# Patient Record
Sex: Male | Born: 1985
Health system: Southern US, Community
[De-identification: ages and names within clinical notes are randomized; demographics above are authoritative.]

## PROBLEM LIST (undated history)

## (undated) DIAGNOSIS — F419 Anxiety disorder, unspecified: Secondary | ICD-10-CM

## (undated) DIAGNOSIS — K219 Gastro-esophageal reflux disease without esophagitis: Secondary | ICD-10-CM

## (undated) DIAGNOSIS — R011 Cardiac murmur, unspecified: Secondary | ICD-10-CM

## (undated) DIAGNOSIS — I639 Cerebral infarction, unspecified: Secondary | ICD-10-CM

## (undated) HISTORY — DX: Gastro-esophageal reflux disease without esophagitis: K21.9

---

## 2009-07-27 ENCOUNTER — Ambulatory Visit: Payer: Self-pay | Admitting: Internal Medicine

## 2019-12-07 DIAGNOSIS — F4320 Adjustment disorder, unspecified: Secondary | ICD-10-CM | POA: Diagnosis not present

## 2019-12-14 DIAGNOSIS — F4320 Adjustment disorder, unspecified: Secondary | ICD-10-CM | POA: Diagnosis not present

## 2020-02-01 DIAGNOSIS — F4320 Adjustment disorder, unspecified: Secondary | ICD-10-CM | POA: Diagnosis not present

## 2020-02-13 DIAGNOSIS — F411 Generalized anxiety disorder: Secondary | ICD-10-CM | POA: Diagnosis not present

## 2020-02-13 DIAGNOSIS — F4322 Adjustment disorder with anxiety: Secondary | ICD-10-CM | POA: Diagnosis not present

## 2020-02-15 DIAGNOSIS — F4320 Adjustment disorder, unspecified: Secondary | ICD-10-CM | POA: Diagnosis not present

## 2020-02-20 DIAGNOSIS — F411 Generalized anxiety disorder: Secondary | ICD-10-CM | POA: Diagnosis not present

## 2020-02-20 DIAGNOSIS — F4322 Adjustment disorder with anxiety: Secondary | ICD-10-CM | POA: Diagnosis not present

## 2020-02-27 DIAGNOSIS — F411 Generalized anxiety disorder: Secondary | ICD-10-CM | POA: Diagnosis not present

## 2020-02-27 DIAGNOSIS — F4322 Adjustment disorder with anxiety: Secondary | ICD-10-CM | POA: Diagnosis not present

## 2020-02-29 DIAGNOSIS — F4320 Adjustment disorder, unspecified: Secondary | ICD-10-CM | POA: Diagnosis not present

## 2020-03-07 DIAGNOSIS — F4320 Adjustment disorder, unspecified: Secondary | ICD-10-CM | POA: Diagnosis not present

## 2020-03-12 DIAGNOSIS — F4322 Adjustment disorder with anxiety: Secondary | ICD-10-CM | POA: Diagnosis not present

## 2020-03-12 DIAGNOSIS — F411 Generalized anxiety disorder: Secondary | ICD-10-CM | POA: Diagnosis not present

## 2020-03-19 DIAGNOSIS — F4322 Adjustment disorder with anxiety: Secondary | ICD-10-CM | POA: Diagnosis not present

## 2020-03-19 DIAGNOSIS — F411 Generalized anxiety disorder: Secondary | ICD-10-CM | POA: Diagnosis not present

## 2020-03-26 DIAGNOSIS — F411 Generalized anxiety disorder: Secondary | ICD-10-CM | POA: Diagnosis not present

## 2020-03-26 DIAGNOSIS — F4322 Adjustment disorder with anxiety: Secondary | ICD-10-CM | POA: Diagnosis not present

## 2020-03-28 DIAGNOSIS — F4320 Adjustment disorder, unspecified: Secondary | ICD-10-CM | POA: Diagnosis not present

## 2020-04-02 DIAGNOSIS — F4322 Adjustment disorder with anxiety: Secondary | ICD-10-CM | POA: Diagnosis not present

## 2020-04-02 DIAGNOSIS — F411 Generalized anxiety disorder: Secondary | ICD-10-CM | POA: Diagnosis not present

## 2020-04-04 DIAGNOSIS — F4320 Adjustment disorder, unspecified: Secondary | ICD-10-CM | POA: Diagnosis not present

## 2020-04-09 DIAGNOSIS — F4322 Adjustment disorder with anxiety: Secondary | ICD-10-CM | POA: Diagnosis not present

## 2020-04-09 DIAGNOSIS — F411 Generalized anxiety disorder: Secondary | ICD-10-CM | POA: Diagnosis not present

## 2020-04-16 DIAGNOSIS — F4322 Adjustment disorder with anxiety: Secondary | ICD-10-CM | POA: Diagnosis not present

## 2020-04-16 DIAGNOSIS — F411 Generalized anxiety disorder: Secondary | ICD-10-CM | POA: Diagnosis not present

## 2020-04-18 DIAGNOSIS — F4320 Adjustment disorder, unspecified: Secondary | ICD-10-CM | POA: Diagnosis not present

## 2020-04-23 DIAGNOSIS — F411 Generalized anxiety disorder: Secondary | ICD-10-CM | POA: Diagnosis not present

## 2020-04-23 DIAGNOSIS — F4322 Adjustment disorder with anxiety: Secondary | ICD-10-CM | POA: Diagnosis not present

## 2020-04-25 DIAGNOSIS — F4320 Adjustment disorder, unspecified: Secondary | ICD-10-CM | POA: Diagnosis not present

## 2020-04-30 DIAGNOSIS — F411 Generalized anxiety disorder: Secondary | ICD-10-CM | POA: Diagnosis not present

## 2020-04-30 DIAGNOSIS — F4322 Adjustment disorder with anxiety: Secondary | ICD-10-CM | POA: Diagnosis not present

## 2020-05-02 DIAGNOSIS — F4320 Adjustment disorder, unspecified: Secondary | ICD-10-CM | POA: Diagnosis not present

## 2020-05-07 DIAGNOSIS — F411 Generalized anxiety disorder: Secondary | ICD-10-CM | POA: Diagnosis not present

## 2020-05-07 DIAGNOSIS — F4322 Adjustment disorder with anxiety: Secondary | ICD-10-CM | POA: Diagnosis not present

## 2020-05-07 DIAGNOSIS — Z23 Encounter for immunization: Secondary | ICD-10-CM | POA: Diagnosis not present

## 2020-05-09 DIAGNOSIS — F4320 Adjustment disorder, unspecified: Secondary | ICD-10-CM | POA: Diagnosis not present

## 2020-05-21 DIAGNOSIS — F4322 Adjustment disorder with anxiety: Secondary | ICD-10-CM | POA: Diagnosis not present

## 2020-05-21 DIAGNOSIS — F411 Generalized anxiety disorder: Secondary | ICD-10-CM | POA: Diagnosis not present

## 2020-05-28 DIAGNOSIS — F411 Generalized anxiety disorder: Secondary | ICD-10-CM | POA: Diagnosis not present

## 2020-05-28 DIAGNOSIS — F4322 Adjustment disorder with anxiety: Secondary | ICD-10-CM | POA: Diagnosis not present

## 2020-05-30 DIAGNOSIS — F4320 Adjustment disorder, unspecified: Secondary | ICD-10-CM | POA: Diagnosis not present

## 2020-06-06 DIAGNOSIS — S62316A Displaced fracture of base of fifth metacarpal bone, right hand, initial encounter for closed fracture: Secondary | ICD-10-CM | POA: Diagnosis not present

## 2020-07-18 DIAGNOSIS — F4320 Adjustment disorder, unspecified: Secondary | ICD-10-CM | POA: Diagnosis not present

## 2020-07-23 DIAGNOSIS — F411 Generalized anxiety disorder: Secondary | ICD-10-CM | POA: Diagnosis not present

## 2020-07-23 DIAGNOSIS — F4322 Adjustment disorder with anxiety: Secondary | ICD-10-CM | POA: Diagnosis not present

## 2020-08-07 DIAGNOSIS — F411 Generalized anxiety disorder: Secondary | ICD-10-CM | POA: Diagnosis not present

## 2020-08-07 DIAGNOSIS — F4322 Adjustment disorder with anxiety: Secondary | ICD-10-CM | POA: Diagnosis not present

## 2020-08-19 DIAGNOSIS — M9903 Segmental and somatic dysfunction of lumbar region: Secondary | ICD-10-CM | POA: Diagnosis not present

## 2020-08-19 DIAGNOSIS — M542 Cervicalgia: Secondary | ICD-10-CM | POA: Diagnosis not present

## 2020-08-19 DIAGNOSIS — M9901 Segmental and somatic dysfunction of cervical region: Secondary | ICD-10-CM | POA: Diagnosis not present

## 2020-08-19 DIAGNOSIS — M9902 Segmental and somatic dysfunction of thoracic region: Secondary | ICD-10-CM | POA: Diagnosis not present

## 2020-08-21 ENCOUNTER — Ambulatory Visit
Admission: EM | Admit: 2020-08-21 | Discharge: 2020-08-21 | Disposition: A | Discharge status: Home / Self Care | Payer: BC Managed Care – PPO | Attending: Emergency Medicine | Admitting: Emergency Medicine

## 2020-08-21 ENCOUNTER — Encounter: Payer: Self-pay | Admitting: Emergency Medicine

## 2020-08-21 ENCOUNTER — Other Ambulatory Visit: Payer: Self-pay

## 2020-08-21 DIAGNOSIS — B349 Viral infection, unspecified: Secondary | ICD-10-CM | POA: Diagnosis not present

## 2020-08-21 DIAGNOSIS — F411 Generalized anxiety disorder: Secondary | ICD-10-CM | POA: Diagnosis not present

## 2020-08-21 DIAGNOSIS — F4322 Adjustment disorder with anxiety: Secondary | ICD-10-CM | POA: Diagnosis not present

## 2020-08-21 NOTE — ED Provider Notes (Signed)
CHIEF COMPLAINT:   Chief Complaint  Patient presents with   Fever   Cough     SUBJECTIVE/HPI:   Fever Associated symptoms: cough   Cough Associated symptoms: fever   A very pleasant 35 y.o.Male presents today with cough and fever for the last 3 to 4 days.  Patient reports a 101 F temperature at home.  Patient states that on Sunday he smoked a cigar and has felt some throat irritation since.  Patient states that on Monday he had a mild headache, but states that yesterday his symptoms were better.  Patient states that on Monday and Tuesday he took at home COVID test which were negative.  Patient has used Sudafed and Tylenol with some relief of symptoms.  Patient reports that he has a 62-week-old baby at home and he is concerned for his symptoms. Patient does not report any shortness of breath, chest pain, palpitations, visual changes, weakness, tingling, headache, nausea, vomiting, diarrhea.   has no past medical history on file.  ROS:  Review of Systems  Constitutional:  Positive for fever.  Respiratory:  Positive for cough.   See Subjective/HPI Medications, Allergies and Problem List personally reviewed in Epic today OBJECTIVE:   Vitals:   08/21/20 1550  BP: 124/90  Pulse: 82  Resp: (!) 21  Temp: 98.5 F (36.9 C)  SpO2: 96%    Physical Exam   General: Appears well-developed and well-nourished. No acute distress.  HEENT Head: Normocephalic and atraumatic.   Ears: Hearing grossly intact, no drainage or visible deformity.  Nose: No nasal deviation.   Mouth/Throat: No stridor or tracheal deviation.  Mildly erythematous posterior pharynx noted with clear drainage present.  No white patchy exudate noted. Eyes: Conjunctivae and EOM are normal. No eye drainage or scleral icterus bilaterally.  Neck: Normal range of motion, neck is supple. No cervical, tonsillar or submandibular lymph nodes palpated.   Cardiovascular: Normal rate. Regular rhythm; no murmurs, gallops, or rubs.   Pulm/Chest: No respiratory distress. Breath sounds normal bilaterally without wheezes, rhonchi, or rales.  Neurological: Alert and oriented to person, place, and time.  Skin: Skin is warm and dry.  No rashes, lesions, abrasions or bruising noted to skin.   Psychiatric: Normal mood, affect, behavior, and thought content.   Vital signs and nursing note reviewed.   Patient stable and cooperative with examination. PROCEDURES:    LABS/X-RAYS/EKG/MEDS:   No results found for any visits on 08/21/20.  MEDICAL DECISION MAKING:   Patient presents with cough and fever for the last 3 to 4 days.  Patient reports a 101 F temperature at home.  Patient states that on Sunday he smoked a cigar and has felt some throat irritation since.  Patient states that on Monday he had a mild headache, but states that yesterday his symptoms were better.  Patient states that on Monday and Tuesday he took at home COVID test which were negative.  Patient has used Sudafed and Tylenol with some relief of symptoms.  Patient reports that he has a 8-week-old baby at home and he is concerned for his symptoms. Patient does not report any shortness of breath, chest pain, palpitations, visual changes, weakness, tingling, headache, nausea, vomiting, diarrhea.  Chart review completed.  Given symptoms along with assessment findings, likely viral illness.  COVID-19 PCR obtained in clinic today.  Advised the patient that we will call with any positive results and negative results will go directly to his MyChart.  Advised about home treatment and care as outlined in  his AVS.  Return as needed.  Patient verbalized understanding and agreed with treatment plan.  Patient stable upon discharge. ASSESSMENT/PLAN:  1. Viral illness - Novel Coronavirus, NAA ([facility]); Standing - Novel Coronavirus, NAA ([facility])  No orders of the defined types were placed in this encounter.   Instructions about new medications and side effects  provided.  Plan:   Discharge Instructions      We will call you with any positive results from your COVID-19 testing completed in clinic today.  If you do not receive a phone call from [place] within the next 2-3 days, check your MyChart for up-to-date health information related to testing completed in clinic today.   For most people this is a self-limiting process and can take anywhere from 7 - 10 days to start feeling better. A cough can last up to 3 weeks. Pay special attention to handwashing as this can help prevent the spread of the virus.   Always read the labels of cough and cold medications as they may contain some of the ingredients below.  Rest, push lots of fluids (especially water), and utilize supportive care for symptoms. You may take acetaminophen (Tylenol) every 4-6 hours and ibuprofen every 6-8 hours for muscle pain, joint pain, headaches (you may also alternate these medications). Mucinex (guaifenesin) may be taken over the counter for cough as needed can loosen phlegm. Please read the instructions and take as directed.  Sudafed (pseudophedrine) is sold behind the counter and can help reduce nasal pressure; avoid taking this if you have high blood pressure or feel jittery. Sudafed PE (phenylephrine) can be a helpful, short-term, over-the-counter alternative to limit side effects or if you have high blood pressure.  Flonase nasal spray can help alleviate congestion and sinus pressure. Many patients choose Afrin as a nasal decongestant; do not use for more than 3 days for risk of rebound (increased symptoms after stopping medication).  Saline nasal sprays or rinses can also help nasal congestion (use bottled or sterile water). Warm tea with lemon and honey can sooth sore throat and cough, as can cough drops.   Return to clinic for high fever not improving with medications, chest pain, difficulty breathing, non-stop vomiting, or coughing blood. Follow-up with your primary care  provider if symptoms do not improve as expected in the next 5-7 days.        A copy of these instructions have been given to the patient or responsible adult who demonstrated the ability to learn, asked appropriate questions, and verbalized understanding of the plan of care.  There were no barriers to learning identified.    Amalia Greenhouse, FNP-C 08/21/20  This note was partially made with the aid of speech-to-text dictation; typographical errors are not intentional.    Amalia Greenhouse, FNP 08/21/20 1639

## 2020-08-21 NOTE — Discharge Instructions (Addendum)

## 2020-08-21 NOTE — ED Triage Notes (Signed)
Patient c/o fever and productive cough w/ " clear to green" sputum  x 3-4 days.   Patient endorses a temperature of 101 F at home at it's highest.   Patient endorses onset of symptoms began after "smoking a cigar on Sunday and I hadn't done that in a while, then my throat started bothering me".   Patient reports taking 2 at home COVID test with negative test results.   Patient has taken Sudafed and Tylenol with some relief of symptoms.   Patient denies SOB.

## 2020-08-22 LAB — NOVEL CORONAVIRUS, NAA: SARS-CoV-2, NAA: NOT DETECTED

## 2020-08-22 LAB — SARS-COV-2, NAA 2 DAY TAT

## 2020-08-28 DIAGNOSIS — M542 Cervicalgia: Secondary | ICD-10-CM | POA: Diagnosis not present

## 2020-08-28 DIAGNOSIS — M9902 Segmental and somatic dysfunction of thoracic region: Secondary | ICD-10-CM | POA: Diagnosis not present

## 2020-08-28 DIAGNOSIS — M9903 Segmental and somatic dysfunction of lumbar region: Secondary | ICD-10-CM | POA: Diagnosis not present

## 2020-08-28 DIAGNOSIS — M9901 Segmental and somatic dysfunction of cervical region: Secondary | ICD-10-CM | POA: Diagnosis not present

## 2020-08-29 DIAGNOSIS — M9901 Segmental and somatic dysfunction of cervical region: Secondary | ICD-10-CM | POA: Diagnosis not present

## 2020-08-29 DIAGNOSIS — M9902 Segmental and somatic dysfunction of thoracic region: Secondary | ICD-10-CM | POA: Diagnosis not present

## 2020-08-29 DIAGNOSIS — M9903 Segmental and somatic dysfunction of lumbar region: Secondary | ICD-10-CM | POA: Diagnosis not present

## 2020-08-29 DIAGNOSIS — M542 Cervicalgia: Secondary | ICD-10-CM | POA: Diagnosis not present

## 2020-09-02 DIAGNOSIS — M542 Cervicalgia: Secondary | ICD-10-CM | POA: Diagnosis not present

## 2020-09-02 DIAGNOSIS — M9902 Segmental and somatic dysfunction of thoracic region: Secondary | ICD-10-CM | POA: Diagnosis not present

## 2020-09-02 DIAGNOSIS — M9901 Segmental and somatic dysfunction of cervical region: Secondary | ICD-10-CM | POA: Diagnosis not present

## 2020-09-02 DIAGNOSIS — M9903 Segmental and somatic dysfunction of lumbar region: Secondary | ICD-10-CM | POA: Diagnosis not present

## 2020-09-03 DIAGNOSIS — M9901 Segmental and somatic dysfunction of cervical region: Secondary | ICD-10-CM | POA: Diagnosis not present

## 2020-09-03 DIAGNOSIS — M9903 Segmental and somatic dysfunction of lumbar region: Secondary | ICD-10-CM | POA: Diagnosis not present

## 2020-09-03 DIAGNOSIS — M542 Cervicalgia: Secondary | ICD-10-CM | POA: Diagnosis not present

## 2020-09-03 DIAGNOSIS — M9902 Segmental and somatic dysfunction of thoracic region: Secondary | ICD-10-CM | POA: Diagnosis not present

## 2020-09-04 DIAGNOSIS — F4322 Adjustment disorder with anxiety: Secondary | ICD-10-CM | POA: Diagnosis not present

## 2020-09-04 DIAGNOSIS — F411 Generalized anxiety disorder: Secondary | ICD-10-CM | POA: Diagnosis not present

## 2020-09-05 DIAGNOSIS — M9903 Segmental and somatic dysfunction of lumbar region: Secondary | ICD-10-CM | POA: Diagnosis not present

## 2020-09-05 DIAGNOSIS — M9902 Segmental and somatic dysfunction of thoracic region: Secondary | ICD-10-CM | POA: Diagnosis not present

## 2020-09-05 DIAGNOSIS — S161XXA Strain of muscle, fascia and tendon at neck level, initial encounter: Secondary | ICD-10-CM | POA: Diagnosis not present

## 2020-09-05 DIAGNOSIS — M542 Cervicalgia: Secondary | ICD-10-CM | POA: Diagnosis not present

## 2020-09-05 DIAGNOSIS — M9901 Segmental and somatic dysfunction of cervical region: Secondary | ICD-10-CM | POA: Diagnosis not present

## 2020-09-06 DIAGNOSIS — M542 Cervicalgia: Secondary | ICD-10-CM | POA: Diagnosis not present

## 2020-09-09 ENCOUNTER — Inpatient Hospital Stay
Admission: EM | Admit: 2020-09-09 | Discharge: 2020-09-13 | DRG: 064 | Disposition: A | Discharge status: Rehabilitation Facility | Payer: BC Managed Care – PPO | Attending: Internal Medicine | Admitting: Internal Medicine

## 2020-09-09 ENCOUNTER — Other Ambulatory Visit: Payer: Self-pay

## 2020-09-09 ENCOUNTER — Emergency Department: Payer: BC Managed Care – PPO

## 2020-09-09 ENCOUNTER — Encounter: Payer: Self-pay | Admitting: Emergency Medicine

## 2020-09-09 DIAGNOSIS — I10 Essential (primary) hypertension: Secondary | ICD-10-CM | POA: Diagnosis not present

## 2020-09-09 DIAGNOSIS — I6503 Occlusion and stenosis of bilateral vertebral arteries: Secondary | ICD-10-CM | POA: Diagnosis not present

## 2020-09-09 DIAGNOSIS — R0602 Shortness of breath: Secondary | ICD-10-CM

## 2020-09-09 DIAGNOSIS — R270 Ataxia, unspecified: Secondary | ICD-10-CM | POA: Diagnosis not present

## 2020-09-09 DIAGNOSIS — Q278 Other specified congenital malformations of peripheral vascular system: Secondary | ICD-10-CM | POA: Diagnosis not present

## 2020-09-09 DIAGNOSIS — Z8679 Personal history of other diseases of the circulatory system: Secondary | ICD-10-CM | POA: Diagnosis not present

## 2020-09-09 DIAGNOSIS — R29818 Other symptoms and signs involving the nervous system: Secondary | ICD-10-CM | POA: Diagnosis not present

## 2020-09-09 DIAGNOSIS — K219 Gastro-esophageal reflux disease without esophagitis: Secondary | ICD-10-CM | POA: Diagnosis not present

## 2020-09-09 DIAGNOSIS — R079 Chest pain, unspecified: Secondary | ICD-10-CM | POA: Diagnosis not present

## 2020-09-09 DIAGNOSIS — I7774 Dissection of vertebral artery: Secondary | ICD-10-CM

## 2020-09-09 DIAGNOSIS — H5500 Unspecified nystagmus: Secondary | ICD-10-CM | POA: Diagnosis present

## 2020-09-09 DIAGNOSIS — E669 Obesity, unspecified: Secondary | ICD-10-CM | POA: Diagnosis present

## 2020-09-09 DIAGNOSIS — I119 Hypertensive heart disease without heart failure: Secondary | ICD-10-CM | POA: Diagnosis not present

## 2020-09-09 DIAGNOSIS — Z6836 Body mass index (BMI) 36.0-36.9, adult: Secondary | ICD-10-CM | POA: Diagnosis not present

## 2020-09-09 DIAGNOSIS — I639 Cerebral infarction, unspecified: Secondary | ICD-10-CM | POA: Diagnosis not present

## 2020-09-09 DIAGNOSIS — M50123 Cervical disc disorder at C6-C7 level with radiculopathy: Secondary | ICD-10-CM | POA: Diagnosis not present

## 2020-09-09 DIAGNOSIS — R519 Headache, unspecified: Secondary | ICD-10-CM | POA: Diagnosis not present

## 2020-09-09 DIAGNOSIS — Z20822 Contact with and (suspected) exposure to covid-19: Secondary | ICD-10-CM | POA: Diagnosis present

## 2020-09-09 DIAGNOSIS — D72829 Elevated white blood cell count, unspecified: Secondary | ICD-10-CM | POA: Diagnosis not present

## 2020-09-09 DIAGNOSIS — R200 Anesthesia of skin: Secondary | ICD-10-CM | POA: Diagnosis not present

## 2020-09-09 DIAGNOSIS — R001 Bradycardia, unspecified: Secondary | ICD-10-CM | POA: Diagnosis not present

## 2020-09-09 DIAGNOSIS — I517 Cardiomegaly: Secondary | ICD-10-CM | POA: Diagnosis not present

## 2020-09-09 DIAGNOSIS — F17290 Nicotine dependence, other tobacco product, uncomplicated: Secondary | ICD-10-CM | POA: Diagnosis present

## 2020-09-09 DIAGNOSIS — I6389 Other cerebral infarction: Secondary | ICD-10-CM | POA: Diagnosis not present

## 2020-09-09 DIAGNOSIS — I726 Aneurysm of vertebral artery: Secondary | ICD-10-CM | POA: Diagnosis not present

## 2020-09-09 DIAGNOSIS — I6502 Occlusion and stenosis of left vertebral artery: Secondary | ICD-10-CM

## 2020-09-09 DIAGNOSIS — I63212 Cerebral infarction due to unspecified occlusion or stenosis of left vertebral arteries: Secondary | ICD-10-CM | POA: Diagnosis not present

## 2020-09-09 DIAGNOSIS — G8324 Monoplegia of upper limb affecting left nondominant side: Secondary | ICD-10-CM | POA: Diagnosis not present

## 2020-09-09 DIAGNOSIS — G464 Cerebellar stroke syndrome: Secondary | ICD-10-CM

## 2020-09-09 DIAGNOSIS — H532 Diplopia: Secondary | ICD-10-CM | POA: Diagnosis not present

## 2020-09-09 DIAGNOSIS — R29702 NIHSS score 2: Secondary | ICD-10-CM | POA: Diagnosis present

## 2020-09-09 DIAGNOSIS — R42 Dizziness and giddiness: Secondary | ICD-10-CM | POA: Diagnosis not present

## 2020-09-09 DIAGNOSIS — R531 Weakness: Secondary | ICD-10-CM | POA: Diagnosis not present

## 2020-09-09 DIAGNOSIS — J329 Chronic sinusitis, unspecified: Secondary | ICD-10-CM | POA: Diagnosis not present

## 2020-09-09 HISTORY — DX: Dissection of vertebral artery: I77.74

## 2020-09-09 HISTORY — DX: Cerebral infarction, unspecified: I63.9

## 2020-09-09 LAB — TROPONIN I (HIGH SENSITIVITY): Troponin I (High Sensitivity): 3 ng/L (ref <18)

## 2020-09-09 LAB — HIV ANTIBODY (ROUTINE TESTING W REFLEX): HIV Screen 4th Generation wRfx: NONREACTIVE

## 2020-09-09 LAB — COMPREHENSIVE METABOLIC PANEL
ALT: 15 U/L (ref 0–44)
AST: 15 U/L (ref 15–41)
Albumin: 4.1 g/dL (ref 3.5–5.0)
Alkaline Phosphatase: 72 U/L (ref 38–126)
Anion gap: 8 (ref 5–15)
BUN: 20 mg/dL (ref 6–20)
CO2: 26 mmol/L (ref 22–32)
Calcium: 9 mg/dL (ref 8.9–10.3)
Chloride: 106 mmol/L (ref 98–111)
Creatinine, Ser: 0.85 mg/dL (ref 0.61–1.24)
GFR, Estimated: >60 mL/min (ref >60)
Glucose, Bld: 144 mg/dL — ABNORMAL HIGH (ref 70–99)
Potassium: 3.8 mmol/L (ref 3.5–5.1)
Sodium: 140 mmol/L (ref 135–145)
Total Bilirubin: 0.9 mg/dL (ref 0.3–1.2)
Total Protein: 7.2 g/dL (ref 6.5–8.1)

## 2020-09-09 LAB — CBC
HCT: 49.1 % (ref 39.0–52.0)
Hemoglobin: 17 g/dL (ref 13.0–17.0)
MCH: 29.7 pg (ref 26.0–34.0)
MCHC: 34.6 g/dL (ref 30.0–36.0)
MCV: 85.8 fL (ref 80.0–100.0)
Platelets: 327 10*3/uL (ref 150–400)
RBC: 5.72 MIL/uL (ref 4.22–5.81)
RDW: 12.1 % (ref 11.5–15.5)
WBC: 18.4 10*3/uL — ABNORMAL HIGH (ref 4.0–10.5)
nRBC: 0 % (ref 0.0–0.2)

## 2020-09-09 LAB — URINALYSIS, COMPLETE (UACMP) WITH MICROSCOPIC
Bacteria, UA: NONE SEEN
Bilirubin Urine: NEGATIVE
Glucose, UA: NEGATIVE mg/dL
Hgb urine dipstick: NEGATIVE
Ketones, ur: NEGATIVE mg/dL
Leukocytes,Ua: NEGATIVE
Nitrite: NEGATIVE
Protein, ur: NEGATIVE mg/dL
Specific Gravity, Urine: 1.032 — ABNORMAL HIGH (ref 1.005–1.030)
pH: 7 (ref 5.0–8.0)

## 2020-09-09 LAB — CBG MONITORING, ED
Glucose-Capillary: 150 mg/dL — ABNORMAL HIGH (ref 70–99)
Glucose-Capillary: 129 mg/dL — ABNORMAL HIGH (ref 70–99)

## 2020-09-09 LAB — HEMOGLOBIN A1C
Hgb A1c MFr Bld: 5.5 % (ref 4.8–5.6)
Mean Plasma Glucose: 111.15 mg/dL

## 2020-09-09 IMAGING — MR MR HEAD W/O CM
13 series · 48 of 48 positions shown · non-contrast
Comparison: None.

CLINICAL DATA: Neuro deficit, acute, stroke suspected; dizziness
and weakness after chiropractor visit; abnormal CTA

EXAM:
MRI HEAD WITHOUT CONTRAST
TECHNIQUE: Multiplanar, multiecho pulse sequences of the brain and surrounding
structures were obtained without intravenous contrast.

[Series 5: ax dwi_tracew · axial · 3.0mm · 1.80mm/px · z∈[-75,+80]mm · 2 of 48 slices shown]
[im 1/48]
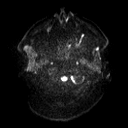
[im 48/48]
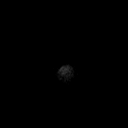

[Series 6: ax dwi_adc · axial · 3.0mm · 1.80mm/px · z∈[-75,+80]mm · 2 of 48 slices shown]
[im 1/48]
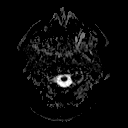
[im 48/48]
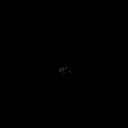

[Series 7: cor dwi_tracew · coronal · 5.0mm · 1.80mm/px · 3 of 38 slices shown]
[im 1/38]
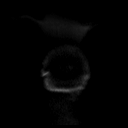
[im 19/38]
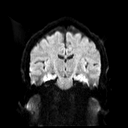
[im 38/38]
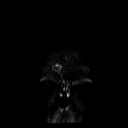

[Series 8: cor dwi_adc · coronal · 5.0mm · 1.80mm/px · 3 of 38 slices shown]
[im 1/38]
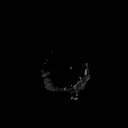
[im 19/38]
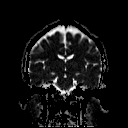
[im 38/38]
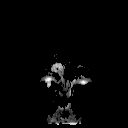

[Series 9: T1 · sagittal · 5.0mm · 0.62mm/px · 2 of 25 slices shown (1 of 2)]
[im 1/25]
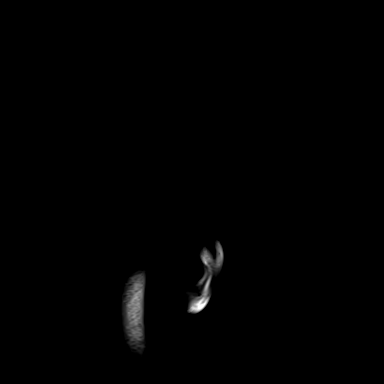
[im 25/25]
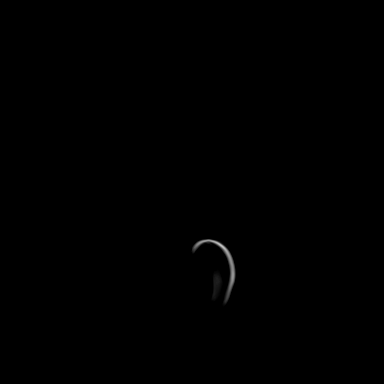

[Series 10: T2 · axial · 5.0mm · 0.53mm/px · z∈[-71,+72]mm · 2 of 25 slices shown (1 of 2)]
[im 1/25]
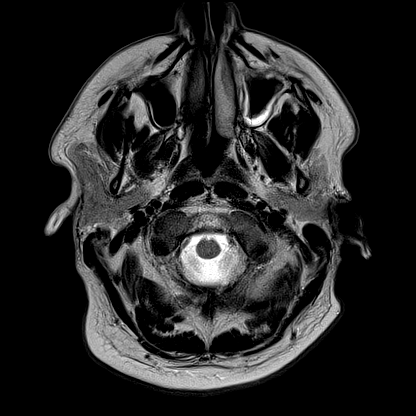
[im 25/25]
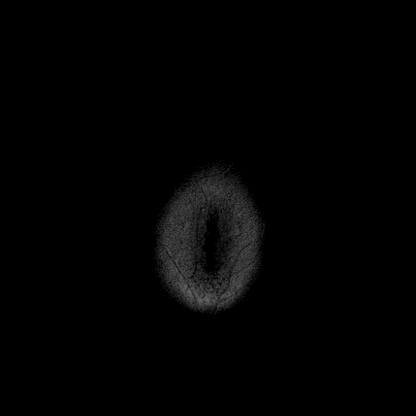

[Series 11: mag_images · axial · 3.0mm · 0.90mm/px · z∈[-86,+89]mm · 4 of 60 slices shown]
[im 1/60]
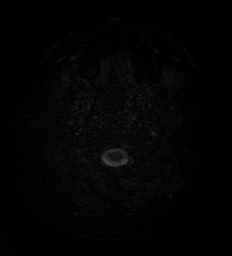
[im 20/60]
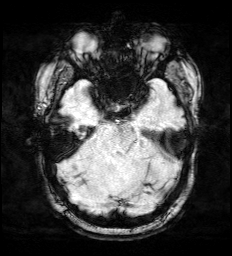
[im 40/60]
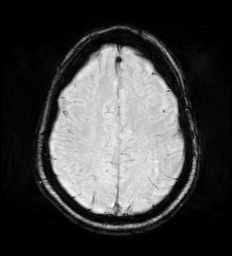
[im 60/60]
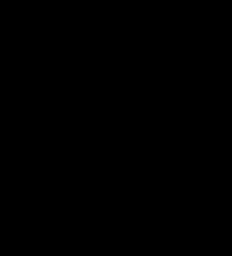

[Series 12: pha_images · axial · 3.0mm · 0.90mm/px · z∈[-86,+89]mm · 4 of 60 slices shown]
[im 1/60]
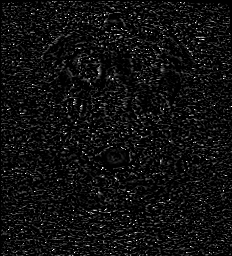
[im 20/60]
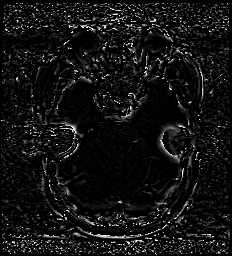
[im 40/60]
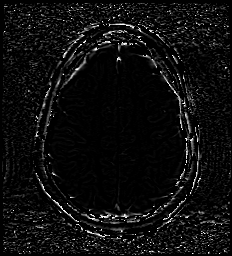
[im 60/60]
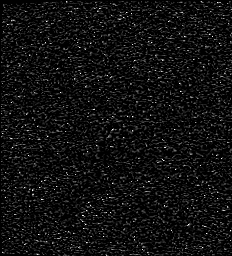

[Series 13: swi_images · axial · 3.0mm · 0.90mm/px · z∈[-86,+89]mm · 4 of 60 slices shown]
[im 1/60]
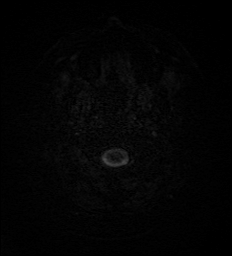
[im 20/60]
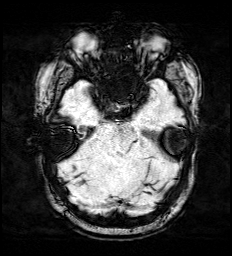
[im 40/60]
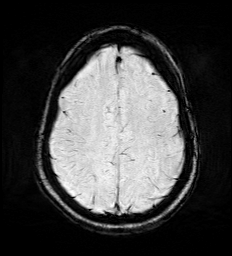
[im 60/60]
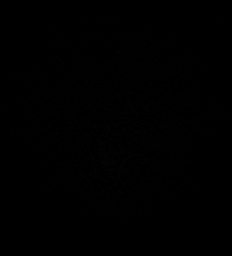

[Series 14: mip_images(sw) · axial · 24.0mm · 0.90mm/px · z∈[-76,+79]mm · 4 of 53 slices shown]
[im 1/53]
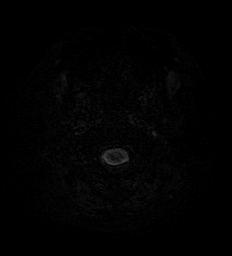
[im 18/53]
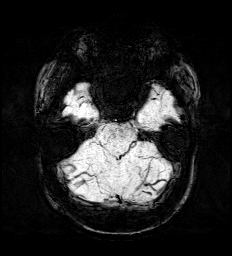
[im 35/53]
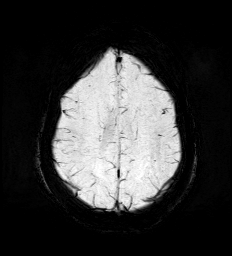
[im 53/53]
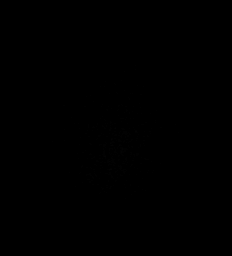

[Series 15: FLAIR · axial · 3.0mm · 0.53mm/px · z∈[-80,+81]mm · 4 of 55 slices shown]
[im 1/55]
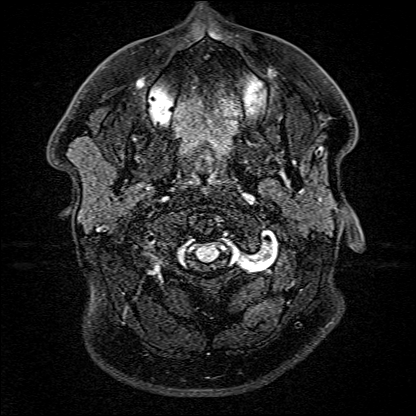
[im 19/55]
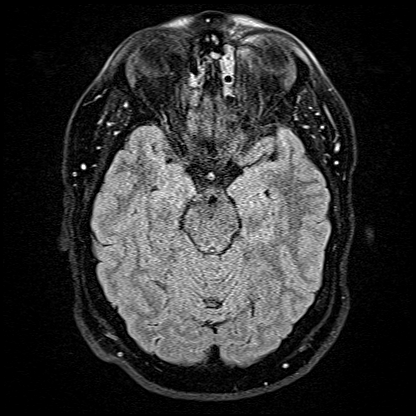
[im 37/55]
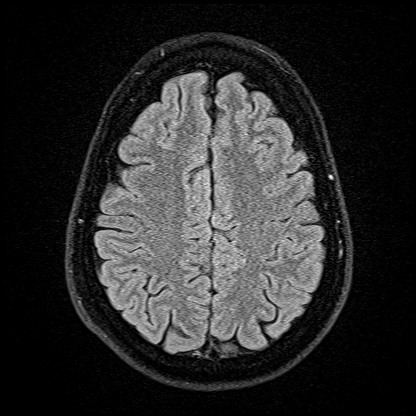
[im 55/55]
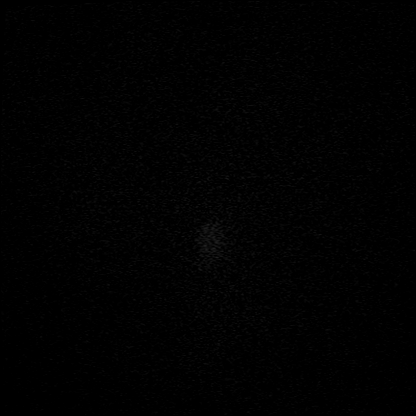

[Series 16: T1 · axial · 1.0mm · 0.98mm/px · z∈[-92,+81]mm · 12 of 172 slices shown (2 of 2)]
[im 1/172]
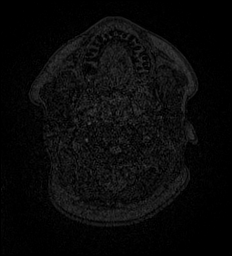
[im 16/172]
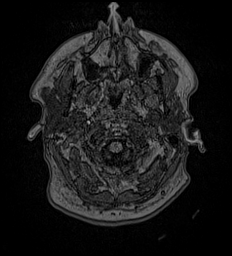
[im 32/172]
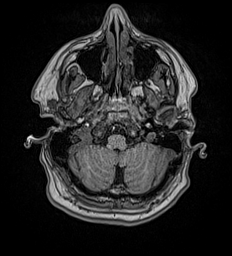
[im 47/172]
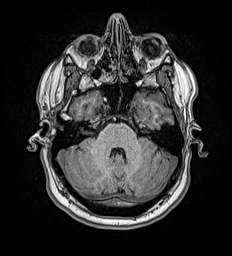
[im 63/172]
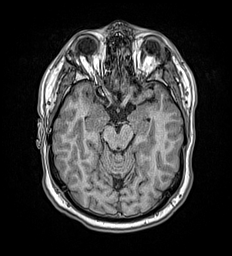
[im 78/172]
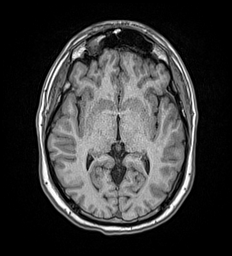
[im 94/172]
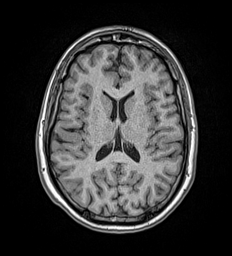
[im 109/172]
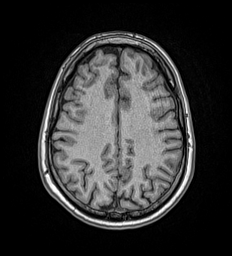
[im 125/172]
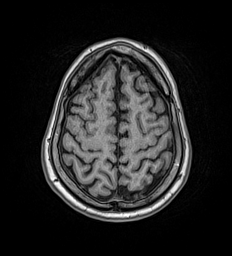
[im 140/172]
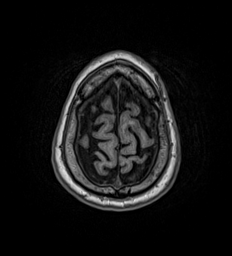
[im 156/172]
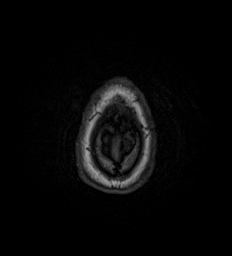
[im 172/172]
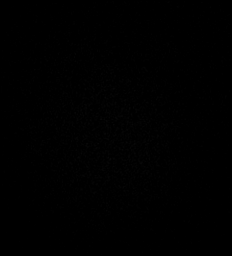

[Series 17: T2 · coronal · 5.0mm · 0.45mm/px · 2 of 31 slices shown (2 of 2)]
[im 1/31]
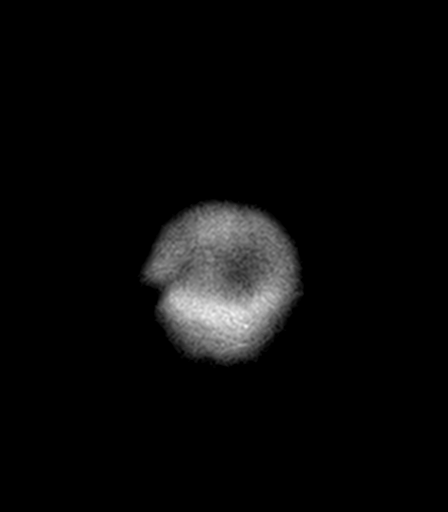
[im 31/31]
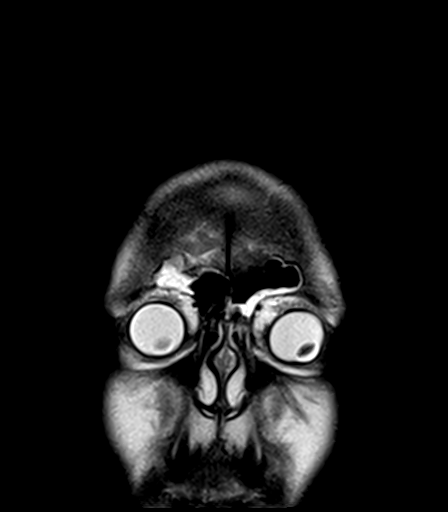

[48 of 48 positions shown; findings below may reference images not displayed]

FINDINGS: Brain: Focal reduced diffusion at the left lateral aspect of the
medulla. Ventricles and sulci are normal in size and configuration.
A few small foci of T2 hyperintensity in the cerebral white matter
likely reflect nonspecific gliosis/demyelination of doubtful
clinical significance. There is no evidence of intracranial
hemorrhage. There is no intracranial mass, mass effect, or edema.
There is no hydrocephalus or extra-axial fluid collection.

Vascular: Major vessel flow voids at the skull base are preserved.

Skull and upper cervical spine: Normal marrow signal is preserved.

Sinuses/Orbits: Paranasal sinuses are aerated. Orbits are
unremarkable.

Other: Sella is unremarkable.  Mastoid air cells are clear.
IMPRESSION: Small acute stroke of the lateral left medulla.

These results were called by telephone at the time of interpretation
on [DATE] at [DATE] to provider JINIA , who verbally
acknowledged these results.

## 2020-09-09 IMAGING — CT CT ANGIO HEAD-NECK (W OR W/O PERF)
2 of 10 series · 7 of 35 positions shown · IV contrast (omnipaque)
Comparison: None

CLINICAL DATA: Provided history: Dizziness, persistent/recurrent,
cardiac or vascular cause suspected; left sided neck pain s/p
chiropractic adjustment with HA.

EXAM:
CT ANGIOGRAPHY HEAD AND NECK
TECHNIQUE: Multidetector CT imaging of the head and neck was performed using
the standard protocol during bolus administration of intravenous
contrast. Multiplanar CT image reconstructions and MIPs were
obtained to evaluate the vascular anatomy. Carotid stenosis
measurements (when applicable) are obtained utilizing NASCET
criteria, using the distal internal carotid diameter as the
denominator.
CONTRAST:  75mL OMNIPAQUE IOHEXOL 350 MG/ML SOLN

[Series 7: ax thin · axial · 0.50mm/px · z∈[-58,+196]mm · 6 of 357 slices shown]
[im 51/357  soft-tissue]
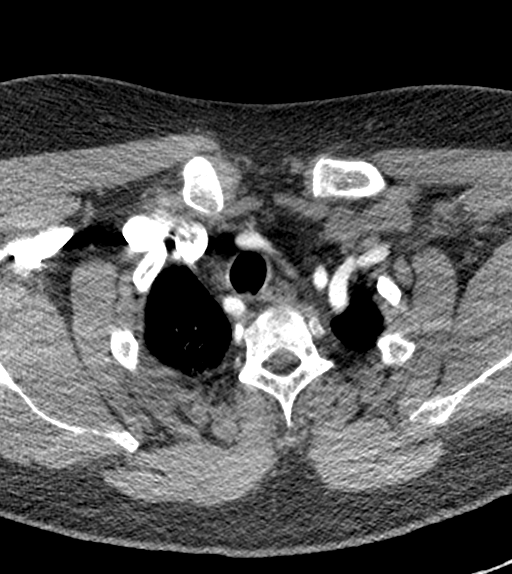
[im 102/357  bone]
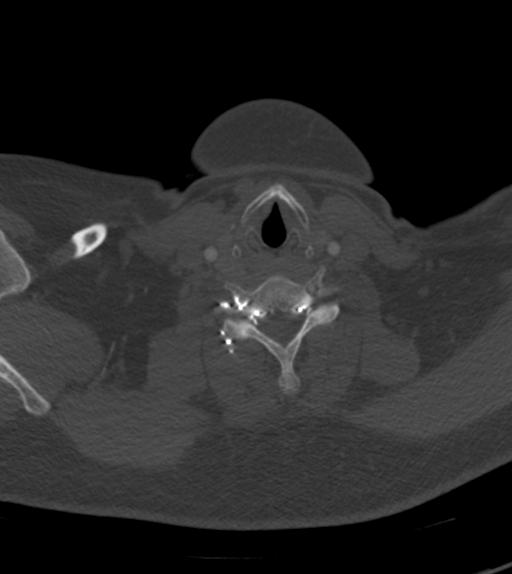
[im 153/357  soft-tissue]
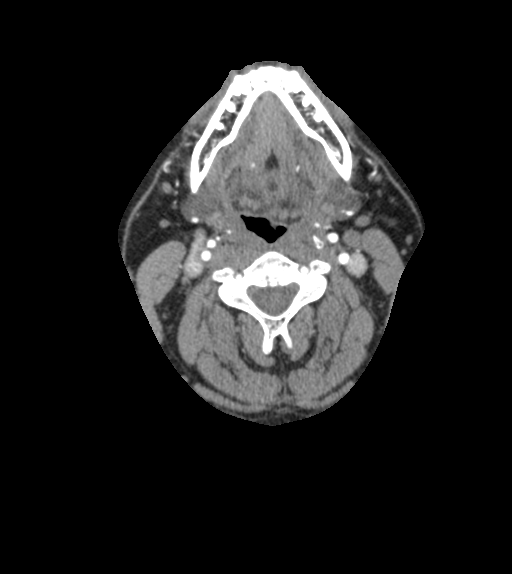
[im 204/357  bone]
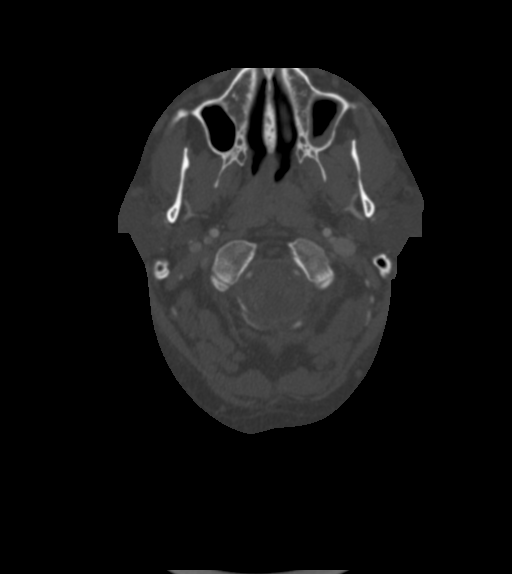
[im 255/357  soft-tissue]
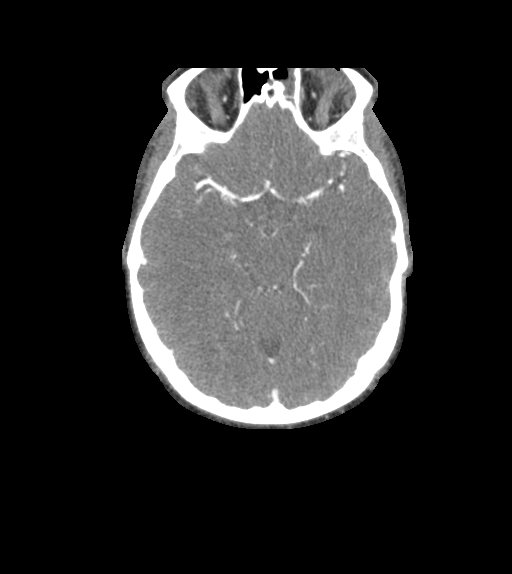
[im 306/357  bone]
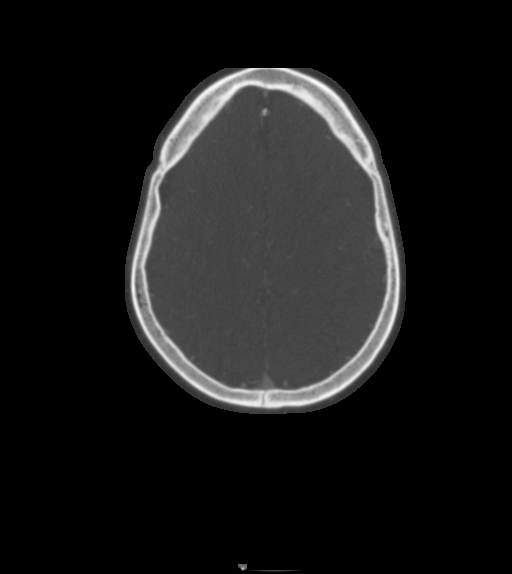

[Series 9: sagittal thin · sagittal · 0.56mm/px · 1 of 267 slices shown]
[im 127/267  soft-tissue]
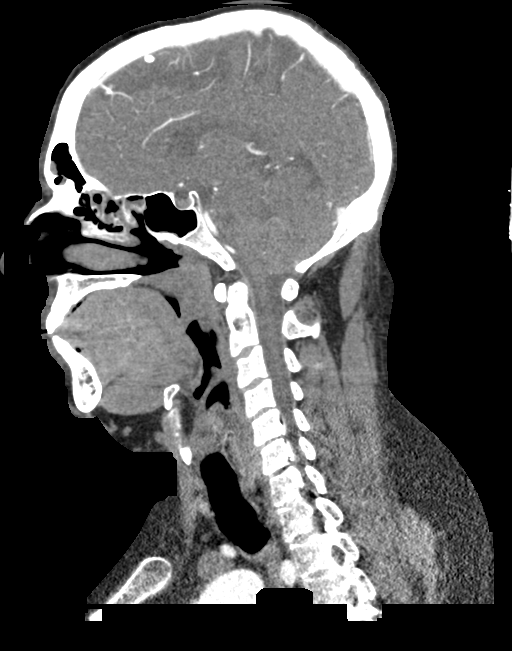

[7 of 35 positions shown; findings below may reference images not displayed]

FINDINGS: CT HEAD FINDINGS

Brain:

Cerebral volume is normal.

There is no acute intracranial hemorrhage.

No demarcated cortical infarct.

No extra-axial fluid collection.

No evidence of an intracranial mass.

No midline shift.

Vascular: No hyperdense vessel.

Skull: Normal. Negative for fracture or focal lesion.

Sinuses: Moderate partial opacification of the right frontal sinus.
Mild-to-moderate mucosal thickening within the left frontal sinus.
Moderate partial opacification of the bilateral ethmoid air cells,
likely due to the presence of mucosal thickening and fluid. Mild
left maxillary sinus mucosal thickening.

Orbits: No mass or acute finding.

Review of the MIP images confirms the above findings

CTA NECK FINDINGS

Aortic arch: Aberrant right subclavian artery. The origin of the
right subclavian artery is incompletely included in the field of
view. The visualized aortic arch is normal in caliber. No
hemodynamically significant stenosis within the proximal subclavian
arteries.

Right carotid system: CCA and ICA patent within the neck without
stenosis or significant atherosclerotic disease. No evidence of
dissection.

Left carotid system: CCA and ICA patent within the neck without
stenosis or significant atherosclerotic disease. No evidence of
dissection

Vertebral arteries: Venous contrast reflux limits evaluation of the
V1 and proximal V2 segments of the right vertebral artery. Within
this limitation, the right vertebral artery is patent within the
neck without stenosis or evidence of dissection. The left vertebral
artery is patent within the neck. However, there are sites of
irregularity and mild to moderate narrowing within the V2 left
vertebral artery highly suspicious for sequela of dissection given
the provided history (for instance as seen on series 7, image 137).

Skeleton: No acute bony abnormality or aggressive osseous lesion.

Other neck: No neck mass or cervical lymphadenopathy.

Upper chest: No consolidation within the imaged lung apices.

Review of the MIP images confirms the above findings

CTA HEAD FINDINGS

Anterior circulation:

The intracranial internal carotid arteries are patent. The M1 middle
cerebral arteries are patent. No M2 proximal branch occlusion or
high-grade proximal stenosis is identified. The anterior cerebral
arteries are patent. No intracranial aneurysm is identified.

Posterior circulation:

The intracranial right vertebral artery is patent without stenosis.
There is irregularity and sites of up to moderate/severe narrowing
within the V4 left vertebral artery, highly suspicious for
dissection given the provided history (for instance as seen on
series 7, images 211 and 212) (series 8, images 131 and 132). The
basilar artery is patent. The posterior cerebral arteries are
patent. The posterior communicating arteries are hypoplastic or
absent bilaterally.

Venous sinuses: Within the limitations of contrast timing, no
convincing thrombus.

Anatomic variants: As described.

Review of the MIP images confirms the above findings

These results were called by telephone at the time of interpretation
on [DATE] at [DATE] to provider EMIR , who verbally
acknowledged these results.
IMPRESSION: CT head:

1. No evidence of acute intracranial abnormality.
2. Paranasal sinus disease, as described. Correlate for acute
sinusitis.

CTA head/neck:

1. Irregularity of the V2 segment of the left vertebral artery with
sites of up to mild/moderate stenosis. Irregularity of the V4 left
vertebral artery with sites of up to moderate/severe stenosis. These
findings are highly suspicious for left vertebral artery dissection
given the provided history. A brain MRI is recommended to assess for
acute intracranial ischemia.
2. Elsewhere within the head and neck, there is no large vessel
occlusion or proximal high-grade arterial stenosis.
3. Aberrant right subclavian artery. The right subclavian artery
origin is incompletely included in the field of view.

## 2020-09-09 IMAGING — MR MR CERVICAL SPINE W/O CM
5 series · 41 of 48 positions shown · non-contrast
Comparison: None.

CLINICAL DATA: Cervical radiculopathy caudal right flecks; numbness
face and neck after chiropractor adjustment

EXAM:
MRI CERVICAL SPINE WITHOUT CONTRAST
TECHNIQUE: Multiplanar, multisequence MR imaging of the cervical spine was
performed. No intravenous contrast was administered.

[Series 5: T2 · sagittal · 3.0mm · 0.62mm/px · 6 of 15 slices shown (1 of 2)]
[im 1/15]
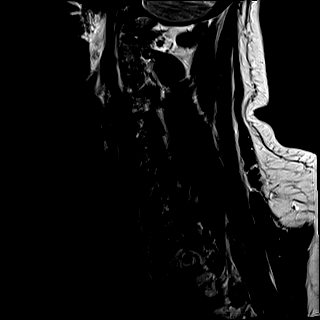
[im 3/15]
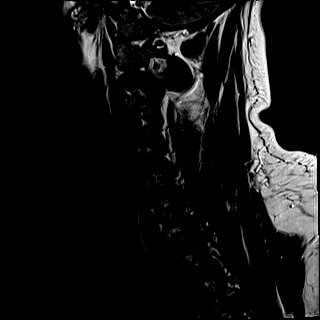
[im 6/15]
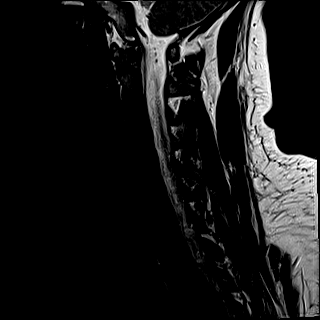
[im 9/15]
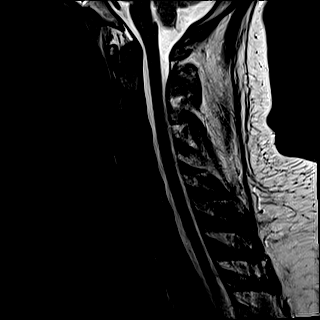
[im 12/15]
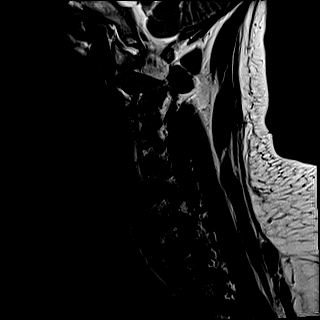
[im 15/15]
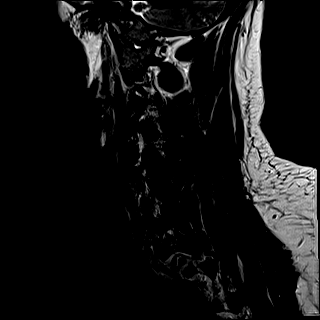

[Series 6: FLAIR · sagittal · 3.0mm · 0.78mm/px · 7 of 15 slices shown]
[im 1/15]
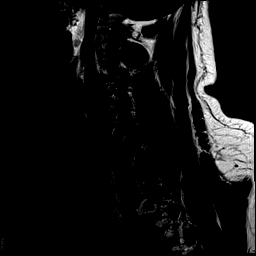
[im 3/15]
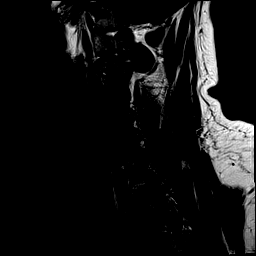
[im 5/15]
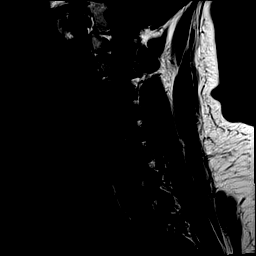
[im 8/15]
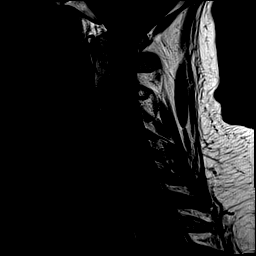
[im 10/15]
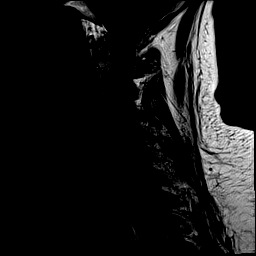
[im 12/15]
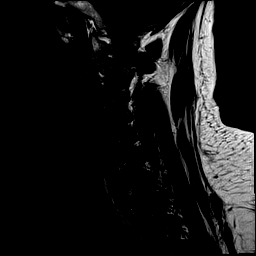
[im 15/15]
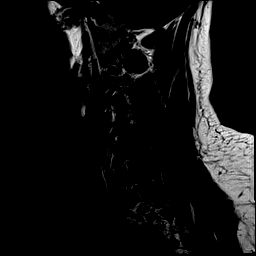

[Series 7: STIR · sagittal · 3.0mm · 0.62mm/px · 7 of 15 slices shown]
[im 1/15]
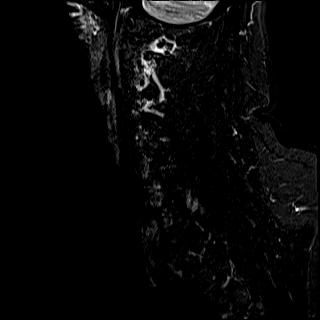
[im 3/15]
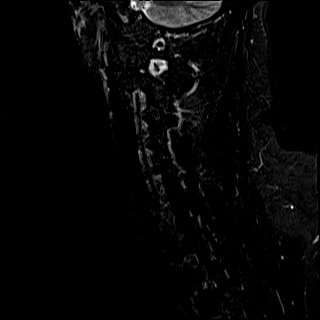
[im 5/15]
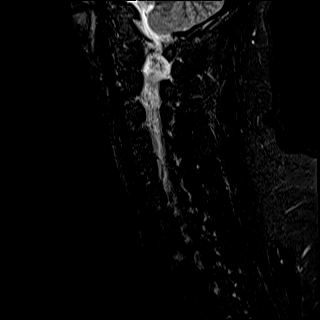
[im 8/15]
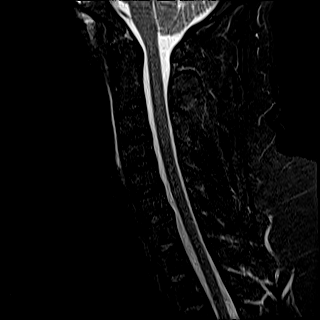
[im 10/15]
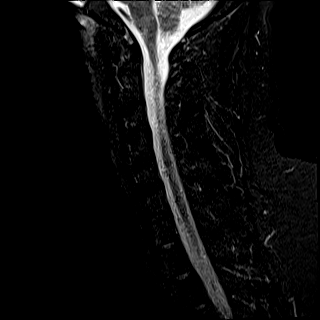
[im 12/15]
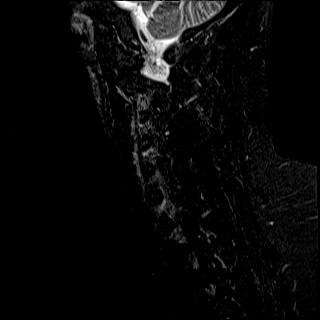
[im 15/15]
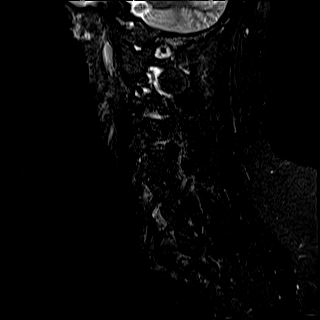

[Series 8: T2 · axial · 3.0mm · 0.70mm/px · z∈[-232,-128]mm · 13 of 32 slices shown (2 of 2)]
[im 1/32]
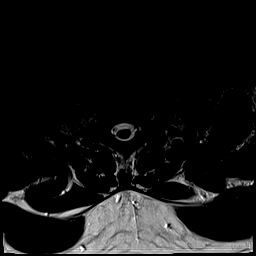
[im 3/32]
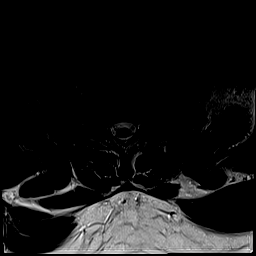
[im 5/32]
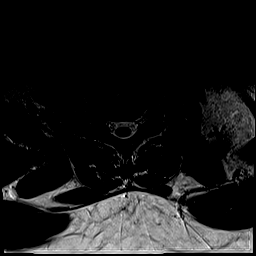
[im 8/32]
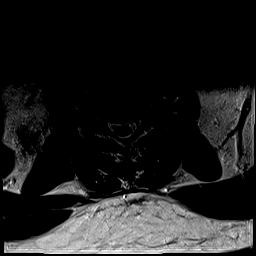
[im 10/32]
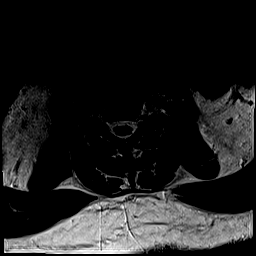
[im 12/32]
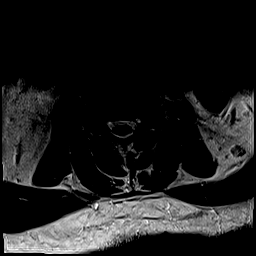
[im 15/32]
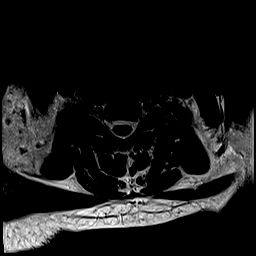
[im 17/32]
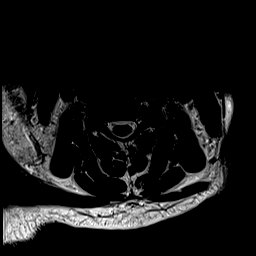
[im 20/32]
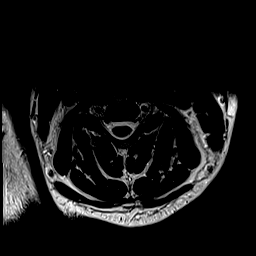
[im 22/32]
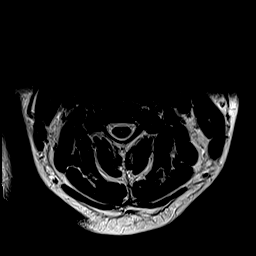
[im 24/32]
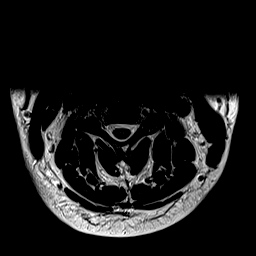
[im 27/32]
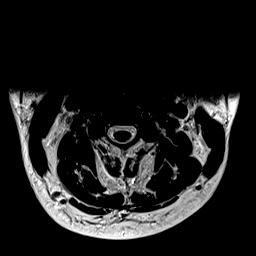
[im 32/32]
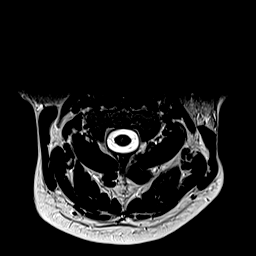

[Series 9: ax mpgr · axial · 3.0mm · 0.35mm/px · z∈[-232,-128]mm · 8 of 32 slices shown]
[im 1/32]
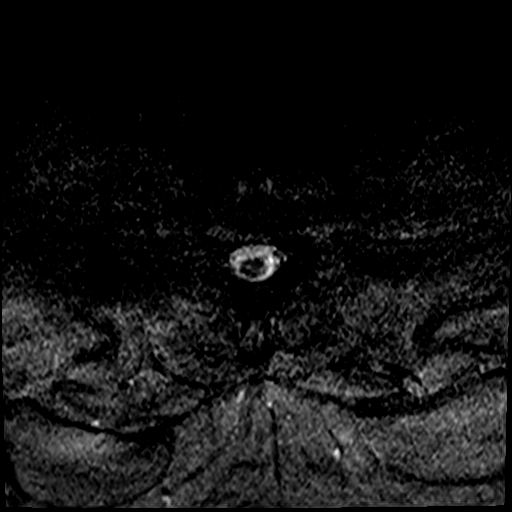
[im 5/32]
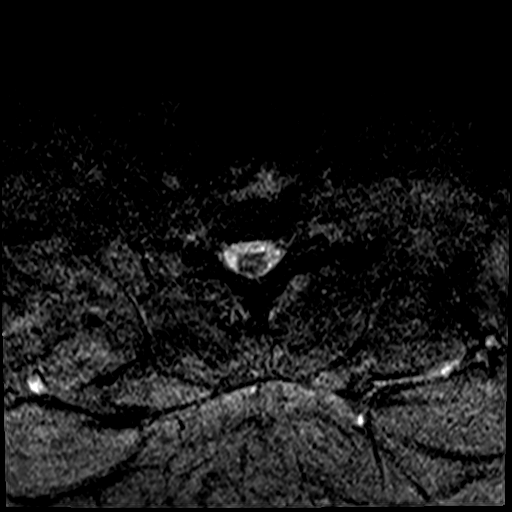
[im 10/32]
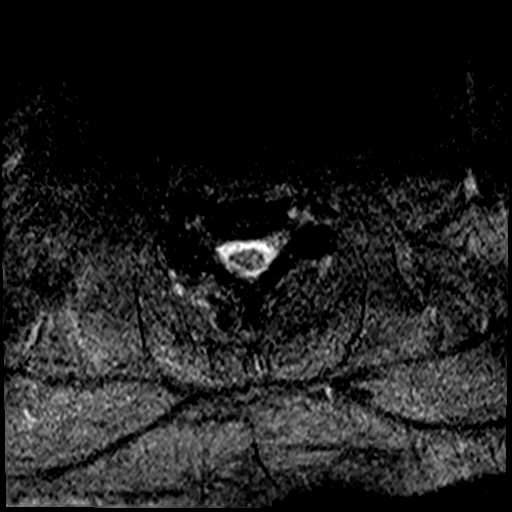
[im 15/32]
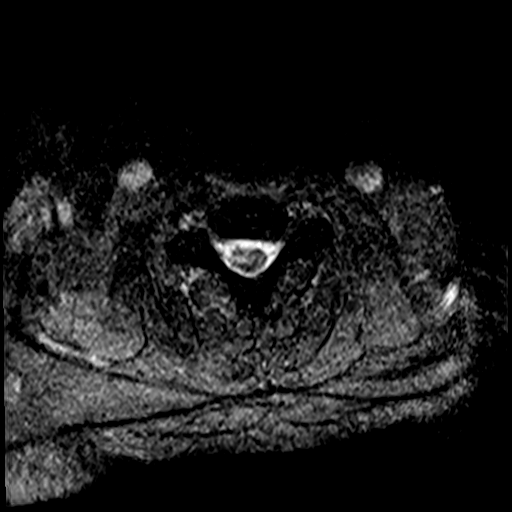
[im 17/32]
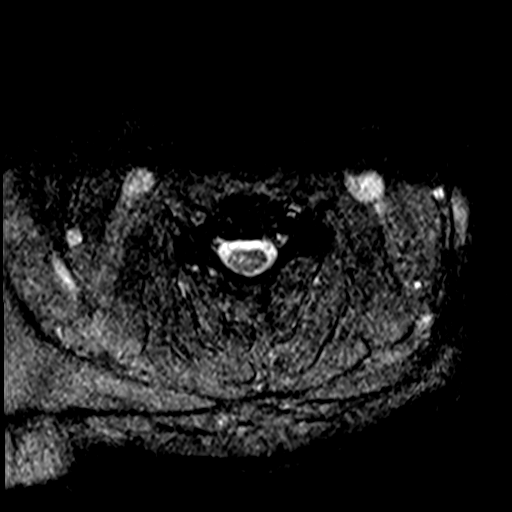
[im 22/32]
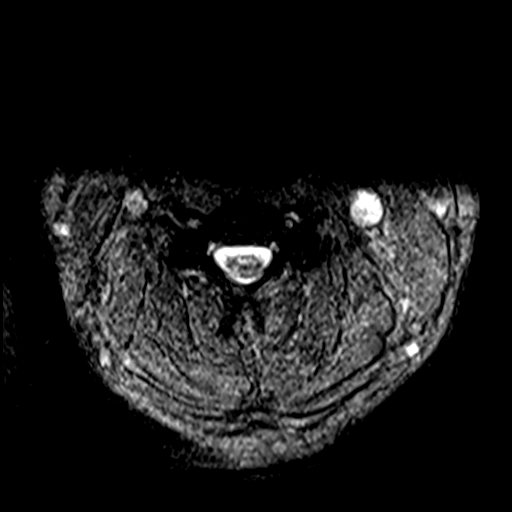
[im 27/32]
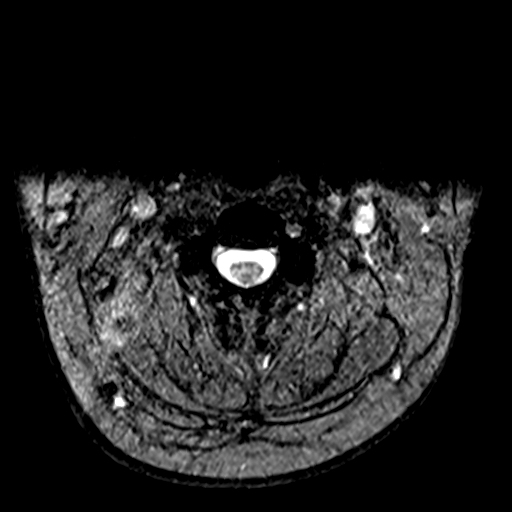
[im 32/32]
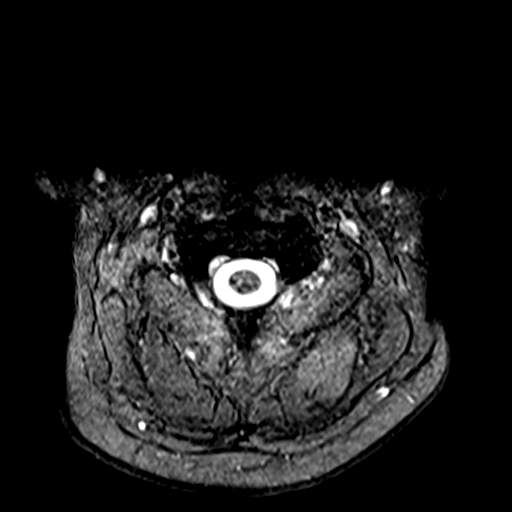

[41 of 48 positions shown; findings below may reference images not displayed]

FINDINGS: Alignment: No significant listhesis.

Vertebrae: Vertebral body heights are maintained. No marrow edema.
No suspicious osseous lesion

Cord: Normal caliber and signal.

Posterior Fossa, vertebral arteries, paraspinal tissues: Possible
crescentic abnormal signal is present about the left V2 vertebral
artery at the C3-C4 to C5 levels. May reflect subintimal hematoma of
dissection seen on CTA. Otherwise unremarkable.

Disc levels: Minimal central protrusion at C6-C7. There is no canal
or foraminal stenosis at any level.
IMPRESSION: No abnormal cord signal.  No canal or foraminal stenosis.

## 2020-09-09 MED ORDER — PANTOPRAZOLE SODIUM 40 MG PO TBEC
40.0000 mg | DELAYED_RELEASE_TABLET | Freq: Two times a day (BID) | ORAL | Status: DC
  Administered 2020-09-09 – 2020-09-13 (×8): 40 mg via ORAL
  Filled 2020-09-09 (×8): qty 1

## 2020-09-09 MED ORDER — MECLIZINE HCL 25 MG PO TABS: 25.0000 mg | ORAL_TABLET | Freq: Three times a day (TID) | ORAL | Status: DC | PRN

## 2020-09-09 MED ORDER — STROKE: EARLY STAGES OF RECOVERY BOOK: Freq: Once | Status: AC

## 2020-09-09 MED ORDER — MECLIZINE HCL 25 MG PO TABS
25.0000 mg | ORAL_TABLET | Freq: Once | ORAL | Status: AC
  Administered 2020-09-09: 25 mg via ORAL
  Filled 2020-09-09: qty 1

## 2020-09-09 MED ORDER — ONDANSETRON HCL 4 MG/2ML IJ SOLN
4.0000 mg | Freq: Four times a day (QID) | INTRAMUSCULAR | Status: AC | PRN
  Administered 2020-09-09 – 2020-09-10 (×2): 4 mg via INTRAVENOUS
  Filled 2020-09-09 (×2): qty 2

## 2020-09-09 MED ORDER — ALUM & MAG HYDROXIDE-SIMETH 200-200-20 MG/5ML PO SUSP
30.0000 mL | ORAL | Status: DC | PRN
  Administered 2020-09-10 – 2020-09-12 (×5): 30 mL via ORAL
  Filled 2020-09-09 (×4): qty 30

## 2020-09-09 MED ORDER — GABAPENTIN 300 MG PO CAPS
300.0000 mg | ORAL_CAPSULE | Freq: Three times a day (TID) | ORAL | Status: DC | PRN
  Administered 2020-09-11: 300 mg via ORAL
  Filled 2020-09-09: qty 1

## 2020-09-09 MED ORDER — CLOPIDOGREL BISULFATE 75 MG PO TABS
300.0000 mg | ORAL_TABLET | Freq: Once | ORAL | Status: AC
  Administered 2020-09-09: 300 mg via ORAL
  Filled 2020-09-09: qty 4

## 2020-09-09 MED ORDER — ASPIRIN EC 81 MG PO TBEC
81.0000 mg | DELAYED_RELEASE_TABLET | Freq: Every day | ORAL | Status: DC
  Administered 2020-09-10 – 2020-09-13 (×4): 81 mg via ORAL
  Filled 2020-09-09 (×4): qty 1

## 2020-09-09 MED ORDER — SODIUM CHLORIDE 0.9 % IV SOLN
12.5000 mg | Freq: Four times a day (QID) | INTRAVENOUS | Status: DC | PRN
  Administered 2020-09-09: 12.5 mg via INTRAVENOUS
  Filled 2020-09-09: qty 12.5
  Filled 2020-09-09: qty 0.5

## 2020-09-09 MED ORDER — DEXAMETHASONE SODIUM PHOSPHATE 10 MG/ML IJ SOLN
10.0000 mg | Freq: Once | INTRAMUSCULAR | Status: AC
  Administered 2020-09-09: 10 mg via INTRAVENOUS
  Filled 2020-09-09: qty 1

## 2020-09-09 MED ORDER — MECLIZINE HCL 25 MG PO TABS
50.0000 mg | ORAL_TABLET | Freq: Three times a day (TID) | ORAL | Status: AC | PRN
  Administered 2020-09-10 – 2020-09-11 (×4): 50 mg via ORAL
  Filled 2020-09-09 (×6): qty 2

## 2020-09-09 MED ORDER — INSULIN ASPART 100 UNIT/ML IJ SOLN
0.0000 [IU] | Freq: Three times a day (TID) | INTRAMUSCULAR | Status: DC
  Administered 2020-09-09: 2 [IU] via SUBCUTANEOUS
  Filled 2020-09-09: qty 1

## 2020-09-09 MED ORDER — CLOPIDOGREL BISULFATE 75 MG PO TABS
75.0000 mg | ORAL_TABLET | Freq: Every day | ORAL | Status: DC
  Administered 2020-09-10 – 2020-09-13 (×4): 75 mg via ORAL
  Filled 2020-09-09 (×4): qty 1

## 2020-09-09 MED ORDER — ACETAMINOPHEN 325 MG PO TABS
650.0000 mg | ORAL_TABLET | ORAL | Status: DC | PRN
  Administered 2020-09-10 – 2020-09-12 (×6): 650 mg via ORAL
  Filled 2020-09-09 (×6): qty 2

## 2020-09-09 MED ORDER — DIPHENHYDRAMINE HCL 50 MG/ML IJ SOLN
25.0000 mg | Freq: Once | INTRAMUSCULAR | Status: AC
  Administered 2020-09-09: 25 mg via INTRAVENOUS
  Filled 2020-09-09: qty 1

## 2020-09-09 MED ORDER — LORAZEPAM 2 MG/ML IJ SOLN
0.5000 mg | Freq: Once | INTRAMUSCULAR | Status: AC
  Administered 2020-09-09: 0.5 mg via INTRAVENOUS
  Filled 2020-09-09: qty 1

## 2020-09-09 MED ORDER — INSULIN ASPART 100 UNIT/ML IJ SOLN: 0.0000 [IU] | Freq: Every day | INTRAMUSCULAR | Status: DC

## 2020-09-09 MED ORDER — ACETAMINOPHEN 160 MG/5ML PO SOLN
650.0000 mg | ORAL | Status: DC | PRN
  Filled 2020-09-09: qty 20.3

## 2020-09-09 MED ORDER — ONDANSETRON HCL 4 MG/2ML IJ SOLN
4.0000 mg | Freq: Once | INTRAMUSCULAR | Status: AC
  Administered 2020-09-09: 4 mg via INTRAVENOUS
  Filled 2020-09-09: qty 2

## 2020-09-09 MED ORDER — SODIUM CHLORIDE 0.9 % IV SOLN
1000.0000 mL | Freq: Once | INTRAVENOUS | Status: AC
  Administered 2020-09-09: 1000 mL via INTRAVENOUS

## 2020-09-09 MED ORDER — ACETAMINOPHEN 650 MG RE SUPP: 650.0000 mg | RECTAL | Status: DC | PRN

## 2020-09-09 MED ORDER — PROMETHAZINE HCL 25 MG/ML IJ SOLN
12.5000 mg | Freq: Four times a day (QID) | INTRAMUSCULAR | Status: DC | PRN
Start: 2020-09-09 — End: 2020-09-09
  Administered 2020-09-09: 12.5 mg via INTRAVENOUS
  Filled 2020-09-09: qty 12.5
  Filled 2020-09-09: qty 0.5

## 2020-09-09 MED ORDER — LORAZEPAM 2 MG/ML IJ SOLN
0.5000 mg | Freq: Once | INTRAMUSCULAR | Status: AC | PRN
  Administered 2020-09-09: 0.5 mg via INTRAVENOUS
  Filled 2020-09-09: qty 1

## 2020-09-09 MED ORDER — HYDRALAZINE HCL 20 MG/ML IJ SOLN: 2.0000 mg | INTRAMUSCULAR | Status: DC | PRN

## 2020-09-09 MED ORDER — HYDRALAZINE HCL 20 MG/ML IJ SOLN: 2.0000 mg | INTRAMUSCULAR | Status: AC | PRN

## 2020-09-09 MED ORDER — ASPIRIN 81 MG PO CHEW
325.0000 mg | CHEWABLE_TABLET | Freq: Once | ORAL | Status: AC
  Administered 2020-09-09: 325 mg via ORAL
  Filled 2020-09-09: qty 5

## 2020-09-09 MED ORDER — IOHEXOL 350 MG/ML SOLN
75.0000 mL | Freq: Once | INTRAVENOUS | Status: AC | PRN
  Administered 2020-09-09: 75 mL via INTRAVENOUS

## 2020-09-09 MED ORDER — SENNOSIDES-DOCUSATE SODIUM 8.6-50 MG PO TABS: 1.0000 | ORAL_TABLET | Freq: Every evening | ORAL | Status: DC | PRN

## 2020-09-09 NOTE — ED Provider Notes (Signed)
Patient was instructed to provide urine sample in bed into urinal but opted to try to stand up, fell to the floor apparently slowly.  Denies injuries.  Reassuring exam, no indication for imaging at this time.   Jene Every, MD 09/09/20 1151

## 2020-09-09 NOTE — ED Notes (Signed)
Pt given urinal by this RN at report of needing to pee. Pt asked if they could stand to pee, pt still reporting "severe dizziness" and "room spinning". This RN instructed pt and wife for pt to stay in bed and use urinal and to not stand.

## 2020-09-09 NOTE — ED Notes (Signed)
This RN witnessed pt have an episode of emesis moderate amount.

## 2020-09-09 NOTE — Progress Notes (Signed)
PT Cancellation Note  Patient Details Name: Curtis [name] MRN: [id number] DOB: 1985-12-24   Cancelled Treatment:    Reason Eval/Treat Not Completed: Other (comment). Consult received,  chart reviewed. Pt pending further medical workup (MRI results pending). PT to re-attempt as able depending on medical appropriateness.  Olga Coaster PT, DPT 1:05 PM,09/09/20

## 2020-09-09 NOTE — ED Notes (Signed)
Wife at bedside, all pt's belongings within reach on side table that is directly beside pt. Call bell within reach. Both side rails up.

## 2020-09-09 NOTE — ED Notes (Signed)
Wt 270lbs Ht 6'

## 2020-09-09 NOTE — Consult Note (Signed)
NEUROLOGY CONSULTATION NOTE   Date of service: September 09, 2020 Patient Name: Curtis [name] MRN:  [id number] DOB:  1985/10/03 Reason for consult: vertebral dissection Requesting physician: Jene Every MD _ _ _   _ __   _ __ _ _  __ __   _ __   __ _  History of Present Illness   Curtis [name] is a 35 yo man with hx cervicalgia who presented with acute onset nausea and dizziness since 0500 today. He has been seeing a chiropractor for neck pain and being treated with cervical manipulation tiw x2 weeks. This morning he developed acute onset nausea, dizziness, severe headache at 0500 and wife brought him to ED. CTA shows irregularity L V2 with mild/mod stenosis and LV 4 with mod/severe stenosis c/f vertebral dissection. On exam he has ataxia on L FNF and difficulty walking. Also reports numbness of his R hand. He had a fall to ground without head trauma in ED today when he stood up to give urine sample. He has trouble focusing his eyes and it feels like his vision is "bouncing around."  MRI brain showed small ischemic infarct in the L lateral medulla.    ROS   Per HPI; all other systems reviewed and are negative.  Past History   History reviewed. No pertinent past medical history. History reviewed. No pertinent surgical history. History reviewed. No pertinent family history. Social History   Socioeconomic History  . Marital status: Married    Spouse name: Not on file  . Number of children: Not on file  . Years of education: Not on file  . Highest education level: Not on file  Occupational History  . Not on file  Tobacco Use  . Smoking status: Some Days    Types: Cigars  . Smokeless tobacco: Never  Substance and Sexual Activity  . Alcohol use: Yes    Alcohol/week: 2.0 standard drinks    Types: 2 Shots of liquor per week  . Drug use: Never  . Sexual activity: Not on file  Other Topics Concern  . Not on file  Social History Narrative  . Not on file   Social Determinants  of Health   Financial Resource Strain: Not on file  Food Insecurity: Not on file  Transportation Needs: Not on file  Physical Activity: Not on file  Stress: Not on file  Social Connections: Not on file   No Known Allergies  Medications   (Not in a hospital admission)    Vitals   Vitals:   09/09/20 0900 09/09/20 1015 09/09/20 1047 09/09/20 1136  BP: (!) 143/92 (!) 126/115 (!) 154/99 (!) 145/91  Pulse: (!) 50 (!) 51 61 (!) 59  Resp: 15 18 17    Temp:      TempSrc:      SpO2: 98% 99% 100% 94%  Weight:      Height:         Body mass index is 36.62 kg/m.  Physical Exam   Physical Exam Gen: A&O x4, NAD HEENT: Atraumatic, normocephalic;mucous membranes moist; oropharynx clear, tongue without atrophy or fasciculations. Neck: Supple, trachea midline. Resp: CTAB, no w/r/r CV: RRR, no m/g/r; nml S1 and S2. 2+ symmetric peripheral pulses. Abd: soft/NT/ND; nabs x 4 quad Extrem: Nml bulk; no cyanosis, clubbing, or edema.  Neuro: *MS: A&O x4. Follows multi-step commands.  *Speech: fluid, nondysarthric, able to name and repeat *CN:    I: Deferred   II,III: PERRLA, VFF by confrontation, optic discs not visualized 2/2  pupillary constriction   III,IV,VI: EOMI w/ nystagmus on L lateral gaze, no ptosis   V: Sensation intact from V1 to V3 to LT   VII: Eyelid closure was full.  Smile symmetric.   VIII: Hearing intact to voice   IX,X: Voice normal, palate elevates symmetrically    XI: SCM/trap 5/5 bilat   XII: Tongue protrudes midline, no atrophy or fasciculations   *Motor:   Normal bulk.  No tremor, rigidity or bradykinesia. No pronator drift.    Strength: Dlt Bic Tri WrE WrF FgS Gr HF KnF KnE PlF DoF    Left 5 5 5 5 5 5 5 5 5 5 5 5     Right 5 5 5 5 5 5 5 5 5 5 5 5     *Sensory: Intact to light touch, pinprick, temperature vibration throughout. Symmetric. Propioception intact bilat.  No double-simultaneous extinction.  *Coordination:  ataxia on FNF and HTS on L side  only *Reflexes:  2+ and symmetric throughout without clonus; toes down-going bilat *Gait: deferred. Did have fall wo head trauma earlier when he stood up to give a urine sample  NIHSS = 2 for ataxia at 1130 on 09/09/20   Labs   CBC:  Recent Labs  Lab 09/09/20 0758  WBC 18.4*  HGB 17.0  HCT 49.1  MCV 85.8  PLT 327    Basic Metabolic Panel:  Lab Results  Component Value Date   NA 140 09/09/2020   K 3.8 09/09/2020   CO2 26 09/09/2020   GLUCOSE 144 (H) 09/09/2020   BUN 20 09/09/2020   CREATININE 0.85 09/09/2020   CALCIUM 9.0 09/09/2020   GFRNONAA >60 09/09/2020   Lipid Panel: No results found for: LDLCALC HgbA1c: No results found for: HGBA1C Urine Drug Screen: No results found for: LABOPIA, COCAINSCRNUR, LABBENZ, AMPHETMU, THCU, LABBARB  Alcohol Level No results found for: [facility]   Impression   35 yo man with 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 His vertigo, nystagmus (causing "bouncing vision" sensation and difficulty focusing), and nausea are secondary to the stroke in this location.   Recommendations   - Patient to be admitted to hospitalist service - DAPT x3 mos. ASA 325mg  now f/v 81mg  daily + plavix 300mg  now f/b 75mg  daily after that. - At 3 mos patient should have repeat vascular imaging to determine need to continue DAPT. - Avoid chiropractic manipulation, contact sports, any any activity that involves abrupt rotation and flexion-extension of neck - Permissive HTN up to SBP 180 x48 hrs; prn hydralazine or labetalol for BP outside of parameters. Avoid hypotension.  - Check LDL and A1c for overall stroke risk factor modification - Gapapentin 300mg  tid for headache - Prn zofran and meclizine for nausea and vertigo, respectively - Will continue to follow  [facility], MD [facility] [phone]  If 7pm- 7am, please page neurology on call as listed in  [facility].   ______________________________________________________________________   Thank you for the opportunity to take part in the care of this patient. If you have any further questions, please contact the neurology consultation attending.  Signed,  , MD [facility] [phone]  If 7pm- 7am, please page neurology on call as listed in [facility].

## 2020-09-09 NOTE — ED Notes (Signed)
This RN and RN Heather sitting at desk across from this pt's room. This pt's wife came out of room and said they needed help because this pt fell. When this RN entered room pt was awake and alert laying on the floor at the edge of the bed. This RN asked what happened since pt and wife were instructed that pt needed to stay in bed, both side rails were still up at this time. Wife stated she assisted pt out of bed because he didn't want to pee sitting up and then pt became dizzy and fell. MD at bedside at this time evaluating pt post fall.

## 2020-09-09 NOTE — ED Notes (Signed)
Pt reported an episode of emesis.

## 2020-09-09 NOTE — ED Triage Notes (Signed)
First RN Note: Pt to ED via [facility] from home, per EMS pt took a dose of Flexeril at 2230pm and again at 0500, per EMS pt began having L sided numbness and nausea, per EMS pt dry heaving en route. Per EMS pt ambulatory on scene.    156/98 54HR 98% RA

## 2020-09-09 NOTE — ED Notes (Signed)
Pt taken to CT.

## 2020-09-09 NOTE — ED Notes (Signed)
Pt reports they became dizzy and "slowly slid down to floor". Pt shows no signs of serious injury, no noticeable abrasions on extremities, head, or back. VS stable.

## 2020-09-09 NOTE — ED Notes (Signed)
Pt reports lips numb, throat feels like its swelling, rt arm numbness > left arm numbess both both are numb. Pt is restless and fidgety. Nausea and dizziness increase when turning to left.  MD made aware.

## 2020-09-09 NOTE — ED Notes (Signed)
Pt back from MRI at this time. Reconnected to monitoring equipment, call bell within reach, pt's shoes still on, all belongings on side table within reach. Yellow armband applied to pt at this time. Urinal within reach.

## 2020-09-09 NOTE — ED Triage Notes (Addendum)
Pt arrived via [facility] from home with reports of taking dose of flexeril around 930pm. Pt states he took another dose at 0500 this morning and began having L side numbness along with nausea. PT states the numbness was immediately after taking the medication.   Pt dry heaving in triage room. Pt is diaphoretic and clammy in triage.  Pt also pale, c/o dizziness.  Denies any PMH. Pt taking flexeril for neck pain.

## 2020-09-09 NOTE — ED Notes (Signed)
Pt taken to MRI  

## 2020-09-09 NOTE — ED Notes (Signed)
Pt is vomiting after transport to room. RN made aware.

## 2020-09-09 NOTE — ED Notes (Signed)
MD Cox, MD [facility] messaged for patient's request for medication for "reflux"

## 2020-09-09 NOTE — ED Provider Notes (Signed)
[facility] Emergency Department Provider Note   ____________________________________________    I have reviewed the triage vital signs and the nursing notes.   HISTORY  Chief Complaint Weakness     HPI Curtis [name] is a 35 y.o. male who presents with complaints of dizziness, diffuse weakness, nausea and vomiting.  Patient reports approximately 2 weeks ago he went to the chiropractor for "adjustment ".  Prior to the visit he was not having any neck pain.  Afterwards he had significant left-sided neck pain with left-sided headache.  He went to the orthopedist 3 days ago and was given steroid pack and muscle relaxers, he reports this is helped somewhat.  He did take a muscle relaxer last night and again in the middle of the night and shortly thereafter developed diffuse weakness lightheadedness nausea and vomiting.  He denies neurodeficits but does complain of dizziness currently which is severe  History reviewed. No pertinent past medical history.  There are no problems to display for this patient.   History reviewed. No pertinent surgical history.  Prior to Admission medications   Medication Sig Start Date End Date Taking? Authorizing Provider  cyclobenzaprine (FLEXERIL) 5 MG tablet Take 5 mg by mouth 3 (three) times daily as needed. 09/05/20  Yes [provider]  methylPREDNISolone (MEDROL DOSEPAK) 4 MG TBPK tablet Take 1-6 tablets by mouth daily. Take 6 tablets on day 1 as directed on package and decrease by 1 tab each day for a total of 6 days 09/05/20 09/10/20 Yes [provider]     Allergies Patient has no known allergies.  History reviewed. No pertinent family history.  Social History Social History   Tobacco Use   Smoking status: Some Days    Types: Cigars   Smokeless tobacco: Never  Substance Use Topics   Alcohol use: Yes    Alcohol/week: 2.0 standard drinks    Types: 2 Shots of liquor per week   Drug use: Never     Review of Systems  Constitutional: No fever/chills Eyes: No visual changes.  ENT: No sore throat. Cardiovascular: Denies chest pain. Respiratory: Denies shortness of breath. Gastrointestinal: No abdominal pain.  No nausea, no vomiting.   Genitourinary: Negative for dysuria. Musculoskeletal: As above Skin: Negative for rash. Neurological: As above   ____________________________________________   PHYSICAL EXAM:  VITAL SIGNS: ED Triage Vitals  Enc Vitals Group     BP 09/09/20 0631 (!) 147/85     Pulse Rate 09/09/20 0631 (!) 50     Resp 09/09/20 0631 18     Temp 09/09/20 0631 97.8 F (36.6 C)     Temp Source 09/09/20 0631 Oral     SpO2 09/09/20 0631 96 %     Weight 09/09/20 0631 122.5 kg (270 lb)     Height 09/09/20 0631 1.829 m (6')     Head Circumference --      Peak Flow --      Pain Score 09/09/20 0645 0     Pain Loc --      Pain Edu? --      Excl. in GC? --     Constitutional: Alert and oriented. Eyes: Conjunctivae are normal.  Head: Atraumatic. Nose: No congestion/rhinnorhea. Mouth/Throat: Mucous membranes are moist.   Neck: No vertebral tenderness to palpation, no bruit Cardiovascular: Normal rate, regular rhythm.  Good peripheral circulation. Respiratory: Normal respiratory effort.  No retractions.  Gastrointestinal: Soft and nontender. No distention.    Musculoskeletal: No lower extremity  tenderness nor edema.  Warm and well perfused, normal strength in all extremities Neurologic:  Normal speech and language. No gross focal neurologic deficits are appreciated.  Cranial nerves II through XII appear normal Skin:  Skin is warm, dry and intact. No rash noted. Psychiatric: Mood and affect are normal. Speech and behavior are normal.  ____________________________________________   LABS (all labs ordered are listed, but only abnormal results are displayed)  Labs Reviewed  CBC - Abnormal; Notable for the following components:      Result Value   WBC  18.4 (*)    All other components within normal limits  COMPREHENSIVE METABOLIC PANEL - Abnormal; Notable for the following components:   Glucose, Bld 144 (*)    All other components within normal limits  CBG MONITORING, ED - Abnormal; Notable for the following components:   Glucose-Capillary 150 (*)    All other components within normal limits  URINALYSIS, COMPLETE (UACMP) WITH MICROSCOPIC  TROPONIN I (HIGH SENSITIVITY)   ____________________________________________  EKG  ED ECG REPORT I, Jene Every, the attending physician, personally viewed and interpreted this ECG.  Date: 09/09/2020  Rhythm: Sinus bradycardia QRS Axis: normal Intervals: normal ST/T Wave abnormalities: normal Narrative Interpretation: no evidence of acute ischemia  ____________________________________________  RADIOLOGY  CT angiography head and neck reviewed by me ____________________________________________   PROCEDURES  Procedure(s) performed: No  Procedures   Critical Care performed: yes  CRITICAL CARE Performed by: Jene Every   Total critical care time: 30 minutes  Critical care time was exclusive of separately billable procedures and treating other patients.  Critical care was necessary to treat or prevent imminent or life-threatening deterioration.  Critical care was time spent personally by me on the following activities: development of treatment plan with patient and/or surrogate as well as nursing, discussions with consultants, evaluation of patient's response to treatment, examination of patient, obtaining history from patient or surrogate, ordering and performing treatments and interventions, ordering and review of laboratory studies, ordering and review of radiographic studies, pulse oximetry and re-evaluation of patient's condition.  ____________________________________________   INITIAL IMPRESSION / ASSESSMENT AND PLAN / ED COURSE  Pertinent labs & imaging results that  were available during my care of the patient were reviewed by me and considered in my medical decision making (see chart for details).   Patient presents with dizziness, nausea and vomiting as detailed above.  Could be medication reaction/side effect related to muscle relaxer that he took prior to this developing.  However neck pain status post chiropractic adjustment concerning for possible carotid dissection especially given headache that he had.  We will treat with IV Zofran, IV fluids, check labs, obtain CT angiography of the head and neck  Contacted by radiology and notified of stenosis of the vertebral artery on the left at multiple locations consistent with likely prior dissection  After CT patient complained of numbness of his lips top and bottom.  Given Decadron and Benadryl although no erythema hives or itching to suggest allergic reaction  Consulted neurology, who saw the patient in the ED, recommends MRI brain and hospital admission.  Consulted hospitalist for admission    ____________________________________________   FINAL CLINICAL IMPRESSION(S) / ED DIAGNOSES  Final diagnoses:  Vertebral artery dissection (HCC)        Note:  This document was prepared using Dragon voice recognition software and may include unintentional dictation errors.    Jene Every, MD 09/09/20 1153

## 2020-09-09 NOTE — H&P (Signed)
History and Physical   AHMAAD [name] NTI:[id number] DOB: 01-02-1986 DOA: 09/09/2020  PCP: Pcp, No  Patient coming from: Home via EMS  I have personally briefly reviewed patient's old medical records in [place].  Chief Concern: Left-sided headache, numbness, nausea  HPI: Curtis [name] is a 35 y.o. male with medical history significant for obesity, presents emergency department for chief concerns of left-sided numbness and headache on the left side.  He reports that the left-sided numbness, left-sided headache, dizziness, and nausea started after taking 1 dose of muscle relaxer at approximately 5 AM on day of presentation.  He reports that approximately 2 weeks ago he presented to the chiropractor for regular adjustments.  He denies having any symptoms at that time.  However, he reports that he did have neck pain about 3 weeks ago.  He reports that approximately, 1 week ago he developed left-sided neck pain and headache.    He presented to [facility] and he was prescribed muscle relaxer.  His symptoms minimally improved with the muscle relaxer prescribed.  He further endorses occasional double vision, blurry vision that is beyond his baseline vision changes that requires corrective glasses.  At bedside he states that his symptoms are minimally improved since presentation to the emergency department.  He reports that he has never felt the symptoms before.  He endorses imbalance and dizziness.  Social history: He lives at home with his wife and 32-monthold child.  He denies tobacco use.  In he infrequently drinks EtOH.  He denies recreational drug use.  She currently works as a cCamera operator  Vaccination history: Patient is vaccinated for COVID-19  ROS: Constitutional: no weight change, no fever ENT/Mouth: no sore throat, no rhinorrhea Eyes: no eye pain, + vision changes Cardiovascular: no chest pain, no dyspnea,  no edema, no palpitations Respiratory: no cough,  no sputum, no wheezing Gastrointestinal: no nausea, no vomiting, no diarrhea, no constipation Genitourinary: no urinary incontinence, no dysuria, no hematuria Musculoskeletal: no arthralgias, no myalgias Skin: no skin lesions, no pruritus, Neuro: + weakness, no loss of consciousness, no syncope Psych: no anxiety, no depression, + decrease appetite Heme/Lymph: no bruising, no bleeding  ED Course: Discussed with emergency medicine provider, patient requiring hospitalization for chief concerns of left vertebral artery dissection.  Vitals in the emergency department was remarkable for temperature of 97.8, respiration rate of 18, heart rate of 50, blood pressure 147/85, SPO2 of 96% on room air and appeared  Labs in the emergency department was remarkable for sodium 140, potassium 3.8, chloride 106, bicarb 26, BUN 20, serum creatinine of 0.85, EGFR greater than 60, nonfasting blood glucose 144, WBC 18.4, hemoglobin 17, platelets 227, troponin was 3.  UA is negative for leukocytes and nitrates.  CTA of the head and neck with and without contrast material was ordered and was read as irregularity of the V2 segment of the left vertebral artery with spikes up to mild to moderate stenosis.  Irregularity of the V4 left vertebral artery with sites of up to moderate and severe stenosis.  These findings are highly suspicious for left vertebral artery dissection given the provided history.    EDP consulted neurology who recommends aspirin at this time an MRI of the brain and cervical spine.  In the emergency department patient was given Benadryl 25 mg IV, Phenergan 12.5 mg IV, Zofran 4 mg IV, Decadron 10 mg, Ativan 0.5 mg IV.  Neurologist ordered aspirin 325 mg.  In the emergency department patient and family was  advised to not get out of bed.  However when nursing staff stepped out of the room, patient asked his wife to attempt to help him stand up in order to urinate and he sustained a ground-level fall.  ED  provider assessed the patient and determined that no further imaging is indicated at that time.  Assessment/Plan  Active Problems:   Vertebral artery dissection (HCC)   Obesity (BMI 35.0-39.9 without comorbidity)   Essential hypertension   # Stroke like symptoms presumed secondary to left-sided vertebral dissection and now stenosis - CT a of the head and neck with and without contrast material read as above - Neurology has been consulted and recommends MRI of the brain and MRI of the cervical spine without contrast - Neurology, Dr. Quinn Axe recommends aspirin at this time and will add Plavix if there is a stroke on MRI - No further interventions recommended at this time per neurology - Symptomatic management: Zofran for nausea and vomiting, meclizine for dizziness, and gabapentin for neuropathy and numbness and pain - A.m. team to consult PT and OT - Fall precautions - Goal MAP is 85  # Hypertension-given the setting of vertebral artery stenosis, we will maintain for goal MAP of greater than 85 at this time - Hydralazine 2 mg IV every 4 hours as needed for SBP greater than 170, 24 hours ordered  # Blurry vision with nystagmus - Per neurology suspect secondary to the involvement of the brain in the medulla  # Obesity-outpatient follow-up  Chart reviewed.   DVT prophylaxis: SCDs Code Status: Full code Diet: Heart healthy Family Communication: Updated father, Kelsie Kramp, Sr. at bedside with patient's permission Disposition Plan: Pending clinical course Consults called: Neurology Admission status: MedSurg, observation, telemetry ordered for 24 hours  History reviewed. No pertinent past medical history.  History reviewed. No pertinent surgical history.  Social History:  reports that he has been smoking cigars. He has never used smokeless tobacco. He reports current alcohol use of about 2.0 standard drinks per week. He reports that he does not use drugs.  No Known  Allergies History reviewed. No pertinent family history. Family history: Family history reviewed and not pertinent  Prior to Admission medications   Medication Sig Start Date End Date Taking? Authorizing Provider  cyclobenzaprine (FLEXERIL) 5 MG tablet Take 5 mg by mouth 3 (three) times daily as needed. 09/05/20  Yes [provider]  methylPREDNISolone (MEDROL DOSEPAK) 4 MG TBPK tablet Take 1-6 tablets by mouth daily. Take 6 tablets on day 1 as directed on package and decrease by 1 tab each day for a total of 6 days 09/05/20 09/10/20 Yes [provider]   Physical Exam: Vitals:   09/09/20 0900 09/09/20 1015 09/09/20 1047 09/09/20 1136  BP: (!) 143/92 (!) 126/115 (!) 154/99 (!) 145/91  Pulse: (!) 50 (!) 51 61 (!) 59  Resp: _0 Temp:      TempSrc:      SpO2: 98% 99% 100% 94%  Weight:      Height:       Constitutional: appears older than chronological age, NAD, calm, comfortable Eyes: PERRL, lids and conjunctivae normal, minimal left lateral nystagmus with lateral gaze. ENMT: Mucous membranes are moist. Posterior pharynx clear of any exudate or lesions. Age-appropriate dentition. Hearing appropriate Neck: normal, supple, no masses, no thyromegaly Respiratory: clear to auscultation bilaterally, no wheezing, no crackles. Normal respiratory effort. No accessory muscle use.  Cardiovascular: Regular rate and rhythm, no murmurs / rubs / gallops.  No extremity edema. 2+ pedal pulses. No carotid bruits.  Abdomen: Obese abdomen, no tenderness, no masses palpated, no hepatosplenomegaly. Bowel sounds positive.  Musculoskeletal: no clubbing / cyanosis. No joint deformity upper and lower extremities. Good ROM, no contractures, no atrophy. Normal muscle tone.  Skin: no rashes, lesions, ulcers. No induration Neurologic: Sensation intact. Strength 5/5 in all 4.  Psychiatric: Normal judgment and insight. Alert and oriented x 3. Normal mood.   EKG: independently reviewed, showing  sinus bradycardia with rate of 57, QTc 439  Imaging on Admission: I personally reviewed and I agree with radiologist reading as below.  CT ANGIO HEAD NECK W WO CM  Result Date: 09/09/2020 CLINICAL DATA:  Provided history: Dizziness, persistent/recurrent, cardiac or vascular cause suspected; left sided neck pain s/p chiropractic adjustment with HA. EXAM: CT ANGIOGRAPHY HEAD AND NECK TECHNIQUE: Multidetector CT imaging of the head and neck was performed using the standard protocol during bolus administration of intravenous contrast. Multiplanar CT image reconstructions and MIPs were obtained to evaluate the vascular anatomy. Carotid stenosis measurements (when applicable) are obtained utilizing NASCET criteria, using the distal internal carotid diameter as the denominator. CONTRAST:  87m OMNIPAQUE IOHEXOL 350 MG/ML SOLN COMPARISON:  None FINDINGS: CT HEAD FINDINGS Brain: Cerebral volume is normal. There is no acute intracranial hemorrhage. No demarcated cortical infarct. No extra-axial fluid collection. No evidence of an intracranial mass. No midline shift. Vascular: No hyperdense vessel. Skull: Normal. Negative for fracture or focal lesion. Sinuses: Moderate partial opacification of the right frontal sinus. Mild-to-moderate mucosal thickening within the left frontal sinus. Moderate partial opacification of the bilateral ethmoid air cells, likely due to the presence of mucosal thickening and fluid. Mild left maxillary sinus mucosal thickening. Orbits: No mass or acute finding. Review of the MIP images confirms the above findings CTA NECK FINDINGS Aortic arch: Aberrant right subclavian artery. The origin of the right subclavian artery is incompletely included in the field of view. The visualized aortic arch is normal in caliber. No hemodynamically significant stenosis within the proximal subclavian arteries. Right carotid system: CCA and ICA patent within the neck without stenosis or significant atherosclerotic  disease. No evidence of dissection. Left carotid system: CCA and ICA patent within the neck without stenosis or significant atherosclerotic disease. No evidence of dissection Vertebral arteries: Venous contrast reflux limits evaluation of the V1 and proximal V2 segments of the right vertebral artery. Within this limitation, the right vertebral artery is patent within the neck without stenosis or evidence of dissection. The left vertebral artery is patent within the neck. However, there are sites of irregularity and mild to moderate narrowing within the V2 left vertebral artery highly suspicious for sequela of dissection given the provided history (for instance as seen on series 7, image 137). Skeleton: No acute bony abnormality or aggressive osseous lesion. Other neck: No neck mass or cervical lymphadenopathy. Upper chest: No consolidation within the imaged lung apices. Review of the MIP images confirms the above findings CTA HEAD FINDINGS Anterior circulation: The intracranial internal carotid arteries are patent. The M1 middle cerebral arteries are patent. No M2 proximal branch occlusion or high-grade proximal stenosis is identified. The anterior cerebral arteries are patent. No intracranial aneurysm is identified. Posterior circulation: The intracranial right vertebral artery is patent without stenosis. There is irregularity and sites of up to moderate/severe narrowing within the V4 left vertebral artery, highly suspicious for dissection given the provided history (for instance as seen on series 7, images 211 and 212) (series 8, images 131 and 132).  The basilar artery is patent. The posterior cerebral arteries are patent. The posterior communicating arteries are hypoplastic or absent bilaterally. Venous sinuses: Within the limitations of contrast timing, no convincing thrombus. Anatomic variants: As described. Review of the MIP images confirms the above findings These results were called by telephone at the time  of interpretation on 09/09/2020 at 10:35 am to provider Lavonia Drafts , who verbally acknowledged these results. IMPRESSION: CT head: 1. No evidence of acute intracranial abnormality. 2. Paranasal sinus disease, as described. Correlate for acute sinusitis. CTA head/neck: 1. Irregularity of the V2 segment of the left vertebral artery with sites of up to mild/moderate stenosis. Irregularity of the V4 left vertebral artery with sites of up to moderate/severe stenosis. These findings are highly suspicious for left vertebral artery dissection given the provided history. A brain MRI is recommended to assess for acute intracranial ischemia. 2. Elsewhere within the head and neck, there is no large vessel occlusion or proximal high-grade arterial stenosis. 3. Aberrant right subclavian artery. The right subclavian artery origin is incompletely included in the field of view. Electronically Signed   By: Kellie Simmering D.O.   On: 09/09/2020 10:36    Labs on Admission: I have personally reviewed following labs  CBC: Recent Labs  Lab 09/09/20 0758  WBC 18.4*  HGB 17.0  HCT 49.1  MCV 85.8  PLT 188   Basic Metabolic Panel: Recent Labs  Lab 09/09/20 0758  NA 140  K 3.8  CL 106  CO2 26  GLUCOSE 144*  BUN 20  CREATININE 0.85  CALCIUM 9.0   GFR: Estimated Creatinine Clearance: 167.1 mL/min (by C-G formula based on SCr of 0.85 mg/dL).  Liver Function Tests: Recent Labs  Lab 09/09/20 0758  AST 15  ALT 15  ALKPHOS 72  BILITOT 0.9  PROT 7.2  ALBUMIN 4.1   CBG: Recent Labs  Lab 09/09/20 0656  GLUCAP 150*   Urine analysis:    Component Value Date/Time   COLORURINE STRAW (A) 09/09/2020 1141   APPEARANCEUR CLEAR (A) 09/09/2020 1141   LABSPEC 1.032 (H) 09/09/2020 1141   PHURINE 7.0 09/09/2020 1141   GLUCOSEU NEGATIVE 09/09/2020 1141   HGBUR NEGATIVE 09/09/2020 1141   [place] 09/09/2020 1141   [place] 09/09/2020 1141   PROTEINUR NEGATIVE 09/09/2020 1141   NITRITE  NEGATIVE 09/09/2020 [place] 09/09/2020 1141   Dr. Tobie Poet [facility]  If 7PM-7AM, please contact overnight-coverage provider If 7AM-7PM, please contact day coverage provider [email or web address]  09/09/2020, 12:17 PM

## 2020-09-09 NOTE — Evaluation (Signed)
Speech Language Pathology Evaluation Patient Details Name: Curtis [name] MRN: [id number] DOB: November 08, 1985 Today's Date: 09/09/2020 Time: 1751-0258 SLP Time Calculation (min) (ACUTE ONLY): 45 min  Problem List:  Patient Active Problem List   Diagnosis Date Noted   Vertebral artery dissection (HCC) 09/09/2020   Obesity (BMI 35.0-39.9 without comorbidity) 09/09/2020   Essential hypertension 09/09/2020   Past Medical History: History reviewed. No pertinent past medical history. Past Surgical History: History reviewed. No pertinent surgical history. HPI:  Pt is a 35 y.o. male with medical history significant for obesity, presents emergency department for chief concerns of left-sided numbness and headache on the left side.  He reports that the left-sided numbness, left-sided headache, dizziness, and nausea started after taking 1 dose of muscle relaxer at approximately 5 AM on day of presentation.  He reports that approximately 2 weeks ago he presented to the chiropractor for regular adjustments.  He denies having any symptoms at that time.  However, he reports that he did have neck pain about 3 weeks ago.  He reports that approximately, 1 week ago he developed left-sided neck pain and headache.       He presented to [facility] and he was prescribed muscle relaxer.  His symptoms minimally improved with the muscle relaxer prescribed.  He further endorses occasional double vision, blurry vision that is beyond his baseline vision changes that requires corrective glasses.     At bedside he states that his symptoms are minimally improved since presentation to the emergency department.  He reports that he has never felt the symptoms before.  He endorses imbalance and dizziness which is severe per pt report.   MRI: Small acute stroke of the lateral left medulla.  MR of Cervical Spine: Possible  crescentic abnormal signal is present about the left V2 vertebral  artery at the C3-C4 to C5 levels; No abnormal cord  signal.  No canal or foraminal stenosis.   Assessment / Plan / Recommendation Clinical Impression  Pt appears to present w/ mild Dysarthria w/ min impact on his verbal communication at the conversation level. Pt stated his speech doesn't sound as "clear" and "it takes some effort". When pt utilized strategies of slowing rate of speech, emphasizing speech sounds, and supporting words w/ full breath support (getting Louder), pt's Dysarthria lessened and articulation of speech improved significantly. Education and practice of articulation strategies was completed w/ both pt and Wife, Father of pt who were present -- focused on over-articulation w/ Big, Loud speech; and need to Slow Down in order to improve articulatory precision of his speech. Pt tended to speak quickly (both Father and Wife stated pt talked "fast" normally), but noted when he emphasized words w/ increased volume and effort, intelligibility greatly improved. Pt presented w/ mild+ oral motor weakness c/b Left lingual decreased ROM and strength during tasks of lingual protrusion and lateral reach/strength. With increased effort, ROM could be achieved, strength exhibited.   No expressive or receptive aphasia noted, no overt cognitive deficits noted.  Discussed strategies w/ pt/Wife; encouraged practicing Big, Loud speech min more Slowly to address the mild Dysarthria together and gave Handouts.   Recommend pt f/u w/ Outpatient skilled ST servcies, or potentially CIR, to continue to address any Dysarthria through speech and OM exercises and strategies to improve verbal communication in ADLs and at his employment. CM/NSG/MD updated. Pt and Wife agreed.  Also discussed general aspiration precautions w/ pt and Family encouraging following such(small, single sips/bites; sitting fully upright w/ all oral intake; etc) to  increase oral control of boluses and decrease risk for aspiration, especially during pill swallowing (educated on use of applesauce w/  pill swallowing if needed). Pt and Wife agreed. Will continue to monitor.    SLP Assessment  SLP Recommendation/Assessment: All further Speech Lanaguage Pathology  needs can be addressed in the next venue of care SLP Visit Diagnosis: Dysarthria and anarthria (R47.1)    Follow Up Recommendations  Inpatient Rehab (if meets criteria)    Frequency and Duration  (n/a)   (n/a)      SLP Evaluation Cognition  Overall Cognitive Status: Within Functional Limits for tasks assessed Arousal/Alertness: Awake/alert Orientation Level: Oriented X4 Attention: Focused;Sustained Focused Attention: Appears intact Sustained Attention: Appears intact Memory: Appears intact Awareness: Appears intact Behaviors:  (n/a) Safety/Judgment: Appears intact       Comprehension  Auditory Comprehension Overall Auditory Comprehension: Appears within functional limits for tasks assessed Yes/No Questions: Within Functional Limits Commands: Within Functional Limits Conversation: Complex Interfering Components:  (n/a) Visual Recognition/Discrimination Discrimination: Not tested Reading Comprehension Reading Status: Not tested    Expression Expression Primary Mode of Expression: Verbal Verbal Expression Overall Verbal Expression: Appears within functional limits for tasks assessed Initiation: No impairment Automatic Speech: Name;Social Response;Day of week Level of Generative/Spontaneous Verbalization: Conversation Repetition: No impairment Pragmatics: No impairment Interfering Components: Speech intelligibility (mild dysarthria) Effective Techniques: Articulatory cues Non-Verbal Means of Communication: Not applicable Written Expression Written Expression: Not tested   Oral / Motor  Oral Motor/Sensory Function Overall Oral Motor/Sensory Function: Mild impairment Facial ROM: Within Functional Limits Facial Symmetry: Within Functional Limits Lingual ROM: Reduced left;Suspected CN XII (hypoglossal)  dysfunction Lingual Symmetry: Within Functional Limits (grossly) Lingual Strength: Reduced;Suspected CN XII (hypoglossal) dysfunction (on Left side) Velum: Within Functional Limits Mandible: Within Functional Limits Motor Speech Overall Motor Speech: Appears within functional limits for tasks assessed (mild limb ataxia noted on left side initially per notes) Respiration: Within functional limits Phonation: Normal Resonance: Within functional limits Articulation: Impaired Level of Impairment: Conversation Intelligibility: Intelligible Motor Planning: Witnin functional limits Motor Speech Errors: Not applicable Interfering Components:  (n/a) Effective Techniques: Slow rate;Increased vocal intensity;Over-articulate   GO                      Jerilynn Som, MS, CCC-SLP Speech Language Pathologist Rehab Services [phone] [facility] 09/09/2020, 6:42 PM

## 2020-09-09 NOTE — ED Notes (Signed)
Lab called at this time to collect blood samples. They stated they would send someone shortly.

## 2020-09-09 NOTE — ED Notes (Signed)
Pt on phone with MRI

## 2020-09-09 NOTE — ED Notes (Signed)
Dr. Don Perking in triage to see pt, pt reports he has been seen by chiropractor and had neck pain after x 2 weeks.  Pt also vomiting, Zofran ordered

## 2020-09-09 NOTE — ED Notes (Signed)
Instructed pt not to get out of bed. Gave pt urinal and instructed to call if repositioning needed to urinate.

## 2020-09-09 NOTE — ED Notes (Signed)
Messaged attending with pt request for increases meclizine d/t continued dizziness.

## 2020-09-09 NOTE — ED Notes (Signed)
This pt reported their lips are now numb. This RN messaged MD Kinner at this time to alert him.

## 2020-09-10 ENCOUNTER — Encounter: Payer: Self-pay | Admitting: Internal Medicine

## 2020-09-10 DIAGNOSIS — M542 Cervicalgia: Secondary | ICD-10-CM | POA: Diagnosis not present

## 2020-09-10 DIAGNOSIS — I119 Hypertensive heart disease without heart failure: Secondary | ICD-10-CM | POA: Diagnosis present

## 2020-09-10 DIAGNOSIS — I639 Cerebral infarction, unspecified: Secondary | ICD-10-CM | POA: Diagnosis present

## 2020-09-10 DIAGNOSIS — F17290 Nicotine dependence, other tobacco product, uncomplicated: Secondary | ICD-10-CM | POA: Diagnosis present

## 2020-09-10 DIAGNOSIS — D72829 Elevated white blood cell count, unspecified: Secondary | ICD-10-CM | POA: Diagnosis present

## 2020-09-10 DIAGNOSIS — Z20822 Contact with and (suspected) exposure to covid-19: Secondary | ICD-10-CM | POA: Diagnosis present

## 2020-09-10 DIAGNOSIS — K219 Gastro-esophageal reflux disease without esophagitis: Secondary | ICD-10-CM | POA: Diagnosis present

## 2020-09-10 DIAGNOSIS — G464 Cerebellar stroke syndrome: Secondary | ICD-10-CM | POA: Diagnosis present

## 2020-09-10 DIAGNOSIS — E6609 Other obesity due to excess calories: Secondary | ICD-10-CM | POA: Diagnosis not present

## 2020-09-10 DIAGNOSIS — I726 Aneurysm of vertebral artery: Secondary | ICD-10-CM | POA: Diagnosis present

## 2020-09-10 DIAGNOSIS — R270 Ataxia, unspecified: Secondary | ICD-10-CM | POA: Diagnosis present

## 2020-09-10 DIAGNOSIS — K5901 Slow transit constipation: Secondary | ICD-10-CM | POA: Diagnosis not present

## 2020-09-10 DIAGNOSIS — I7774 Dissection of vertebral artery: Secondary | ICD-10-CM | POA: Diagnosis present

## 2020-09-10 DIAGNOSIS — H5500 Unspecified nystagmus: Secondary | ICD-10-CM | POA: Diagnosis present

## 2020-09-10 DIAGNOSIS — I10 Essential (primary) hypertension: Secondary | ICD-10-CM | POA: Diagnosis not present

## 2020-09-10 DIAGNOSIS — I6389 Other cerebral infarction: Secondary | ICD-10-CM | POA: Diagnosis present

## 2020-09-10 DIAGNOSIS — G8324 Monoplegia of upper limb affecting left nondominant side: Secondary | ICD-10-CM | POA: Diagnosis present

## 2020-09-10 DIAGNOSIS — H532 Diplopia: Secondary | ICD-10-CM | POA: Diagnosis present

## 2020-09-10 DIAGNOSIS — I6502 Occlusion and stenosis of left vertebral artery: Secondary | ICD-10-CM | POA: Diagnosis present

## 2020-09-10 DIAGNOSIS — E669 Obesity, unspecified: Secondary | ICD-10-CM | POA: Diagnosis present

## 2020-09-10 DIAGNOSIS — Z6836 Body mass index (BMI) 36.0-36.9, adult: Secondary | ICD-10-CM | POA: Diagnosis not present

## 2020-09-10 DIAGNOSIS — R29702 NIHSS score 2: Secondary | ICD-10-CM | POA: Diagnosis present

## 2020-09-10 DIAGNOSIS — M7918 Myalgia, other site: Secondary | ICD-10-CM | POA: Diagnosis not present

## 2020-09-10 LAB — GLUCOSE, CAPILLARY
Glucose-Capillary: 101 mg/dL — ABNORMAL HIGH (ref 70–99)
Glucose-Capillary: 106 mg/dL — ABNORMAL HIGH (ref 70–99)
Glucose-Capillary: 75 mg/dL (ref 70–99)

## 2020-09-10 LAB — CBC
HCT: 47.1 % (ref 39.0–52.0)
Hemoglobin: 16.9 g/dL (ref 13.0–17.0)
MCH: 30.9 pg (ref 26.0–34.0)
MCHC: 35.9 g/dL (ref 30.0–36.0)
MCV: 86.1 fL (ref 80.0–100.0)
Platelets: 292 10*3/uL (ref 150–400)
RBC: 5.47 MIL/uL (ref 4.22–5.81)
RDW: 12 % (ref 11.5–15.5)
WBC: 26.6 10*3/uL — ABNORMAL HIGH (ref 4.0–10.5)
nRBC: 0 % (ref 0.0–0.2)

## 2020-09-10 LAB — LIPID PANEL
Cholesterol: 148 mg/dL (ref 0–200)
HDL: 63 mg/dL (ref >40)
LDL Cholesterol: 67 mg/dL (ref 0–99)
Total CHOL/HDL Ratio: 2.3 RATIO
Triglycerides: 90 mg/dL (ref <150)
VLDL: 18 mg/dL (ref 0–40)

## 2020-09-10 LAB — COMPREHENSIVE METABOLIC PANEL
ALT: 13 U/L (ref 0–44)
AST: 14 U/L — ABNORMAL LOW (ref 15–41)
Albumin: 4 g/dL (ref 3.5–5.0)
Alkaline Phosphatase: 75 U/L (ref 38–126)
Anion gap: 8 (ref 5–15)
BUN: 17 mg/dL (ref 6–20)
CO2: 27 mmol/L (ref 22–32)
Calcium: 9.1 mg/dL (ref 8.9–10.3)
Chloride: 101 mmol/L (ref 98–111)
Creatinine, Ser: 0.58 mg/dL — ABNORMAL LOW (ref 0.61–1.24)
GFR, Estimated: >60 mL/min (ref >60)
Glucose, Bld: 109 mg/dL — ABNORMAL HIGH (ref 70–99)
Potassium: 3.9 mmol/L (ref 3.5–5.1)
Sodium: 136 mmol/L (ref 135–145)
Total Bilirubin: 1.1 mg/dL (ref 0.3–1.2)
Total Protein: 7.2 g/dL (ref 6.5–8.1)

## 2020-09-10 LAB — SARS CORONAVIRUS 2 (TAT 6-24 HRS): SARS Coronavirus 2: NEGATIVE

## 2020-09-10 LAB — HEMOGLOBIN A1C
Hgb A1c MFr Bld: 5.4 % (ref 4.8–5.6)
Mean Plasma Glucose: 108.28 mg/dL

## 2020-09-10 MED ORDER — CHLORPROMAZINE HCL 25 MG PO TABS
25.0000 mg | ORAL_TABLET | Freq: Four times a day (QID) | ORAL | Status: DC | PRN
  Administered 2020-09-10 – 2020-09-12 (×8): 25 mg via ORAL
  Filled 2020-09-10 (×13): qty 1

## 2020-09-10 MED ORDER — CALCIUM CARBONATE ANTACID 500 MG PO CHEW
1.0000 | CHEWABLE_TABLET | Freq: Three times a day (TID) | ORAL | Status: DC | PRN
  Administered 2020-09-10 – 2020-09-12 (×3): 200 mg via ORAL
  Filled 2020-09-10 (×3): qty 1

## 2020-09-10 MED ORDER — LACTATED RINGERS IV SOLN: INTRAVENOUS | Status: AC

## 2020-09-10 MED ORDER — SODIUM CHLORIDE 0.9 % IV SOLN
25.0000 mg | Freq: Three times a day (TID) | INTRAVENOUS | Status: DC | PRN
  Administered 2020-09-10 – 2020-09-12 (×3): 25 mg via INTRAVENOUS
  Filled 2020-09-10: qty 1
  Filled 2020-09-10 (×2): qty 25

## 2020-09-10 NOTE — Progress Notes (Signed)
[facility] PROGRESS NOTE    Curtis [name]  ZOX:[id number] DOB: 1985/09/28 DOA: 09/09/2020 PCP: Pcp, No      Brief Narrative:  Curtis [name] is a 35 y.o. M with obesity no other significant PMHx presented with acute onset nausea, dizziness.  Patient had recently seen a chiropractor who is being treated with cervical manipulation.  On the morning of admission he developed sudden dizziness, nausea, neck pain, and headache and so he came to the ER.  CT angiogram was obtained that showed findings consistent with dissection of the left vertebral artery.  MRI brain showed small ischemic infarct in the left lateral medulla.         Assessment & Plan:  Acute ischemic stroke due to vertebral artery dissection -Non-invasive angiography showed vertebral artery aneurysm -Echo, carotid imaging and tele not indicated given mechanism of stroke clear -Lipids normal - Continue aspirin plus Plavix for 3 months followed by repeat vascular imaging with Neurology to determine ongoing DAPT     Vertigo Diplopia These are severe and debilitating at present.  Ativan and meclizine have not been helpful.  We will trial Phenergan today.  We have discussed with neurology and PT, of most benefit would be vestibular exercises and compensatory training to begin as soon as possible.  Also time. -Ondansetron as needed -Phenergan as needed -Meclizine if helpful -Monitor oral intake, if antiemetics do not reduce his nausea to the point that he will feel better, he may need some additional IV fluids  - Will give some fluids overnight  Obesity BMI 36  GERD - Continue PPI   Leukocytosis Likely reactive, no suspected infection     Disposition: Status is: Inpatient  Remains inpatient appropriate because: he has severe and debilitating vertigo and diplopia, not clearly taking fluids  Dispo: The patient is from: Home              Anticipated d/c is to: CIR              Patient  currently is not medically stable to d/c.   Difficult to place patient No       Level of care: Med-Surg       MDM: The below labs and imaging reports were reviewed and summarized above.  Medication management as above.     DVT prophylaxis: SCD's Start: 09/09/20 1213  Code Status: FULL Family Communication: Wife, father    Consultants:  Neurology  Procedures:  8/15 CT angiogram of the head and neck --left vertebral artery dissection 8/15 MRI brain and cervical spine --left medullary stroke  Antimicrobials:     Culture data:             Subjective: Still feeling severe diplopia, double vision, nausea, vertigo, and generalized fatigue.  left arm weakness and ataxia.  No fever, confusion.  Objective: Vitals:   09/10/20 0434 09/10/20 0603 09/10/20 0740 09/10/20 1236  BP: (!) 140/92 134/90 120/84 130/84  Pulse: 64 77 74 (!) 54  Resp: (!) Temp:  98 F (36.7 C) 97.7 F (36.5 C) 97.8 F (36.6 C)  TempSrc:      SpO2: 96% 98% 99% 96%  Weight:      Height:        Intake/Output Summary (Last 24 hours) at 09/10/2020 1516 Last data filed at 09/10/2020 1404 Gross per 24 hour  Intake 50 ml  Output --  Net 50 ml   Filed Weights   09/09/20 0631 09/09/20 0800  Weight: 122.5 kg 122.5 kg    Examination: General appearance:  adult male, awake but subdued and in moderate distress due to pain and vertigo.   HEENT: Anicteric, conjunctiva pink, lids and lashes normal. No nasal deformity, discharge, epistaxis.  Lips moist, dentition in normal repair, oropharynx moist, no oral lesions.   Skin: Warm and dry.  no jaundice.  No suspicious rashes or lesions. Cardiac: RRR, nl S1-S2, no murmurs appreciated.  No LE edema.  Radial  pulses 2+ and symmetric. Respiratory: Normal respiratory rate and rhythm.  CTAB without rales or wheezes. Abdomen:   MSK: No deformities or effusions. Neuro: Awake but sleepy, subdued due to nausea/vertigo.  Rotatory nystagmus,  left arm weakness, pronator drift, left leg strength seems better, speech fluent, vision with diplopia   Psych: Sensorium intact and responding to questions, attention diminished, affect blunted, judgment appears normal      Data Reviewed: I have personally reviewed following labs and imaging studies:  CBC: Recent Labs  Lab 09/09/20 0758 09/10/20 0551  WBC 18.4* 26.6*  HGB 17.0 16.9  HCT 49.1 47.1  MCV 85.8 86.1  PLT 327 292   Basic Metabolic Panel: Recent Labs  Lab 09/09/20 0758 09/10/20 0551  NA 140 136  K 3.8 3.9  CL 106 101  CO2 26 27  GLUCOSE 144* 109*  BUN 20 17  CREATININE 0.85 0.58*  CALCIUM 9.0 9.1   GFR: Estimated Creatinine Clearance: 177.6 mL/min (A) (by C-G formula based on SCr of 0.58 mg/dL (L)). Liver Function Tests: Recent Labs  Lab 09/09/20 0758 09/10/20 0551  AST 15 14*  ALT 15 13  ALKPHOS 72 75  BILITOT 0.9 1.1  PROT 7.2 7.2  ALBUMIN 4.1 4.0   No results for input(s): LIPASE, AMYLASE in the last 168 hours. No results for input(s): AMMONIA in the last 168 hours. Coagulation Profile: No results for input(s): INR, PROTIME in the last 168 hours. Cardiac Enzymes: No results for input(s): CKTOTAL, CKMB, CKMBINDEX, TROPONINI in the last 168 hours. BNP (last 3 results) No results for input(s): PROBNP in the last 8760 hours. HbA1C: Recent Labs    09/09/20 1458 09/10/20 0551  HGBA1C 5.5 5.4   CBG: Recent Labs  Lab 09/09/20 0656 09/09/20 1745 09/10/20 0744 09/10/20 1235  GLUCAP 150* 129* 101* 106*   Lipid Profile: Recent Labs    09/10/20 0551  CHOL 148  HDL 63  LDLCALC 67  TRIG 90  CHOLHDL 2.3   Thyroid Function Tests: No results for input(s): TSH, T4TOTAL, FREET4, T3FREE, THYROIDAB in the last 72 hours. Anemia Panel: No results for input(s): VITAMINB12, FOLATE, FERRITIN, TIBC, IRON, RETICCTPCT in the last 72 hours. Urine analysis:    Component Value Date/Time   COLORURINE STRAW (A) 09/09/2020 1141   APPEARANCEUR CLEAR  (A) 09/09/2020 1141   LABSPEC 1.032 (H) 09/09/2020 1141   PHURINE 7.0 09/09/2020 1141   GLUCOSEU NEGATIVE 09/09/2020 1141   HGBUR NEGATIVE 09/09/2020 1141   BILIRUBINUR NEGATIVE 09/09/2020 1141   KETONESUR NEGATIVE 09/09/2020 1141   PROTEINUR NEGATIVE 09/09/2020 1141   NITRITE NEGATIVE 09/09/2020 1141   LEUKOCYTESUR NEGATIVE 09/09/2020 1141   Sepsis Labs: @LABRCNTIP (procalcitonin:4,lacticacidven:4)  ) Recent Results (from the past 240 hour(s))  SARS CORONAVIRUS 2 (TAT 6-24 HRS) Nasopharyngeal Nasopharyngeal Swab     Status: None   Collection Time: 09/09/20  2:33 PM   Specimen: Nasopharyngeal Swab  Result Value Ref Range Status   SARS Coronavirus 2 NEGATIVE NEGATIVE Final    Comment: (NOTE) SARS-CoV-2 target nucleic acids  are NOT DETECTED.  The SARS-CoV-2 RNA is generally detectable in upper and lower respiratory specimens during the acute phase of infection. Negative results do not preclude SARS-CoV-2 infection, do not rule out co-infections with other pathogens, and should not be used as the sole basis for treatment or other patient management decisions. Negative results must be combined with clinical observations, patient history, and epidemiological information. The expected result is Negative.  Fact Sheet for Patients: HairSlick.no  Fact Sheet for Healthcare Providers: quierodirigir.com  This test is not yet approved or cleared by the [place] FDA and  has been authorized for detection and/or diagnosis of SARS-CoV-2 by FDA under an Emergency Use Authorization (EUA). This EUA will remain  in effect (meaning this test can be used) for the duration of the COVID-19 declaration under Se ction 564(b)(1) of the Act, 21 U.S.C. section 360bbb-3(b)(1), unless the authorization is terminated or revoked sooner.  Performed at [facility], [address] [place], [place] [address]          Radiology  Studies: CT ANGIO HEAD NECK W WO CM  Result Date: 09/09/2020 CLINICAL DATA:  Provided history: Dizziness, persistent/recurrent, cardiac or vascular cause suspected; left sided neck pain s/p chiropractic adjustment with HA. EXAM: CT ANGIOGRAPHY HEAD AND NECK TECHNIQUE: Multidetector CT imaging of the head and neck was performed using the standard protocol during bolus administration of intravenous contrast. Multiplanar CT image reconstructions and MIPs were obtained to evaluate the vascular anatomy. Carotid stenosis measurements (when applicable) are obtained utilizing NASCET criteria, using the distal internal carotid diameter as the denominator. CONTRAST:  66mL OMNIPAQUE IOHEXOL 350 MG/ML SOLN COMPARISON:  None FINDINGS: CT HEAD FINDINGS Brain: Cerebral volume is normal. There is no acute intracranial hemorrhage. No demarcated cortical infarct. No extra-axial fluid collection. No evidence of an intracranial mass. No midline shift. Vascular: No hyperdense vessel. Skull: Normal. Negative for fracture or focal lesion. Sinuses: Moderate partial opacification of the right frontal sinus. Mild-to-moderate mucosal thickening within the left frontal sinus. Moderate partial opacification of the bilateral ethmoid air cells, likely due to the presence of mucosal thickening and fluid. Mild left maxillary sinus mucosal thickening. Orbits: No mass or acute finding. Review of the MIP images confirms the above findings CTA NECK FINDINGS Aortic arch: Aberrant right subclavian artery. The origin of the right subclavian artery is incompletely included in the field of view. The visualized aortic arch is normal in caliber. No hemodynamically significant stenosis within the proximal subclavian arteries. Right carotid system: CCA and ICA patent within the neck without stenosis or significant atherosclerotic disease. No evidence of dissection. Left carotid system: CCA and ICA patent within the neck without stenosis or significant  atherosclerotic disease. No evidence of dissection Vertebral arteries: Venous contrast reflux limits evaluation of the V1 and proximal V2 segments of the right vertebral artery. Within this limitation, the right vertebral artery is patent within the neck without stenosis or evidence of dissection. The left vertebral artery is patent within the neck. However, there are sites of irregularity and mild to moderate narrowing within the V2 left vertebral artery highly suspicious for sequela of dissection given the provided history (for instance as seen on series 7, image 137). Skeleton: No acute bony abnormality or aggressive osseous lesion. Other neck: No neck mass or cervical lymphadenopathy. Upper chest: No consolidation within the imaged lung apices. Review of the MIP images confirms the above findings CTA HEAD FINDINGS Anterior circulation: The intracranial internal carotid arteries are patent. The M1 middle cerebral  arteries are patent. No M2 proximal branch occlusion or high-grade proximal stenosis is identified. The anterior cerebral arteries are patent. No intracranial aneurysm is identified. Posterior circulation: The intracranial right vertebral artery is patent without stenosis. There is irregularity and sites of up to moderate/severe narrowing within the V4 left vertebral artery, highly suspicious for dissection given the provided history (for instance as seen on series 7, images 211 and 212) (series 8, images 131 and 132). The basilar artery is patent. The posterior cerebral arteries are patent. The posterior communicating arteries are hypoplastic or absent bilaterally. Venous sinuses: Within the limitations of contrast timing, no convincing thrombus. Anatomic variants: As described. Review of the MIP images confirms the above findings These results were called by telephone at the time of interpretation on 09/09/2020 at 10:35 am to provider Jene Every , who verbally acknowledged these results. IMPRESSION:  CT head: 1. No evidence of acute intracranial abnormality. 2. Paranasal sinus disease, as described. Correlate for acute sinusitis. CTA head/neck: 1. Irregularity of the V2 segment of the left vertebral artery with sites of up to mild/moderate stenosis. Irregularity of the V4 left vertebral artery with sites of up to moderate/severe stenosis. These findings are highly suspicious for left vertebral artery dissection given the provided history. A brain MRI is recommended to assess for acute intracranial ischemia. 2. Elsewhere within the head and neck, there is no large vessel occlusion or proximal high-grade arterial stenosis. 3. Aberrant right subclavian artery. The right subclavian artery origin is incompletely included in the field of view. Electronically Signed   By: Jackey Loge D.O.   On: 09/09/2020 10:36   MR BRAIN WO CONTRAST  Result Date: 09/09/2020 CLINICAL DATA:  Neuro deficit, acute, stroke suspected; dizziness and weakness after chiropractor visit; abnormal CTA EXAM: MRI HEAD WITHOUT CONTRAST TECHNIQUE: Multiplanar, multiecho pulse sequences of the brain and surrounding structures were obtained without intravenous contrast. COMPARISON:  None. FINDINGS: Brain: Focal reduced diffusion at the left lateral aspect of the medulla. Ventricles and sulci are normal in size and configuration. A few small foci of T2 hyperintensity in the cerebral white matter likely reflect nonspecific gliosis/demyelination of doubtful clinical significance. There is no evidence of intracranial hemorrhage. There is no intracranial mass, mass effect, or edema. There is no hydrocephalus or extra-axial fluid collection. Vascular: Major vessel flow voids at the skull base are preserved. Skull and upper cervical spine: Normal marrow signal is preserved. Sinuses/Orbits: Paranasal sinuses are aerated. Orbits are unremarkable. Other: Sella is unremarkable.  Mastoid air cells are clear. IMPRESSION: Small acute stroke of the lateral left  medulla. These results were called by telephone at the time of interpretation on 09/09/2020 at 1:11 pm to provider [facility] , who verbally acknowledged these results. Electronically Signed   By: Guadlupe Spanish M.D.   On: 09/09/2020 13:12   MR Cervical Spine Wo Contrast  Result Date: 09/09/2020 CLINICAL DATA:  Cervical radiculopathy caudal right flecks; numbness face and neck after chiropractor adjustment EXAM: MRI CERVICAL SPINE WITHOUT CONTRAST TECHNIQUE: Multiplanar, multisequence MR imaging of the cervical spine was performed. No intravenous contrast was administered. COMPARISON:  None. FINDINGS: Alignment: No significant listhesis. Vertebrae: Vertebral body heights are maintained. No marrow edema. No suspicious osseous lesion Cord: Normal caliber and signal. Posterior Fossa, vertebral arteries, paraspinal tissues: Possible crescentic abnormal signal is present about the left V2 vertebral artery at the C3-C4 to C5 levels. May reflect subintimal hematoma of dissection seen on CTA. Otherwise unremarkable. Disc levels: Minimal central protrusion at C6-C7. There is  no canal or foraminal stenosis at any level. IMPRESSION: No abnormal cord signal.  No canal or foraminal stenosis. Electronically Signed   By: Guadlupe Spanish M.D.   On: 09/09/2020 13:17        Scheduled Meds:  aspirin EC  81 mg Oral Daily   clopidogrel  75 mg Oral Daily   insulin aspart  0-15 Units Subcutaneous TID WC   insulin aspart  0-5 Units Subcutaneous QHS   pantoprazole  40 mg Oral BID   Continuous Infusions:  promethazine (PHENERGAN) injection (IM or IVPB)       LOS: 0 days    Time spent: 35 minutes    Alberteen Sam, MD [facility] 09/10/2020, 3:16 PM     Please page though AMION or Epic secure chat:  For [employer], Higher education careers adviser

## 2020-09-10 NOTE — ED Notes (Signed)
Report to RN Antonieta Iba

## 2020-09-10 NOTE — Progress Notes (Signed)
Rehab Admissions Coordinator Note:  Patient was screened by Clois Dupes for appropriateness for an Inpatient Acute Rehab Consult per therapy recommendations.   At this time, we are recommending Inpatient Rehab consult. I will place order per protocol and an Admissions coordinator will follow up for full rehab assessment.  Clois Dupes RN MSN 09/10/2020, 1:17 PM  I can be reached at [phone].

## 2020-09-10 NOTE — Progress Notes (Addendum)
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   Subjective: - Nystagmus and diplopia persist - Dizziness improved on meclizine but not resolved. - Headache improved after starting gabapentin 300mg  tid - Nausea persisted on zofran, phenergan to be increased   Exam: Vitals:   09/10/20 1236 09/10/20 1642  BP: 130/84 136/84  Pulse: (!) 54 62  Resp: 18 18  Temp: 97.8 F (36.6 C) 98.1 F (36.7 C)  SpO2: 96% 100%   Physical Exam Gen: A&O x4, NAD HEENT: Atraumatic, normocephalic;mucous membranes moist; oropharynx clear, tongue without atrophy or fasciculations. Neck: Supple, trachea midline. Resp: CTAB, no w/r/r CV: RRR, no m/g/r; nml S1 and S2. 2+ symmetric peripheral pulses. Abd: soft/NT/ND; nabs x 4 quad Extrem: Nml bulk; no cyanosis, clubbing, or edema.   Neuro: *MS: A&O x4. Follows multi-step commands.  *Speech: fluid, nondysarthric, able to name and repeat *CN:    I: Deferred   II,III: PERRLA, VFF by confrontation, optic discs not visualized 2/2 pupillary constriction   III,IV,VI: EOMI w/ nystagmus on L lateral gaze, no ptosis   V: Sensation intact from V1 to V3 to LT   VII: Eyelid closure was full.  Smile symmetric.   VIII: Hearing intact to voice   IX,X: Voice normal, palate elevates symmetrically    XI: SCM/trap 5/5 bilat   XII: Tongue protrudes midline, no atrophy or fasciculations    *Motor:   Normal bulk.  No tremor, rigidity or bradykinesia. No pronator drift.     Strength: Dlt Bic Tri WrE WrF FgS Gr HF KnF KnE PlF DoF    Left 5 5 5 5 5 5 5 5 5 5 5 5     Right 5 5 5 5 5 5 5 5 5 5 5 5       *Sensory: Intact to light touch, pinprick, temperature vibration throughout. Symmetric. Propioception intact bilat.  No double-simultaneous extinction.  *Coordination:  ataxia on FNF and HTS on L side only *Reflexes:  2+ and symmetric throughout without clonus;  toes down-going bilat *Gait: deferred.   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 His vertigo, nystagmus (causing "bouncing vision" sensation, diplopia, and difficulty focusing), nausea, and intermittent hiccups are secondary to the stroke in this location.   Recommendations: - DAPT x3 mos. ASA 81mg  daily + plavix 75mg  daily - At 3 mos patient should have repeat vascular imaging to determine need to continue DAPT. - Avoid chiropractic manipulation, contact sports, any any activity that involves abrupt rotation and flexion-extension of neck - Permissive HTN up to SBP 180 x48 hrs; prn hydralazine or labetalol for BP outside of parameters. After 48 hrs goal is normotension. Avoid hypotension.  - Check LDL and A1c for overall stroke risk factor modification - Gapapentin 300mg  tid for headache - Prn zofran +/- phenergan and meclizine for nausea and vertigo, respectively - Eye patch for patient to alternate eyes to improve diplopia and nausea in the acute setting - Will continue to follow  , MD [facility] [phone]  If 7pm- 7am, please page neurology on call as listed in AMION.

## 2020-09-10 NOTE — Evaluation (Signed)
Occupational Therapy Evaluation Patient Details Name: Curtis [name] MRN: [id number] DOB: 1985-03-17 Today's Date: 09/10/2020    History of Present Illness Pt is a 35 y.o. male with medical history significant for obesity, presents emergency department for chief concerns of left-sided numbness and headache on the left side.  He reports that the left-sided numbness, left-sided headache, dizziness, and nausea started after taking 1 dose of muscle relaxer at approximately 5 AM on day of presentation.   He further endorses occasional double vision, blurry vision that is beyond his baseline vision changes that requires corrective glasses. He endorses imbalance and dizziness which is severe per pt report. 09/10/20 MRI: Small acute stroke of the lateral left medulla.  Curtis of Cervical Spine: Possible crescentic abnormal signal is present about the left V2 vertebral  artery at the C3-C4 to C5 levels   Clinical Impression   Curtis [name] was seen for OT evaluation this date. Prior to hospital admission, pt was Independent for mobility and I/ADLs including working full time at remote job. Pt lives with wife and 2 mo daughter in home c 3 STE and 1 rail. Pt presents to acute OT demonstrating impaired ADL performance and functional mobility 2/2 decreased activity tolerance, functional balance/coordination deficits, and visual deficits. Upon arrival pt supine in bed, eyes closed t/o majority of session 2/2 double vision/blurriness/nausea with head movement. Pt abruptly cracks neck while seated during session - instructed on MD recommendation (per chart) to avoid abrupt rotation, flexion, and extension of neck.   Pt currently requires SBA sup>sit, L lateral lean noted in sitting requiring UE support to correct. MAX A for bed>chair - pt requires no physcial assist to achieve initial standing however unable to maintain upright posture 2/2 L lean and dizziness causing major forward LOB requiring MAX A to correct and safely  assist to chair. Second trial standing using RW from chair - improved static standing balance c BUE support and eyes closed requiring CGA for ~30 seconds. Attempted marching in place resulted in L/anterior LOB requiring MAX A to correct and assist to sitting.   SETUP don/doff socks seated EOB - increased time to complete 2/2 L FMC deficits, unable to attempt LBD in standing 2/2 poor standing balance/tolerance. SETUP toothbrushing seated EOC - L lateral lean noted, MIN A to correct. Pt would benefit from skilled OT to address noted impairments and functional limitations (see below for any additional details) in order to maximize safety and independence while minimizing falls risk and caregiver burden. Upon hospital discharge, recommend CIR to maximize pt safety and return to PLOF.     Follow Up Recommendations  CIR    Equipment Recommendations  Other (comment) (TBD at next venue of care)    Recommendations for Other Services Rehab consult     Precautions / Restrictions Precautions Precautions: Fall Restrictions Weight Bearing Restrictions: No      Mobility Bed Mobility Overal bed mobility: Modified Independent             General bed mobility comments: requires eyes closed 2/2 dizziness    Transfers Overall transfer level: Needs assistance Equipment used: Rolling walker (2 wheeled) Transfers: Sit to/from [employer] Sit to Stand: Min guard;Max assist Stand pivot transfers: Max assist       General transfer comment: CGA sit>stand, MAX A stand>sit    Balance Overall balance assessment: Needs assistance Sitting-balance support: Single extremity supported;Feet supported Sitting balance-Leahy Scale: Fair   Postural control: Left lateral lean Standing balance support: Bilateral upper extremity supported  Standing balance-Leahy Scale: Poor Standing balance comment: zero for dynamic standing                           ADL either performed or  assessed with clinical judgement   ADL Overall ADL's : Needs assistance/impaired                                       General ADL Comments: SETUP don/doff socks seated EOB - increased time to complete 2/2 L FMC deficits, unable to attempt LBD in standing 2/2 poor standing balance/tolerance. SETUP toothbrushign seated EOC - L lateral lean noted, MIN A to correct. MAX A + RW for ADL t/f - pt requires no physcial assist to achieve standing however unable to maintain upright posture 2/2 L lean and dizziness causing major forward LOB requiring MAX A to correct and safely assist to sitting.      Pertinent Vitals/Pain Pain Assessment: No/denies pain     Hand Dominance Right   Extremity/Trunk Assessment Upper Extremity Assessment Upper Extremity Assessment: RUE deficits/detail;LUE deficits/detail RUE Deficits / Details: Pt endorses tingling to hands/forearm. LUE Deficits / Details: Pt endorses tingling to hands/forearm. Decreased FMC, increased time for RAM and unable to achieve full supination. + pronator drift   Lower Extremity Assessment Lower Extremity Assessment: Overall WFL for tasks assessed       Communication Communication Communication: No difficulties   Cognition Arousal/Alertness: Awake/alert Behavior During Therapy: WFL for tasks assessed/performed Overall Cognitive Status: Within Functional Limits for tasks assessed                                 General Comments: eyes closed t/o majority of session 2/2 pt reporting blurriness/diplopia, dizziness, and nausea   General Comments       Exercises Exercises: Other exercises Other Exercises Other Exercises: Pt and family edcuated re: OT role, DME recs, d/c recs, stroke education, falls prevention, HEP Other Exercises: LBD, grooming, sup>sit, sit<>stand, SPT, sitting/standing ballance/tolerance   Shoulder Instructions      Home Living Family/patient expects to be discharged to:: Private  residence Living Arrangements: Spouse/significant other Available Help at Discharge: Family (wife cares for 56mo daughter, father nearby) Type of Home: House Home Access: Stairs to enter Secretary/administrator of Steps: 3 Entrance Stairs-Rails:  (1 rail) Home Layout: One level     Bathroom Shower/Tub: Runner, broadcasting/film/video: Information systems manager - built in          Prior Functioning/Environment Level of Independence: Independent        Comments: Pt works from home full time, has 69mo daughter. Drives, plays golf        OT Problem List: Decreased activity tolerance;Impaired balance (sitting and/or standing);Decreased coordination;Decreased knowledge of use of DME or AE;Impaired sensation      OT Treatment/Interventions: Self-care/ADL training;Therapeutic exercise;Energy conservation;DME and/or AE instruction;Therapeutic activities;Balance training;Patient/family education    OT Goals(Current goals can be found in the care plan section) Acute Rehab OT Goals Patient Stated Goal: to return to PLOF OT Goal Formulation: With patient/family Time For Goal Achievement: 09/24/20 Potential to Achieve Goals: Good ADL Goals Pt Will Perform Grooming: with modified independence;standing (c LRAD PRN) Pt Will Perform Lower Body Dressing: sit to/from stand;Independently Pt Will Transfer to Toilet: with modified  independence;ambulating;bedside commode (c LRAD PRN) Additional ADL Goal #1: Pt will Independently verbalize plan to implement x3 falls prevnetion strategies  OT Frequency: Min 3X/week   Barriers to D/C: Inaccessible home environment             AM-PAC OT "6 Clicks" Daily Activity     Outcome Measure Help from another person eating meals?: None Help from another person taking care of personal grooming?: A Little Help from another person toileting, which includes using toliet, bedpan, or urinal?: A Lot Help from another person bathing (including washing, rinsing,  drying)?: A Lot Help from another person to put on and taking off regular upper body clothing?: A Little Help from another person to put on and taking off regular lower body clothing?: A Lot 6 Click Score: 16   End of Session Equipment Utilized During Treatment: Rolling walker Nurse Communication: Mobility status  Activity Tolerance: Patient tolerated treatment well Patient left: in chair;with call bell/phone within reach;with chair alarm set;with nursing/sitter in room;with family/visitor present  OT Visit Diagnosis: Other abnormalities of gait and mobility (R26.89)                Time: 6073-7106 OT Time Calculation (min): 43 min Charges:  OT General Charges $OT Visit: 1 Visit OT Evaluation $OT Eval Moderate Complexity: 1 Mod OT Treatments $Self Care/Home Management : 23-37 mins  Kathie Dike, M.S. OTR/L  09/10/20, 10:55 AM  ascom [phone]

## 2020-09-10 NOTE — Evaluation (Signed)
Physical Therapy Evaluation Patient Details Name: Curtis [name] MRN: [id number] DOB: [birth year] Today's Date: 09/10/2020   History of Present Illness  Curtis [name] is a 35 y.o. male with medical history significant for obesity, presents emergency department for chief concerns of left-sided numbness and headache on the left side.  He reports that the left-sided numbness, left-sided headache, dizziness, and nausea started after taking 1 dose of muscle relaxer at approximately 5 AM on day of presentation.   He further endorses occasional double vision, blurry vision that is beyond his baseline vision changes that requires corrective glasses. He endorses imbalance and dizziness which is severe per Curtis [name] report. 09/10/20 MRI: Small acute stroke of the lateral left medulla.  MR of Cervical Spine: Possible crescentic abnormal signal is present about the left V2 vertebral  artery at the C3-C4 to C5 levels  Clinical Impression  Curtis [name] is a pleasant 35 year old male who was admitted for vertebral artery dissection along with acute CVA. Curtis [name] performs bed mobility with mod I and transfers varying from mod->max assist. Unable to safely ambulate at this time due to impairments. Of note, Curtis [name] with +fall this admission. Curtis [name] demonstrates deficits with strength (L hemibody), balance, mobility. Would benefit from trial with AD to assist with mobility efforts. Throughout evaluation, keeps eyes closed due to dizziness, however is motivated and able to interact with therapist. Has great family support. Would benefit from skilled Curtis [name] to address above deficits and promote optimal return to PLOF. Currently recommend CIR due to intensity of therapy required to return back to PLOF. Curtis [name] very motivated and hopeful of complete recovery.    Follow Up Recommendations CIR    Equipment Recommendations  Rolling walker with 5" wheels    Recommendations for Other Services Rehab consult     Precautions / Restrictions Precautions Precautions:  Fall Restrictions Weight Bearing Restrictions: No      Mobility  Bed Mobility Overal bed mobility: Modified Independent             General bed mobility comments: able to perform sit->supine with safe technique and quick movement. Advised to slow down movement to reduce dizziness    Transfers Overall transfer level: Needs assistance Equipment used: 1 person hand held assist Transfers: Sit to/from [employer] Sit to Stand: Mod assist Stand pivot transfers: Max assist       General transfer comment: Improved ability to stand from chair with firm armrest with mod assist. On 2nd attempt from bed, requires max assist and unable to fully achieve upright posture with quick desent back to bed. During transfer to bed, needs max assist for transfer and unable to take steps at this time.  Ambulation/Gait             General Gait Details: unable to ambulate due to dizziness  Stairs            Wheelchair Mobility    Modified Rankin (Stroke Patients Only)       Balance Overall balance assessment: Needs assistance Sitting-balance support: Bilateral upper extremity supported;Feet supported Sitting balance-Leahy Scale: Fair Sitting balance - Comments: able to sit in recliner with back unsupported, however needs B hand support Postural control: Left lateral lean Standing balance support: Bilateral upper extremity supported Standing balance-Leahy Scale: Poor Standing balance comment: zero for dynamic standing                             Pertinent Vitals/Pain Pain Assessment: No/denies pain  Home Living Family/patient expects to be discharged to:: Private residence Living Arrangements: Spouse/significant other;Children (2 mo old daughter) Available Help at Discharge: Family (mom/dad live locally) Type of Home: House Home Access: Stairs to enter Entrance Stairs-Rails: Left Entrance Stairs-Number of Steps: 3 Home Layout: One  level Home Equipment: Shower seat - built in      Prior Function Level of Independence: Independent         Comments: Curtis [name] works from home full time, has 63mo daughter. Drives, plays golf     Hand Dominance   Dominant Hand: Right    Extremity/Trunk Assessment   Upper Extremity Assessment Upper Extremity Assessment: LUE deficits/detail (R UE grossly 5/5) RUE Deficits / Details: Curtis [name] endorses tingling to hands/forearm. LUE Deficits / Details: +pronator drift. Good RAMP. Sensation intact, however endorses tingling    Lower Extremity Assessment Lower Extremity Assessment: Overall WFL for tasks assessed (endorse tingling in L LE and weakness, although apparantly feels weakness with functional mobility)       Communication   Communication: No difficulties  Cognition Arousal/Alertness: Awake/alert Behavior During Therapy: WFL for tasks assessed/performed Overall Cognitive Status: Within Functional Limits for tasks assessed                                 General Comments: eyes closed during session due to dizziness/nausea. Rates dizziness at 8/10 increasing to 10/10 with any head movement      General Comments      Exercises Other Exercises Other Exercises: Curtis [name] and family edcuated re: OT role, DME recs, d/c recs, stroke education, falls prevention, HEP Other Exercises: LBD, grooming, sup>sit, sit<>stand, SPT, sitting/standing ballance/tolerance Other Exercises: patient/family education regarding HEP, compensation techniques for dizziness. Also discussed nutrition status as he is limited due to nausea. Other Exercises: practiced sit<>Stand from low surface-unable without max assist   Assessment/Plan    Curtis [name] Assessment Patient needs continued Curtis [name] services  Curtis [name] Problem List Decreased strength;Decreased activity tolerance;Decreased balance;Decreased mobility;Decreased knowledge of use of DME;Impaired sensation;Obesity       Curtis [name] Treatment Interventions DME  instruction;Gait training;Stair training;Therapeutic activities;Therapeutic exercise;Balance training    Curtis [name] Goals (Current goals can be found in the Care Plan section)  Acute Rehab Curtis [name] Goals Patient Stated Goal: to return to PLOF Curtis [name] Goal Formulation: With patient Time For Goal Achievement: 09/24/20 Potential to Achieve Goals: Good    Frequency 7X/week   Barriers to discharge        Co-evaluation               AM-PAC Curtis [name] "6 Clicks" Mobility  Outcome Measure Help needed turning from your back to your side while in a flat bed without using bedrails?: A Little Help needed moving from lying on your back to sitting on the side of a flat bed without using bedrails?: A Little Help needed moving to and from a bed to a chair (including a wheelchair)?: A Lot Help needed standing up from a chair using your arms (e.g., wheelchair or bedside chair)?: A Lot Help needed to walk in hospital room?: Total Help needed climbing 3-5 steps with a railing? : Total 6 Click Score: 12    End of Session Equipment Utilized During Treatment: Gait belt Activity Tolerance: Patient tolerated treatment well Patient left: in bed;with bed alarm set;with family/visitor present Nurse Communication: Mobility status Curtis [name] Visit Diagnosis: Unsteadiness on feet (R26.81);Muscle weakness (generalized) (M62.81);History of falling (Z91.81);Difficulty in walking, not elsewhere classified (R26.2);Dizziness and giddiness (  R42)    Time: 5993-5701 Curtis [name] Time Calculation (min) (ACUTE ONLY): 19 min   Charges:   Curtis [name] Evaluation $Curtis [name] Eval Moderate Complexity: 1 Mod Curtis [name] Treatments $Therapeutic Activity: 8-22 mins        Elizabeth Palau, Curtis [name], DPT [phone]   Yichen Gilardi 09/10/2020, 1:11 PM

## 2020-09-11 ENCOUNTER — Inpatient Hospital Stay: Payer: BC Managed Care – PPO

## 2020-09-11 LAB — BASIC METABOLIC PANEL
Anion gap: 9 (ref 5–15)
BUN: 14 mg/dL (ref 6–20)
CO2: 25 mmol/L (ref 22–32)
Calcium: 9 mg/dL (ref 8.9–10.3)
Chloride: 105 mmol/L (ref 98–111)
Creatinine, Ser: 0.73 mg/dL (ref 0.61–1.24)
GFR, Estimated: >60 mL/min (ref >60)
Glucose, Bld: 98 mg/dL (ref 70–99)
Potassium: 4 mmol/L (ref 3.5–5.1)
Sodium: 139 mmol/L (ref 135–145)

## 2020-09-11 LAB — CBC
HCT: 48.3 % (ref 39.0–52.0)
Hemoglobin: 16.7 g/dL (ref 13.0–17.0)
MCH: 30.2 pg (ref 26.0–34.0)
MCHC: 34.6 g/dL (ref 30.0–36.0)
MCV: 87.3 fL (ref 80.0–100.0)
Platelets: 280 10*3/uL (ref 150–400)
RBC: 5.53 MIL/uL (ref 4.22–5.81)
RDW: 12.3 % (ref 11.5–15.5)
WBC: 15.1 10*3/uL — ABNORMAL HIGH (ref 4.0–10.5)
nRBC: 0 % (ref 0.0–0.2)

## 2020-09-11 LAB — MAGNESIUM: Magnesium: 2.3 mg/dL (ref 1.7–2.4)

## 2020-09-11 IMAGING — MR MR HEAD W/O CM
11 series · 43 of 48 positions shown · non-contrast
Comparison: CT angiography same day.  MRI [DATE].

CLINICAL DATA: Neuro deficit, acute, stroke suspected. Left
vertebral artery dissection with left lateral medullary stroke and
worsening headache.

EXAM:
MRI HEAD WITHOUT CONTRAST
TECHNIQUE: Multiplanar, multiecho pulse sequences of the brain and surrounding
structures were obtained without intravenous contrast.

[Series 5: ax dwi_tracew · axial · 3.0mm · 0.65mm/px · z∈[-120,+29]mm · 3 of 48 slices shown]
[im 1/48]
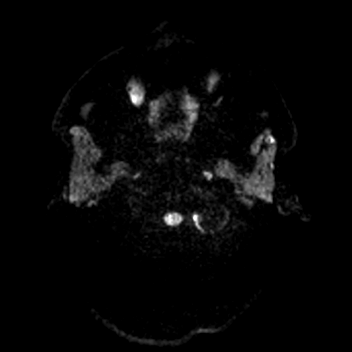
[im 24/48]
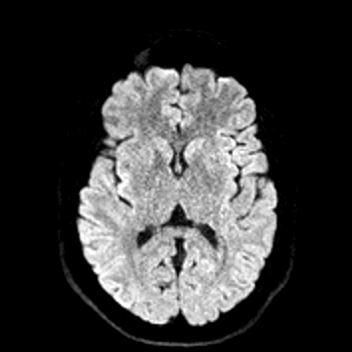
[im 48/48]
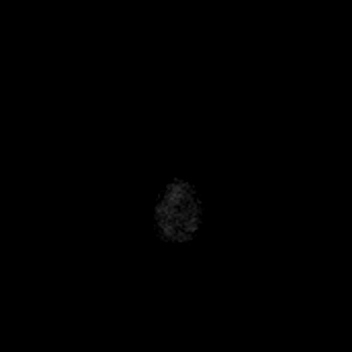

[Series 6: ax dwi_adc · axial · 3.0mm · 0.65mm/px · z∈[-120,+29]mm · 4 of 48 slices shown]
[im 1/48]
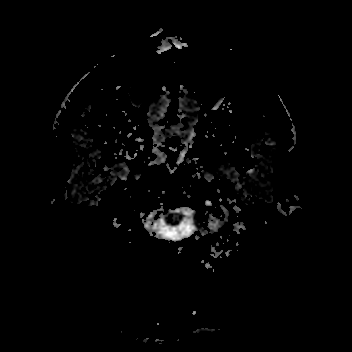
[im 16/48]
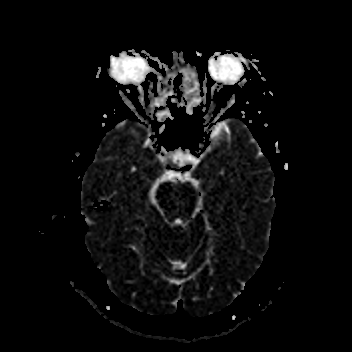
[im 32/48]
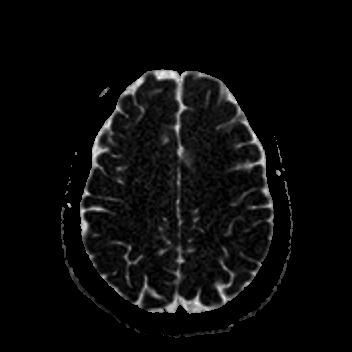
[im 48/48]
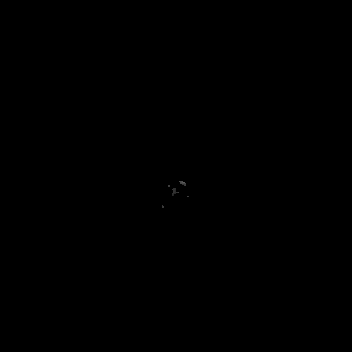

[Series 7: cor dwi_tracew · coronal · 5.0mm · 0.65mm/px · 3 of 40 slices shown]
[im 1/40]
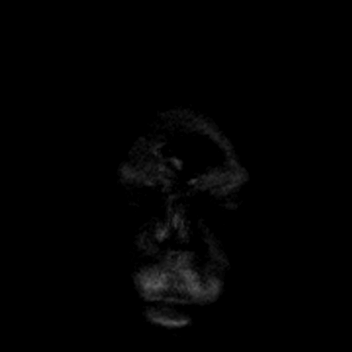
[im 20/40]
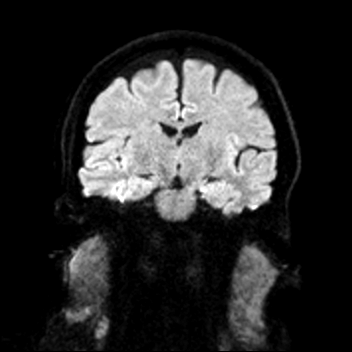
[im 40/40]
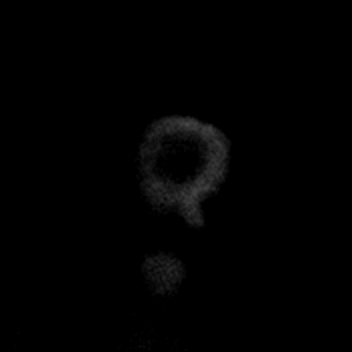

[Series 8: cor dwi_adc · coronal · 5.0mm · 0.65mm/px · 3 of 39 slices shown]
[im 1/39]
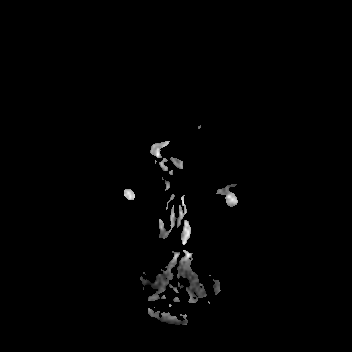
[im 20/39]
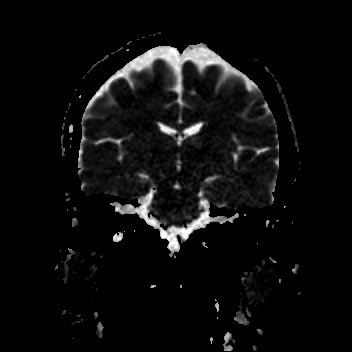
[im 39/39]
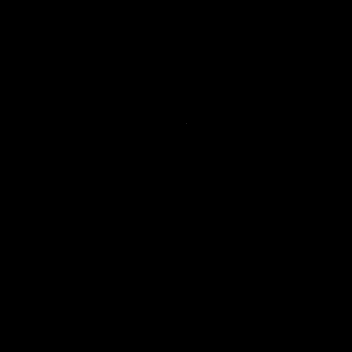

[Series 9: FLAIR · axial · 3.0mm · 0.53mm/px · z∈[-122,+34]mm · 4 of 55 slices shown]
[im 1/55]
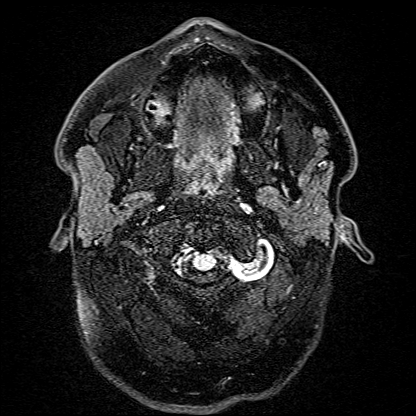
[im 19/55]
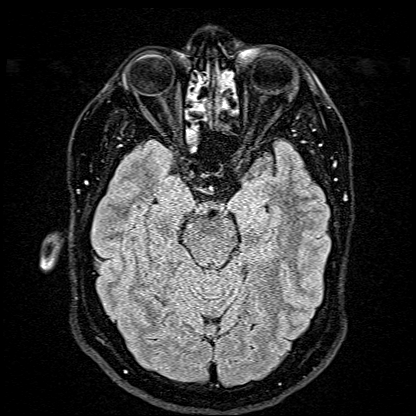
[im 37/55]
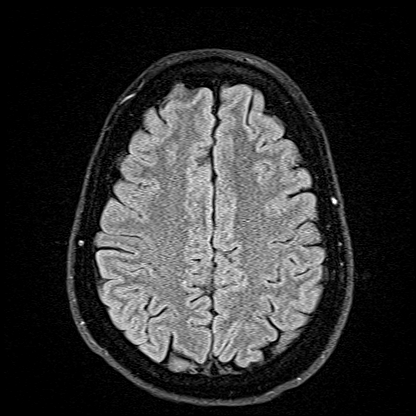
[im 55/55]
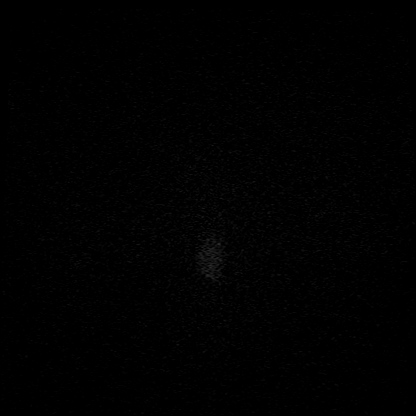

[Series 10: T1 · sagittal · 5.0mm · 0.62mm/px · 2 of 25 slices shown (1 of 2)]
[im 1/25]
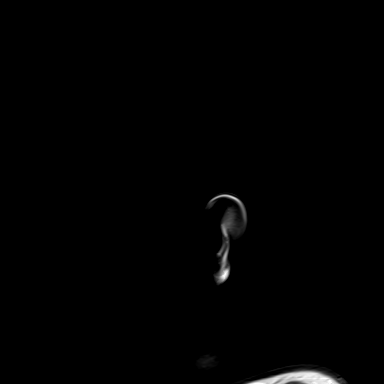
[im 25/25]
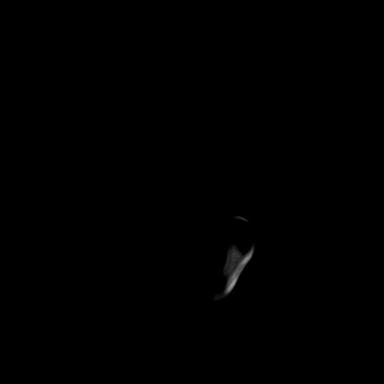

[Series 11: T2 · axial · 5.0mm · 0.53mm/px · z∈[-119,+31]mm · 2 of 27 slices shown (1 of 2)]
[im 1/27]
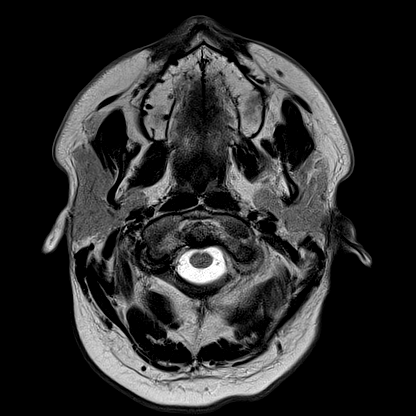
[im 27/27]
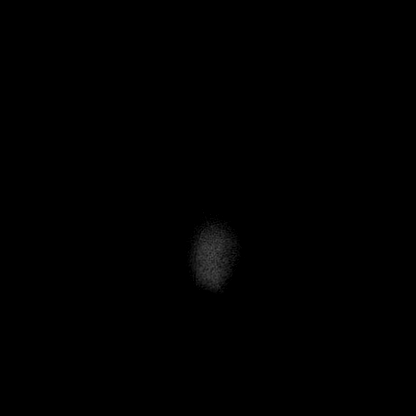

[Series 13: pha_images · axial · 3.0mm · 0.90mm/px · z∈[-129,+36]mm · 5 of 58 slices shown]
[im 1/58]
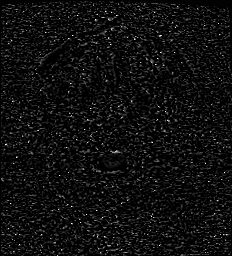
[im 15/58]
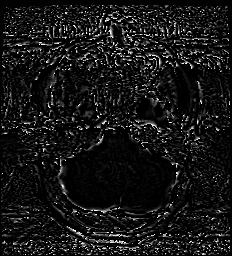
[im 29/58]
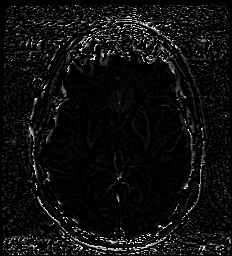
[im 43/58]
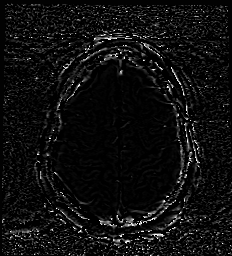
[im 58/58]
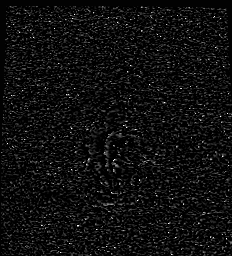

[Series 14: swi_images · axial · 3.0mm · 0.90mm/px · z∈[-129,+41]mm · 5 of 60 slices shown]
[im 1/60]
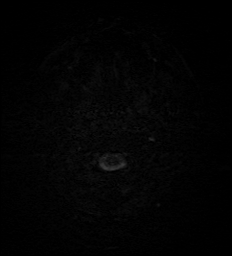
[im 15/60]
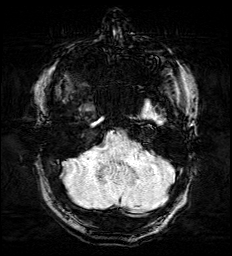
[im 30/60]
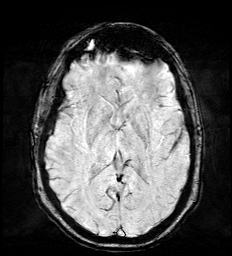
[im 45/60]
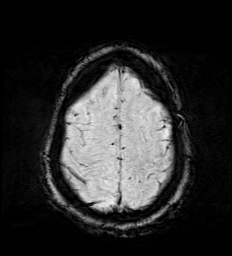
[im 60/60]
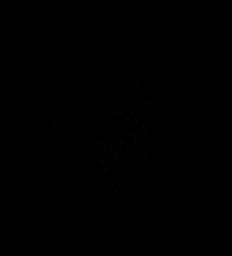

[Series 16: T1 · axial · 1.0mm · 0.98mm/px · z∈[-132,+36]mm · 9 of 174 slices shown (2 of 2)]
[im 1/174]
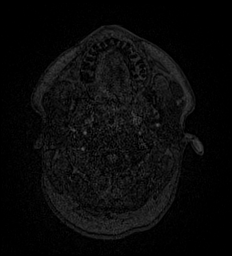
[im 14/174]
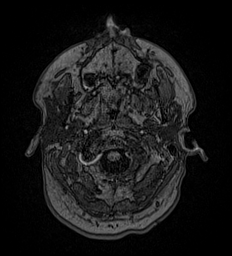
[im 27/174]
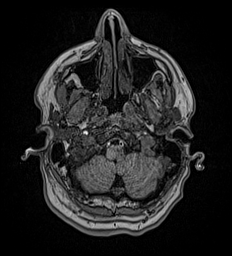
[im 54/174]
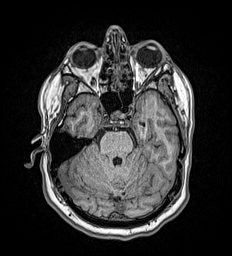
[im 80/174]
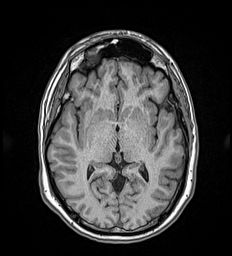
[im 94/174]
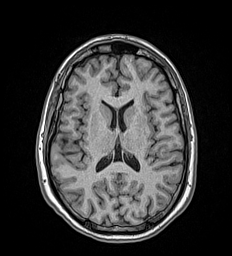
[im 120/174]
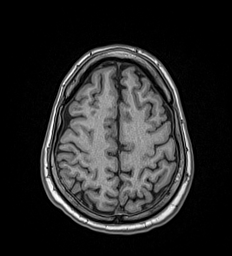
[im 147/174]
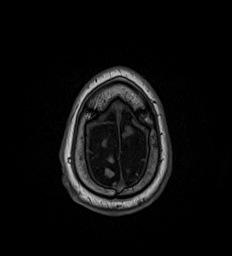
[im 174/174]
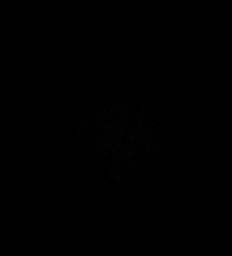

[Series 17: T2 · coronal · 5.0mm · 0.45mm/px · 3 of 34 slices shown (2 of 2)]
[im 1/34]
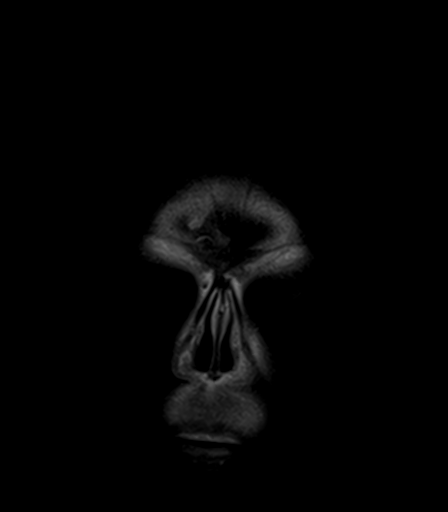
[im 17/34]
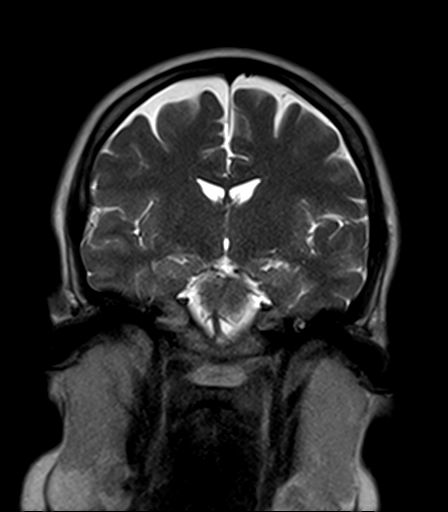
[im 34/34]
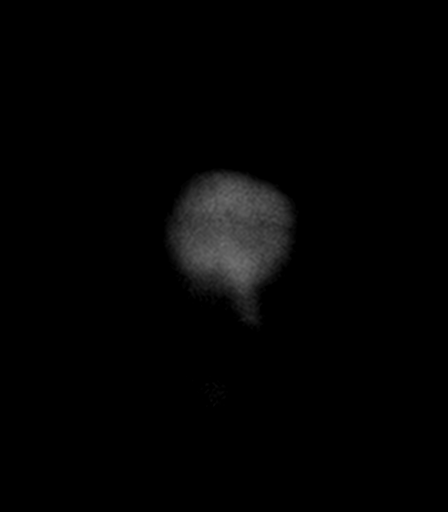

[43 of 48 positions shown; findings below may reference images not displayed]

FINDINGS: Brain: Acute infarction of the left medulla appears more extensive
compared to the study of 2 days ago. This may simply represent
evolutionary change of the infarction or could represent actual
extension. Patient now has clear T2 and FLAIR signal abnormality in
the region of the infarction. Mild petechial blood products are
present based on the [facility] imaging. No frank hematoma.

Otherwise, the brain appears normal. No cerebellar or occipital lobe
stroke. Cerebral hemispheres appear entirely normal. No
hydrocephalus or extra-axial collection.

Vascular: Major vessels at the base of the brain show flow.

Skull and upper cervical spine: Negative

Sinuses/Orbits: Continued sinus inflammatory changes, particularly
of the right frontal sinus.

Other: None
IMPRESSION: More conspicuous signal abnormality in the left medulla that could
possibly be evolutionary change of the previous stroke, though some
extension is not excluded. Petechial blood products in the region
without measurable hematoma.

## 2020-09-11 IMAGING — DX DG CHEST 1V PORT
1 series · 1 of 1 positions shown · non-contrast
Comparison: None.

CLINICAL DATA: Chest pain.

EXAM:
PORTABLE CHEST 1 VIEW

[chest ap]
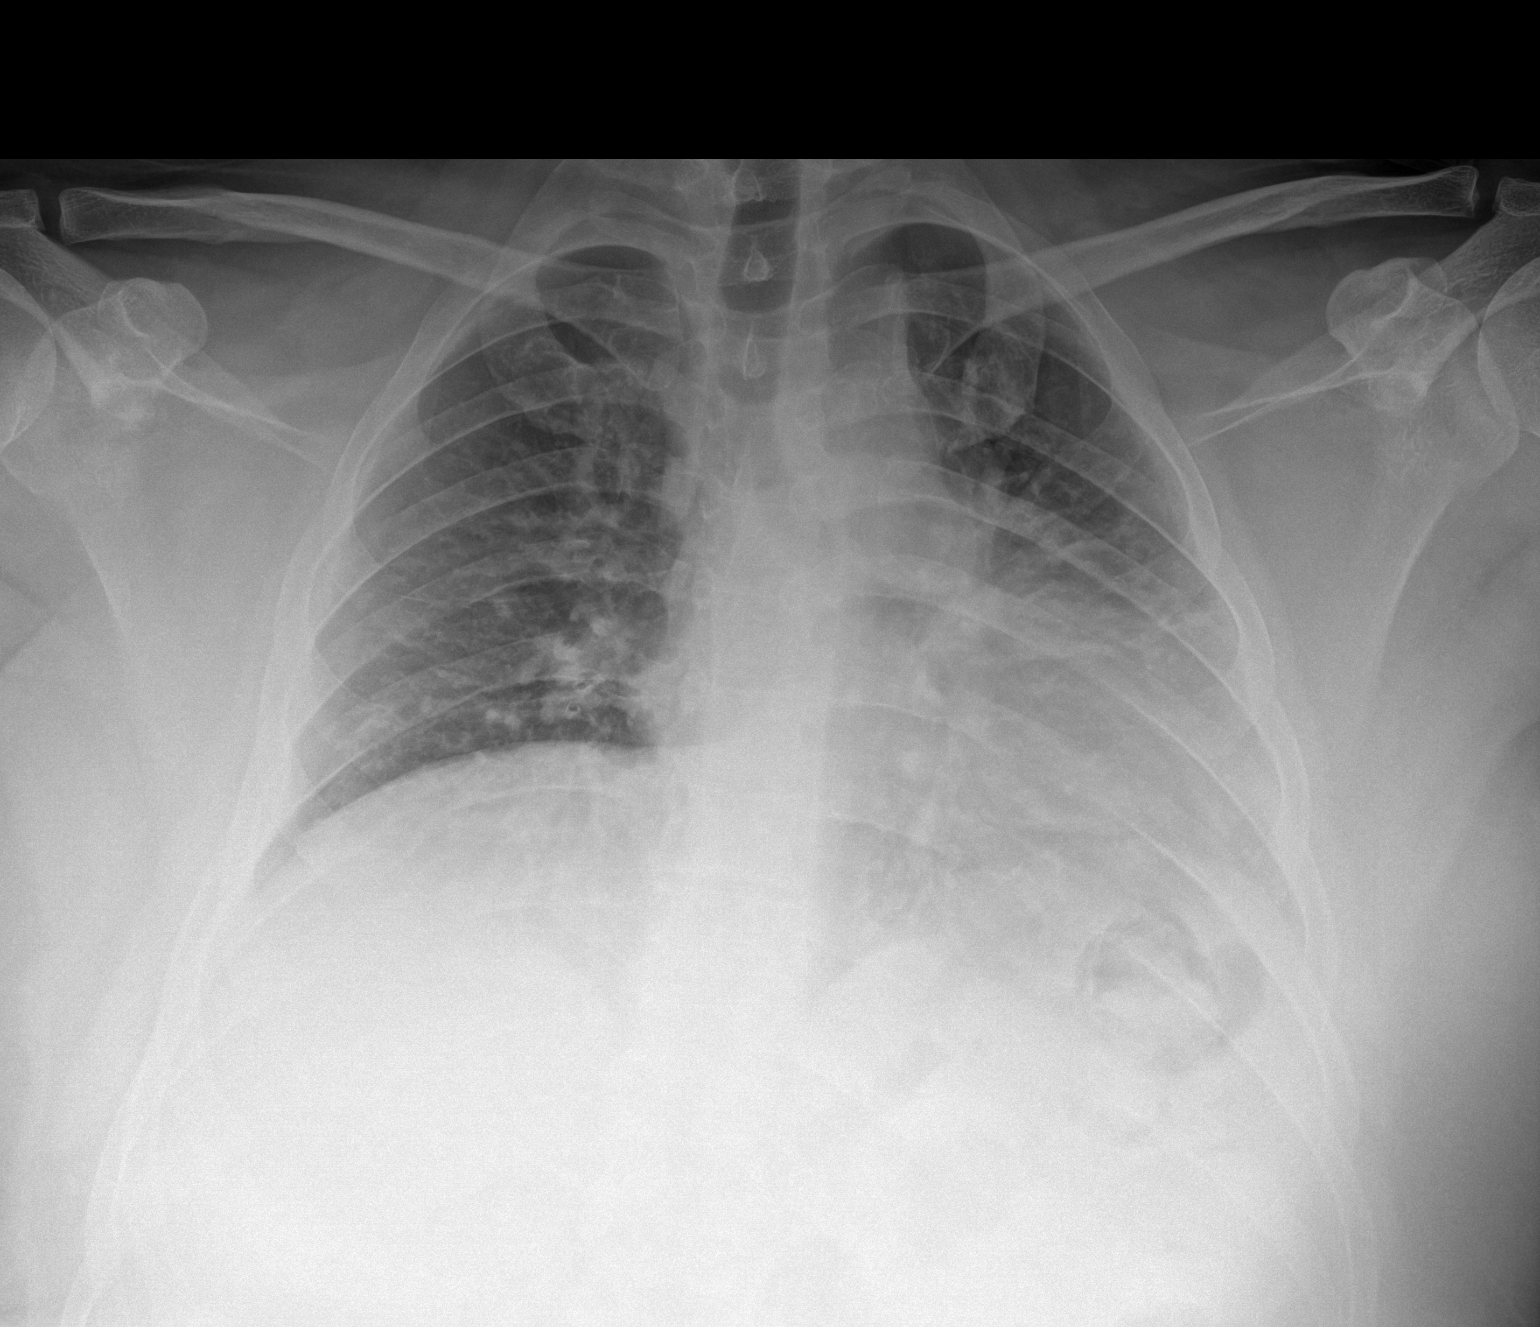

[1 of 1 positions shown; findings below may reference images not displayed]

FINDINGS: Cardiomegaly. No focal consolidation, pleural effusion, or
pneumothorax. No acute osseous abnormality.
IMPRESSION: 1. Cardiomegaly. No active disease.

## 2020-09-11 MED ORDER — GABAPENTIN 300 MG PO CAPS
300.0000 mg | ORAL_CAPSULE | Freq: Three times a day (TID) | ORAL | Status: DC
  Administered 2020-09-11 – 2020-09-13 (×6): 300 mg via ORAL
  Filled 2020-09-11 (×6): qty 1

## 2020-09-11 MED ORDER — ONDANSETRON HCL 4 MG/2ML IJ SOLN: 4.0000 mg | Freq: Four times a day (QID) | INTRAMUSCULAR | Status: DC | PRN

## 2020-09-11 MED ORDER — LACTATED RINGERS IV SOLN: INTRAVENOUS | Status: AC

## 2020-09-11 MED ORDER — SODIUM CHLORIDE 0.9 % IV SOLN
INTRAVENOUS | Status: DC | PRN
  Administered 2020-09-11 – 2020-09-12 (×2): 10 mL via INTRAVENOUS

## 2020-09-11 MED ORDER — KETOROLAC TROMETHAMINE 30 MG/ML IJ SOLN
30.0000 mg | Freq: Once | INTRAMUSCULAR | Status: AC
  Administered 2020-09-11: 30 mg via INTRAVENOUS
  Filled 2020-09-11: qty 1

## 2020-09-11 MED ORDER — HEPARIN SODIUM (PORCINE) 5000 UNIT/ML IJ SOLN
5000.0000 [IU] | Freq: Three times a day (TID) | INTRAMUSCULAR | Status: DC
  Administered 2020-09-11: 5000 [IU] via SUBCUTANEOUS
  Filled 2020-09-11: qty 1

## 2020-09-11 MED ORDER — CHLORPROMAZINE HCL 25 MG PO TABS
25.0000 mg | ORAL_TABLET | Freq: Once | ORAL | Status: AC
  Administered 2020-09-11: 04:00:00 25 mg via ORAL
  Filled 2020-09-11: qty 1

## 2020-09-11 MED ORDER — IOHEXOL 350 MG/ML SOLN
75.0000 mL | Freq: Once | INTRAVENOUS | Status: AC | PRN
  Administered 2020-09-11: 14:00:00 75 mL via INTRAVENOUS

## 2020-09-11 NOTE — Plan of Care (Signed)
  Problem: Education: Goal: Knowledge of disease or condition will improve Outcome: Progressing   Problem: Education: Goal: Knowledge of secondary prevention will improve Outcome: Progressing   Problem: Self-Care: Goal: Ability to participate in self-care as condition permits will improve Outcome: Progressing   Problem: Clinical Measurements: Goal: Respiratory complications will improve Outcome: Progressing   Problem: Nutrition: Goal: Adequate nutrition will be maintained Outcome: Progressing

## 2020-09-11 NOTE — PMR Pre-admission (Signed)
PMR Admission Coordinator Pre-Admission Assessment  Patient: Curtis [name] is an 35 y.o., male MRN: [id number] DOB: Nov 27, 1985 Height: 6' (182.9 cm) Weight: 122.5 kg Insurance Information HMO:     PPO: yes     PCP:      IPA:      80/20:      OTHER:  PRIMARY: [employer] [place]      Policy#: [id number]      Subscriber: pt CM Name: Morrie Sheldon      Phone#: [phone] option [phone] ext [phone]     Fax#: [phone] Pre-Cert#: [id number]  Employer: 8/17 Benefits:  Phone #: [phone]  Name: 8/17 Eff. Date: 01/27/2020     Deduct: $200      Out of Pocket Max: $2400      Life Max: none CIR: $500 co pay per admit      SNF: 100% 100 days Outpatient: $25 per visits     Co-Pay: per medical neccesity Home Health: 100%      Co-Pay: per medical neccesity DME: 100%     Co-Pay: none Providers: in network SECONDARY: none  Financial Counselor:       Phone#:   The Engineer, materials Information Summary" for patients in Inpatient Rehabilitation Facilities with attached "Privacy Act Statement-Health Care Records" was provided and verbally reviewed with: N/A  Emergency Contact Information Contact Information     Name Relation Home Work Mobile   Bole,[name] Spouse   [phone]       Current Medical History  Patient Admitting Diagnosis: CVA  History of Present Illness:  35 year old right-handed male with history of obesity with BMI 36.62.  Per chart review patient lives with spouse including 42-month-old daughter.  1 level home 3 steps to entry.  Independent prior to admission.  Patient works full-time from home.  Presented to [facility] 09/09/2020 with left-sided numbness, headache dizziness as well as nausea x1 week.  Per report recently presented to chiropractor 2 weeks ago for regular back adjustments.  He denied having any symptoms at that time however he has had some persistent on and off neck pain x3 weeks.  MRI of the brain showed small acute stroke of the lateral left medulla.  CTA head and neck irregularity of  the V2 segment of the left vertebral artery with sites of up to mild/moderate stenosis.  Irregularity of the V4 left vertebral artery with sites of up to moderate/severe stenosis.  MRI of the brain follow-up more conspicuous signal abnormality in the left medulla that could possibly be evolutionary change of previous stroke.  CT angiogram head and neck repeated 09/11/2020 showing persistent findings of left vertebral artery dissection extracranially and intracranially.  Slightly increased narrowing at the level of the C2 transverse foramen.  Neurology follow-up currently maintained on aspirin and Plavix for CVA prophylaxis x3 months.  Patient would require repeat vascular imaging in 3 months to determine need to continue DAPT.  Tolerating a regular consistency diet  Complete NIHSS TOTAL: 2  Patient's medical record from [facility] has been reviewed by the rehabilitation admission coordinator and physician.  Past Medical History  History reviewed. No pertinent past medical history.  Family History   family history is not on file.  Prior Rehab/Hospitalizations Has the patient had prior rehab or hospitalizations prior to admission? Yes  Has the patient had major surgery during 100 days prior to admission? No   Current Medications  Current Facility-Administered Medications:    0.9 %  sodium chloride infusion, , Intravenous, PRN, Leroy Sea, MD, Last  Rate: 10 mL/hr at 09/12/20 0640, 10 mL at 09/12/20 0640   acetaminophen-codeine (TYLENOL #3) 300-30 MG per tablet 1 tablet, 1 tablet, Oral, Q6H PRN, Leroy SeaSingh, Prashant K, MD, 1 tablet at 09/13/20 0806   alum & mag hydroxide-simeth (MAALOX/MYLANTA) 200-200-20 MG/5ML suspension 30 mL, 30 mL, Oral, Q4H PRN, Mansy, Jan A, MD, 30 mL at 09/12/20 0017   [COMPLETED] aspirin chewable tablet 325 mg, 325 mg, Oral, Once, 325 mg at 09/09/20 1127 **FOLLOWED BY** aspirin EC tablet 81 mg, 81 mg, Oral, Daily, Cox, Amy N, DO, 81 mg at 09/13/20 0806   calcium carbonate  (TUMS - dosed in mg elemental calcium) chewable tablet 200 mg of elemental calcium, 1 tablet, Oral, TID PRN, Alberteen Samanford, Christopher P, MD, 200 mg of elemental calcium at 09/12/20 0224   chlorproMAZINE (THORAZINE) tablet 25 mg, 25 mg, Oral, QID PRN, Mansy, Jan A, MD, 25 mg at 09/12/20 1638   [COMPLETED] clopidogrel (PLAVIX) tablet 300 mg, 300 mg, Oral, Once, 300 mg at 09/09/20 1609 **FOLLOWED BY** clopidogrel (PLAVIX) tablet 75 mg, 75 mg, Oral, Daily, Cox, Amy N, DO, 75 mg at 09/13/20 0806   gabapentin (NEURONTIN) capsule 300 mg, 300 mg, Oral, TID, Jefferson FuelStack, Colleen M, MD, 300 mg at 09/13/20 [id number]   meclizine (ANTIVERT) tablet 50 mg, 50 mg, Oral, TID, Leroy SeaSingh, Prashant K, MD, 50 mg at 09/13/20 0806   ondansetron (ZOFRAN) injection 4 mg, 4 mg, Intravenous, Q6H PRN, Leroy SeaSingh, Prashant K, MD   pantoprazole (PROTONIX) EC tablet 40 mg, 40 mg, Oral, BID, Cox, Amy N, DO, 40 mg at 09/13/20 0806   phenol (CHLORASEPTIC) mouth spray 1 spray, 1 spray, Mouth/Throat, PRN, Leroy SeaSingh, Prashant K, MD, 1 spray at 09/12/20 0931   promethazine (PHENERGAN) 25 mg in sodium chloride 0.9 % 50 mL IVPB, 25 mg, Intravenous, Q8H PRN, Danford, Earl Liteshristopher P, MD, Last Rate: 200 mL/hr at 09/12/20 0642, 25 mg at 09/12/20 [id number]   senna-docusate (Senokot-S) tablet 1 tablet, 1 tablet, Oral, QHS PRN, Cox, Amy N, DO  Patients Current Diet:  Diet Order             Diet - low sodium heart healthy           Diet Heart Room service appropriate? Yes; Fluid consistency: Thin  Diet effective now                  Precautions / Restrictions Precautions Precautions: Fall Restrictions Weight Bearing Restrictions: No   Has the patient had 2 or more falls or a fall with injury in the past year? No  Prior Activity Level Community (5-7x/wk): Independent  Prior Functional Level Self Care: Did the patient need help bathing, dressing, using the toilet or eating? Independent  Indoor Mobility: Did the patient need assistance with walking from room to  room (with or without device)? Independent  Stairs: Did the patient need assistance with internal or external stairs (with or without device)? Independent  Functional Cognition: Did the patient need help planning regular tasks such as shopping or remembering to take medications? Independent  Home Assistive Devices / Equipment Home Assistive Devices/Equipment: None Home Equipment: Shower seat - built in  Prior Device Use: Indicate devices/aids used by the patient prior to current illness, exacerbation or injury? None of the above  Current Functional Level Cognition  Arousal/Alertness: Awake/alert Overall Cognitive Status: Within Functional Limits for tasks assessed Orientation Level: Oriented X4 General Comments: eyes closed during session due to dizziness/nausea. Rates dizziness at 8/10 increasing to 10/10 with any head movement  Attention: Focused, Sustained Focused Attention: Appears intact Sustained Attention: Appears intact Memory: Appears intact Awareness: Appears intact Behaviors:  (n/a) Safety/Judgment: Appears intact    Extremity Assessment (includes Sensation/Coordination)  Upper Extremity Assessment: LUE deficits/detail (R UE grossly 5/5) RUE Deficits / Details: Pt endorses tingling to hands/forearm. LUE Deficits / Details: +pronator drift. Good RAMP. Sensation intact, however endorses tingling  Lower Extremity Assessment: Overall WFL for tasks assessed (endorse tingling in L LE and weakness, although apparantly feels weakness with functional mobility)    ADLs  Overall ADL's : Needs assistance/impaired General ADL Comments: Pt completed ADL session wearing eye patch on R eye. MIN A + RW hand washing standing sinkside - pt tolerates ~1 min standing grooming prior to requesting to sit r/t dizziness. Pt toelrated x3 standing trials c MIN A + RW - assist for L lateral lean in standing. Initial MIN A + RW for side steps at EOB decreasing to MOD A + RW for forward/backward steps  at EOB.    Mobility  Overal bed mobility: Modified Independent General bed mobility comments: pt able to perform several log rolls prior to mobility transition. Approx 3/10 dizziness noted with transition to EOB. Once seated assisted in adjusting to upright midline posture as he tends to still present with L lateral lean.    Transfers  Overall transfer level: Needs assistance Equipment used: Rolling walker (2 wheeled) Transfers: Sit to/from Stand Sit to Stand: Min assist Stand pivot transfers: Min assist General transfer comment: able to stand with cues for hand placement. Once standing assisted with midline posture and safe BOS. Static standing balance improved    Ambulation / Gait / Stairs / Wheelchair Mobility  Ambulation/Gait Ambulation/Gait assistance: Min assist, Mod assist, +2 safety/equipment Gait Distance (Feet): 75 Feet Assistive device: Rolling walker (2 wheeled) Gait Pattern/deviations: Step-to pattern General Gait Details: Effortful gait requiring RW and L lateral lean and several LOB requiring occasional mod assist for correction. With slow gait, balance improves, however with any challenge in speed, LOB noted with decreased ability to correct. Fatigues with increased distance with 1 standing rest break required    Posture / Balance Dynamic Sitting Balance Sitting balance - Comments: cues for upright midline posture Balance Overall balance assessment: Needs assistance Sitting-balance support: No upper extremity supported, Feet supported Sitting balance-Leahy Scale: Fair Sitting balance - Comments: cues for upright midline posture Postural control: Left lateral lean Standing balance support: Bilateral upper extremity supported Standing balance-Leahy Scale: Fair Standing balance comment: zero for dynamic standing    Special needs/care consideration    Previous Home Environment  Living Arrangements: Spouse/significant other, Children  Lives With: Spouse,  Daughter Available Help at Discharge: Family, Available 24 hours/day Type of Home: House Home Layout: One level Home Access: Stairs to enter Entrance Stairs-Rails: Left Entrance Stairs-Number of Steps: 3 Bathroom Shower/Tub: Health visitor: Standard Bathroom Accessibility: Yes How Accessible: Accessible via walker Home Care Services: No  Discharge Living Setting Plans for Discharge Living Setting: Patient's home, Lives with (comment) (wife and 11 month old daughter) Type of Home at Discharge: House Discharge Home Layout: One level Discharge Home Access: Stairs to enter Entrance Stairs-Rails: Left Entrance Stairs-Number of Steps: 3 Discharge Bathroom Shower/Tub: Walk-in shower Discharge Bathroom Toilet: Standard Discharge Bathroom Accessibility: Yes How Accessible: Accessible via walker Does the patient have any problems obtaining your medications?: No  Social/Family/Support Systems Patient Roles: Spouse, Parent (employee) Contact Information: wife, [name] Anticipated Caregiver: wife Anticipated Caregiver's Contact Information: see above Ability/Limitations of Caregiver: wife with 2  month old child Caregiver Availability: 24/7 Discharge Plan Discussed with Primary Caregiver: Yes Is Caregiver In Agreement with Plan?: Yes Does Caregiver/Family have Issues with Lodging/Transportation while Pt is in Rehab?: No  Goals Patient/Family Goal for Rehab: supervision with PT and OT Expected length of stay: ELOS 2 to 3 weeks Pt/Family Agrees to Admission and willing to participate: Yes Program Orientation Provided & Reviewed with Pt/Caregiver Including Roles  & Responsibilities: Yes  Decrease burden of Care through IP rehab admission: n/a  Possible need for SNF placement upon discharge: not anticipated  Patient Condition: I have reviewed medical records from [facility], spoken with CM, and patient and spouse. I discussed via phone for inpatient rehabilitation assessment.   Patient will benefit from ongoing PT and OT, can actively participate in 3 hours of therapy a day 5 days of the week, and can make measurable gains during the admission.  Patient will also benefit from the coordinated team approach during an Inpatient Acute Rehabilitation admission.  The patient will receive intensive therapy as well as Rehabilitation physician, nursing, social worker, and care management interventions.  Due to bladder management, bowel management, safety, skin/wound care, disease management, medication administration, pain management, and patient education the patient requires 24 hour a day rehabilitation nursing.  The patient is currently min to mod asisst with mobility and basic ADLs.  Discharge setting and therapy post discharge at home with outpatient is anticipated.  Patient has agreed to participate in the Acute Inpatient Rehabilitation Program and will admit today.  Preadmission Screen Completed By:  Clois Dupes, 09/13/2020 10:53 AM ______________________________________________________________________   Discussed status with Dr. Riley Kill on  09/13/2020 at 1053 and received approval for admission today.  Admission Coordinator:  Clois Dupes, RN, time 7014 Date  09/13/2020   Assessment/Plan: Diagnosis: left lateral medullar infarct Does the need for close, 24 hr/day Medical supervision in concert with the patient's rehab needs make it unreasonable for this patient to be served in a less intensive setting? Yes Co-Morbidities requiring supervision/potential complications: obesity, post-stroke sequelae Due to bladder management, bowel management, safety, skin/wound care, disease management, medication administration, pain management, and patient education, does the patient require 24 hr/day rehab nursing? Yes Does the patient require coordinated care of a physician, rehab nurse, PT, OT to address physical and functional deficits in the context of the above medical  diagnosis(es)? Yes Addressing deficits in the following areas: balance, endurance, locomotion, strength, transferring, bowel/bladder control, bathing, dressing, feeding, grooming, toileting, and psychosocial support Can the patient actively participate in an intensive therapy program of at least 3 hrs of therapy 5 days a week? Yes The potential for patient to make measurable gains while on inpatient rehab is excellent Anticipated functional outcomes upon discharge from inpatient rehab: supervision PT, supervision OT, n/a SLP Estimated rehab length of stay to reach the above functional goals is: 2-3 weeks Anticipated discharge destination: Home 10. Overall Rehab/Functional Prognosis: excellent   MD Signature: [name], MD, [facility] Physical Medicine & Rehabilitation 09/13/2020

## 2020-09-11 NOTE — TOC Initial Note (Signed)
Transition of Care [facility]) - Initial/Assessment Note    Patient Details  Name: Curtis [name] MRN: [id number] Date of Birth: 1986-01-04  Transition of Care [facility]) CM/SW Contact:    Shelbie Hutching, RN Phone Number: 09/11/2020, 1:08 PM  Clinical Narrative:                 Patient admitted to the hospital with vertebral artery dissection and stroke.  RNCM met with patient and patient's parents at the bedside.  Patient reports that he lives with his wife at home in [place].  Therapy recommends CIR and patient would like to do this.  CIR is following patient for admission.  Patient will be getting repeat CT and MRI today.    Expected Discharge Plan: IP Rehab Facility Barriers to Discharge: Continued Medical Work up   Patient Goals and CMS Choice Patient states their goals for this hospitalization and ongoing recovery are:: Patient wants to go to CIR when medically ready CMS Medicare.gov Compare Post Acute Care list provided to:: Patient Choice offered to / list presented to : Patient  Expected Discharge Plan and Services Expected Discharge Plan: [place]   Discharge Planning Services: CM Consult Post Acute Care Choice: IP Rehab Living arrangements for the past 2 months: Single Family Home                 DME Arranged: N/A DME Agency: NA       HH Arranged: NA HH Agency: NA        Prior Living Arrangements/Services Living arrangements for the past 2 months: Single Family Home Lives with:: Spouse Patient language and need for interpreter reviewed:: Yes Do you feel safe going back to the place where you live?: Yes      Need for Family Participation in Patient Care: Yes (Comment) (stroke) Care giver support system in place?: Yes (comment) (wife, parents)   Criminal Activity/Legal Involvement Pertinent to Current Situation/Hospitalization: No - Comment as needed  Activities of Daily Living Home Assistive Devices/Equipment: None ADL Screening (condition at time of  admission) Patient's cognitive ability adequate to safely complete daily activities?: Yes Is the patient deaf or have difficulty hearing?: No Does the patient have difficulty seeing, even when wearing glasses/contacts?: No Does the patient have difficulty concentrating, remembering, or making decisions?: No Patient able to express need for assistance with ADLs?: Yes Does the patient have difficulty dressing or bathing?: No Independently performs ADLs?: No Communication: Independent Dressing (OT): Needs assistance Is this a change from baseline?: Change from baseline, expected to last >3 days Grooming: Needs assistance Is this a change from baseline?: Change from baseline, expected to last >3 days Feeding: Independent Bathing: Needs assistance Is this a change from baseline?: Change from baseline, expected to last >3 days Toileting: Needs assistance In/Out Bed: Needs assistance Is this a change from baseline?: Change from baseline, expected to last >3 days Walks in Home: Needs assistance Is this a change from baseline?: Change from baseline, expected to last >3 days Does the patient have difficulty walking or climbing stairs?: Yes Weakness of Legs: Both Weakness of Arms/Hands: Both  Permission Sought/Granted Permission sought to share information with : Case Manager, Family Supports, Chartered certified accountant granted to share information with : Yes, Verbal Permission Granted  Share Information with NAME: [name]  Permission granted to share info w AGENCY: CIR  Permission granted to share info w Relationship: wife     Emotional Assessment Appearance:: Appears stated age Attitude/Demeanor/Rapport: Engaged Affect (typically observed):  Accepting Orientation: : Oriented to Self, Oriented to Place, Oriented to  Time, Oriented to Situation Alcohol / Substance Use: Not Applicable Psych Involvement: No (comment)  Admission diagnosis:  Vertebral artery dissection (HCC)  [I77.74] Stroke [facility]) [I63.9] Patient Active Problem List   Diagnosis Date Noted   Stroke ([place]) 09/10/2020   Lateral medullary syndrome    Vertebral artery dissection ([place]) 09/09/2020   Obesity (BMI 35.0-39.9 without comorbidity) 09/09/2020   Essential hypertension 09/09/2020   Acute stroke of medulla oblongata ([place])    PCP:  Pcp, No Pharmacy:   [id number] [place] [place]Phone: [phone]: [phone]    Social Determinants of Health (SDOH) Interventions    Readmission Risk Interventions No flowsheet data found.

## 2020-09-11 NOTE — Progress Notes (Addendum)
Neurology Progress Note  Patient ID: 35 yo man with L vertebral dissection (intracranial and extracranial) after cervical chiropractic manipulation resulting in a L lateral medullary stroke.   Subjective: - Nystagmus and diplopia persist - Dizziness improved on meclizine but not resolved. - Headache returned. MAR reviewed and gabapentin listed as 300mg  tid prn. Changed order to scheduled. - Nausea persisted on zofran, phenergan to be increased - Increased BUE numbness, new subtle L delt weakness on exam. STAT CT/CTA and routine MRI brain ordered.   Exam: Vitals:   09/11/20 0503 09/11/20 0927  BP: 105/74 114/73  Pulse: 72 73  Resp: 16 16  Temp: 97.6 F (36.4 C) (!) 97.4 F (36.3 C)  SpO2: 100% 98%   Physical Exam Gen: A&O x4, NAD HEENT: Atraumatic, normocephalic;mucous membranes moist; oropharynx clear, tongue without atrophy or fasciculations. Neck: Supple, trachea midline. Resp: CTAB, no w/r/r CV: RRR, no m/g/r; nml S1 and S2. 2+ symmetric peripheral pulses. Abd: soft/NT/ND; nabs x 4 quad Extrem: Nml bulk; no cyanosis, clubbing, or edema.   Neuro: *MS: A&O x4. Follows multi-step commands.  *Speech: fluid, nondysarthric, able to name and repeat *CN:    I: Deferred   II,III: PERRLA, VFF by confrontation, optic discs not visualized 2/2 pupillary constriction   III,IV,VI: EOMI w/ nystagmus on L lateral gaze, no ptosis   V: Sensation intact from V1 to V3 to LT   VII: Eyelid closure was full.  Smile symmetric.   VIII: Hearing intact to voice   IX,X: Voice normal, palate elevates symmetrically    XI: SCM/trap 5/5 bilat   XII: Tongue protrudes midline, no atrophy or fasciculations    *Motor:   Normal bulk.  No tremor, rigidity or bradykinesia. No pronator drift.     Strength: Dlt Bic Tri WrE WrF FgS Gr HF KnF KnE PlF DoF    Left 4+ 5 5 5 5 5 5 5 5 5 5 5     Right 5 5 5 5 5 5 5 5 5 5 5 5       *Sensory: Intact to light touch, pinprick, temperature vibration throughout.  Symmetric. Propioception intact bilat.  No double-simultaneous extinction. Subjective paresthesias BUE without objective sensory deficit *Coordination:  ataxia on FNF and HTS on L side only *Reflexes:  2+ and symmetric throughout without clonus; toes down-going bilat *Gait: deferred.   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 His vertigo, nystagmus (causing "bouncing vision" sensation, diplopia, and difficulty focusing), nausea, and intermittent hiccups are secondary to the stroke in this location. His headache has returned today which is likely a result of his gabapentin being prn instead of scheduled tid. I fixed the order to scheduled. Given the return of his headache and new L delt weakness I ordered STAT CT head and CTA H&N to be f/b routine MRI brain to rule out further dissection or extension of infarct.  Recommendations: - STAT CT head and CTA H&N to be f/b routine MRI (ordered) - DAPT x3 mos. ASA 81mg  daily + plavix 75mg  daily. Anticoagulation inappropriate for this patient 2/2 SAH risk with his intracranial and extracranial vert dissection - At 35 mos patient should have repeat vascular imaging to determine need to continue DAPT. - Avoid chiropractic manipulation, contact sports, any any activity that involves abrupt rotation and flexion-extension of neck - Goal normotension unless new stroke identified on MRI brain. - Check LDL and A1c for  overall stroke risk factor modification - Gapapentin 300mg  tid for headache (scheduled not prn) - Prn zofran +/- phenergan and meclizine for nausea and vertigo, respectively - Eye patch for patient to alternate eyes to improve diplopia and nausea in the acute setting - Will continue to follow  , MD [facility] [phone]  If 7pm- 7am, please page neurology on call as listed in AMION.  Addendum  8/17 at 1512: CTA redemonstrates intracranial and extracranial vert dissection, unchanged

## 2020-09-11 NOTE — Progress Notes (Addendum)
Occupational Therapy Treatment Patient Details Name: Curtis [name] MRN: [id number] DOB: [birth year] Today's Date: 09/11/2020    History of present illness Pt is a 35 y.o. male with medical history significant for obesity, presents emergency department for chief concerns of left-sided numbness and headache on the left side.  He reports that the left-sided numbness, left-sided headache, dizziness, and nausea started after taking 1 dose of muscle relaxer at approximately 5 AM on day of presentation.   He further endorses occasional double vision, blurry vision that is beyond his baseline vision changes that requires corrective glasses. He endorses imbalance and dizziness which is severe per pt report. 09/10/20 MRI: Small acute stroke of the lateral left medulla.  Curtis of Cervical Spine: Possible crescentic abnormal signal is present about the left V2 vertebral  artery at the C3-C4 to C5 levels   OT comments  Curtis [name] was seen for OT treatment on this date. Upon arrival to room pt reclined in bed with father at bedside, agreeable to session. Pt reports improved nausea this date, continues to report dizziness. RN reported eye patch placed on pt per orders however eye patch in room does not fully occlude vision - PT notified to retrieve eye patch for next session.   MIN A for ADL t/f - assist for postural sway and moderate LOBs - unable to progress to steps - attempted marching in place and pt requires MAX A to correct LOB. Pt tolerated x3 standing trials with MIN A to achieve midline in standing, reports increased dizziness on 3rd attempt only. Single UE support + CGA pulling up shorts in standing and functional reach outside BOS. Pt and family instructed on seated HEP for safe functional strengthening, theraband provided.   Pt making good progress toward goals. Pt continues to benefit from skilled OT services to maximize return to PLOF and minimize risk of future falls, injury, caregiver burden, and  readmission. Will continue to follow POC. Discharge recommendation remains appropriate.    Follow Up Recommendations  CIR    Equipment Recommendations  Other (comment) (TBD next venue of care)    Recommendations for Other Services Rehab consult    Precautions / Restrictions Precautions Precautions: Fall Restrictions Weight Bearing Restrictions: No       Mobility Bed Mobility Overal bed mobility: Modified Independent             General bed mobility comments: performs eyes closed    Transfers Overall transfer level: Needs assistance Equipment used: Rolling walker (2 wheeled) Transfers: Sit to/from [employer] Sit to Stand: Min guard Stand pivot transfers: Min assist       General transfer comment: unable to progress to steps - attempted marching in place and pt requires MAX A to correct LOB.    Balance Overall balance assessment: Needs assistance Sitting-balance support: No upper extremity supported;Feet supported Sitting balance-Leahy Scale: Fair     Standing balance support: Single extremity supported;During functional activity Standing balance-Leahy Scale: Fair                             ADL either performed or assessed with clinical judgement   ADL Overall ADL's : Needs assistance/impaired                                       General ADL Comments: Single UE support + CGA pulling up  shorts in standing and functional reach outside BOS. MIN A for ADL t/f - assist for postural sway and moderate LOBs.      Cognition Arousal/Alertness: Awake/alert Behavior During Therapy: WFL for tasks assessed/performed Overall Cognitive Status: Within Functional Limits for tasks assessed                                          Exercises Exercises: Other exercises Other Exercises Other Exercises: Pt and family edcuated re: DME recs, d/c recs, stroke education, falls prevention, HEP Other Exercises:  LBD, grooming, sup>sit, sit<>stand, SPT, sitting/standing balance/tolerance, functinoal reach           Pertinent Vitals/ Pain       Pain Assessment: No/denies pain   Frequency  Min 3X/week        Progress Toward Goals  OT Goals(current goals can now be found in the care plan section)  Progress towards OT goals: Progressing toward goals  Acute Rehab OT Goals Patient Stated Goal: to return to PLOF OT Goal Formulation: With patient/family Time For Goal Achievement: 09/24/20 Potential to Achieve Goals: Good ADL Goals Pt Will Perform Grooming: with modified independence;standing Pt Will Perform Lower Body Dressing: sit to/from stand;Independently Pt Will Transfer to Toilet: with modified independence;ambulating;bedside commode Additional ADL Goal #1: Pt will Independently verbalize plan to implement x3 falls prevnetion strategies  Plan Discharge plan remains appropriate;Frequency remains appropriate       AM-PAC OT "6 Clicks" Daily Activity     Outcome Measure   Help from another person eating meals?: None Help from another person taking care of personal grooming?: A Little Help from another person toileting, which includes using toliet, bedpan, or urinal?: A Lot Help from another person bathing (including washing, rinsing, drying)?: A Lot Help from another person to put on and taking off regular upper body clothing?: A Little Help from another person to put on and taking off regular lower body clothing?: A Little 6 Click Score: 17    End of Session Equipment Utilized During Treatment: Rolling walker  OT Visit Diagnosis: Other abnormalities of gait and mobility (R26.89)   Activity Tolerance Patient tolerated treatment well   Patient Left in chair;with call bell/phone within reach;with family/visitor present (pt refused chair alarm)   Nurse Communication Mobility status        Time: 5732-2025 OT Time Calculation (min): 24 min  Charges: OT General Charges $OT  Visit: 1 Visit OT Treatments $Self Care/Home Management : 8-22 mins $Therapeutic Exercise: 8-22 mins  Kathie Dike, M.S. OTR/L  09/11/20, 10:54 AM  ascom [phone]'

## 2020-09-11 NOTE — Progress Notes (Addendum)
Neurology Update  Please see progress note by me from today for patient summary. This is a 35 yo gentleman with acute L lateral medullary ischemic infarct 2/2 L vert dissection.  MRI brain reviewed. Demonstrates evolution of known L lateral medullary ischemic infarct with some petechial blood products. Extension of ischemic infarct cannot be ruled out. Given the petechial hemorrhage I d/c'd his DVT prophylaxis. He should continue on dual antiplatelet for now. Avoid further NSAIDs. Will increase neurochecks to q 4 hrs and repeat head CT in 6 hrs to be followed up on by overnight covering hospitalist. If there is significant blood on CT or expansion of blood products seen on MRI brain patient should be transferred to ICU and STAT consult to teleneurology placed. If there is any acute change in neurologic status overnight inpatient stroke code with CT head and CTA H&N should be activated (also teleneurology).  D/w wife Curtis [name] by phone and Dr. Thedore Mins by phone.    Bing Neighbors, MD [facility] [phone]  If 7pm- 7am, please page neurology on call as listed in AMION.

## 2020-09-11 NOTE — Progress Notes (Signed)
Physical Therapy Treatment Patient Details Name: Curtis [name] MRN: [id number] DOB: 1985/05/17 Today's Date: 09/11/2020    History of Present Illness Pt is a 35 y.o. male with medical history significant for obesity, presents emergency department for chief concerns of left-sided numbness and headache on the left side.  He reports that the left-sided numbness, left-sided headache, dizziness, and nausea started after taking 1 dose of muscle relaxer at approximately 5 AM on day of presentation.   He further endorses occasional double vision, blurry vision that is beyond his baseline vision changes that requires corrective glasses. He endorses imbalance and dizziness which is severe per pt report. 09/10/20 MRI: Small acute stroke of the lateral left medulla.  MR of Cervical Spine: Possible crescentic abnormal signal is present about the left V2 vertebral  artery at the C3-C4 to C5 levels    PT Comments    Pt is making good progress towards goals with eye patch donned this date to assist in symptoms. Still presents with L UE weakness compared to R side. Able to perform vestibular accomodation strategies in seated position with eye patch over R eye. Session cut short as pt needing to go for repeat stat imaging. Reports he was able to keep food down this date. Remains very motivated to participate and is excited about potential CIR admission. Will continue to progress as able.    Follow Up Recommendations  CIR     Equipment Recommendations  Rolling walker with 5" wheels    Recommendations for Other Services Rehab consult     Precautions / Restrictions Precautions Precautions: Fall Restrictions Weight Bearing Restrictions: No    Mobility  Bed Mobility Overal bed mobility: Modified Independent             General bed mobility comments: able to keep eyes open this date, performs with cues for log roll and to keep head/neck straight    Transfers                 General  transfer comment: unable to perform as RN entered for stat IV placement and repeat imaging. deferred to next session  Ambulation/Gait             General Gait Details: not safe to perform today   Stairs             Wheelchair Mobility    Modified Rankin (Stroke Patients Only)       Balance Overall balance assessment: Needs assistance Sitting-balance support: No upper extremity supported;Feet supported Sitting balance-Leahy Scale: Fair                                      Cognition Arousal/Alertness: Awake/alert Behavior During Therapy: WFL for tasks assessed/performed Overall Cognitive Status: Within Functional Limits for tasks assessed                                        Exercises Other Exercises Other Exercises: vestibular compensation strategies performed in supine and seated position. HEP given and reviewed. Onset of dizziness with change of position, however improves with reps. 10 reps performed and educated on frequency and duration.    General Comments        Pertinent Vitals/Pain Pain Assessment: No/denies pain    Home Living  Prior Function            PT Goals (current goals can now be found in the care plan section) Acute Rehab PT Goals Patient Stated Goal: to return to PLOF PT Goal Formulation: With patient Time For Goal Achievement: 09/24/20 Potential to Achieve Goals: Good Progress towards PT goals: Progressing toward goals    Frequency    7X/week      PT Plan Current plan remains appropriate    Co-evaluation              AM-PAC PT "6 Clicks" Mobility   Outcome Measure  Help needed turning from your back to your side while in a flat bed without using bedrails?: A Little Help needed moving from lying on your back to sitting on the side of a flat bed without using bedrails?: A Little Help needed moving to and from a bed to a chair (including a  wheelchair)?: A Lot Help needed standing up from a chair using your arms (e.g., wheelchair or bedside chair)?: A Lot Help needed to walk in hospital room?: Total Help needed climbing 3-5 steps with a railing? : Total 6 Click Score: 12    End of Session Equipment Utilized During Treatment: Gait belt Activity Tolerance: Patient tolerated treatment well Patient left: in bed;with family/visitor present Nurse Communication: Mobility status PT Visit Diagnosis: Unsteadiness on feet (R26.81);Muscle weakness (generalized) (M62.81);History of falling (Z91.81);Difficulty in walking, not elsewhere classified (R26.2);Dizziness and giddiness (R42)     Time: 9629-5284 PT Time Calculation (min) (ACUTE ONLY): 27 min  Charges:  $Therapeutic Exercise: 23-37 mins                     Elizabeth Palau, [place], DPT [phone]    Curtis [name] 09/11/2020, 3:01 PM

## 2020-09-11 NOTE — Progress Notes (Signed)
Inpatient Rehabilitation Admissions Coordinator   Inpatient rehab consult received. I spoke with patient and his wife by phone for rehab assessment. We discussed goals and expectations of a possible CIR admit. They both prefer CIR prior to return home. Once I have updated PT treatment from today, I will begin Auth with [employer] for a possible admit pending approval and bed availability when he is ready for d/c. Acute tem and TOC made aware.  Ottie Glazier, RN, MSN Rehab Admissions Coordinator [phone] 09/11/2020 2:12 PM

## 2020-09-11 NOTE — Progress Notes (Signed)
[facility] PROGRESS NOTE    Curtis [name]  ZWC:[id number] DOB: [birth year] DOA: 09/09/2020 PCP: Pcp, No      Brief Narrative:  Curtis [name] is a 35 y.o. M with obesity no other significant PMHx presented with acute onset nausea, dizziness.  Patient had recently seen a chiropractor who is being treated with cervical manipulation.  On the morning of admission he developed sudden dizziness, nausea, neck pain, and headache and so he came to the ER.  CT angiogram was obtained that showed findings consistent with dissection of the left vertebral artery.  MRI brain showed small ischemic infarct in the left lateral medulla.  Subjective:  Patient in bed having some left-sided occipital and temporal headache, still has mild diplopia, denies any new weakness.  Assessment & Plan:  Acute left lateral Medulla ischemic stroke due to L. vertebral artery dissection -Non-invasive angiography showed vertebral artery aneurysm - Stroke work-up being done by neurology team, repeat MRI and CTA on 09/11/2020.  Currently on dual antiplatelet therapy for 3 months, after 3 months Plavix only, LDL and A1c at goal.  Continue PT OT May require CIR.  For now continue permissive hypertension.    Will require repeat vascular imaging in 3 months.   Vertigo & Diplopia  due to above  - These are severe and debilitating at present.  Supportive care with Ativan, meclizine and Phenergan.  Continue PT OT.  May require CIR.  Neurontin for headaches.  Case discussed with neurologist Dr. Selina [name] on 09/11/2020.    Obesity - BMI 36 with PCP for weight loss.  GERD - Continue PPI   Leukocytosis - Likely reactive, no suspected infection, trend improving.     Disposition: Status is: Inpatient  Remains inpatient appropriate because: he has severe and debilitating vertigo and diplopia, not clearly taking fluids  Dispo: The patient is from: Home              Anticipated d/c is to: CIR              Patient  currently is not medically stable to d/c.   Difficult to place patient No  MDM: The below labs and imaging reports were reviewed and summarized above.  Medication management as above.     DVT prophylaxis: SCD's Start: 09/09/20 1213  Code Status: FULL Family Communication: Wife, father    Consultants:  Neurology  Procedures:  8/15 CT angiogram of the head and neck --left vertebral artery dissection 8/15 MRI brain and cervical spine --left medullary stroke  Antimicrobials:     Culture data:      Objective: Vitals:   09/10/20 2005 09/11/20 0048 09/11/20 0503 09/11/20 0927  BP: (!) 144/88 122/72 105/74 114/73  Pulse: 80 87 72 73  Resp: 16 16 16 16   Temp: 98.1 F (36.7 C) 98 F (36.7 C) 97.6 F (36.4 C) (!) 97.4 F (36.3 C)  TempSrc:    Oral  SpO2: 100% 98% 100% 98%  Weight:      Height:        Intake/Output Summary (Last 24 hours) at 09/11/2020 1047 Last data filed at 09/11/2020 1015 Gross per 24 hour  Intake 240 ml  Output --  Net 240 ml   Filed Weights   09/09/20 0631 09/09/20 0800  Weight: 122.5 kg 122.5 kg    Examination:  Awake Alert, No new F.N deficits, possible minimal left-sided weakness, still has mild diplopia, nystagmus improved, .AT,PERRAL Supple Neck,No JVD, No cervical lymphadenopathy appriciated.  Symmetrical Chest  wall movement, Good air movement bilaterally, CTAB RRR,No Gallops, Rubs or new Murmurs, No Parasternal Heave +ve B.Sounds, Abd Soft, No tenderness, No organomegaly appriciated, No rebound - guarding or rigidity. No Cyanosis, Clubbing or edema, No new Rash or bruise     Data Reviewed: I have personally reviewed following labs and imaging studies:  CBC: Recent Labs  Lab 09/09/20 0758 09/10/20 0551 09/11/20 0714  WBC 18.4* 26.6* 15.1*  HGB 17.0 16.9 16.7  HCT 49.1 47.1 48.3  MCV 85.8 86.1 87.3  PLT 327 292 280   Basic Metabolic Panel: Recent Labs  Lab 09/09/20 0758 09/10/20 0551 09/11/20 0714  NA 140 136  139  K 3.8 3.9 4.0  CL 106 101 105  CO2 26 27 25   GLUCOSE 144* 109* 98  BUN 20 17 14   CREATININE 0.85 0.58* 0.73  CALCIUM 9.0 9.1 9.0  MG  --   --  2.3   GFR: Estimated Creatinine Clearance: 177.6 mL/min (by C-G formula based on SCr of 0.73 mg/dL). Liver Function Tests: Recent Labs  Lab 09/09/20 0758 09/10/20 0551  AST 15 14*  ALT 15 13  ALKPHOS 72 75  BILITOT 0.9 1.1  PROT 7.2 7.2  ALBUMIN 4.1 4.0   No results for input(s): LIPASE, AMYLASE in the last 168 hours. No results for input(s): AMMONIA in the last 168 hours. Coagulation Profile: No results for input(s): INR, PROTIME in the last 168 hours. Cardiac Enzymes: No results for input(s): CKTOTAL, CKMB, CKMBINDEX, TROPONINI in the last 168 hours. BNP (last 3 results) No results for input(s): PROBNP in the last 8760 hours. HbA1C: Recent Labs    09/09/20 1458 09/10/20 0551  HGBA1C 5.5 5.4   CBG: Recent Labs  Lab 09/09/20 0656 09/09/20 1745 09/10/20 0744 09/10/20 1235 09/10/20 1627  GLUCAP 150* 129* 101* 106* 75   Lipid Profile: Recent Labs    09/10/20 0551  CHOL 148  HDL 63  LDLCALC 67  TRIG 90  CHOLHDL 2.3   Thyroid Function Tests: No results for input(s): TSH, T4TOTAL, FREET4, T3FREE, THYROIDAB in the last 72 hours. Anemia Panel: No results for input(s): VITAMINB12, FOLATE, FERRITIN, TIBC, IRON, RETICCTPCT in the last 72 hours. Urine analysis:    Component Value Date/Time   COLORURINE STRAW (A) 09/09/2020 1141   APPEARANCEUR CLEAR (A) 09/09/2020 1141   LABSPEC 1.032 (H) 09/09/2020 1141   PHURINE 7.0 09/09/2020 1141   GLUCOSEU NEGATIVE 09/09/2020 1141   HGBUR NEGATIVE 09/09/2020 1141   BILIRUBINUR NEGATIVE 09/09/2020 1141   KETONESUR NEGATIVE 09/09/2020 1141   PROTEINUR NEGATIVE 09/09/2020 1141   NITRITE NEGATIVE 09/09/2020 1141   LEUKOCYTESUR NEGATIVE 09/09/2020 1141   Sepsis Labs: @LABRCNTIP (procalcitonin:4,lacticacidven:4)  ) Recent Results (from the past 240 hour(s))  SARS  CORONAVIRUS 2 (TAT 6-24 HRS) Nasopharyngeal Nasopharyngeal Swab     Status: None   Collection Time: 09/09/20  2:33 PM   Specimen: Nasopharyngeal Swab  Result Value Ref Range Status   SARS Coronavirus 2 NEGATIVE NEGATIVE Final    Comment: (NOTE) SARS-CoV-2 target nucleic acids are NOT DETECTED.  The SARS-CoV-2 RNA is generally detectable in upper and lower respiratory specimens during the acute phase of infection. Negative results do not preclude SARS-CoV-2 infection, do not rule out co-infections with other pathogens, and should not be used as the sole basis for treatment or other patient management decisions. Negative results must be combined with clinical observations, patient history, and epidemiological information. The expected result is Negative.  Fact Sheet for Patients: HairSlick.nohttps://www.fda.gov/media/138098/download  Fact Sheet for  Healthcare Providers: quierodirigir.com  This test is not yet approved or cleared by the [place] and  has been authorized for detection and/or diagnosis of SARS-CoV-2 by FDA under an Emergency Use Authorization (EUA). This EUA will remain  in effect (meaning this test can be used) for the duration of the COVID-19 declaration under Se ction 564(b)(1) of the Act, 21 U.S.C. section 360bbb-3(b)(1), unless the authorization is terminated or revoked sooner.  Performed at [facility], [address] [place], [place] [address]          Radiology Studies: MR BRAIN WO CONTRAST  Result Date: 09/09/2020 CLINICAL DATA:  Neuro deficit, acute, stroke suspected; dizziness and weakness after chiropractor visit; abnormal CTA EXAM: MRI HEAD WITHOUT CONTRAST TECHNIQUE: Multiplanar, multiecho pulse sequences of the brain and surrounding structures were obtained without intravenous contrast. COMPARISON:  None. FINDINGS: Brain: Focal reduced diffusion at the left lateral aspect of the medulla. Ventricles and sulci are normal  in size and configuration. A few small foci of T2 hyperintensity in the cerebral white matter likely reflect nonspecific gliosis/demyelination of doubtful clinical significance. There is no evidence of intracranial hemorrhage. There is no intracranial mass, mass effect, or edema. There is no hydrocephalus or extra-axial fluid collection. Vascular: Major vessel flow voids at the skull base are preserved. Skull and upper cervical spine: Normal marrow signal is preserved. Sinuses/Orbits: Paranasal sinuses are aerated. Orbits are unremarkable. Other: Sella is unremarkable.  Mastoid air cells are clear. IMPRESSION: Small acute stroke of the lateral left medulla. These results were called by telephone at the time of interpretation on 09/09/2020 at 1:11 pm to provider [facility] , who verbally acknowledged these results. Electronically Signed   By: Guadlupe Spanish M.D.   On: 09/09/2020 13:12   MR Cervical Spine Wo Contrast  Result Date: 09/09/2020 CLINICAL DATA:  Cervical radiculopathy caudal right flecks; numbness face and neck after chiropractor adjustment EXAM: MRI CERVICAL SPINE WITHOUT CONTRAST TECHNIQUE: Multiplanar, multisequence MR imaging of the cervical spine was performed. No intravenous contrast was administered. COMPARISON:  None. FINDINGS: Alignment: No significant listhesis. Vertebrae: Vertebral body heights are maintained. No marrow edema. No suspicious osseous lesion Cord: Normal caliber and signal. Posterior Fossa, vertebral arteries, paraspinal tissues: Possible crescentic abnormal signal is present about the left V2 vertebral artery at the C3-C4 to C5 levels. May reflect subintimal hematoma of dissection seen on CTA. Otherwise unremarkable. Disc levels: Minimal central protrusion at C6-C7. There is no canal or foraminal stenosis at any level. IMPRESSION: No abnormal cord signal.  No canal or foraminal stenosis. Electronically Signed   By: Guadlupe Spanish M.D.   On: 09/09/2020 13:17   DG Chest  Port 1 View  Result Date: 09/11/2020 CLINICAL DATA:  Chest pain. EXAM: PORTABLE CHEST 1 VIEW COMPARISON:  None. FINDINGS: Cardiomegaly. No focal consolidation, pleural effusion, or pneumothorax. No acute osseous abnormality. IMPRESSION: 1. Cardiomegaly. No active disease. Electronically Signed   By: Obie Dredge M.D.   On: 09/11/2020 07:22        Scheduled Meds:  aspirin EC  81 mg Oral Daily   clopidogrel  75 mg Oral Daily   gabapentin  300 mg Oral TID   pantoprazole  40 mg Oral BID   Continuous Infusions:  lactated ringers 50 mL/hr at 09/11/20 0903   promethazine (PHENERGAN) injection (IM or IVPB) 25 mg (09/10/20 2009)     LOS: 1 day    Time spent: 35 minutes  Signature  Susa Raring M.D on 09/11/2020 at  10:47 AM   -  To page go to [email or web address]

## 2020-09-11 NOTE — Progress Notes (Signed)
`  Patient alert and oriented, refused bed alarm, father at bedside, patient and father educated about safety, both verbalized understanding, will continue to monitor.

## 2020-09-11 NOTE — Progress Notes (Signed)
Inpatient Rehabilitation Admissions Coordinator   Noted patient not yet at a level to pursue intensive therapies at Cir . I await further progress out of bed with transfers before beginning Auth for CIR. I will follow up in am.  Ottie Glazier, RN, MSN Rehab Admissions Coordinator [phone] 09/11/2020 3:14 PM

## 2020-09-12 ENCOUNTER — Inpatient Hospital Stay: Payer: BC Managed Care – PPO

## 2020-09-12 IMAGING — CT CT HEAD W/O CM
3 series · 15 of 47 positions shown, 18 images · non-contrast
Comparison: Head CT earlier today.  Brain MRI yesterday.

CLINICAL DATA: Neuro deficit, acute, stroke suspected

Recent left medulla infarct due to left vertebral artery dissection.
EXAM:
CT HEAD WITHOUT CONTRAST
TECHNIQUE: Contiguous axial images were obtained from the base of the skull
through the vertex without intravenous contrast.

[Series 2: head wo · axial · 0.43mm/px · z∈[+131,+261]mm · 9 of 32 slices shown, 12 images]
[im 3/32  brain]
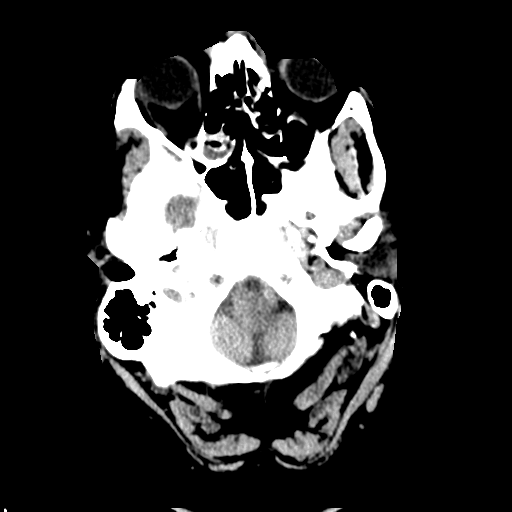
[im 3/32  bone]
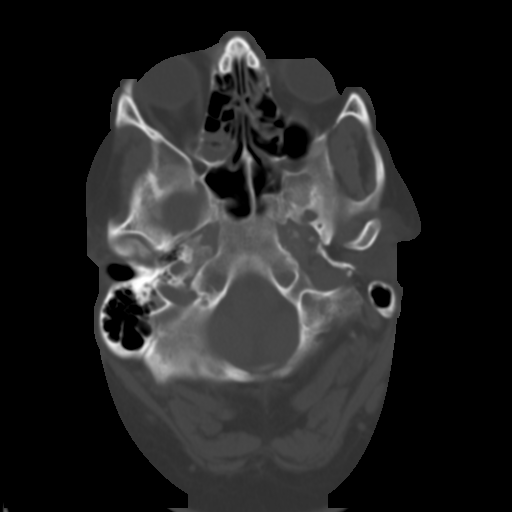
[im 6/32  brain]
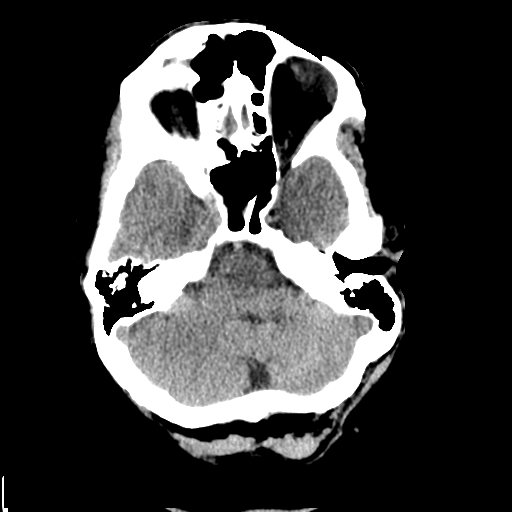
[im 9/32  brain]
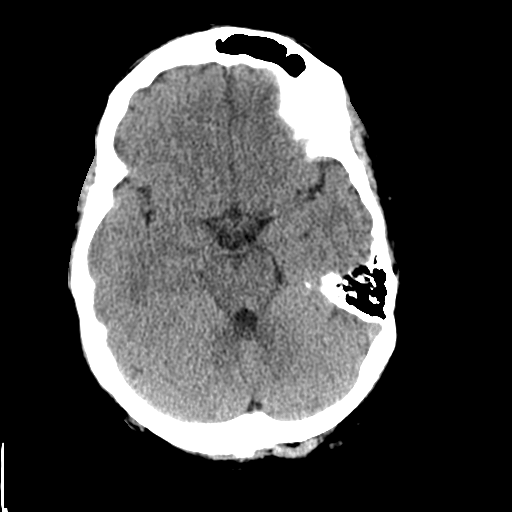
[im 12/32  brain]
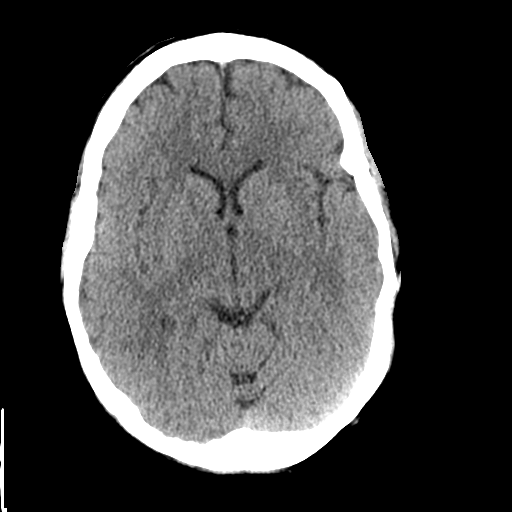
[im 17/32  brain]
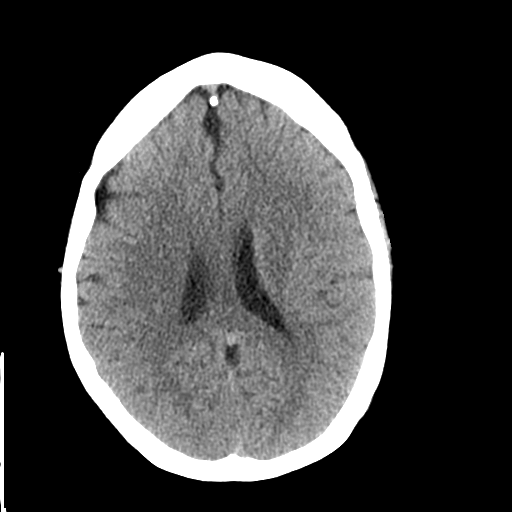
[im 17/32  bone]
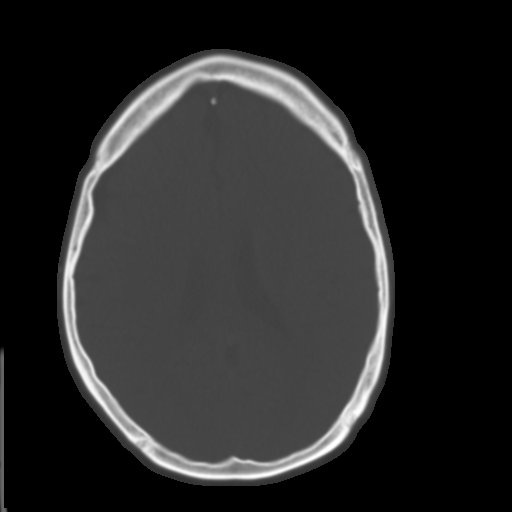
[im 20/32  brain]
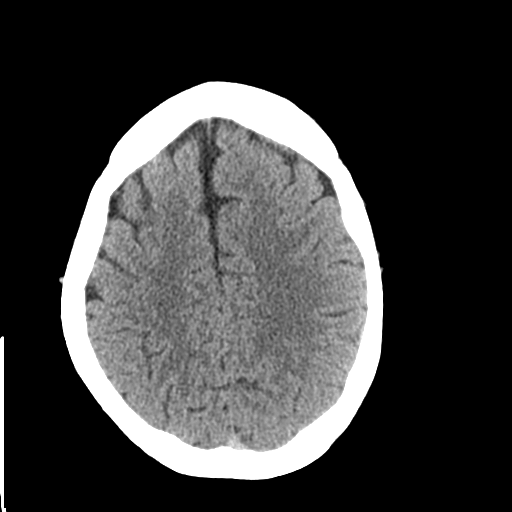
[im 23/32  brain]
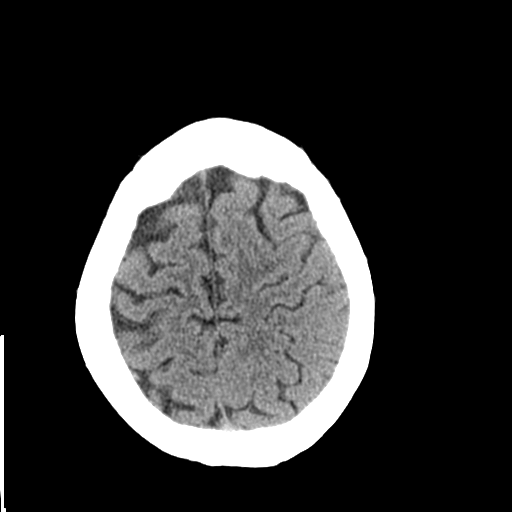
[im 26/32  brain]
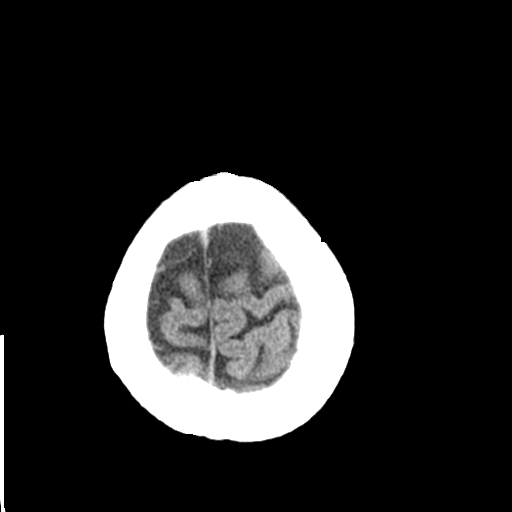
[im 29/32  brain]
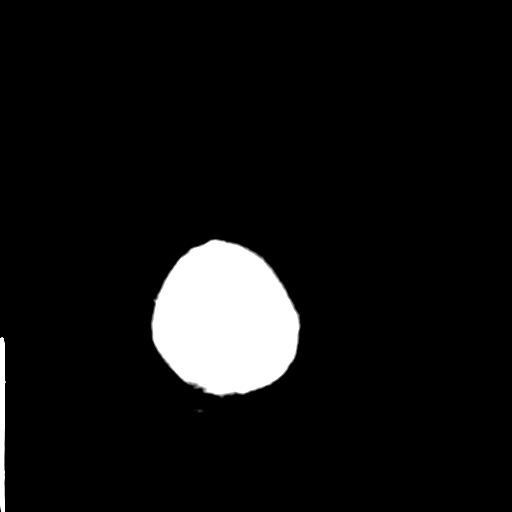
[im 29/32  bone]
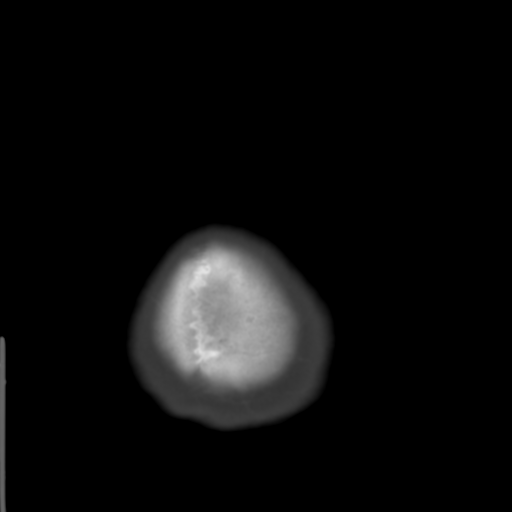

[Series 4: coronal soft tissue · coronal · 0.33mm/px · 3 of 67 slices shown]
[im 23/67  brain]
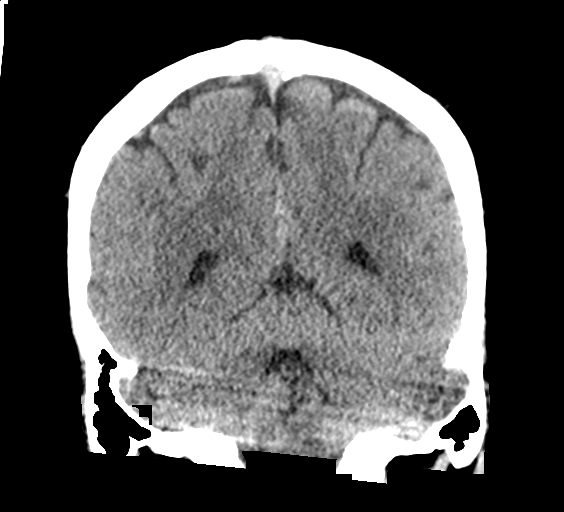
[im 30/67  brain]
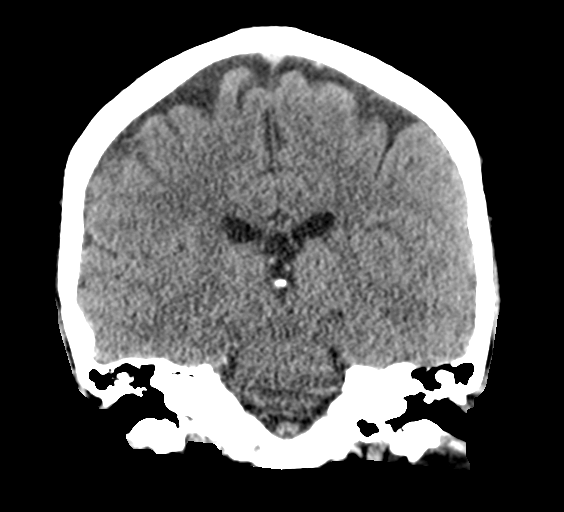
[im 37/67  brain]
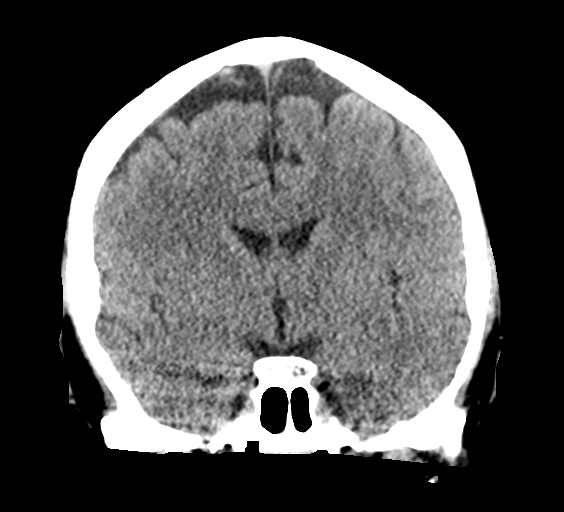

[Series 5: sagittal soft tissue · sagittal · 0.33mm/px · 3 of 52 slices shown]
[im 18/52  brain]
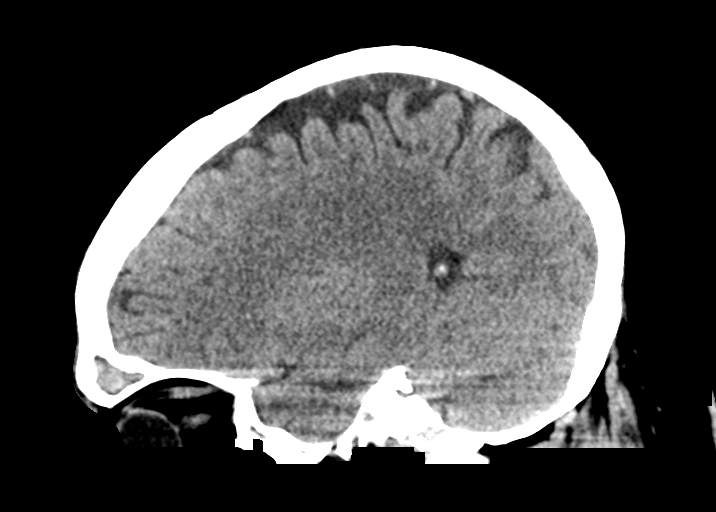
[im 26/52  brain]
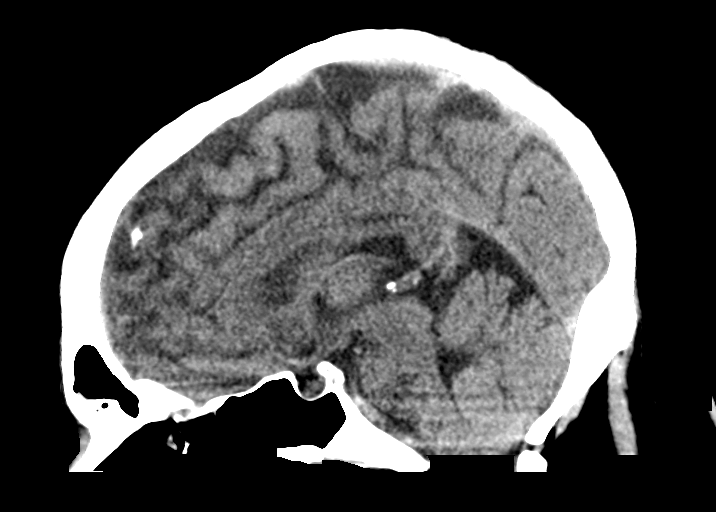
[im 35/52  brain]
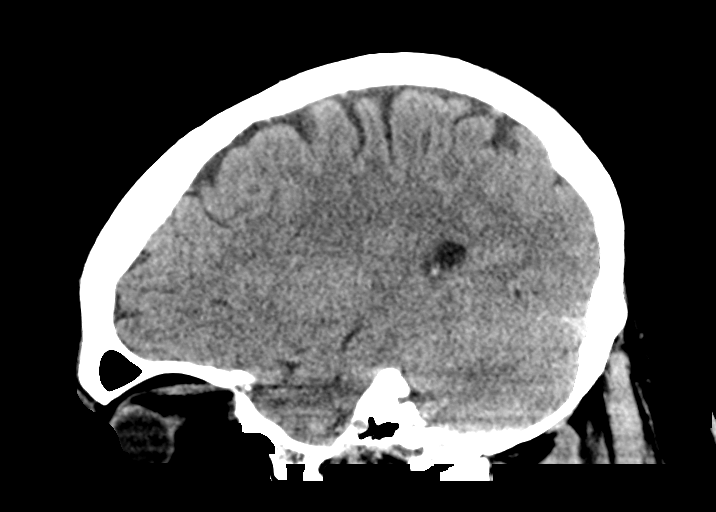

[15 of 47 positions shown; findings below may reference images not displayed]

FINDINGS: Brain: The known left medullary infarct is only faintly visualized
by CT. The petechial hemorrhage on MRI is not seen. No additional
area of acute ischemia. No hemorrhage or hydrocephalus. No midline
shift or mass effect. No extra-axial or subdural collection.

Vascular: No intracranial hyperdense vessel.

Skull: No fracture or focal lesion.

Sinuses/Orbits: Mucosal thickening throughout the ethmoid air cells
and right frontal sinus, stable from prior. No mastoid effusion.

Other: None.
IMPRESSION: 1. The known left medullary infarct is only faintly visualized. The
petechial hemorrhage on MRI is not seen by CT.
2. No new abnormality.

## 2020-09-12 IMAGING — CT CT HEAD W/O CM
3 series · 15 of 47 positions shown, 18 images · non-contrast
Comparison: Brain MRI [DATE]

CLINICAL DATA: Stroke follow-up

EXAM:
CT HEAD WITHOUT CONTRAST
TECHNIQUE: Contiguous axial images were obtained from the base of the skull
through the vertex without intravenous contrast.

[Series 3: head wo · axial · 0.46mm/px · z∈[-136,-6]mm · 9 of 32 slices shown, 12 images]
[im 3/32  brain]
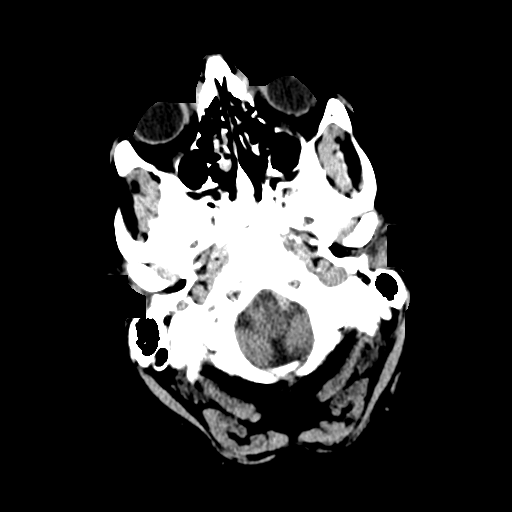
[im 3/32  bone]
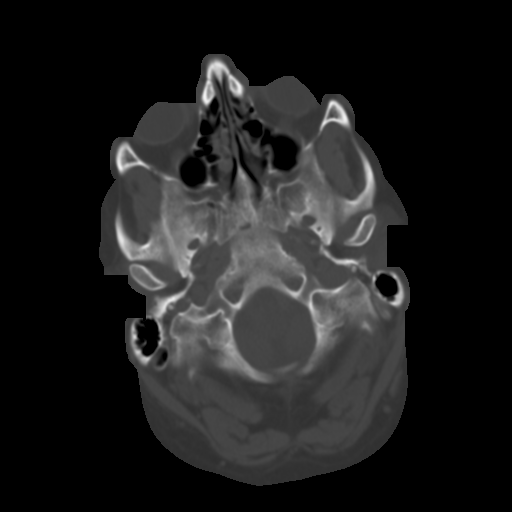
[im 6/32  brain]
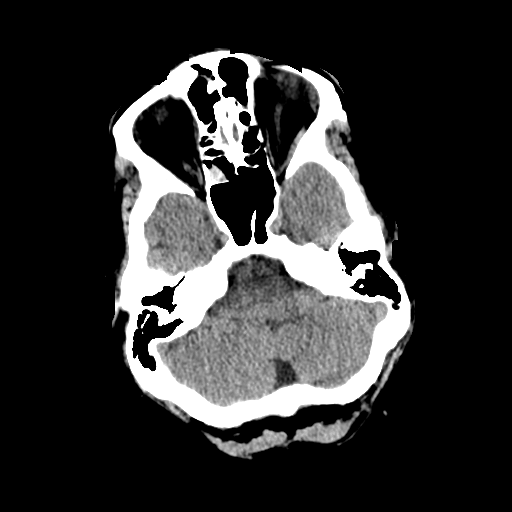
[im 9/32  brain]
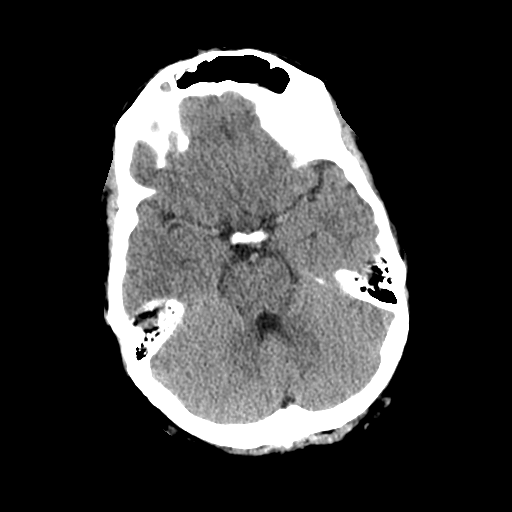
[im 12/32  brain]
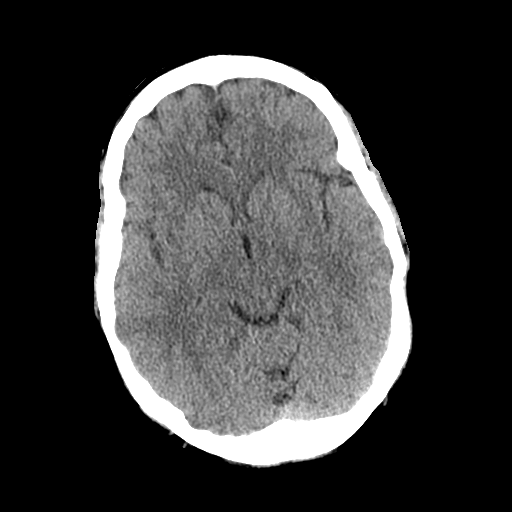
[im 17/32  brain]
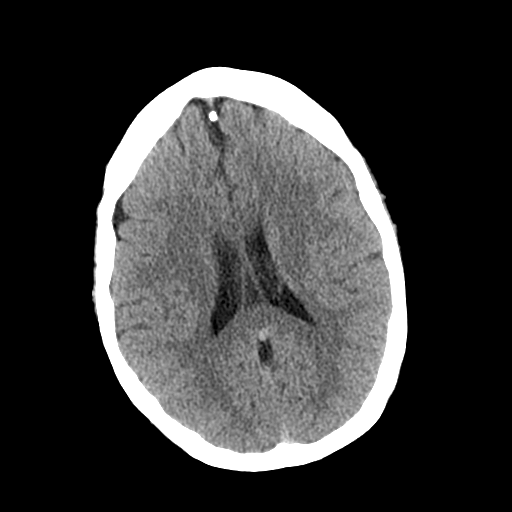
[im 17/32  bone]
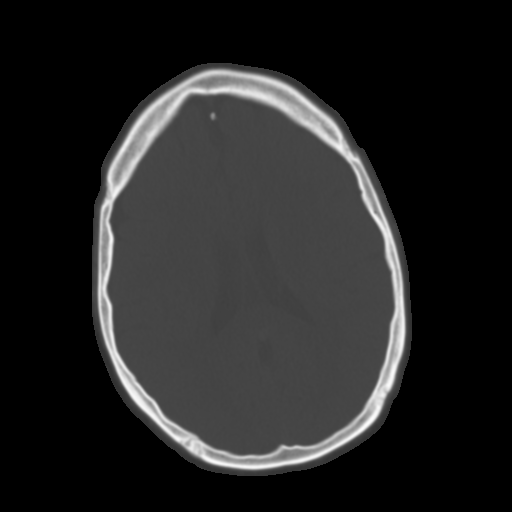
[im 20/32  brain]
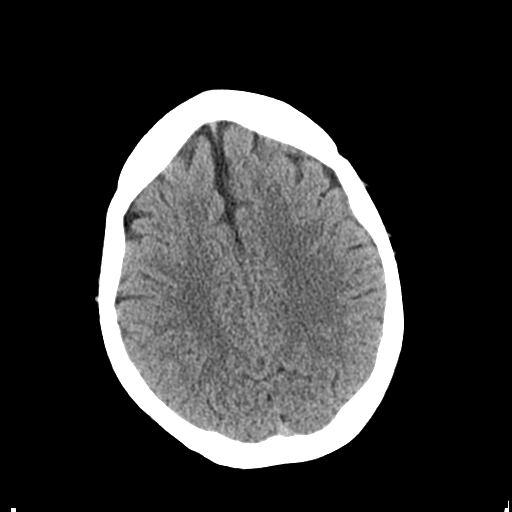
[im 23/32  brain]
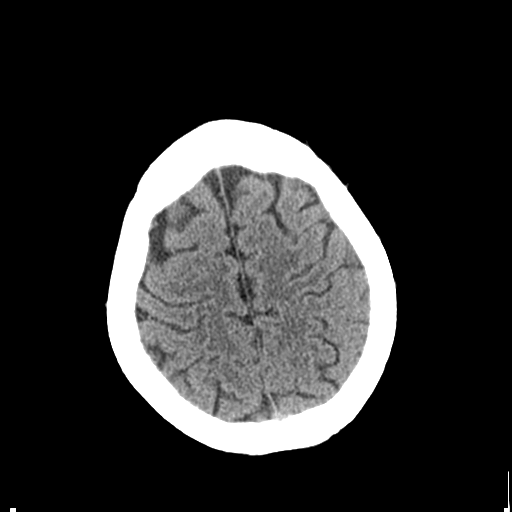
[im 26/32  brain]
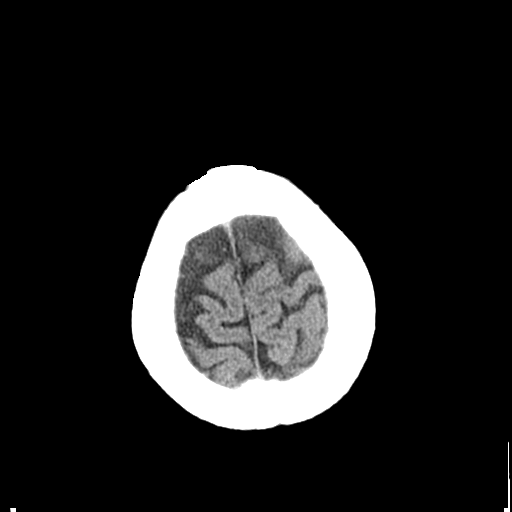
[im 29/32  brain]
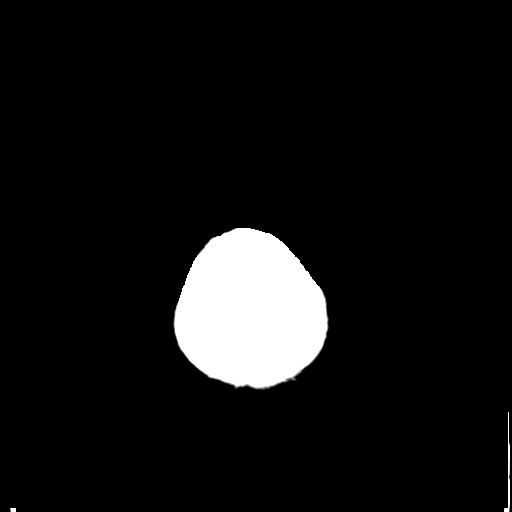
[im 29/32  bone]
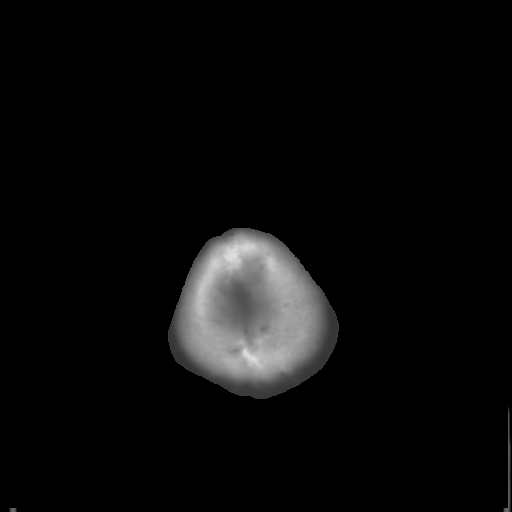

[Series 4: coronal soft tissue · coronal · 0.31mm/px · 3 of 70 slices shown]
[im 24/70  brain]
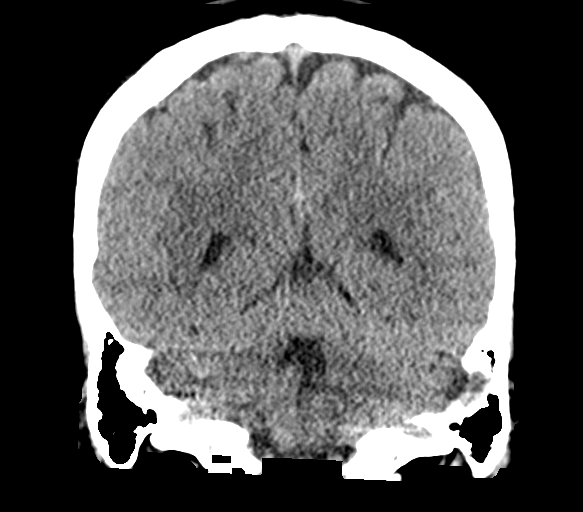
[im 31/70  brain]
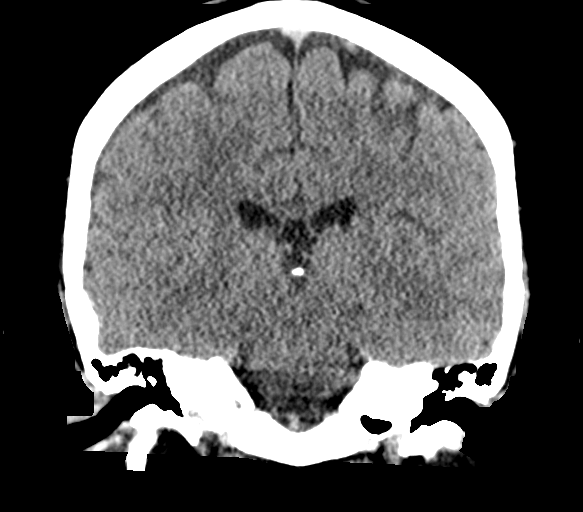
[im 39/70  brain]
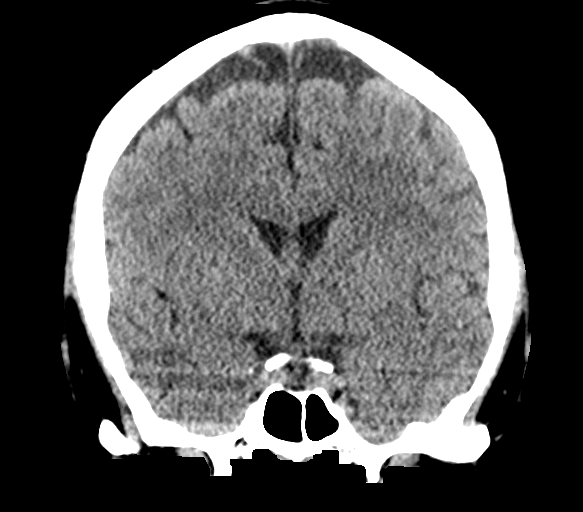

[Series 5: sagittal soft tissue · sagittal · 0.32mm/px · 3 of 61 slices shown]
[im 21/61  brain]
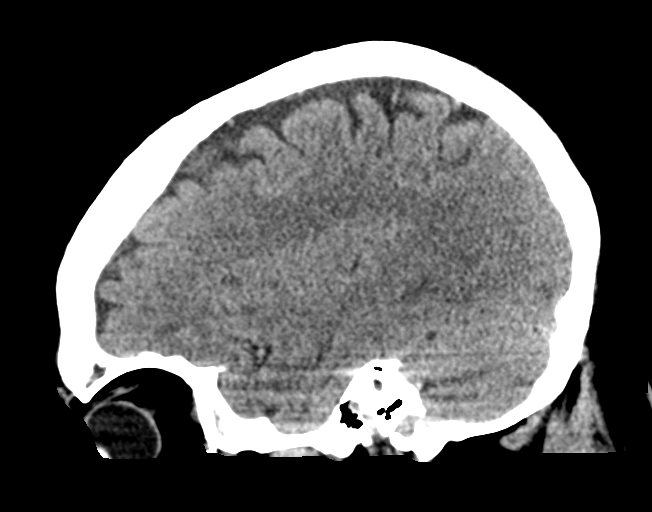
[im 31/61  brain]
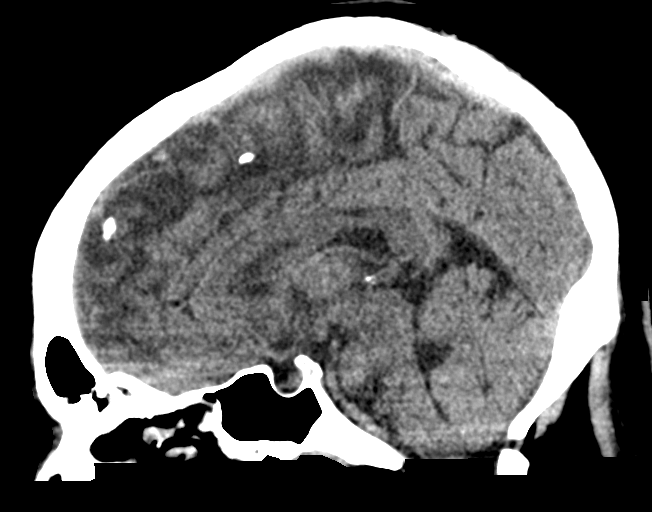
[im 41/61  brain]
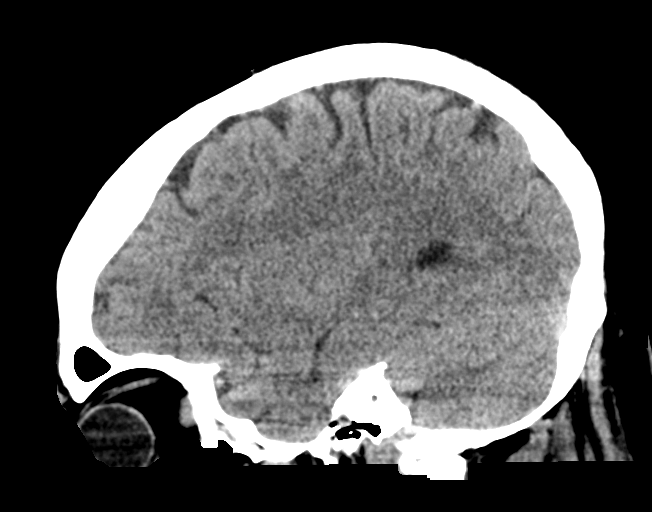

[15 of 47 positions shown; findings below may reference images not displayed]

FINDINGS: Brain: There is no mass, hemorrhage or extra-axial collection. The
size and configuration of the ventricles and extra-axial CSF spaces
are normal. Faint hypoattenuation at the left medulla oblongata.

Vascular: No abnormal hyperdensity of the major intracranial
arteries or dural venous sinuses. No intracranial atherosclerosis.

Skull: The visualized skull base, calvarium and extracranial soft
tissues are normal.

Sinuses/Orbits: No fluid levels or advanced mucosal thickening of
the visualized paranasal sinuses. No mastoid or middle ear effusion.
The orbits are normal.
IMPRESSION: 1. No acute intracranial hemorrhage.
2. Faint hypoattenuation at the left medulla oblongata, compatible
with recent infarct.

## 2020-09-12 MED ORDER — ACETAMINOPHEN-CODEINE #3 300-30 MG PO TABS
1.0000 | ORAL_TABLET | Freq: Four times a day (QID) | ORAL | Status: DC | PRN
Start: 2020-09-12 — End: 2020-09-13
  Administered 2020-09-12 – 2020-09-13 (×4): 1 via ORAL
  Filled 2020-09-12 (×7): qty 1

## 2020-09-12 MED ORDER — MECLIZINE HCL 25 MG PO TABS
50.0000 mg | ORAL_TABLET | Freq: Three times a day (TID) | ORAL | Status: DC
  Administered 2020-09-12 – 2020-09-13 (×4): 50 mg via ORAL
  Filled 2020-09-12 (×6): qty 2

## 2020-09-12 MED ORDER — PHENOL 1.4 % MT LIQD
1.0000 | OROMUCOSAL | Status: DC | PRN
  Administered 2020-09-12: 10:00:00 1 via OROMUCOSAL
  Filled 2020-09-12: qty 177

## 2020-09-12 NOTE — H&P (Signed)
Physical Medicine and Rehabilitation Admission H&P    Chief Complaint  Patient presents with   Weakness  : HPI: Curtis [name] is a 35 year old right-handed male with history of obesity with BMI 36.62.  Per chart review patient lives with spouse including 85-month-old daughter.  1 level home 3 steps to entry.  Independent prior to admission.  Patient works full-time from home.  Presented to [facility] 09/09/2020 with left-sided numbness, headache dizziness as well as nausea x1 week.  Per report recently presented to chiropractor 2 weeks ago for regular back adjustments.  He denied having any symptoms at that time however he has had some persistent on and off neck pain x3 weeks.  MRI of the brain showed small acute stroke of the lateral left medulla.  CTA head and neck irregularity of the V2 segment of the left vertebral artery with sites of up to mild/moderate stenosis.  Irregularity of the V4 left vertebral artery with sites of up to moderate/severe stenosis.  MRI of the brain follow-up more conspicuous signal abnormality in the left medulla that could possibly be evolutionary change of previous stroke.  CT angiogram head and neck repeated 09/11/2020 showing persistent findings of left vertebral artery dissection extracranially and intracranially.  Slightly increased narrowing at the level of the C2 transverse foramen.  Neurology follow-up currently maintained on aspirin and Plavix for CVA prophylaxis x3 months.  Patient would require repeat vascular imaging in 3 months to determine need to continue DAPT.  Tolerating a regular consistency diet.  Therapy evaluations completed due to patient decreased functional mobility was admitted for a comprehensive rehab program.  Review of Systems  Constitutional:  Negative for chills and fever.  HENT:  Negative for hearing loss.   Eyes:  Negative for blurred vision.  Respiratory:  Negative for cough and shortness of breath.   Cardiovascular:  Negative for chest  pain, palpitations and leg swelling.  Gastrointestinal:  Positive for constipation and nausea. Negative for heartburn.  Genitourinary:  Negative for dysuria, flank pain and hematuria.  Musculoskeletal:  Positive for back pain and neck pain.  Skin:  Negative for rash.  Neurological:  Positive for dizziness and headaches.  All other systems reviewed and are negative. History reviewed. No pertinent past medical history. History reviewed. No pertinent surgical history. History reviewed. No pertinent family history. Social History:  reports that he has been smoking cigars. He has never used smokeless tobacco. He reports current alcohol use of about 2.0 standard drinks per week. He reports that he does not use drugs. Allergies: No Known Allergies Medications Prior to Admission  Medication Sig Dispense Refill   cyclobenzaprine (FLEXERIL) 5 MG tablet Take 5 mg by mouth 3 (three) times daily as needed.     [EXPIRED] methylPREDNISolone (MEDROL DOSEPAK) 4 MG TBPK tablet Take 1-6 tablets by mouth daily. Take 6 tablets on day 1 as directed on package and decrease by 1 tab each day for a total of 6 days      Drug Regimen Review Drug regimen was reviewed and remains appropriate with no significant issues identified  Home: Home Living Family/patient expects to be discharged to:: Private residence Living Arrangements: Spouse/significant other, Children Available Help at Discharge: Family, Available 24 hours/day Type of Home: House Home Access: Stairs to enter [employer] of Steps: 3 Entrance Stairs-Rails: Left Home Layout: One level Bathroom Shower/Tub: Health visitor: Standard Bathroom Accessibility: Yes Home Equipment: Information systems manager - built in  Lives With: Spouse, Daughter   Functional History:  Prior Function Level of Independence: Independent Comments: Pt works from home full time, has 64mo daughter. Drives, plays golf  Functional Status:  Mobility: Bed  Mobility Overal bed mobility: Modified Independent General bed mobility comments: pt able to perform several log rolls prior to mobility transition. Approx 3/10 dizziness noted with transition to EOB. Once seated assisted in adjusting to upright midline posture as he tends to still present with L lateral lean. Transfers Overall transfer level: Needs assistance Equipment used: Rolling walker (2 wheeled) Transfers: Sit to/from Stand Sit to Stand: Min assist Stand pivot transfers: Min assist General transfer comment: able to stand with cues for hand placement. Once standing assisted with midline posture and safe BOS. Static standing balance improved Ambulation/Gait Ambulation/Gait assistance: Min assist, Mod assist, +2 safety/equipment Gait Distance (Feet): 75 Feet Assistive device: Rolling walker (2 wheeled) Gait Pattern/deviations: Step-to pattern General Gait Details: Effortful gait requiring RW and L lateral lean and several LOB requiring occasional mod assist for correction. With slow gait, balance improves, however with any challenge in speed, LOB noted with decreased ability to correct. Fatigues with increased distance with 1 standing rest break required    ADL: ADL Overall ADL's : Needs assistance/impaired General ADL Comments: Pt completed ADL session wearing eye patch on R eye. MIN A + RW hand washing standing sinkside - pt tolerates ~1 min standing grooming prior to requesting to sit r/t dizziness. Pt toelrated x3 standing trials c MIN A + RW - assist for L lateral lean in standing. Initial MIN A + RW for side steps at EOB decreasing to MOD A + RW for forward/backward steps at EOB.  Cognition: Cognition Overall Cognitive Status: Within Functional Limits for tasks assessed Arousal/Alertness: Awake/alert Orientation Level: Oriented X4 Attention: Focused, Sustained Focused Attention: Appears intact Sustained Attention: Appears intact Memory: Appears intact Awareness: Appears  intact Behaviors:  (n/a) Safety/Judgment: Appears intact Cognition Arousal/Alertness: Awake/alert Behavior During Therapy: WFL for tasks assessed/performed Overall Cognitive Status: Within Functional Limits for tasks assessed General Comments: eyes closed during session due to dizziness/nausea. Rates dizziness at 8/10 increasing to 10/10 with any head movement  Physical Exam: Blood pressure 140/79, pulse 72, temperature 98.3 F (36.8 C), temperature source Oral, resp. rate 18, height 6' (1.829 m), weight 122.5 kg, SpO2 100 %. Physical Exam Constitutional:      General: He is not in acute distress.    Appearance: He is obese. He is not ill-appearing.  HENT:     Right Ear: External ear normal.     Left Ear: External ear normal.     Nose: Nose normal.     Mouth/Throat:     Mouth: Mucous membranes are moist.     Pharynx: Oropharynx is clear.  Eyes:     Conjunctiva/sclera: Conjunctivae normal.     Pupils: Pupils are equal, round, and reactive to light.  Cardiovascular:     Rate and Rhythm: Normal rate and regular rhythm.     Heart sounds: No murmur heard.   No gallop.  Pulmonary:     Effort: Pulmonary effort is normal. No respiratory distress.     Breath sounds: No wheezing or rales.  Abdominal:     General: Bowel sounds are normal. There is no distension.     Tenderness: There is no abdominal tenderness.  Musculoskeletal:        General: No swelling or tenderness. Normal range of motion.     Cervical back: Normal range of motion.  Skin:    General: Skin is warm  and dry.  Neurological:     Comments: Patient is alert.  Wearing eye patch.  Oriented x3 and follows commands. Normal insight and awareness. Normal language and speech. Functional memory. CN notable for diplopia with tracking to all fields, 2-3 beats of nystagmus with left lateral gaze. Does past point L>R without eye patch. Eyes move easily in all directions however. Mild perioral sensory loss as well as decreased LT LUE  and decreased LT/PP RUE. No focal sensory deficits in legs. Motor essentially 5/5 in UE's and LE's. +PD on left, mild ataxia L>R. DTR's 1+  Psychiatric:        Mood and Affect: Mood normal.        Behavior: Behavior normal.        Thought Content: Thought content normal.        Judgment: Judgment normal.    No results found for this or any previous visit (from the past 48 hour(s)).  CT ANGIO HEAD NECK W WO CM  Result Date: 09/11/2020 CLINICAL DATA:  Left vertebral dissection and left brainstem stroke with worsening headache, left arm weakness EXAM: CT ANGIOGRAPHY HEAD AND NECK TECHNIQUE: Multidetector CT imaging of the head and neck was performed using the standard protocol during bolus administration of intravenous contrast. Multiplanar CT image reconstructions and MIPs were obtained to evaluate the vascular anatomy. Carotid stenosis measurements (when applicable) are obtained utilizing NASCET criteria, using the distal internal carotid diameter as the denominator. CONTRAST:  60mL OMNIPAQUE IOHEXOL 350 MG/ML SOLN COMPARISON:  CTA and MRI 09/09/2020 FINDINGS: CT HEAD Brain: There is no acute intracranial hemorrhage, mass effect, or edema. Gray-white differentiation is preserved. Small infarct of the lateral left medulla is better seen on the MRI. There is no extra-axial fluid collection. Ventricles and sulci are within normal limits in size and configuration. Vascular: No new findings. Skull: Calvarium is unremarkable. Sinuses/Orbits: No acute finding. Other: None. Review of the MIP images confirms the above findings CTA NECK Aortic arch: Patent great vessel origins. Aberrant origin of the right subclavian artery. Right carotid system: Patent. No stenosis or evidence of dissection. Left carotid system: Patent.  No stenosis or evidence of dissection. Vertebral arteries: Patent and codominant. As before, there is irregularity and narrowing of the left V2 vertebral artery, for example from C3 to C5. There  is an additional area narrowing within the left C2 transverse foramen (series 11, image 197) and this appears slightly increased. Stenosis is moderate. Skeleton: No new finding. Other neck: No new finding. Upper chest: No new finding. Review of the MIP images confirms the above findings CTA HEAD Anterior circulation: Intracranial internal carotid arteries are patent. Anterior and middle cerebral arteries are patent. Posterior circulation: Intracranial right vertebral artery is patent. Intracranial left vertebral artery is patent with persistent multifocal narrowing, including focal severe stenosis. Appearance is similar to the prior study. Basilar artery is patent. Bilateral AICA and SCA origins are patent. A PICA is not identified on either side. Posterior cerebral arteries are patent. Venous sinuses: Patent as allowed by contrast bolus timing. Review of the MIP images confirms the above findings IMPRESSION: No acute intracranial abnormality. Small infarct of medulla is better seen on prior MRI. Persistent findings of left vertebral artery dissection extracranially and intracranially. Slightly increased narrowing at the level of the C2 transverse foramen. Remains up to severe stenosis intracranially. Electronically Signed   By: Guadlupe Spanish M.D.   On: 09/11/2020 15:03   CT HEAD WO CONTRAST ( )  Result Date: 09/13/2020 CLINICAL DATA:  Headache and right upper extremity numbness. EXAM: CT HEAD WITHOUT CONTRAST TECHNIQUE: Contiguous axial images were obtained from the base of the skull through the vertex without intravenous contrast. COMPARISON:  CT head from yesterday. FINDINGS: Brain: Unchanged subtle hypodensity in the left medulla consistent with known infarct. No hemorrhage by CT. No hydrocephalus, extra-axial collection or mass lesion/mass effect. Vascular: No hyperdense vessel or unexpected calcification. Skull: Normal. Negative for fracture or focal lesion. Sinuses/Orbits: Unchanged mucosal thickening  of the ethmoid air cells and right frontal sinus. Other: None. IMPRESSION: 1. Unchanged subtle hypodensity in the left medulla consistent with known infarct. No hemorrhage by CT. Electronically Signed   By: Obie Dredge M.D.   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 EXAM: CT HEAD WITHOUT CONTRAST TECHNIQUE: Contiguous axial images were obtained from the base of the skull through the vertex without intravenous contrast. COMPARISON:  Head CT earlier today.  Brain MRI yesterday. FINDINGS: Brain: The known left medullary infarct is only faintly visualized by CT. The petechial hemorrhage on MRI is not seen. No additional area of acute ischemia. No hemorrhage or hydrocephalus. No midline shift or mass effect. No extra-axial or subdural collection. Vascular: No intracranial hyperdense vessel. Skull: No fracture or focal lesion. Sinuses/Orbits: Mucosal thickening throughout the ethmoid air cells and right frontal sinus, stable from prior. No mastoid effusion. Other: None. IMPRESSION: 1. The known left medullary infarct is only faintly visualized. The petechial hemorrhage on MRI is not seen by CT. 2. No new abnormality. Electronically Signed   By: Narda Rutherford M.D.   On: 09/12/2020 21:52   CT HEAD WO CONTRAST ( )  Result Date: 09/12/2020 CLINICAL DATA:  Stroke follow-up EXAM: CT HEAD WITHOUT CONTRAST TECHNIQUE: Contiguous axial images were obtained from the base of the skull through the vertex without intravenous contrast. COMPARISON:  Brain MRI 09/11/2020 FINDINGS: Brain: There is no mass, hemorrhage or extra-axial collection. The size and configuration of the ventricles and extra-axial CSF spaces are normal. Faint hypoattenuation at the left medulla oblongata. Vascular: No abnormal hyperdensity of the major intracranial arteries or dural venous  sinuses. No intracranial atherosclerosis. Skull: The visualized skull base, calvarium and extracranial soft tissues are normal. Sinuses/Orbits: No fluid levels or advanced mucosal thickening of the visualized paranasal sinuses. No mastoid or middle ear effusion. The orbits are normal. IMPRESSION: 1. No acute intracranial hemorrhage. 2. Faint hypoattenuation at the left medulla oblongata, compatible with recent infarct. Electronically Signed   By: Deatra Robinson M.D.   On: 09/12/2020 02:19   MR BRAIN WO CONTRAST  Result Date: 09/11/2020 CLINICAL DATA:  Neuro deficit, acute, stroke suspected. Left vertebral artery dissection with left lateral medullary stroke and worsening headache. EXAM: MRI HEAD WITHOUT CONTRAST TECHNIQUE: Multiplanar, multiecho pulse sequences of the brain and surrounding structures were obtained without intravenous contrast. COMPARISON:  CT angiography same day.  MRI 09/09/2020. FINDINGS: Brain: Acute infarction of the left medulla appears more extensive compared to the study of 2 days ago. This may simply represent evolutionary change of the infarction or could represent actual extension. Patient now has clear T2 and FLAIR signal abnormality in the region of the infarction. Mild petechial blood products are present based on the SW[facility] imaging. No frank hematoma. Otherwise, the brain appears normal. No cerebellar or occipital lobe stroke. Cerebral hemispheres appear entirely normal. No hydrocephalus or extra-axial collection. Vascular: Major vessels  at the base of the brain show flow. Skull and upper cervical spine: Negative Sinuses/Orbits: Continued sinus inflammatory changes, particularly of the right frontal sinus. Other: None IMPRESSION: More conspicuous signal abnormality in the left medulla that could possibly be evolutionary change of the previous stroke, though some extension is not excluded. Petechial blood products in the region without measurable hematoma. Electronically Signed   By:  Paulina FusiMark  Shogry M.D.   On: 09/11/2020 18:52       Medical Problem List and Plan: 1.  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  Antithrombotics: -DVT/anticoagulation: SCDs    -antiplatelet therapy: Aspirin 81 mg daily and Plavix 75 mg daily x3 months then repeat vascular imaging to determine need to continue DAPT 3. Pain Management: Neurontin 300 mg 3 times daily, Tylenol 3 as needed  -tylenol has generally been effective for intermittent headaches 4. Mood: Provide emotional support  -antipsychotic agents: N/A 5. Neuropsych: This patient is capable of making decisions on his own behalf. 6. Skin/Wound Care: Routine skin checks 7. Fluids/Electrolytes/Nutrition: Routine in and outs with follow-up chemistries on admit 8.  Obesity.  BMI 36.62.  Dietary changes shall be discussed      Charlton AmorDaniel J [name], PA-C 09/13/2020

## 2020-09-12 NOTE — Progress Notes (Signed)
Occupational Therapy Treatment Patient Details Name: Curtis [name] MRN: [id number] DOB: 1985/11/24 Today's Date: 09/12/2020    History of present illness Pt is a 35 y.o. male with medical history significant for obesity, presents emergency department for chief concerns of left-sided numbness and headache on the left side.  He reports that the left-sided numbness, left-sided headache, dizziness, and nausea started after taking 1 dose of muscle relaxer at approximately 5 AM on day of presentation.   He further endorses occasional double vision, blurry vision that is beyond his baseline vision changes that requires corrective glasses. He endorses imbalance and dizziness which is severe per pt report. 09/10/20 MRI: Small acute stroke of the lateral left medulla.  MR of Cervical Spine: Possible crescentic abnormal signal is present about the left V2 vertebral  artery at the C3-C4 to C5 levels   OT comments  Pt seen for OT treatment on this date. Upon arrival to room pt  Pt completed ADL session wearing eye patch on R eye (placed at start of session as pt reports approximately 30 min of improved dizziness upon switching). Pt tolerated x3 standing trials c MIN A + RW - pt does not require physical assist to achieve upright posture however MIN A to maintain midline in standing. MIN A + RW hand washing standing sinkside - pt tolerates ~1 min standing grooming prior to requesting to sit r/t dizziness.   Initial MIN A + RW for side steps at EOB (tolerating 5L + 5R side steps x2 trials)  decreasing to MOD A + RW for forward/backward steps at EOB (3 forward + 3 backward steps x2 trials). Pt instructed on sitting EOB in midline t/o day - wife at bedside supportive and agreeable to assist with feedback to endure pt seated in midline.   Pt and family educated on HEP - green theraputty provided and exercises reviewed with good return demonstration. Pt making good progress toward goals. Pt continues to benefit from  skilled OT services to maximize return to PLOF and minimize risk of future falls, injury, caregiver burden, and readmission. Will continue to follow POC. Discharge recommendation remains appropriate.    Follow Up Recommendations  CIR    Equipment Recommendations  Other (comment) (TBD at next venue of care)    Recommendations for Other Services Rehab consult    Precautions / Restrictions Precautions Precautions: Fall Restrictions Weight Bearing Restrictions: No       Mobility Bed Mobility Overal bed mobility: Modified Independent             General bed mobility comments: no cues for log roll    Transfers Overall transfer level: Needs assistance Equipment used: Rolling walker (2 wheeled) Transfers: Sit to/from Stand Sit to Stand: Min assist         General transfer comment: pt does not require physical assist to achieve upright posture however MIN A to maintain midline in standing    Balance Overall balance assessment: Needs assistance Sitting-balance support: No upper extremity supported;Feet supported Sitting balance-Leahy Scale: Fair Sitting balance - Comments: LUE support for static sitting to maintain midline Postural control: Left lateral lean Standing balance support: Bilateral upper extremity supported Standing balance-Leahy Scale: Fair                             ADL either performed or assessed with clinical judgement   ADL Overall ADL's : Needs assistance/impaired  General ADL Comments: Pt completed ADL session wearing eye patch on R eye. MIN A + RW hand washing standing sinkside - pt tolerates ~1 min standing grooming prior to requesting to sit r/t dizziness. Pt toelrated x3 standing trials c MIN A + RW - assist for L lateral lean in standing. Initial MIN A + RW for side steps at EOB decreasing to MOD A + RW for forward/backward steps at EOB.      Cognition Arousal/Alertness:  Awake/alert Behavior During Therapy: WFL for tasks assessed/performed Overall Cognitive Status: Within Functional Limits for tasks assessed                                          Exercises Exercises: Other exercises Other Exercises Other Exercises: Pt and family edcuated re: DME recs, d/c recs, stroke education, falls prevention, HEP- theraputty provided and excercises demonstrated, theraband exercises reviewed. Other Exercises: grooming, sup<>sit, sit<>stand x4, sitting/standing balance/tolerance, functinoal reach, 5L + 5R side steps x2 trials, 50forward/3backward steps x2 trials       Pertinent Vitals/ Pain       Pain Assessment: No/denies pain   Frequency  Min 3X/week        Progress Toward Goals  OT Goals(current goals can now be found in the care plan section)  Progress towards OT goals: Progressing toward goals  Acute Rehab OT Goals Patient Stated Goal: to return to PLOF OT Goal Formulation: With patient/family Time For Goal Achievement: 09/24/20 Potential to Achieve Goals: Good ADL Goals Pt Will Perform Grooming: with modified independence;standing Pt Will Perform Lower Body Dressing: sit to/from stand;Independently Pt Will Transfer to Toilet: with modified independence;ambulating;bedside commode Additional ADL Goal #1: Pt will Independently verbalize plan to implement x3 falls prevnetion strategies  Plan Discharge plan remains appropriate;Frequency remains appropriate       AM-PAC OT "6 Clicks" Daily Activity     Outcome Measure   Help from another person eating meals?: None Help from another person taking care of personal grooming?: A Little Help from another person toileting, which includes using toliet, bedpan, or urinal?: A Lot Help from another person bathing (including washing, rinsing, drying)?: A Lot Help from another person to put on and taking off regular upper body clothing?: A Little Help from another person to put on and taking  off regular lower body clothing?: A Little 6 Click Score: 17    End of Session Equipment Utilized During Treatment: Rolling walker  OT Visit Diagnosis: Other abnormalities of gait and mobility (R26.89)   Activity Tolerance Patient tolerated treatment well   Patient Left in bed;with call bell/phone within reach;with family/visitor present   Nurse Communication          Time: 2703-5009 OT Time Calculation (min): 23 min  Charges: OT General Charges $OT Visit: 1 Visit OT Treatments $Self Care/Home Management : 23-37 mins  Kathie Dike, M.S. OTR/L  09/12/20, 2:24 PM  ascom [phone]

## 2020-09-12 NOTE — Progress Notes (Addendum)
Pt got back from CT.  Will continue to monitor.  Update 0230: CT resulted MD Para March made aware no new order was place. Will continue to monitor.

## 2020-09-12 NOTE — Progress Notes (Signed)
[facility] PROGRESS NOTE    Curtis [name]  EHM:[id number] DOB: February 27, 1985 DOA: 09/09/2020 PCP: Pcp, No      Brief Narrative:  [name] is a 35 y.o. M with obesity no other significant PMHx presented with acute onset nausea, dizziness.  Patient had recently seen a chiropractor who is being treated with cervical manipulation.  On the morning of admission he developed sudden dizziness, nausea, neck pain, and headache and so he came to the ER.  CT angiogram was obtained that showed findings consistent with dissection of the left vertebral artery.  MRI brain showed small ischemic infarct in the left lateral medulla.  Subjective:  In bed still having left-sided occipital and temporal headache, minimal left arm weakness and discoordination and double vision when he opens both eyes.  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  Continue supportive care with dual antiplatelet therapy for 3 months thereafter Plavix, continue statin indefinitely, echo not needed per neurology.  Full stroke pathway followed.  Continue supportive care for headache, may require CIR.    Headache, Vertigo & Diplopia  due to above  - These are severe and debilitating at present.  Supportive care with Ativan, meclizine and Phenergan.  Continue PT OT.  May require CIR.  Neurontin for headaches.  Case discussed with neurologist Dr. Selina Cooley on 09/11/2020.  Headache medications adjusted further.    Obesity - BMI 36 with PCP for weight loss.  GERD - Continue PPI   Leukocytosis - Likely reactive, no suspected infection, trend improving.     Disposition: Status is: Inpatient  Remains inpatient appropriate because: he has severe and debilitating vertigo and  diplopia, not clearly taking fluids  Dispo: The patient is from: Home              Anticipated d/c is to: CIR              Patient currently is not medically stable to d/c.   Difficult to place patient No  MDM: The below labs and imaging reports were reviewed and summarized above.  Medication management as above.     DVT prophylaxis: SCD's Start: 09/09/20 1213  Code Status: FULL Family Communication: Wife, father    Consultants:  Neurology  Procedures:  8/15 CT angiogram of the head and neck --left vertebral artery dissection 8/15 MRI brain and cervical spine --left medullary stroke  Antimicrobials:     Culture data:      Objective: Vitals:   09/12/20 0000 09/12/20 0210 09/12/20 0336 09/12/20 0819  BP: 115/75 110/72 110/65 111/69  Pulse: 95 (!) 104 90 71  Resp: 20 20 18 18   Temp: 97.7 F (36.5 C) 97.6 F (36.4 C) 98 F (36.7 C) 97.6 F (36.4 C)  TempSrc: Oral Oral    SpO2: 96% 98% 95% 96%  Weight:      Height:        Intake/Output Summary (Last 24 hours) at 09/12/2020 1029 Last data filed at 09/11/2020 2206 Gross per 24 hour  Intake 60 ml  Output --  Net 60 ml   Filed Weights   09/09/20 0631 09/09/20 0800  Weight: 122.5 kg 122.5 kg    Examination:  Awake Alert, No new F.N deficits, possible minimal left-sided weakness, still has mild diplopia, nystagmus  improved, [place].AT,PERRAL Supple Neck,No JVD, No cervical lymphadenopathy appriciated.  Symmetrical Chest wall movement, Good air movement bilaterally, CTAB RRR,No Gallops, Rubs or new Murmurs, No Parasternal Heave +ve B.Sounds, Abd Soft, No tenderness, No organomegaly appriciated, No rebound - guarding or rigidity. No Cyanosis, Clubbing or edema, No new Rash or bruise      Data Reviewed: I have personally reviewed following labs and imaging studies:  CBC: Recent Labs  Lab 09/09/20 0758 09/10/20 0551 09/11/20 0714  WBC 18.4* 26.6* 15.1*  HGB 17.0 16.9 16.7  HCT 49.1 47.1 48.3  MCV 85.8  86.1 87.3  PLT 327 292 280   Basic Metabolic Panel: Recent Labs  Lab 09/09/20 0758 09/10/20 0551 09/11/20 0714  NA 140 136 139  K 3.8 3.9 4.0  CL 106 101 105  CO2 26 27 25   GLUCOSE 144* 109* 98  BUN 20 17 14   CREATININE 0.85 0.58* 0.73  CALCIUM 9.0 9.1 9.0  MG  --   --  2.3   GFR: Estimated Creatinine Clearance: 177.6 mL/min (by C-G formula based on SCr of 0.73 mg/dL). Liver Function Tests: Recent Labs  Lab 09/09/20 0758 09/10/20 0551  AST 15 14*  ALT 15 13  ALKPHOS 72 75  BILITOT 0.9 1.1  PROT 7.2 7.2  ALBUMIN 4.1 4.0   No results for input(s): LIPASE, AMYLASE in the last 168 hours. No results for input(s): AMMONIA in the last 168 hours. Coagulation Profile: No results for input(s): INR, PROTIME in the last 168 hours. Cardiac Enzymes: No results for input(s): CKTOTAL, CKMB, CKMBINDEX, TROPONINI in the last 168 hours. BNP (last 3 results) No results for input(s): PROBNP in the last 8760 hours. HbA1C: Recent Labs    09/09/20 1458 09/10/20 0551  HGBA1C 5.5 5.4   CBG: Recent Labs  Lab 09/09/20 0656 09/09/20 1745 09/10/20 0744 09/10/20 1235 09/10/20 1627  GLUCAP 150* 129* 101* 106* 75   Lipid Profile: Recent Labs    09/10/20 0551  CHOL 148  HDL 63  LDLCALC 67  TRIG 90  CHOLHDL 2.3   Thyroid Function Tests: No results for input(s): TSH, T4TOTAL, FREET4, T3FREE, THYROIDAB in the last 72 hours. Anemia Panel: No results for input(s): VITAMINB12, FOLATE, FERRITIN, TIBC, IRON, RETICCTPCT in the last 72 hours. Urine analysis:    Component Value Date/Time   COLORURINE STRAW (A) 09/09/2020 1141   APPEARANCEUR CLEAR (A) 09/09/2020 1141   LABSPEC 1.032 (H) 09/09/2020 1141   PHURINE 7.0 09/09/2020 1141   GLUCOSEU NEGATIVE 09/09/2020 1141   HGBUR NEGATIVE 09/09/2020 1141   BILIRUBINUR NEGATIVE 09/09/2020 1141   KETONESUR NEGATIVE 09/09/2020 1141   PROTEINUR NEGATIVE 09/09/2020 1141   NITRITE NEGATIVE 09/09/2020 1141   LEUKOCYTESUR NEGATIVE  09/09/2020 1141   Sepsis Labs: @LABRCNTIP (procalcitonin:4,lacticacidven:4)  ) Recent Results (from the past 240 hour(s))  SARS CORONAVIRUS 2 (TAT 6-24 HRS) Nasopharyngeal Nasopharyngeal Swab     Status: None   Collection Time: 09/09/20  2:33 PM   Specimen: Nasopharyngeal Swab  Result Value Ref Range Status   SARS Coronavirus 2 NEGATIVE NEGATIVE Final    Comment: (NOTE) SARS-CoV-2 target nucleic acids are NOT DETECTED.  The SARS-CoV-2 RNA is generally detectable in upper and lower respiratory specimens during the acute phase of infection. Negative results do not preclude SARS-CoV-2 infection, do not rule out co-infections with other pathogens, and should not be used as the sole basis for treatment or other patient management decisions. Negative results must be combined with clinical observations, patient history, and epidemiological information. The expected  result is Negative.  Fact Sheet for Patients: HairSlick.nohttps://www.fda.gov/media/138098/download  Fact Sheet for Healthcare Providers: quierodirigir.comhttps://www.fda.gov/media/138095/download  This test is not yet approved or cleared by the [place] FDA and  has been authorized for detection and/or diagnosis of SARS-CoV-2 by FDA under an Emergency Use Authorization (EUA). This EUA will remain  in effect (meaning this test can be used) for the duration of the COVID-19 declaration under Se ction 564(b)(1) of the Act, 21 U.S.C. section 360bbb-3(b)(1), unless the authorization is terminated or revoked sooner.  Performed at [facility], [address] [place], [place] [address]          Radiology Studies: CT ANGIO HEAD NECK W WO CM  Result Date: 09/11/2020 CLINICAL DATA:  Left vertebral dissection and left brainstem stroke with worsening headache, left arm weakness EXAM: CT ANGIOGRAPHY HEAD AND NECK TECHNIQUE: Multidetector CT imaging of the head and neck was performed using the standard protocol during bolus administration of  intravenous contrast. Multiplanar CT image reconstructions and MIPs were obtained to evaluate the vascular anatomy. Carotid stenosis measurements (when applicable) are obtained utilizing NASCET criteria, using the distal internal carotid diameter as the denominator. CONTRAST:  75mL OMNIPAQUE IOHEXOL 350 MG/ML SOLN COMPARISON:  CTA and MRI 09/09/2020 FINDINGS: CT HEAD Brain: There is no acute intracranial hemorrhage, mass effect, or edema. Gray-white differentiation is preserved. Small infarct of the lateral left medulla is better seen on the MRI. There is no extra-axial fluid collection. Ventricles and sulci are within normal limits in size and configuration. Vascular: No new findings. Skull: Calvarium is unremarkable. Sinuses/Orbits: No acute finding. Other: None. Review of the MIP images confirms the above findings CTA NECK Aortic arch: Patent great vessel origins. Aberrant origin of the right subclavian artery. Right carotid system: Patent. No stenosis or evidence of dissection. Left carotid system: Patent.  No stenosis or evidence of dissection. Vertebral arteries: Patent and codominant. As before, there is irregularity and narrowing of the left V2 vertebral artery, for example from C3 to C5. There is an additional area narrowing within the left C2 transverse foramen (series 11, image 197) and this appears slightly increased. Stenosis is moderate. Skeleton: No new finding. Other neck: No new finding. Upper chest: No new finding. Review of the MIP images confirms the above findings CTA HEAD Anterior circulation: Intracranial internal carotid arteries are patent. Anterior and middle cerebral arteries are patent. Posterior circulation: Intracranial right vertebral artery is patent. Intracranial left vertebral artery is patent with persistent multifocal narrowing, including focal severe stenosis. Appearance is similar to the prior study. Basilar artery is patent. Bilateral AICA and SCA origins are patent. A PICA is  not identified on either side. Posterior cerebral arteries are patent. Venous sinuses: Patent as allowed by contrast bolus timing. Review of the MIP images confirms the above findings IMPRESSION: No acute intracranial abnormality. Small infarct of medulla is better seen on prior MRI. Persistent findings of left vertebral artery dissection extracranially and intracranially. Slightly increased narrowing at the level of the C2 transverse foramen. Remains up to severe stenosis intracranially. Electronically Signed   By: Guadlupe SpanishPraneil  Patel M.D.   On: 09/11/2020 15:03   CT HEAD WO CONTRAST (5MM)  Result Date: 09/12/2020 CLINICAL DATA:  Stroke follow-up EXAM: CT HEAD WITHOUT CONTRAST TECHNIQUE: Contiguous axial images were obtained from the base of the skull through the vertex without intravenous contrast. COMPARISON:  Brain MRI 09/11/2020 FINDINGS: Brain: There is no mass, hemorrhage or extra-axial collection. The size and configuration of the ventricles and extra-axial  CSF spaces are normal. Faint hypoattenuation at the left medulla oblongata. Vascular: No abnormal hyperdensity of the major intracranial arteries or dural venous sinuses. No intracranial atherosclerosis. Skull: The visualized skull base, calvarium and extracranial soft tissues are normal. Sinuses/Orbits: No fluid levels or advanced mucosal thickening of the visualized paranasal sinuses. No mastoid or middle ear effusion. The orbits are normal. IMPRESSION: 1. No acute intracranial hemorrhage. 2. Faint hypoattenuation at the left medulla oblongata, compatible with recent infarct. Electronically Signed   By: Deatra Robinson M.D.   On: 09/12/2020 02:19   MR BRAIN WO CONTRAST  Result Date: 09/11/2020 CLINICAL DATA:  Neuro deficit, acute, stroke suspected. Left vertebral artery dissection with left lateral medullary stroke and worsening headache. EXAM: MRI HEAD WITHOUT CONTRAST TECHNIQUE: Multiplanar, multiecho pulse sequences of the brain and surrounding  structures were obtained without intravenous contrast. COMPARISON:  CT angiography same day.  MRI 09/09/2020. FINDINGS: Brain: Acute infarction of the left medulla appears more extensive compared to the study of 2 days ago. This may simply represent evolutionary change of the infarction or could represent actual extension. Patient now has clear T2 and FLAIR signal abnormality in the region of the infarction. Mild petechial blood products are present based on the SWI imaging. No frank hematoma. Otherwise, the brain appears normal. No cerebellar or occipital lobe stroke. Cerebral hemispheres appear entirely normal. No hydrocephalus or extra-axial collection. Vascular: Major vessels at the base of the brain show flow. Skull and upper cervical spine: Negative Sinuses/Orbits: Continued sinus inflammatory changes, particularly of the right frontal sinus. Other: None IMPRESSION: More conspicuous signal abnormality in the left medulla that could possibly be evolutionary change of the previous stroke, though some extension is not excluded. Petechial blood products in the region without measurable hematoma. Electronically Signed   By: Paulina Fusi M.D.   On: 09/11/2020 18:52   DG Chest Port 1 View  Result Date: 09/11/2020 CLINICAL DATA:  Chest pain. EXAM: PORTABLE CHEST 1 VIEW COMPARISON:  None. FINDINGS: Cardiomegaly. No focal consolidation, pleural effusion, or pneumothorax. No acute osseous abnormality. IMPRESSION: 1. Cardiomegaly. No active disease. Electronically Signed   By: Obie Dredge M.D.   On: 09/11/2020 07:22        Scheduled Meds:  aspirin EC  81 mg Oral Daily   clopidogrel  75 mg Oral Daily   gabapentin  300 mg Oral TID   meclizine  50 mg Oral TID   pantoprazole  40 mg Oral BID   Continuous Infusions:  sodium chloride 10 mL (09/12/20 0640)   promethazine (PHENERGAN) injection (IM or IVPB) 25 mg (09/12/20 9509)     LOS: 2 days    Time spent: 35 minutes  Signature  Susa Raring  M.D on 09/12/2020 at 10:29 AM   -  To page go to [email or web address]

## 2020-09-12 NOTE — Progress Notes (Signed)
Inpatient Rehabilitation Admissions Coordinator   Once I have access to all therapy treatment updates for today I will begin insurance Auth with [employer] [place]. CIR admit pending insurance approval when medically ready for discharge.  Ottie Glazier, RN, MSN Rehab Admissions Coordinator [phone] 09/12/2020 3:35 PM

## 2020-09-12 NOTE — Progress Notes (Signed)
Pt c/o decreased sensation to left neck after hearing it "crack" while in the bathroom. Pt can feel pressure with touch, but unable to distinguish between sharp and dull. Denies pain. No other changes in neuro exam. Notified Para March, MD. Stat CT of head ordered.

## 2020-09-12 NOTE — Progress Notes (Signed)
Physical Therapy Treatment Patient Details Name: Curtis [name] MRN: [id number] DOB: [birth year] Today's Date: 09/12/2020    History of Present Illness Pt is a 35 y.o. male with medical history significant for obesity, presents emergency department for chief concerns of left-sided numbness and headache on the left side.  He reports that the left-sided numbness, left-sided headache, dizziness, and nausea started after taking 1 dose of muscle relaxer at approximately 5 AM on day of presentation.   He further endorses occasional double vision, blurry vision that is beyond his baseline vision changes that requires corrective glasses. He endorses imbalance and dizziness which is severe per pt report. 09/10/20 MRI: Small acute stroke of the lateral left medulla.  MR of Cervical Spine: Possible crescentic abnormal signal is present about the left V2 vertebral  artery at the C3-C4 to C5 levels. Repeat imaging performed overnight with MD clearance for participation in therapy.    PT Comments    Pt seen for PT this date and eager to work. Scheduled around med admin for dizziness symptoms. Pt able to perform vestibular habituation exercises in supine/seated position with dizziness beginning at 3/10 increasing to moderate with activity. Eye patch donned to L eye for session. Follows commands well. Ambulation trial this date as pt has functional goal to ambulate to bathroom with staff for BM.. +2 required this session and RW. Slight ataxic gait noted with heavy cues for safe gait sequencing including correct BOS and foot placement along with vestibular cues for dizziness. Improved L UE strength this date with ability to hold phone. Pt very pleased with progress and very hopeful for CIR admission. Will continue to progress.  Follow Up Recommendations  CIR     Equipment Recommendations  Rolling walker with 5" wheels    Recommendations for Other Services Rehab consult     Precautions / Restrictions  Precautions Precautions: Fall Restrictions Weight Bearing Restrictions: No    Mobility  Bed Mobility Overal bed mobility: Modified Independent             General bed mobility comments: pt able to perform several log rolls prior to mobility transition. Approx 3/10 dizziness noted with transition to EOB. Once seated assisted in adjusting to upright midline posture as he tends to still present with L lateral lean.    Transfers Overall transfer level: Needs assistance Equipment used: Rolling walker (2 wheeled) Transfers: Sit to/from Stand Sit to Stand: Min assist         General transfer comment: able to stand with cues for hand placement. Once standing assisted with midline posture and safe BOS. Static standing balance improved  Ambulation/Gait Ambulation/Gait assistance: Min assist;Mod assist;+2 safety/equipment Gait Distance (Feet): 75 Feet Assistive device: Rolling walker (2 wheeled) Gait Pattern/deviations: Step-to pattern     General Gait Details: Effortful gait requiring RW and L lateral lean and several LOB requiring occasional mod assist for correction. With slow gait, balance improves, however with any challenge in speed, LOB noted with decreased ability to correct. Fatigues with increased distance with 1 standing rest break required   Stairs             Wheelchair Mobility    Modified Rankin (Stroke Patients Only)       Balance Overall balance assessment: Needs assistance Sitting-balance support: No upper extremity supported;Feet supported Sitting balance-Leahy Scale: Fair Sitting balance - Comments: cues for upright midline posture Postural control: Left lateral lean Standing balance support: Bilateral upper extremity supported Standing balance-Leahy Scale: Fair  Cognition Arousal/Alertness: Awake/alert Behavior During Therapy: WFL for tasks assessed/performed Overall Cognitive Status: Within Functional  Limits for tasks assessed                                        Exercises Other Exercises Other Exercises: Pt and family edcuated re: DME recs, d/c recs, stroke education, falls prevention, HEP- theraputty provided and excercises demonstrated, theraband exercises reviewed. Other Exercises: grooming, sup<>sit, sit<>stand x4, sitting/standing balance/tolerance, functinoal reach, 5L + 5R side steps x2 trials, 64forward/3backward steps x2 trials Other Exercises: vestibular compensation strategies performed in supine and seated position. HEP given and reviewed. Onset of dizziness with change of position, however improves with reps. 10 reps performed and educated on frequency and duration. Changed eye patch to L eye this date for HEP.    General Comments        Pertinent Vitals/Pain Pain Assessment: No/denies pain    Home Living                      Prior Function            PT Goals (current goals can now be found in the care plan section) Acute Rehab PT Goals Patient Stated Goal: to play golf PT Goal Formulation: With patient Time For Goal Achievement: 09/24/20 Potential to Achieve Goals: Good Progress towards PT goals: Progressing toward goals    Frequency    7X/week      PT Plan Current plan remains appropriate    Co-evaluation              AM-PAC PT "6 Clicks" Mobility   Outcome Measure  Help needed turning from your back to your side while in a flat bed without using bedrails?: A Little Help needed moving from lying on your back to sitting on the side of a flat bed without using bedrails?: A Little Help needed moving to and from a bed to a chair (including a wheelchair)?: A Lot Help needed standing up from a chair using your arms (e.g., wheelchair or bedside chair)?: A Lot Help needed to walk in hospital room?: Total Help needed climbing 3-5 steps with a railing? : Total 6 Click Score: 12    End of Session Equipment Utilized During  Treatment: Gait belt Activity Tolerance: Patient tolerated treatment well Patient left: in chair;with family/visitor present Nurse Communication: Mobility status PT Visit Diagnosis: Unsteadiness on feet (R26.81);Muscle weakness (generalized) (M62.81);History of falling (Z91.81);Difficulty in walking, not elsewhere classified (R26.2);Dizziness and giddiness (R42)     Time: 2440-1027 PT Time Calculation (min) (ACUTE ONLY): 34 min  Charges:  $Gait Training: 8-22 mins $Neuromuscular Re-education: 8-22 mins                     Elizabeth Palau, PT, DPT [phone]    Riane Rung 09/12/2020, 3:55 PM

## 2020-09-12 NOTE — Progress Notes (Signed)
Cross coverage note  Transient numbness right side of neck after hearing it 'crack'.  No headache or visual disturbance, facial numbness or weakness.  Had mild decrease sensation lateral neck, neuro exam otherwise unremarkable.  Stat CT head with no new findings.

## 2020-09-12 NOTE — Progress Notes (Addendum)
Inpatient Rehabilitation Admissions Coordinator  Noted neurology updates and recommendations from MRI. I will follow to assist with dispo as appropriate.  Ottie Glazier, RN, MSN Rehab Admissions Coordinator [phone] 09/12/2020 8:34 AM

## 2020-09-12 NOTE — Progress Notes (Signed)
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   Subjective: - MRI brain overnight did not show any clear extension of known L lateral medullary stroke. Petechial blood products. No frank hematoma. F/u CT head 6 hrs later showed no expansion of blood products. - Nystagmus and diplopia persist, eye patch helps - Dizziness improved on meclizine but not resolved. - Headache improved after starting gabapentin 300mg  tid and near resolved after adding tylenol+codeine - Nausea improved on phenergan   Exam: Vitals:   09/12/20 1215 09/12/20 1601  BP: 123/79 122/85  Pulse: 67 89  Resp: 17 17  Temp: 97.8 F (36.6 C) 98 F (36.7 C)  SpO2: 98% 100%   Physical Exam Gen: A&O x4, NAD HEENT: Atraumatic, normocephalic;mucous membranes moist; oropharynx clear, tongue without atrophy or fasciculations. Neck: Supple, trachea midline. Resp: CTAB, no w/r/r CV: RRR, no m/g/r; nml S1 and S2. 2+ symmetric peripheral pulses. Abd: soft/NT/ND; nabs x 4 quad Extrem: Nml bulk; no cyanosis, clubbing, or edema.   Neuro: *MS: A&O x4. Follows multi-step commands.  *Speech: fluid, nondysarthric, able to name and repeat *CN:    I: Deferred   II,III: PERRLA, VFF by confrontation, optic discs not visualized 2/2 pupillary constriction   III,IV,VI: EOMI w/ nystagmus on L lateral gaze, no ptosis   V: Sensation intact from V1 to V3 to LT   VII: Eyelid closure was full.  Smile symmetric.   VIII: Hearing intact to voice   IX,X: Voice normal, palate elevates symmetrically    XI: SCM/trap 5/5 bilat   XII: Tongue protrudes midline, no atrophy or fasciculations    *Motor:   Normal bulk.  No tremor, rigidity or bradykinesia. No pronator drift.     Strength: Dlt Bic Tri WrE WrF FgS Gr HF KnF KnE PlF DoF    Left 5 5 5 5 5 5 5 5 5 5 5 5     Right 5 5 5 5 5 5 5 5 5 5 5 5       *Sensory: Intact to light touch,  pinprick, temperature vibration throughout. Symmetric. Propioception intact bilat.  No double-simultaneous extinction.  *Coordination:  ataxia on FNF and HTS on L side only *Reflexes:  2+ and symmetric throughout without clonus; toes down-going bilat *Gait: deferred.   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 His vertigo, nystagmus (causing "bouncing vision" sensation, diplopia, and difficulty focusing), nausea, and intermittent hiccups are secondary to the stroke in this location.   Recommendations: - DAPT x3 mos. ASA 81mg  daily + plavix 75mg  daily - At 3 mos patient should have repeat vascular imaging to determine need to continue DAPT. - Avoid chiropractic manipulation, contact sports, any any activity that involves abrupt rotation and flexion-extension of neck - Goal is normotension. Avoid hypotension.  - Check LDL and A1c for overall stroke risk factor modification - Gapapentin 300mg  tid for headache + prn tylenol w/ codeine - Prn zofran +/- phenergan and meclizine for nausea and vertigo, respectively - Eye patch for patient to alternate eyes to improve diplopia and nausea in the acute setting - Will repeat head CT tmrw AM, if no expansion of blood products will restart pharm DVT prophylaxis - If acute change in neurologic status, activate inpatient stroke code - Will continue to follow  , MD [facility] [phone]  If 7pm- 7am, please page neurology on call as listed in [place].

## 2020-09-13 ENCOUNTER — Inpatient Hospital Stay: Payer: BC Managed Care – PPO

## 2020-09-13 ENCOUNTER — Inpatient Hospital Stay (HOSPITAL_COMMUNITY)
Admission: RE | Admit: 2020-09-13 | Discharge: 2020-09-26 | DRG: 057 | Disposition: A | Discharge status: Home / Self Care | Payer: BC Managed Care – PPO | Source: Other Acute Inpatient Hospital | Attending: Physical Medicine & Rehabilitation | Admitting: Physical Medicine & Rehabilitation

## 2020-09-13 ENCOUNTER — Encounter (HOSPITAL_COMMUNITY): Payer: Self-pay | Admitting: Physical Medicine & Rehabilitation

## 2020-09-13 ENCOUNTER — Other Ambulatory Visit: Payer: Self-pay

## 2020-09-13 DIAGNOSIS — H5712 Ocular pain, left eye: Secondary | ICD-10-CM | POA: Diagnosis not present

## 2020-09-13 DIAGNOSIS — E6609 Other obesity due to excess calories: Secondary | ICD-10-CM

## 2020-09-13 DIAGNOSIS — Z7902 Long term (current) use of antithrombotics/antiplatelets: Secondary | ICD-10-CM

## 2020-09-13 DIAGNOSIS — Z79899 Other long term (current) drug therapy: Secondary | ICD-10-CM

## 2020-09-13 DIAGNOSIS — I6502 Occlusion and stenosis of left vertebral artery: Secondary | ICD-10-CM | POA: Diagnosis not present

## 2020-09-13 DIAGNOSIS — G464 Cerebellar stroke syndrome: Secondary | ICD-10-CM | POA: Diagnosis not present

## 2020-09-13 DIAGNOSIS — H532 Diplopia: Secondary | ICD-10-CM | POA: Diagnosis present

## 2020-09-13 DIAGNOSIS — Z7982 Long term (current) use of aspirin: Secondary | ICD-10-CM

## 2020-09-13 DIAGNOSIS — H4912 Fourth [trochlear] nerve palsy, left eye: Secondary | ICD-10-CM | POA: Diagnosis present

## 2020-09-13 DIAGNOSIS — I639 Cerebral infarction, unspecified: Secondary | ICD-10-CM | POA: Insufficient documentation

## 2020-09-13 DIAGNOSIS — Z6836 Body mass index (BMI) 36.0-36.9, adult: Secondary | ICD-10-CM

## 2020-09-13 DIAGNOSIS — Q211 Atrial septal defect, unspecified: Secondary | ICD-10-CM

## 2020-09-13 DIAGNOSIS — H5500 Unspecified nystagmus: Secondary | ICD-10-CM | POA: Diagnosis not present

## 2020-09-13 DIAGNOSIS — D72829 Elevated white blood cell count, unspecified: Secondary | ICD-10-CM | POA: Diagnosis not present

## 2020-09-13 DIAGNOSIS — G9389 Other specified disorders of brain: Secondary | ICD-10-CM | POA: Diagnosis not present

## 2020-09-13 DIAGNOSIS — I69398 Other sequelae of cerebral infarction: Secondary | ICD-10-CM | POA: Diagnosis not present

## 2020-09-13 DIAGNOSIS — M7918 Myalgia, other site: Secondary | ICD-10-CM | POA: Diagnosis not present

## 2020-09-13 DIAGNOSIS — R519 Headache, unspecified: Secondary | ICD-10-CM | POA: Diagnosis not present

## 2020-09-13 DIAGNOSIS — E669 Obesity, unspecified: Secondary | ICD-10-CM | POA: Diagnosis not present

## 2020-09-13 DIAGNOSIS — K5901 Slow transit constipation: Secondary | ICD-10-CM | POA: Diagnosis not present

## 2020-09-13 DIAGNOSIS — I7774 Dissection of vertebral artery: Secondary | ICD-10-CM | POA: Diagnosis not present

## 2020-09-13 DIAGNOSIS — Z6841 Body Mass Index (BMI) 40.0 and over, adult: Secondary | ICD-10-CM | POA: Diagnosis not present

## 2020-09-13 DIAGNOSIS — F17290 Nicotine dependence, other tobacco product, uncomplicated: Secondary | ICD-10-CM | POA: Diagnosis present

## 2020-09-13 DIAGNOSIS — I6389 Other cerebral infarction: Secondary | ICD-10-CM | POA: Diagnosis not present

## 2020-09-13 DIAGNOSIS — M542 Cervicalgia: Secondary | ICD-10-CM | POA: Diagnosis not present

## 2020-09-13 DIAGNOSIS — R531 Weakness: Secondary | ICD-10-CM | POA: Diagnosis present

## 2020-09-13 IMAGING — CT CT HEAD W/O CM
3 series · 16 of 47 positions shown, 19 images · non-contrast
Comparison: CT head from yesterday.

CLINICAL DATA: Headache and right upper extremity numbness.

EXAM:
CT HEAD WITHOUT CONTRAST
TECHNIQUE: Contiguous axial images were obtained from the base of the skull
through the vertex without intravenous contrast.

[Series 3: head wo · axial · 0.44mm/px · z∈[-157,-27]mm · 10 of 32 slices shown, 13 images]
[im 3/32  brain]
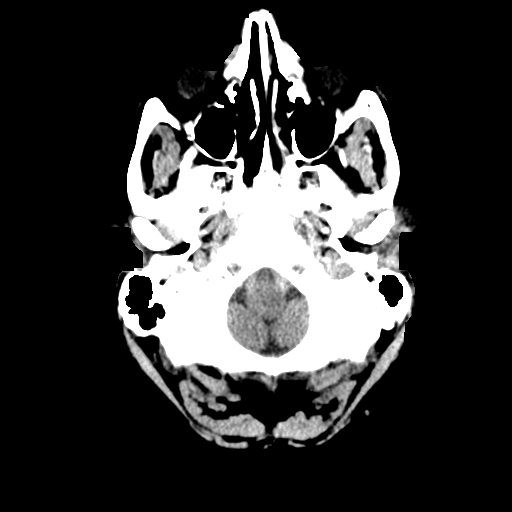
[im 3/32  bone]
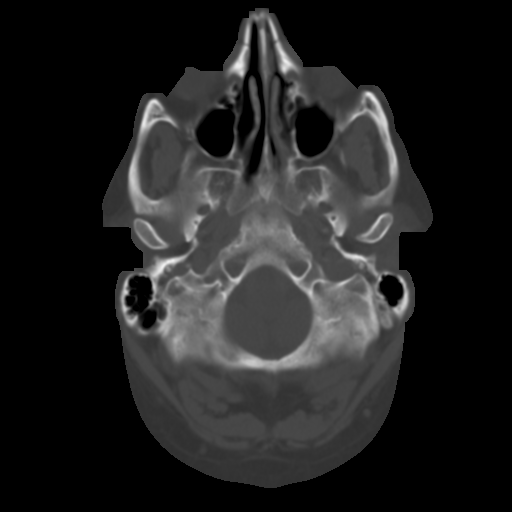
[im 6/32  brain]
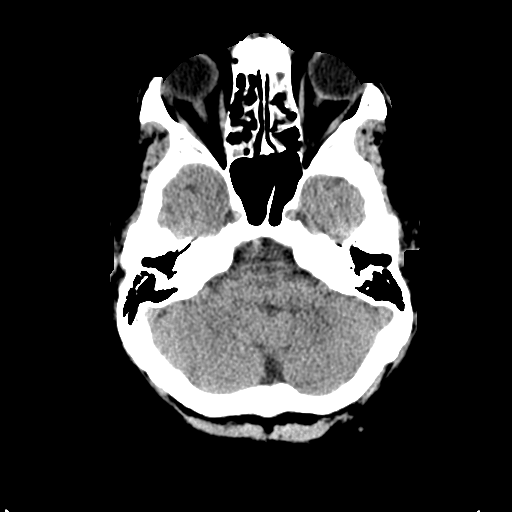
[im 9/32  brain]
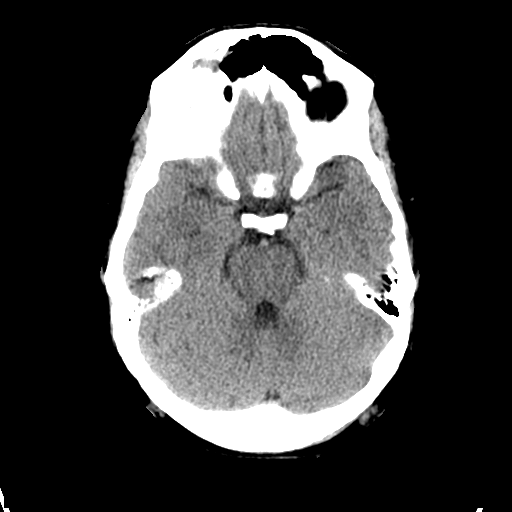
[im 11/32  brain]
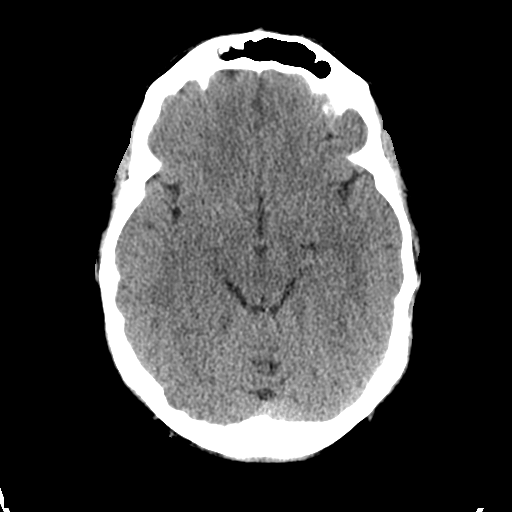
[im 14/32  brain]
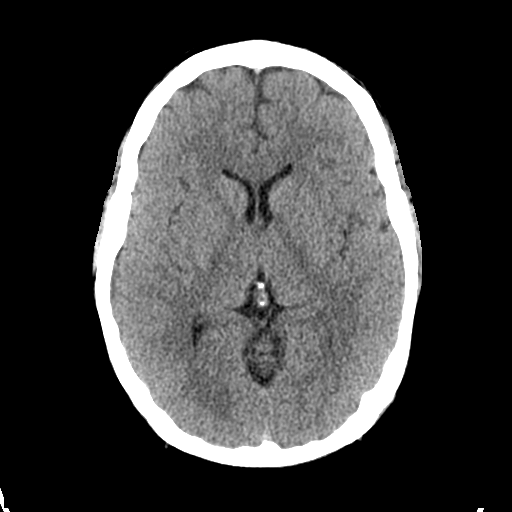
[im 14/32  bone]
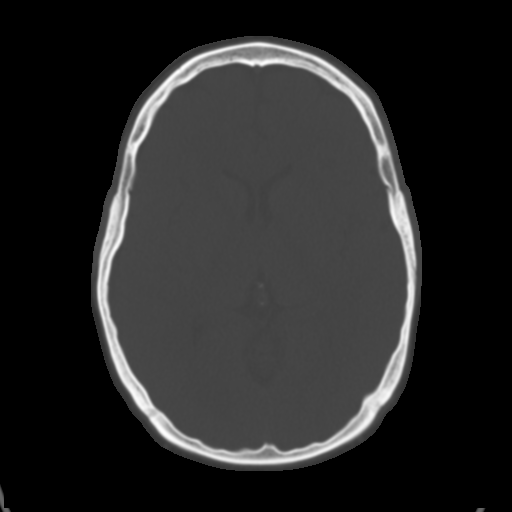
[im 18/32  brain]
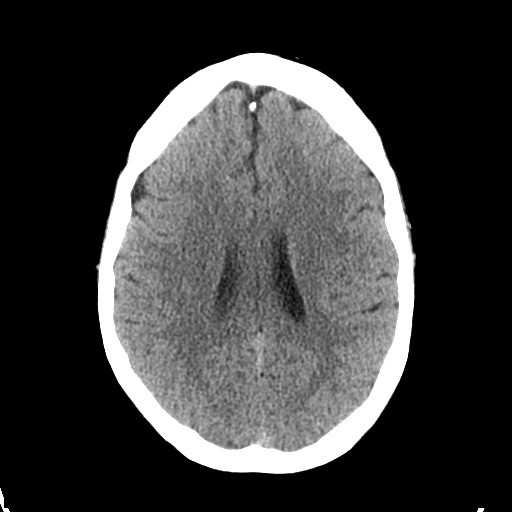
[im 21/32  brain]
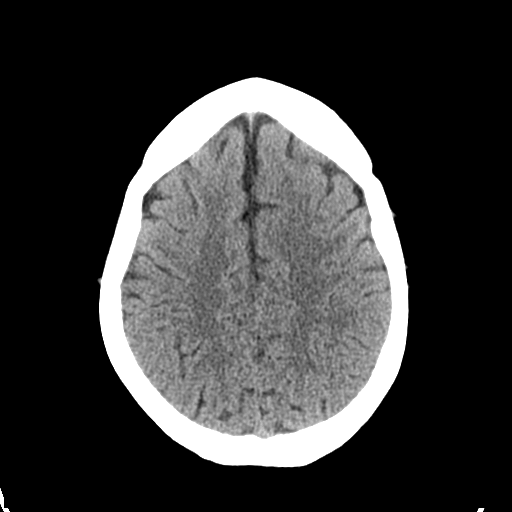
[im 24/32  brain]
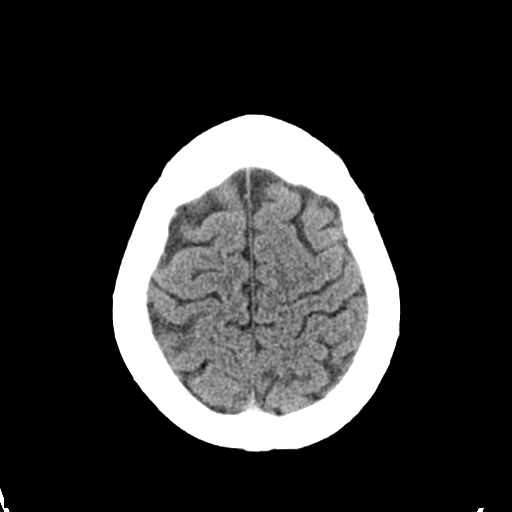
[im 26/32  brain]
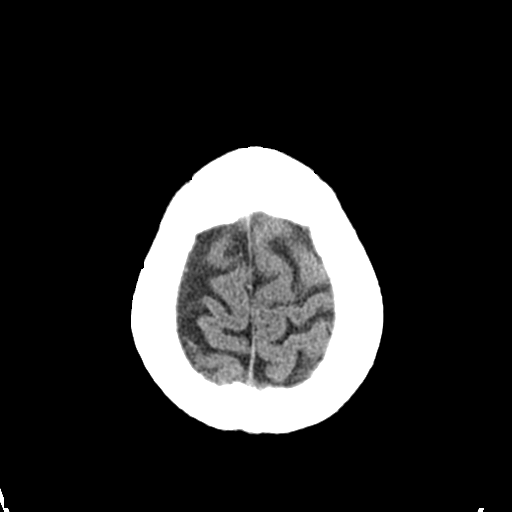
[im 26/32  bone]
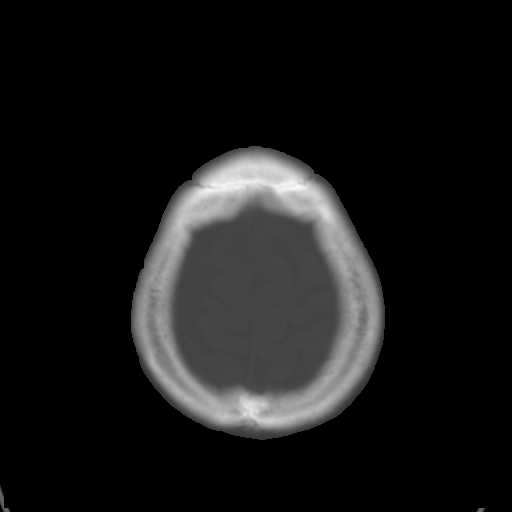
[im 29/32  brain]
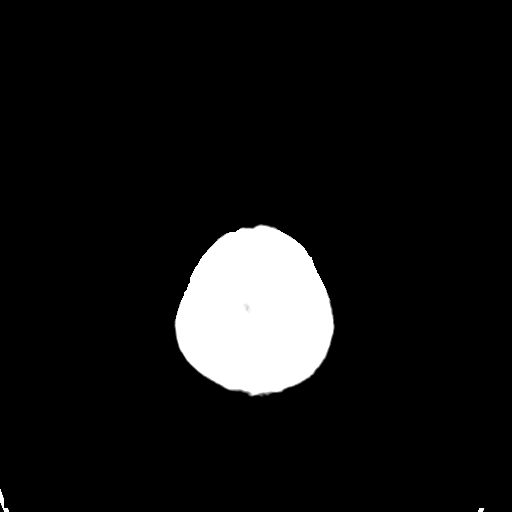

[Series 4: coronal soft tissue · coronal · 0.34mm/px · 3 of 72 slices shown]
[im 24/72  brain]
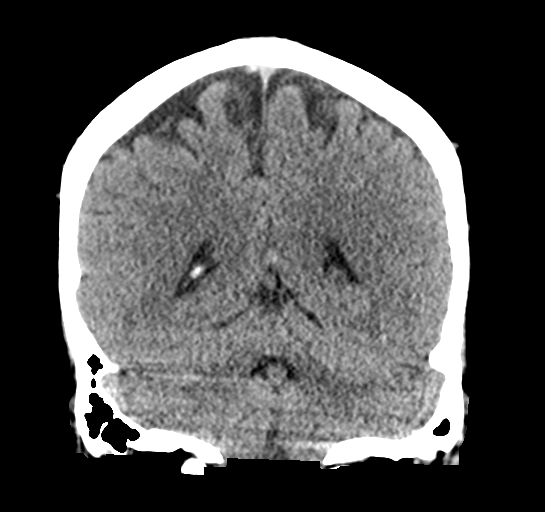
[im 32/72  brain]
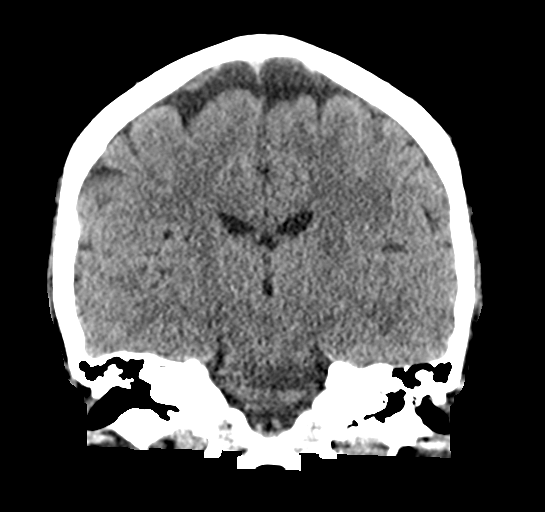
[im 40/72  brain]
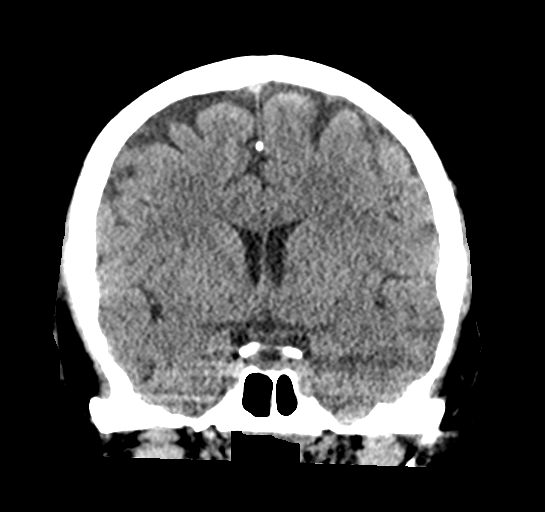

[Series 5: sagittal soft tissue · sagittal · 0.33mm/px · 3 of 62 slices shown]
[im 21/62  brain]
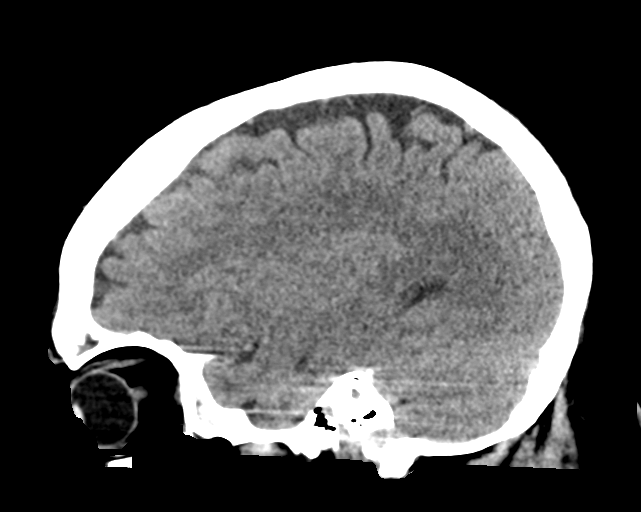
[im 31/62  brain]
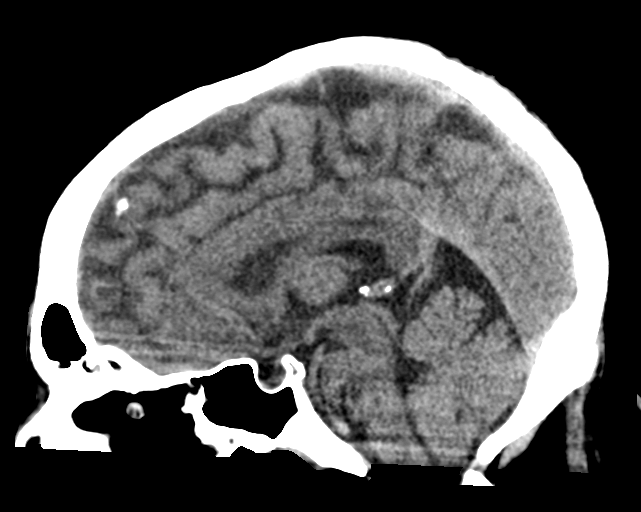
[im 41/62  brain]
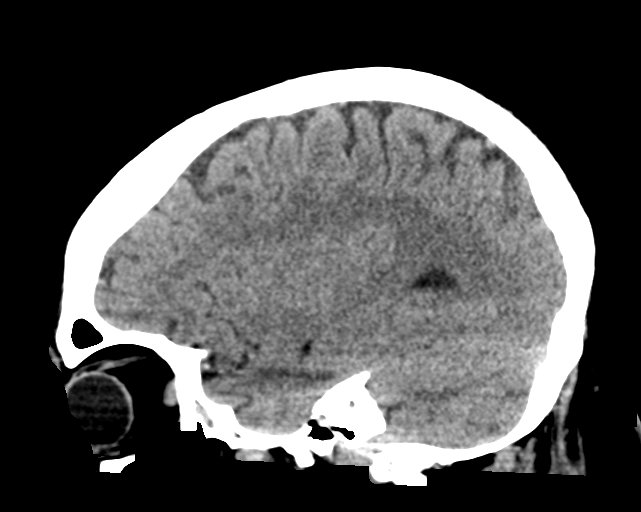

[16 of 47 positions shown; findings below may reference images not displayed]

FINDINGS: Brain: Unchanged subtle hypodensity in the left medulla consistent
with known infarct. No hemorrhage by CT. No hydrocephalus,
extra-axial collection or mass lesion/mass effect.

Vascular: No hyperdense vessel or unexpected calcification.

Skull: Normal. Negative for fracture or focal lesion.

Sinuses/Orbits: Unchanged mucosal thickening of the ethmoid air
cells and right frontal sinus.

Other: None.
IMPRESSION: 1. Unchanged subtle hypodensity in the left medulla consistent with
known infarct. No hemorrhage by CT.

## 2020-09-13 MED ORDER — ACETAMINOPHEN-CODEINE #3 300-30 MG PO TABS
1.0000 | ORAL_TABLET | Freq: Four times a day (QID) | ORAL | Status: DC | PRN
  Administered 2020-09-13 – 2020-09-25 (×22): 1 via ORAL
  Filled 2020-09-13 (×25): qty 1

## 2020-09-13 MED ORDER — ACETAMINOPHEN-CODEINE #3 300-30 MG PO TABS
1.0000 | ORAL_TABLET | Freq: Once | ORAL | Status: DC
Start: 2020-09-13 — End: 2020-09-13

## 2020-09-13 MED ORDER — CLOPIDOGREL BISULFATE 75 MG PO TABS: 75.0000 mg | ORAL_TABLET | Freq: Every day | ORAL | Status: DC

## 2020-09-13 MED ORDER — CLOPIDOGREL BISULFATE 75 MG PO TABS
75.0000 mg | ORAL_TABLET | Freq: Every day | ORAL | Status: DC
  Administered 2020-09-14 – 2020-09-26 (×13): 75 mg via ORAL
  Filled 2020-09-13 (×13): qty 1

## 2020-09-13 MED ORDER — ASPIRIN EC 81 MG PO TBEC
81.0000 mg | DELAYED_RELEASE_TABLET | Freq: Every day | ORAL | Status: DC
  Administered 2020-09-14 – 2020-09-26 (×13): 81 mg via ORAL
  Filled 2020-09-13 (×13): qty 1

## 2020-09-13 MED ORDER — ACETAMINOPHEN-CODEINE #3 300-30 MG PO TABS: 1.0000 | ORAL_TABLET | Freq: Four times a day (QID) | ORAL | 0 refills | Status: DC | PRN

## 2020-09-13 MED ORDER — ALUM & MAG HYDROXIDE-SIMETH 200-200-20 MG/5ML PO SUSP: 30.0000 mL | ORAL | Status: DC | PRN

## 2020-09-13 MED ORDER — ACETAMINOPHEN-CODEINE #4 300-60 MG PO TABS
1.0000 | ORAL_TABLET | Freq: Once | ORAL | Status: DC
Start: 2020-09-13 — End: 2020-09-13

## 2020-09-13 MED ORDER — CALCIUM CARBONATE ANTACID 500 MG PO CHEW: 1.0000 | CHEWABLE_TABLET | Freq: Three times a day (TID) | ORAL | Status: DC | PRN

## 2020-09-13 MED ORDER — ASPIRIN 81 MG PO TBEC: 81.0000 mg | DELAYED_RELEASE_TABLET | Freq: Every day | ORAL | 11 refills | Status: DC

## 2020-09-13 MED ORDER — MECLIZINE HCL 25 MG PO TABS
50.0000 mg | ORAL_TABLET | Freq: Three times a day (TID) | ORAL | Status: DC
  Administered 2020-09-13 – 2020-09-26 (×39): 50 mg via ORAL
  Filled 2020-09-13 (×40): qty 2

## 2020-09-13 MED ORDER — MECLIZINE HCL 50 MG PO TABS: 50.0000 mg | ORAL_TABLET | Freq: Three times a day (TID) | ORAL | 0 refills | Status: DC

## 2020-09-13 MED ORDER — PANTOPRAZOLE SODIUM 40 MG PO TBEC: 40.0000 mg | DELAYED_RELEASE_TABLET | Freq: Every day | ORAL | Status: DC

## 2020-09-13 MED ORDER — GABAPENTIN 300 MG PO CAPS: 300.0000 mg | ORAL_CAPSULE | Freq: Three times a day (TID) | ORAL | Status: DC

## 2020-09-13 MED ORDER — PANTOPRAZOLE SODIUM 40 MG PO TBEC
40.0000 mg | DELAYED_RELEASE_TABLET | Freq: Two times a day (BID) | ORAL | Status: DC
  Administered 2020-09-13 – 2020-09-26 (×26): 40 mg via ORAL
  Filled 2020-09-13 (×26): qty 1

## 2020-09-13 MED ORDER — CHLORPROMAZINE HCL 25 MG PO TABS
25.0000 mg | ORAL_TABLET | Freq: Four times a day (QID) | ORAL | Status: DC | PRN
  Administered 2020-09-14 – 2020-09-17 (×3): 25 mg via ORAL
  Filled 2020-09-13 (×4): qty 1

## 2020-09-13 MED ORDER — SENNOSIDES-DOCUSATE SODIUM 8.6-50 MG PO TABS
1.0000 | ORAL_TABLET | Freq: Every evening | ORAL | Status: DC | PRN
  Administered 2020-09-15 – 2020-09-22 (×2): 1 via ORAL
  Filled 2020-09-13 (×2): qty 1

## 2020-09-13 MED ORDER — GABAPENTIN 300 MG PO CAPS
300.0000 mg | ORAL_CAPSULE | Freq: Three times a day (TID) | ORAL | Status: DC
  Administered 2020-09-13 – 2020-09-26 (×39): 300 mg via ORAL
  Filled 2020-09-13 (×39): qty 1

## 2020-09-13 NOTE — Progress Notes (Addendum)
Inpatient Rehabilitation Medication Review by a Pharmacist  A complete drug regimen review was completed for this patient to identify any potential clinically significant medication issues.  High Risk Drug Classes Is patient taking? Indication by Medication  Antipsychotic No   Anticoagulant No   Antibiotic No   Opioid No   Antiplatelet Yes Plavix  Hypoglycemics/insulin No   Vasoactive Medication No   Chemotherapy No   Other No      Type of Medication Issue Identified Description of Issue Recommendation(s)  Drug Interaction(s) (clinically significant)     Duplicate Therapy     Allergy     No Medication Administration End Date     Incorrect Dose     Additional Drug Therapy Needed     Significant med changes from prior encounter (inform family/care partners about these prior to discharge).    Other       Clinically significant medication issues were identified that warrant physician communication and completion of prescribed/recommended actions by midnight of the next day:  No   Pharmacist comments: No other issues  Time spent performing this drug regimen review (minutes):  10 minutes   Elwin Sleight 09/13/2020 3:18 PM

## 2020-09-13 NOTE — Plan of Care (Signed)
  Problem: Consults Goal: RH STROKE PATIENT EDUCATION Description: See Patient Education module for education specifics  Outcome: Progressing   Problem: RH BOWEL ELIMINATION Goal: RH STG MANAGE BOWEL WITH ASSISTANCE Description: STG Manage Bowel with mod I  Assistance. Outcome: Progressing Goal: RH STG MANAGE BOWEL W/MEDICATION W/ASSISTANCE Description: STG Manage Bowel with Medication with mod I Assistance. Outcome: Progressing   Problem: RH SAFETY Goal: RH STG ADHERE TO SAFETY PRECAUTIONS W/ASSISTANCE/DEVICE Description: STG Adhere to Safety Precautions With  cues/reminders Assistance/Device. Outcome: Progressing   Problem: RH PAIN MANAGEMENT Goal: RH STG PAIN MANAGED AT OR BELOW PT'S PAIN GOAL Description: At or below level 4 Outcome: Progressing   Problem: RH KNOWLEDGE DEFICIT Goal: RH STG INCREASE KNOWLEDGE OF STROKE PROPHYLAXIS Description: Patient will be able to manage secondary stroke with medications and dietary modifications independently Outcome: Progressing   

## 2020-09-13 NOTE — Plan of Care (Signed)
Neurology Plan of Care  See full progress note from yesterday. Head CT this AM looked stable with no expansion of petechial blood products. Patient discharged to CIR today.  Recommendations: - DAPT x3 mos. ASA 81mg  daily + plavix 75mg  daily - At 3 mos patient should have repeat vascular imaging to determine need to continue DAPT. - Avoid chiropractic manipulation, contact sports, any any activity that involves abrupt rotation and flexion-extension of neck - Gapapentin 300mg  tid for headache. May be uptitrated as needed. - Prn zofran +/- phenergan and meclizine for nausea and vertigo, respectively - Eye patch for patient to alternate eyes to improve diplopia and nausea in the acute setting - Referral to outpatient neurology upon discharge from [facility]  , MD [facility] [phone]  If 7pm- 7am, please page neurology on call as listed in [place].

## 2020-09-13 NOTE — Plan of Care (Signed)
End of Shift Summary:  Pt c/o numbness to right neck/shoulder/arm - see progress note. CT of head completed. Pain managed with prn medications. Denies n/v. Ambulated in room with 2 contact guard assist and rolling walker. (+) BM, urine output adequate this shift. Neuro checks q4h. Remained free from falls or injury. Call bell within reach and able to use.   Problem: Education: Goal: Knowledge of disease or condition will improve Outcome: Progressing Goal: Knowledge of secondary prevention will improve Outcome: Progressing Goal: Knowledge of patient specific risk factors addressed and post discharge goals established will improve Outcome: Progressing Goal: Individualized Educational Video(s) Outcome: Progressing   Problem: Clinical Measurements: Goal: Diagnostic test results will improve Outcome: Progressing   Problem: Activity: Goal: Risk for activity intolerance will decrease Outcome: Progressing   Problem: Coping: Goal: Level of anxiety will decrease Outcome: Progressing   Problem: Pain Managment: Goal: General experience of comfort will improve Outcome: Progressing

## 2020-09-13 NOTE — TOC Transition Note (Signed)
Transition of Care [facility]) - CM/SW Discharge Note   Patient Details  Name: Curtis [name] MRN: [id number] Date of Birth: 03/09/1985  Transition of Care [facility]) CM/SW Contact:  Allayne Butcher, RN Phone Number: 09/13/2020, 10:04 AM   Clinical Narrative:    Patient medically cleared for discharge to CIR today.  CIR has received insurance authorization.  CIR has arranged [facility] to transport patient.    Final next level of care: Skilled Nursing Facility Barriers to Discharge: Barriers Resolved   Patient Goals and CMS Choice Patient states their goals for this hospitalization and ongoing recovery are:: Patient wants to go to CIR when medically ready CMS Medicare.gov Compare Post Acute Care list provided to:: Patient Choice offered to / list presented to : Patient  Discharge Placement              Patient chooses bed at: Other - please specify in the comment section below: [facility] Inpatient rehab) Patient to be transferred to facility by: [facility] Name of family member notified: patient and family aware Patient and family notified of of transfer: 09/13/20  Discharge Plan and Services   Discharge Planning Services: CM Consult Post Acute Care Choice: IP Rehab          DME Arranged: N/A DME Agency: NA       HH Arranged: NA HH Agency: NA        Social Determinants of Health (SDOH) Interventions     Readmission Risk Interventions No flowsheet data found.

## 2020-09-13 NOTE — Discharge Instructions (Addendum)
Inpatient Rehab Discharge Instructions  TONNY ISENSEE Discharge date and time: No discharge date for patient encounter.   Activities/Precautions/ Functional Status: Activity: activity as tolerated Diet: regular diet Wound Care: Routine skin checks Functional status:  ___ No restrictions     ___ Walk up steps independently ___ 24/7 supervision/assistance   ___ Walk up steps with assistance ___ Intermittent supervision/assistance  ___ Bathe/dress independently ___ Walk with walker     __x_ Bathe/dress with assistance ___ Walk Independently    ___ Shower independently ___ Walk with assistance    ___ Shower with assistance ___ No alcohol     ___ Return to work/school ________   COMMUNITY REFERRALS UPON DISCHARGE:    Outpatient: PT     OT             Agency:[facility]   [phone]              Appointment Date/Time:*Please expect follow-up within 7-10 business days to schedule your appointment. If you have not received follow-up, please be sure to contact the site directly.*  Medical Equipment/Items Ordered: Rollator                                                  Agency/Supplier: [employer]    Special Instructions: New Patient Visit with Larae Grooms on Thursday November 14, 2020. Please arrive by 8:05 AM, appointment starts at 8:20 AM. At [facility] [address] [place] [address] [phone] No driving smoking or alcohol  Continue aspirin 81 mg daily and Plavix 75 mg daily x3 months then repeat vascular imaging to determine need to continue dual antiplatelet therapy   My questions have been answered and I understand these instructions. I will adhere to these goals and the provided educational materials after my discharge from the hospital.  Patient/Caregiver Signature _______________________________ Date __________  Clinician Signature _______________________________________ Date __________  Please bring this form and your  medication list with you to all your follow-up doctor's appointments.

## 2020-09-13 NOTE — Discharge Summary (Addendum)
Curtis [name]:[id number] DOB: 1985/06/20 DOA: 09/09/2020  PCP: Pcp, No  Admit date: 09/09/2020  Discharge date: 09/13/2020  Admitted From: Home  Disposition:  CIR   Recommendations for Outpatient Follow-up:   Follow up with PCP in 1-2 weeks  PCP Please obtain BMP/CBC, 2 view CXR in 1week,  (see Discharge instructions)   PCP Please follow up on the following pending results:    Home Health: None   Equipment/Devices: None  Consultations: Neuro Discharge Condition: Stable    CODE STATUS: Full    Diet Recommendation: Heart Healthy     Chief Complaint  Patient presents with   Weakness     Brief history of present illness from the day of admission and additional interim summary    [name] is a 35 y.o. M with obesity no other significant PMHx presented with acute onset nausea, dizziness.   Patient had recently seen a chiropractor who is being treated with cervical manipulation.  On the morning of admission he developed sudden dizziness, nausea, neck pain, and headache and so he came to the ER.  CT angiogram was obtained that showed findings consistent with dissection of the left vertebral artery.  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  Continue supportive care with dual antiplatelet therapy for 3 months thereafter Plavix, continue statin indefinitely, echo not needed per neurology.  Full stroke pathway followed.  Continue supportive care  for headache, has qualified for CIR and will be discharged to CIR, requested staff over there to please involve stroke team for any continued monitoring and future recommendations.  Has been advised refrain from future chiropractic manipulations which likely caused the present vertebral artery injury.  For now continuing permissive hypertension.     Headache, Vertigo & Diplopia  due to above  - These are severe and debilitating at present.  Supportive care with Ativan, meclizine and Phenergan.  Continue PT OT.  May require CIR.  Neurontin for headaches.  Case discussed with neurologist Dr. Selina Cooley on 09/11/2020.  Headache medications adjusted further good improvement.     Obesity - BMI 36 with PCP for weight loss.   GERD - Continue PPI    Leukocytosis - Likely reactive, no suspected infection, trend improving.  Check CBC and CMP in 1 to 2 days at CIR.   Discharge diagnosis     Active Problems:   Vertebral artery  dissection (HCC)   Obesity (BMI 35.0-39.9 without comorbidity)   Essential hypertension   Stroke [facility])   Lateral medullary syndrome    Discharge instructions    Discharge Instructions     Diet - low sodium heart healthy   Complete by: As directed    Increase activity slowly   Complete by: As directed        Discharge Medications   Allergies as of 09/13/2020   No Known Allergies      Medication List     STOP taking these medications    cyclobenzaprine 5 MG tablet Commonly known as: FLEXERIL   methylPREDNISolone 4 MG Tbpk tablet Commonly known as: MEDROL DOSEPAK       TAKE these medications    acetaminophen-codeine 300-30 MG tablet Commonly known as: TYLENOL #3 Take 1 tablet by mouth every 6 (six) hours as needed for moderate pain.   aspirin 81 MG EC tablet Take 1 tablet (81 mg total) by mouth daily. Swallow whole. Start taking on: September 14, 2020   clopidogrel 75 MG tablet Commonly known as: PLAVIX Take 1 tablet (75 mg total) by mouth  daily. Start taking on: September 14, 2020   gabapentin 300 MG capsule Commonly known as: NEURONTIN Take 1 capsule (300 mg total) by mouth 3 (three) times daily.   meclizine 50 MG tablet Commonly known as: ANTIVERT Take 1 tablet (50 mg total) by mouth 3 (three) times daily.   pantoprazole 40 MG tablet Commonly known as: PROTONIX Take 1 tablet (40 mg total) by mouth daily.          Major procedures and Radiology Reports - PLEASE review detailed and final reports thoroughly  -       CT ANGIO HEAD NECK W WO CM  Result Date: 09/11/2020 CLINICAL DATA:  Left vertebral dissection and left brainstem stroke with worsening headache, left arm weakness EXAM: CT ANGIOGRAPHY HEAD AND NECK TECHNIQUE: Multidetector CT imaging of the head and neck was performed using the standard protocol during bolus administration of intravenous contrast. Multiplanar CT image reconstructions and MIPs were obtained to evaluate the vascular anatomy. Carotid stenosis measurements (when applicable) are obtained utilizing NASCET criteria, using the distal internal carotid diameter as the denominator. CONTRAST:  95mL OMNIPAQUE IOHEXOL 350 MG/ML SOLN COMPARISON:  CTA and MRI 09/09/2020 FINDINGS: CT HEAD Brain: There is no acute intracranial hemorrhage, mass effect, or edema. Gray-white differentiation is preserved. Small infarct of the lateral left medulla is better seen on the MRI. There is no extra-axial fluid collection. Ventricles and sulci are within normal limits in size and configuration. Vascular: No new findings. Skull: Calvarium is unremarkable. Sinuses/Orbits: No acute finding. Other: None. Review of the MIP images confirms the above findings CTA NECK Aortic arch: Patent great vessel origins. Aberrant origin of the right subclavian artery. Right carotid system: Patent. No stenosis or evidence of dissection. Left carotid system: Patent.  No stenosis or evidence of dissection. Vertebral arteries: Patent and codominant. As  before, there is irregularity and narrowing of the left V2 vertebral artery, for example from C3 to C5. There is an additional area narrowing within the left C2 transverse foramen (series 11, image 197) and this appears slightly increased. Stenosis is moderate. Skeleton: No new finding. Other neck: No new finding. Upper chest: No new finding. Review of the MIP images confirms the above findings CTA HEAD Anterior circulation: Intracranial internal carotid arteries are patent. Anterior and middle cerebral arteries are patent. Posterior circulation: Intracranial right vertebral artery is patent.  Intracranial left vertebral artery is patent with persistent multifocal narrowing, including focal severe stenosis. Appearance is similar to the prior study. Basilar artery is patent. Bilateral AICA and SCA origins are patent. A PICA is not identified on either side. Posterior cerebral arteries are patent. Venous sinuses: Patent as allowed by contrast bolus timing. Review of the MIP images confirms the above findings IMPRESSION: No acute intracranial abnormality. Small infarct of medulla is better seen on prior MRI. Persistent findings of left vertebral artery dissection extracranially and intracranially. Slightly increased narrowing at the level of the C2 transverse foramen. Remains up to severe stenosis intracranially. Electronically Signed   By: Guadlupe Spanish M.D.   On: 09/11/2020 15:03   CT ANGIO HEAD NECK W WO CM  Result Date: 09/09/2020 CLINICAL DATA:  Provided history: Dizziness, persistent/recurrent, cardiac or vascular cause suspected; left sided neck pain s/p chiropractic adjustment with HA. EXAM: CT ANGIOGRAPHY HEAD AND NECK TECHNIQUE: Multidetector CT imaging of the head and neck was performed using the standard protocol during bolus administration of intravenous contrast. Multiplanar CT image reconstructions and MIPs were obtained to evaluate the vascular anatomy. Carotid stenosis measurements (when  applicable) are obtained utilizing NASCET criteria, using the distal internal carotid diameter as the denominator. CONTRAST:  75mL OMNIPAQUE IOHEXOL 350 MG/ML SOLN COMPARISON:  None FINDINGS: CT HEAD FINDINGS Brain: Cerebral volume is normal. There is no acute intracranial hemorrhage. No demarcated cortical infarct. No extra-axial fluid collection. No evidence of an intracranial mass. No midline shift. Vascular: No hyperdense vessel. Skull: Normal. Negative for fracture or focal lesion. Sinuses: Moderate partial opacification of the right frontal sinus. Mild-to-moderate mucosal thickening within the left frontal sinus. Moderate partial opacification of the bilateral ethmoid air cells, likely due to the presence of mucosal thickening and fluid. Mild left maxillary sinus mucosal thickening. Orbits: No mass or acute finding. Review of the MIP images confirms the above findings CTA NECK FINDINGS Aortic arch: Aberrant right subclavian artery. The origin of the right subclavian artery is incompletely included in the field of view. The visualized aortic arch is normal in caliber. No hemodynamically significant stenosis within the proximal subclavian arteries. Right carotid system: CCA and ICA patent within the neck without stenosis or significant atherosclerotic disease. No evidence of dissection. Left carotid system: CCA and ICA patent within the neck without stenosis or significant atherosclerotic disease. No evidence of dissection Vertebral arteries: Venous contrast reflux limits evaluation of the V1 and proximal V2 segments of the right vertebral artery. Within this limitation, the right vertebral artery is patent within the neck without stenosis or evidence of dissection. The left vertebral artery is patent within the neck. However, there are sites of irregularity and mild to moderate narrowing within the V2 left vertebral artery highly suspicious for sequela of dissection given the provided history (for instance as  seen on series 7, image 137). Skeleton: No acute bony abnormality or aggressive osseous lesion. Other neck: No neck mass or cervical lymphadenopathy. Upper chest: No consolidation within the imaged lung apices. Review of the MIP images confirms the above findings CTA HEAD FINDINGS Anterior circulation: The intracranial internal carotid arteries are patent. The M1 middle cerebral arteries are patent. No M2 proximal branch occlusion or high-grade proximal stenosis is identified. The anterior cerebral arteries are patent. No intracranial aneurysm is identified. Posterior circulation: The intracranial right vertebral artery is patent without stenosis. There is irregularity and sites of up to moderate/severe narrowing within the V4 left vertebral artery, highly suspicious for dissection given the provided history (  for instance as seen on series 7, images 211 and 212) (series 8, images 131 and 132). The basilar artery is patent. The posterior cerebral arteries are patent. The posterior communicating arteries are hypoplastic or absent bilaterally. Venous sinuses: Within the limitations of contrast timing, no convincing thrombus. Anatomic variants: As described. Review of the MIP images confirms the above findings These results were called by telephone at the time of interpretation on 09/09/2020 at 10:35 am to provider Jene Every , who verbally acknowledged these results. IMPRESSION: CT head: 1. No evidence of acute intracranial abnormality. 2. Paranasal sinus disease, as described. Correlate for acute sinusitis. CTA head/neck: 1. Irregularity of the V2 segment of the left vertebral artery with sites of up to mild/moderate stenosis. Irregularity of the V4 left vertebral artery with sites of up to moderate/severe stenosis. These findings are highly suspicious for left vertebral artery dissection given the provided history. A brain MRI is recommended to assess for acute intracranial ischemia. 2. Elsewhere within the head  and neck, there is no large vessel occlusion or proximal high-grade arterial stenosis. 3. Aberrant right subclavian artery. The right subclavian artery origin is incompletely included in the field of view. Electronically Signed   By: Jackey Loge D.O.   On: 09/09/2020 10:36   CT HEAD WO CONTRAST ( )  Result Date: 09/13/2020 CLINICAL DATA:  Headache and right upper extremity numbness. EXAM: CT HEAD WITHOUT CONTRAST TECHNIQUE: Contiguous axial images were obtained from the base of the skull through the vertex without intravenous contrast. COMPARISON:  CT head from yesterday. FINDINGS: Brain: Unchanged subtle hypodensity in the left medulla consistent with known infarct. No hemorrhage by CT. No hydrocephalus, extra-axial collection or mass lesion/mass effect. Vascular: No hyperdense vessel or unexpected calcification. Skull: Normal. Negative for fracture or focal lesion. Sinuses/Orbits: Unchanged mucosal thickening of the ethmoid air cells and right frontal sinus. Other: None. IMPRESSION: 1. Unchanged subtle hypodensity in the left medulla consistent with known infarct. No hemorrhage by CT. Electronically Signed   By: Obie Dredge M.D.   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 EXAM: CT HEAD WITHOUT CONTRAST TECHNIQUE: Contiguous axial images were obtained from the base of the skull through the vertex without intravenous contrast. COMPARISON:  Head CT earlier today.  Brain MRI yesterday. FINDINGS: Brain: The known left medullary infarct is only faintly visualized by CT. The petechial hemorrhage on MRI is not seen. No additional area of acute ischemia. No hemorrhage or hydrocephalus. No midline shift or mass effect. No extra-axial or subdural collection. Vascular: No intracranial hyperdense vessel. Skull: No fracture or focal lesion. Sinuses/Orbits:  Mucosal thickening throughout the ethmoid air cells and right frontal sinus, stable from prior. No mastoid effusion. Other: None. IMPRESSION: 1. The known left medullary infarct is only faintly visualized. The petechial hemorrhage on MRI is not seen by CT. 2. No new abnormality. Electronically Signed   By: Narda Rutherford M.D.   On: 09/12/2020 21:52   CT HEAD WO CONTRAST ( )  Result Date: 09/12/2020 CLINICAL DATA:  Stroke follow-up EXAM: CT HEAD WITHOUT CONTRAST TECHNIQUE: Contiguous axial images were obtained from the base of the skull through the vertex without intravenous contrast. COMPARISON:  Brain MRI 09/11/2020 FINDINGS: Brain: There is no mass, hemorrhage or extra-axial collection. The size and configuration of the ventricles and extra-axial CSF spaces are normal. Faint hypoattenuation at the left medulla  oblongata. Vascular: No abnormal hyperdensity of the major intracranial arteries or dural venous sinuses. No intracranial atherosclerosis. Skull: The visualized skull base, calvarium and extracranial soft tissues are normal. Sinuses/Orbits: No fluid levels or advanced mucosal thickening of the visualized paranasal sinuses. No mastoid or middle ear effusion. The orbits are normal. IMPRESSION: 1. No acute intracranial hemorrhage. 2. Faint hypoattenuation at the left medulla oblongata, compatible with recent infarct. Electronically Signed   By: Deatra RobinsonKevin  Herman M.D.   On: 09/12/2020 02:19   MR BRAIN WO CONTRAST  Result Date: 09/11/2020 CLINICAL DATA:  Neuro deficit, acute, stroke suspected. Left vertebral artery dissection with left lateral medullary stroke and worsening headache. EXAM: MRI HEAD WITHOUT CONTRAST TECHNIQUE: Multiplanar, multiecho pulse sequences of the brain and surrounding structures were obtained without intravenous contrast. COMPARISON:  CT angiography same day.  MRI 09/09/2020. FINDINGS: Brain: Acute infarction of the left medulla appears more extensive compared to the study of 2  days ago. This may simply represent evolutionary change of the infarction or could represent actual extension. Patient now has clear T2 and FLAIR signal abnormality in the region of the infarction. Mild petechial blood products are present based on the SWI imaging. No frank hematoma. Otherwise, the brain appears normal. No cerebellar or occipital lobe stroke. Cerebral hemispheres appear entirely normal. No hydrocephalus or extra-axial collection. Vascular: Major vessels at the base of the brain show flow. Skull and upper cervical spine: Negative Sinuses/Orbits: Continued sinus inflammatory changes, particularly of the right frontal sinus. Other: None IMPRESSION: More conspicuous signal abnormality in the left medulla that could possibly be evolutionary change of the previous stroke, though some extension is not excluded. Petechial blood products in the region without measurable hematoma. Electronically Signed   By: Paulina FusiMark  Shogry M.D.   On: 09/11/2020 18:52   MR BRAIN WO CONTRAST  Result Date: 09/09/2020 CLINICAL DATA:  Neuro deficit, acute, stroke suspected; dizziness and weakness after chiropractor visit; abnormal CTA EXAM: MRI HEAD WITHOUT CONTRAST TECHNIQUE: Multiplanar, multiecho pulse sequences of the brain and surrounding structures were obtained without intravenous contrast. COMPARISON:  None. FINDINGS: Brain: Focal reduced diffusion at the left lateral aspect of the medulla. Ventricles and sulci are normal in size and configuration. A few small foci of T2 hyperintensity in the cerebral white matter likely reflect nonspecific gliosis/demyelination of doubtful clinical significance. There is no evidence of intracranial hemorrhage. There is no intracranial mass, mass effect, or edema. There is no hydrocephalus or extra-axial fluid collection. Vascular: Major vessel flow voids at the skull base are preserved. Skull and upper cervical spine: Normal marrow signal is preserved. Sinuses/Orbits: Paranasal sinuses  are aerated. Orbits are unremarkable. Other: Sella is unremarkable.  Mastoid air cells are clear. IMPRESSION: Small acute stroke of the lateral left medulla. These results were called by telephone at the time of interpretation on 09/09/2020 at 1:11 pm to provider [facility]VIN PADUCHOWSKI , who verbally acknowledged these results. Electronically Signed   By: Guadlupe SpanishPraneil  Patel M.D.   On: 09/09/2020 13:12   MR Cervical Spine Wo Contrast  Result Date: 09/09/2020 CLINICAL DATA:  Cervical radiculopathy caudal right flecks; numbness face and neck after chiropractor adjustment EXAM: MRI CERVICAL SPINE WITHOUT CONTRAST TECHNIQUE: Multiplanar, multisequence MR imaging of the cervical spine was performed. No intravenous contrast was administered. COMPARISON:  None. FINDINGS: Alignment: No significant listhesis. Vertebrae: Vertebral body heights are maintained. No marrow edema. No suspicious osseous lesion Cord: Normal caliber and signal. Posterior Fossa, vertebral arteries, paraspinal tissues: Possible crescentic abnormal signal is present about the left  V2 vertebral artery at the C3-C4 to C5 levels. May reflect subintimal hematoma of dissection seen on CTA. Otherwise unremarkable. Disc levels: Minimal central protrusion at C6-C7. There is no canal or foraminal stenosis at any level. IMPRESSION: No abnormal cord signal.  No canal or foraminal stenosis. Electronically Signed   By: Guadlupe Spanish M.D.   On: 09/09/2020 13:17   DG Chest Port 1 View  Result Date: 09/11/2020 CLINICAL DATA:  Chest pain. EXAM: PORTABLE CHEST 1 VIEW COMPARISON:  None. FINDINGS: Cardiomegaly. No focal consolidation, pleural effusion, or pneumothorax. No acute osseous abnormality. IMPRESSION: 1. Cardiomegaly. No active disease. Electronically Signed   By: Obie Dredge M.D.   On: 09/11/2020 07:22      Today   Subjective    Curtis [name] today has improved left occipital and temporal headache, still has diplopia when he opens both eyes together,  mild numbness in the left arm with minimal weakness in the left arm.   Objective   Blood pressure 133/81, pulse 100, temperature 98.2 F (36.8 C), resp. rate 18, height 6' (1.829 m), weight 122.5 kg, SpO2 97 %.   Intake/Output Summary (Last 24 hours) at 09/13/2020 1018 Last data filed at 09/12/2020 2200 Gross per 24 hour  Intake 60 ml  Output --  Net 60 ml    Exam  Awake Alert, No new F.N deficits, minimal left arm weakness, minimal subjective numbness in the left arm   [place].AT,PERRAL Supple Neck,No JVD, No cervical lymphadenopathy appriciated.  Symmetrical Chest wall movement, Good air movement bilaterally, CTAB RRR,No Gallops,Rubs or new Murmurs, No Parasternal Heave +ve B.Sounds, Abd Soft, No tenderness, No organomegaly appriciated, No rebound - guarding or rigidity. No Cyanosis, Clubbing or edema, No new Rash or bruise      Data Review   CBC w Diff:  Lab Results  Component Value Date   WBC 15.1 (H) 09/11/2020   HGB 16.7 09/11/2020   HCT 48.3 09/11/2020   PLT 280 09/11/2020    CMP:  Lab Results  Component Value Date   NA 139 09/11/2020   K 4.0 09/11/2020   CL 105 09/11/2020   CO2 25 09/11/2020   BUN 14 09/11/2020   CREATININE 0.73 09/11/2020   PROT 7.2 09/10/2020   ALBUMIN 4.0 09/10/2020   BILITOT 1.1 09/10/2020   ALKPHOS 75 09/10/2020   AST 14 (L) 09/10/2020   ALT 13 09/10/2020  . Lab Results  Component Value Date   HGBA1C 5.4 09/10/2020    Lab Results  Component Value Date   CHOL 148 09/10/2020   HDL 63 09/10/2020   LDLCALC 67 09/10/2020   TRIG 90 09/10/2020   CHOLHDL 2.3 09/10/2020     Total Time in preparing paper work, data evaluation and todays exam - 35 minutes  Susa Raring M.D on 09/13/2020 at 10:18 AM  [facility]

## 2020-09-13 NOTE — H&P (Signed)
Physical Medicine and Rehabilitation Admission H&P        Chief Complaint  Patient presents with   Weakness  : HPI: Curtis [name] is a 35 year old right-handed male with history of obesity with BMI 36.62.  Per chart review patient lives with spouse including 46-month-old daughter.  1 level home 3 steps to entry.  Independent prior to admission.  Patient works full-time from home.  Presented to [facility] 09/09/2020 with left-sided numbness, headache dizziness as well as nausea x1 week.  Per report recently presented to chiropractor 2 weeks ago for regular back adjustments.  He denied having any symptoms at that time however he has had some persistent on and off neck pain x3 weeks.  MRI of the brain showed small acute stroke of the lateral left medulla.  CTA head and neck irregularity of the V2 segment of the left vertebral artery with sites of up to mild/moderate stenosis.  Irregularity of the V4 left vertebral artery with sites of up to moderate/severe stenosis.  MRI of the brain follow-up more conspicuous signal abnormality in the left medulla that could possibly be evolutionary change of previous stroke.  CT angiogram head and neck repeated 09/11/2020 showing persistent findings of left vertebral artery dissection extracranially and intracranially.  Slightly increased narrowing at the level of the C2 transverse foramen.  Neurology follow-up currently maintained on aspirin and Plavix for CVA prophylaxis x3 months.  Patient would require repeat vascular imaging in 3 months to determine need to continue DAPT.  Tolerating a regular consistency diet.  Therapy evaluations completed due to patient decreased functional mobility was admitted for a comprehensive rehab program.   Review of Systems  Constitutional:  Negative for chills and fever.  HENT:  Negative for hearing loss.   Eyes:  Negative for blurred vision.  Respiratory:  Negative for cough and shortness of breath.   Cardiovascular:  Negative for  chest pain, palpitations and leg swelling.  Gastrointestinal:  Positive for constipation and nausea. Negative for heartburn.  Genitourinary:  Negative for dysuria, flank pain and hematuria.  Musculoskeletal:  Positive for back pain and neck pain.  Skin:  Negative for rash.  Neurological:  Positive for dizziness and headaches.  All other systems reviewed and are negative. History reviewed. No pertinent past medical history. History reviewed. No pertinent surgical history. History reviewed. No pertinent family history. Social History:  reports that he has been smoking cigars. He has never used smokeless tobacco. He reports current alcohol use of about 2.0 standard drinks per week. He reports that he does not use drugs. Allergies: No Known Allergies       Medications Prior to Admission  Medication Sig Dispense Refill   cyclobenzaprine (FLEXERIL) 5 MG tablet Take 5 mg by mouth 3 (three) times daily as needed.       [EXPIRED] methylPREDNISolone (MEDROL DOSEPAK) 4 MG TBPK tablet Take 1-6 tablets by mouth daily. Take 6 tablets on day 1 as directed on package and decrease by 1 tab each day for a total of 6 days          Drug Regimen Review Drug regimen was reviewed and remains appropriate with no significant issues identified   Home: Home Living Family/patient expects to be discharged to:: Private residence Living Arrangements: Spouse/significant other, Children Available Help at Discharge: Family, Available 24 hours/day Type of Home: House Home Access: Stairs to enter [employer] of Steps: 3 Entrance Stairs-Rails: Left Home Layout: One level Bathroom Shower/Tub: Health visitor: Administrator  Accessibility: Yes Home Equipment: Shower seat - built in  Lives With: Spouse, Daughter   Functional History: Prior Function Level of Independence: Independent Comments: Pt works from home full time, has 38mo daughter. Drives, plays golf   Functional Status:   Mobility: Bed Mobility Overal bed mobility: Modified Independent General bed mobility comments: pt able to perform several log rolls prior to mobility transition. Approx 3/10 dizziness noted with transition to EOB. Once seated assisted in adjusting to upright midline posture as he tends to still present with L lateral lean. Transfers Overall transfer level: Needs assistance Equipment used: Rolling walker (2 wheeled) Transfers: Sit to/from Stand Sit to Stand: Min assist Stand pivot transfers: Min assist General transfer comment: able to stand with cues for hand placement. Once standing assisted with midline posture and safe BOS. Static standing balance improved Ambulation/Gait Ambulation/Gait assistance: Min assist, Mod assist, +2 safety/equipment Gait Distance (Feet): 75 Feet Assistive device: Rolling walker (2 wheeled) Gait Pattern/deviations: Step-to pattern General Gait Details: Effortful gait requiring RW and L lateral lean and several LOB requiring occasional mod assist for correction. With slow gait, balance improves, however with any challenge in speed, LOB noted with decreased ability to correct. Fatigues with increased distance with 1 standing rest break required   ADL: ADL Overall ADL's : Needs assistance/impaired General ADL Comments: Pt completed ADL session wearing eye patch on R eye. MIN A + RW hand washing standing sinkside - pt tolerates ~1 min standing grooming prior to requesting to sit r/t dizziness. Pt toelrated x3 standing trials c MIN A + RW - assist for L lateral lean in standing. Initial MIN A + RW for side steps at EOB decreasing to MOD A + RW for forward/backward steps at EOB.   Cognition: Cognition Overall Cognitive Status: Within Functional Limits for tasks assessed Arousal/Alertness: Awake/alert Orientation Level: Oriented X4 Attention: Focused, Sustained Focused Attention: Appears intact Sustained Attention: Appears intact Memory: Appears  intact Awareness: Appears intact Behaviors:  (n/a) Safety/Judgment: Appears intact Cognition Arousal/Alertness: Awake/alert Behavior During Therapy: WFL for tasks assessed/performed Overall Cognitive Status: Within Functional Limits for tasks assessed General Comments: eyes closed during session due to dizziness/nausea. Rates dizziness at 8/10 increasing to 10/10 with any head movement   Physical Exam: Blood pressure 140/79, pulse 72, temperature 98.3 F (36.8 C), temperature source Oral, resp. rate 18, height 6' (1.829 m), weight 122.5 kg, SpO2 100 %. Physical Exam Constitutional:      General: He is not in acute distress.    Appearance: He is obese. He is not ill-appearing.  HENT:     Right Ear: External ear normal.     Left Ear: External ear normal.     Nose: Nose normal.     Mouth/Throat:     Mouth: Mucous membranes are moist.     Pharynx: Oropharynx is clear.  Eyes:     Conjunctiva/sclera: Conjunctivae normal.     Pupils: Pupils are equal, round, and reactive to light.  Cardiovascular:     Rate and Rhythm: Normal rate and regular rhythm.     Heart sounds: No murmur heard.   No gallop.  Pulmonary:     Effort: Pulmonary effort is normal. No respiratory distress.     Breath sounds: No wheezing or rales.  Abdominal:     General: Bowel sounds are normal. There is no distension.     Tenderness: There is no abdominal tenderness.  Musculoskeletal:        General: No swelling or tenderness. Normal range of  motion.     Cervical back: Normal range of motion.  Skin:    General: Skin is warm and dry.  Neurological:     Comments: Patient is alert.  Wearing eye patch.  Oriented x3 and follows commands. Normal insight and awareness. Normal language and speech. Functional memory. CN notable for diplopia with tracking to all fields, 2-3 beats of nystagmus with left lateral gaze. Does past point L>R without eye patch. Eyes move easily in all directions however. Mild perioral sensory loss  as well as decreased LT LUE and decreased LT/PP RUE. No focal sensory deficits in legs. Motor essentially 5/5 in UE's and LE's. +PD on left, mild ataxia L>R. DTR's 1+  Psychiatric:        Mood and Affect: Mood normal.        Behavior: Behavior normal.        Thought Content: Thought content normal.        Judgment: Judgment normal.      Lab Results Last 48 Hours  No results found for this or any previous visit (from the past 48 hour(s)).      Imaging Results (Last 48 hours)  CT ANGIO HEAD NECK W WO CM   Result Date: 09/11/2020 CLINICAL DATA:  Left vertebral dissection and left brainstem stroke with worsening headache, left arm weakness EXAM: CT ANGIOGRAPHY HEAD AND NECK TECHNIQUE: Multidetector CT imaging of the head and neck was performed using the standard protocol during bolus administration of intravenous contrast. Multiplanar CT image reconstructions and MIPs were obtained to evaluate the vascular anatomy. Carotid stenosis measurements (when applicable) are obtained utilizing NASCET criteria, using the distal internal carotid diameter as the denominator. CONTRAST:  23mL OMNIPAQUE IOHEXOL 350 MG/ML SOLN COMPARISON:  CTA and MRI 09/09/2020 FINDINGS: CT HEAD Brain: There is no acute intracranial hemorrhage, mass effect, or edema. Gray-white differentiation is preserved. Small infarct of the lateral left medulla is better seen on the MRI. There is no extra-axial fluid collection. Ventricles and sulci are within normal limits in size and configuration. Vascular: No new findings. Skull: Calvarium is unremarkable. Sinuses/Orbits: No acute finding. Other: None. Review of the MIP images confirms the above findings CTA NECK Aortic arch: Patent great vessel origins. Aberrant origin of the right subclavian artery. Right carotid system: Patent. No stenosis or evidence of dissection. Left carotid system: Patent.  No stenosis or evidence of dissection. Vertebral arteries: Patent and codominant. As before,  there is irregularity and narrowing of the left V2 vertebral artery, for example from C3 to C5. There is an additional area narrowing within the left C2 transverse foramen (series 11, image 197) and this appears slightly increased. Stenosis is moderate. Skeleton: No new finding. Other neck: No new finding. Upper chest: No new finding. Review of the MIP images confirms the above findings CTA HEAD Anterior circulation: Intracranial internal carotid arteries are patent. Anterior and middle cerebral arteries are patent. Posterior circulation: Intracranial right vertebral artery is patent. Intracranial left vertebral artery is patent with persistent multifocal narrowing, including focal severe stenosis. Appearance is similar to the prior study. Basilar artery is patent. Bilateral AICA and SCA origins are patent. A PICA is not identified on either side. Posterior cerebral arteries are patent. Venous sinuses: Patent as allowed by contrast bolus timing. Review of the MIP images confirms the above findings IMPRESSION: No acute intracranial abnormality. Small infarct of medulla is better seen on prior MRI. Persistent findings of left vertebral artery dissection extracranially and intracranially. Slightly increased narrowing at the level  of the C2 transverse foramen. Remains up to severe stenosis intracranially. Electronically Signed   By: Guadlupe SpanishPraneil  Patel M.D.   On: 09/11/2020 15:03    CT HEAD WO CONTRAST (5MM)   Result Date: 09/13/2020 CLINICAL DATA:  Headache and right upper extremity numbness. EXAM: CT HEAD WITHOUT CONTRAST TECHNIQUE: Contiguous axial images were obtained from the base of the skull through the vertex without intravenous contrast. COMPARISON:  CT head from yesterday. FINDINGS: Brain: Unchanged subtle hypodensity in the left medulla consistent with known infarct. No hemorrhage by CT. No hydrocephalus, extra-axial collection or mass lesion/mass effect. Vascular: No hyperdense vessel or unexpected  calcification. Skull: Normal. Negative for fracture or focal lesion. Sinuses/Orbits: Unchanged mucosal thickening of the ethmoid air cells and right frontal sinus. Other: None. IMPRESSION: 1. Unchanged subtle hypodensity in the left medulla consistent with known infarct. No hemorrhage by CT. Electronically Signed   By: Obie DredgeWilliam T Derry M.D.   On: 09/13/2020 10:08    CT HEAD WO CONTRAST (5MM)   Result Date: 09/12/2020 CLINICAL DATA:  Neuro deficit, acute, stroke suspected Recent left medulla infarct due to left vertebral artery dissection. EXAM: CT HEAD WITHOUT CONTRAST TECHNIQUE: Contiguous axial images were obtained from the base of the skull through the vertex without intravenous contrast. COMPARISON:  Head CT earlier today.  Brain MRI yesterday. FINDINGS: Brain: The known left medullary infarct is only faintly visualized by CT. The petechial hemorrhage on MRI is not seen. No additional area of acute ischemia. No hemorrhage or hydrocephalus. No midline shift or mass effect. No extra-axial or subdural collection. Vascular: No intracranial hyperdense vessel. Skull: No fracture or focal lesion. Sinuses/Orbits: Mucosal thickening throughout the ethmoid air cells and right frontal sinus, stable from prior. No mastoid effusion. Other: None. IMPRESSION: 1. The known left medullary infarct is only faintly visualized. The petechial hemorrhage on MRI is not seen by CT. 2. No new abnormality. Electronically Signed   By: Narda RutherfordMelanie  Sanford M.D.   On: 09/12/2020 21:52    CT HEAD WO CONTRAST (5MM)   Result Date: 09/12/2020 CLINICAL DATA:  Stroke follow-up EXAM: CT HEAD WITHOUT CONTRAST TECHNIQUE: Contiguous axial images were obtained from the base of the skull through the vertex without intravenous contrast. COMPARISON:  Brain MRI 09/11/2020 FINDINGS: Brain: There is no mass, hemorrhage or extra-axial collection. The size and configuration of the ventricles and extra-axial CSF spaces are normal. Faint hypoattenuation at  the left medulla oblongata. Vascular: No abnormal hyperdensity of the major intracranial arteries or dural venous sinuses. No intracranial atherosclerosis. Skull: The visualized skull base, calvarium and extracranial soft tissues are normal. Sinuses/Orbits: No fluid levels or advanced mucosal thickening of the visualized paranasal sinuses. No mastoid or middle ear effusion. The orbits are normal. IMPRESSION: 1. No acute intracranial hemorrhage. 2. Faint hypoattenuation at the left medulla oblongata, compatible with recent infarct. Electronically Signed   By: Deatra RobinsonKevin  Herman M.D.   On: 09/12/2020 02:19    MR BRAIN WO CONTRAST   Result Date: 09/11/2020 CLINICAL DATA:  Neuro deficit, acute, stroke suspected. Left vertebral artery dissection with left lateral medullary stroke and worsening headache. EXAM: MRI HEAD WITHOUT CONTRAST TECHNIQUE: Multiplanar, multiecho pulse sequences of the brain and surrounding structures were obtained without intravenous contrast. COMPARISON:  CT angiography same day.  MRI 09/09/2020. FINDINGS: Brain: Acute infarction of the left medulla appears more extensive compared to the study of 2 days ago. This may simply represent evolutionary change of the infarction or could represent actual extension. Patient now has clear T2 and  FLAIR signal abnormality in the region of the infarction. Mild petechial blood products are present based on the SWI imaging. No frank hematoma. Otherwise, the brain appears normal. No cerebellar or occipital lobe stroke. Cerebral hemispheres appear entirely normal. No hydrocephalus or extra-axial collection. Vascular: Major vessels at the base of the brain show flow. Skull and upper cervical spine: Negative Sinuses/Orbits: Continued sinus inflammatory changes, particularly of the right frontal sinus. Other: None IMPRESSION: More conspicuous signal abnormality in the left medulla that could possibly be evolutionary change of the previous stroke, though some extension  is not excluded. Petechial blood products in the region without measurable hematoma. Electronically Signed   By: Paulina Fusi M.D.   On: 09/11/2020 18:52             Medical Problem List and Plan: 1.  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  Antithrombotics: -DVT/anticoagulation: SCDs               -antiplatelet therapy: Aspirin 81 mg daily and Plavix 75 mg daily x3 months then repeat vascular imaging to determine need to continue DAPT 3. Pain Management: Neurontin 300 mg 3 times daily, Tylenol 3 as needed             -tylenol has generally been effective for intermittent headaches 4. Mood: Provide emotional support             -antipsychotic agents: N/A 5. Neuropsych: This patient is capable of making decisions on his own behalf. 6. Skin/Wound Care: Routine skin checks 7. Fluids/Electrolytes/Nutrition: Routine in and outs with follow-up chemistries on admit 8.  Obesity.  BMI 36.62.  Dietary changes shall be discussed           Charlton Amor, PA-C 09/13/2020

## 2020-09-13 NOTE — Progress Notes (Signed)
Inpatient Rehabilitation Admissions Coordinator   I have insurance approval for CIR admit today. Dr Riley Kill will be receiving MD and admit to room [address] 07. I spoke with patient by phone and he is in agreement .I will  alert acute team and TOC to make the arrangements to admit today.  Ottie Glazier, RN, MSN Rehab Admissions Coordinator [phone] 09/13/2020 10:46 AM

## 2020-09-13 NOTE — Progress Notes (Signed)
Called report to CIR, talked to [place]. Assisted patient with gathering all personal items. He did complain of headache and frontal head pain. He did receive tylenol #3 at 0806. Did notify Dr. Thedore Mins, no new orders at this time. Contacted by carelink for report and patient status. Family at bedside, awaiting transportation.

## 2020-09-13 NOTE — Progress Notes (Signed)
[name], MD   Physician  Physical Medicine and Rehabilitation  PMR Pre-admission     Signed  Date of Service:  09/11/2020  4:55 PM       Related encounter: ED to Hosp-Admission (Discharged) from 09/09/2020 in [facility] (1C)       Signed      Show:Clear all [x] Written[x] Templated[x] Copied  Added by: [x] , RN[x] , MD  [] Hover for details                                                                                                                                                                                                                                                                                                                                                                                                                                                                PMR Admission Coordinator Pre-Admission Assessment   Patient: Curtis [name] is an 35 y.o., male MRN: DOB: [birth year] Height: 6' (182.9 cm) Weight: 122.5 kg Insurance Information HMO:     PPO: yes     PCP:      IPA:      80/20:      OTHER:  PRIMARY: [employer] [place]      Policy#: [id number]  Subscriber: pt CM Name: Morrie Sheldon      Phone#: [phone] option [phone] ext [phone]     Fax#: [phone] Pre-Cert#: [id number]  Employer: 8/[employer] Benefits:  Phone #: [phone]  Name: 8/17 Eff. Date: 01/27/2020     Deduct: $200      Out of Pocket Max: $2400      Life Max: none CIR: $500 co pay per admit      SNF: 100% 100 days Outpatient: $25 per visits     Co-Pay: per medical neccesity Home Health: 100%      Co-Pay: per medical neccesity DME: 100%     Co-Pay: none Providers: in network SECONDARY: none   Financial Counselor:       Phone#:    The Engineer, materials Information Summary" for  patients in Inpatient Rehabilitation Facilities with attached "Privacy Act Statement-Health Care Records" was provided and verbally reviewed with: N/A   Emergency Contact Information Contact Information       Name Relation Home Work Mobile    Masek,[name] Spouse     [phone]           Current Medical History  Patient Admitting Diagnosis: CVA   History of Present Illness:  35 year old right-handed male with history of obesity with BMI 36.62.  Per chart review patient lives with spouse including 34-month-old daughter.  1 level home 3 steps to entry.  Independent prior to admission.  Patient works full-time from home.  Presented to [facility] 09/09/2020 with left-sided numbness, headache dizziness as well as nausea x1 week.  Per report recently presented to chiropractor 2 weeks ago for regular back adjustments.  He denied having any symptoms at that time however he has had some persistent on and off neck pain x3 weeks.  MRI of the brain showed small acute stroke of the lateral left medulla.  CTA head and neck irregularity of the V2 segment of the left vertebral artery with sites of up to mild/moderate stenosis.  Irregularity of the V4 left vertebral artery with sites of up to moderate/severe stenosis.  MRI of the brain follow-up more conspicuous signal abnormality in the left medulla that could possibly be evolutionary change of previous stroke.  CT angiogram head and neck repeated 09/11/2020 showing persistent findings of left vertebral artery dissection extracranially and intracranially.  Slightly increased narrowing at the level of the C2 transverse foramen.  Neurology follow-up currently maintained on aspirin and Plavix for CVA prophylaxis x3 months.  Patient would require repeat vascular imaging in 3 months to determine need to continue DAPT.  Tolerating a regular consistency diet   Complete NIHSS TOTAL: 2   Patient's medical record from [facility] has been reviewed by the rehabilitation admission  coordinator and physician.   Past Medical History  History reviewed. No pertinent past medical history.   Family History   family history is not on file.   Prior Rehab/Hospitalizations Has the patient had prior rehab or hospitalizations prior to admission? Yes   Has the patient had major surgery during 100 days prior to admission? No               Current Medications   Current Facility-Administered Medications:    0.9 %  sodium chloride infusion, , Intravenous, PRN, Leroy Sea, MD, Last Rate: 10 mL/hr at 09/12/20 0640, 10 mL at 09/12/20 0640   acetaminophen-codeine (TYLENOL #3) 300-30 MG per tablet 1 tablet, 1 tablet, Oral, Q6H PRN, Leroy Sea, MD, 1 tablet at 09/13/20 0806   alum & mag hydroxide-simeth (  MAALOX/MYLANTA) 200-200-20 MG/5ML suspension 30 mL, 30 mL, Oral, Q4H PRN, Mansy, Jan A, MD, 30 mL at 09/12/20 0017   [COMPLETED] aspirin chewable tablet 325 mg, 325 mg, Oral, Once, 325 mg at 09/09/20 1127 **FOLLOWED BY** aspirin EC tablet 81 mg, 81 mg, Oral, Daily, Cox, Amy N, DO, 81 mg at 09/13/20 0806   calcium carbonate (TUMS - dosed in mg elemental calcium) chewable tablet 200 mg of elemental calcium, 1 tablet, Oral, TID PRN, Alberteen Sam, MD, 200 mg of elemental calcium at 09/12/20 0224   chlorproMAZINE (THORAZINE) tablet 25 mg, 25 mg, Oral, QID PRN, Mansy, Jan A, MD, 25 mg at 09/12/20 1638   [COMPLETED] clopidogrel (PLAVIX) tablet 300 mg, 300 mg, Oral, Once, 300 mg at 09/09/20 1609 **FOLLOWED BY** clopidogrel (PLAVIX) tablet 75 mg, 75 mg, Oral, Daily, Cox, Amy N, DO, 75 mg at 09/13/20 0806   gabapentin (NEURONTIN) capsule 300 mg, 300 mg, Oral, TID, Jefferson Fuel, MD, 300 mg at 09/13/20 2248   meclizine (ANTIVERT) tablet 50 mg, 50 mg, Oral, TID, Leroy Sea, MD, 50 mg at 09/13/20 0806   ondansetron (ZOFRAN) injection 4 mg, 4 mg, Intravenous, Q6H PRN, Leroy Sea, MD   pantoprazole (PROTONIX) EC tablet 40 mg, 40 mg, Oral, BID, Cox, Amy N, DO, 40 mg  at 09/13/20 0806   phenol (CHLORASEPTIC) mouth spray 1 spray, 1 spray, Mouth/Throat, PRN, Leroy Sea, MD, 1 spray at 09/12/20 0931   promethazine (PHENERGAN) 25 mg in sodium chloride 0.9 % 50 mL IVPB, 25 mg, Intravenous, Q8H PRN, Danford, Earl Lites, MD, Last Rate: 200 mL/hr at 09/12/20 0642, 25 mg at 09/12/20 2500   senna-docusate (Senokot-S) tablet 1 tablet, 1 tablet, Oral, QHS PRN, Cox, Amy N, DO   Patients Current Diet:  Diet Order                  Diet - low sodium heart healthy             Diet Heart Room service appropriate? Yes; Fluid consistency: Thin  Diet effective now                       Precautions / Restrictions Precautions Precautions: Fall Restrictions Weight Bearing Restrictions: No    Has the patient had 2 or more falls or a fall with injury in the past year? No   Prior Activity Level Community (5-7x/wk): Independent   Prior Functional Level Self Care: Did the patient need help bathing, dressing, using the toilet or eating? Independent   Indoor Mobility: Did the patient need assistance with walking from room to room (with or without device)? Independent   Stairs: Did the patient need assistance with internal or external stairs (with or without device)? Independent   Functional Cognition: Did the patient need help planning regular tasks such as shopping or remembering to take medications? Independent   Home Assistive Devices / Equipment Home Assistive Devices/Equipment: None Home Equipment: Shower seat - built in   Prior Device Use: Indicate devices/aids used by the patient prior to current illness, exacerbation or injury? None of the above   Current Functional Level Cognition   Arousal/Alertness: Awake/alert Overall Cognitive Status: Within Functional Limits for tasks assessed Orientation Level: Oriented X4 General Comments: eyes closed during session due to dizziness/nausea. Rates dizziness at 8/10 increasing to 10/10 with any head  movement Attention: Focused, Sustained Focused Attention: Appears intact Sustained Attention: Appears intact Memory: Appears intact Awareness: Appears intact Behaviors:  (  n/a) Safety/Judgment: Appears intact    Extremity Assessment (includes Sensation/Coordination)   Upper Extremity Assessment: LUE deficits/detail (R UE grossly 5/5) RUE Deficits / Details: Pt endorses tingling to hands/forearm. LUE Deficits / Details: +pronator drift. Good RAMP. Sensation intact, however endorses tingling  Lower Extremity Assessment: Overall WFL for tasks assessed (endorse tingling in L LE and weakness, although apparantly feels weakness with functional mobility)     ADLs   Overall ADL's : Needs assistance/impaired General ADL Comments: Pt completed ADL session wearing eye patch on R eye. MIN A + RW hand washing standing sinkside - pt tolerates ~1 min standing grooming prior to requesting to sit r/t dizziness. Pt toelrated x3 standing trials c MIN A + RW - assist for L lateral lean in standing. Initial MIN A + RW for side steps at EOB decreasing to MOD A + RW for forward/backward steps at EOB.     Mobility   Overal bed mobility: Modified Independent General bed mobility comments: pt able to perform several log rolls prior to mobility transition. Approx 3/10 dizziness noted with transition to EOB. Once seated assisted in adjusting to upright midline posture as he tends to still present with L lateral lean.     Transfers   Overall transfer level: Needs assistance Equipment used: Rolling walker (2 wheeled) Transfers: Sit to/from Stand Sit to Stand: Min assist Stand pivot transfers: Min assist General transfer comment: able to stand with cues for hand placement. Once standing assisted with midline posture and safe BOS. Static standing balance improved     Ambulation / Gait / Stairs / Wheelchair Mobility   Ambulation/Gait Ambulation/Gait assistance: Min assist, Mod assist, +2 safety/equipment Gait  Distance (Feet): 75 Feet Assistive device: Rolling walker (2 wheeled) Gait Pattern/deviations: Step-to pattern General Gait Details: Effortful gait requiring RW and L lateral lean and several LOB requiring occasional mod assist for correction. With slow gait, balance improves, however with any challenge in speed, LOB noted with decreased ability to correct. Fatigues with increased distance with 1 standing rest break required     Posture / Balance Dynamic Sitting Balance Sitting balance - Comments: cues for upright midline posture Balance Overall balance assessment: Needs assistance Sitting-balance support: No upper extremity supported, Feet supported Sitting balance-Leahy Scale: Fair Sitting balance - Comments: cues for upright midline posture Postural control: Left lateral lean Standing balance support: Bilateral upper extremity supported Standing balance-Leahy Scale: Fair Standing balance comment: zero for dynamic standing     Special needs/care consideration      Previous Home Environment  Living Arrangements: Spouse/significant other, Children  Lives With: Spouse, Daughter Available Help at Discharge: Family, Available 24 hours/day Type of Home: House Home Layout: One level Home Access: Stairs to enter Entrance Stairs-Rails: Left Entrance Stairs-Number of Steps: 3 Bathroom Shower/Tub: Health visitorWalk-in shower Bathroom Toilet: Standard Bathroom Accessibility: Yes How Accessible: Accessible via walker Home Care Services: No   Discharge Living Setting Plans for Discharge Living Setting: Patient's home, Lives with (comment) (wife and 442 month old daughter) Type of Home at Discharge: House Discharge Home Layout: One level Discharge Home Access: Stairs to enter Entrance Stairs-Rails: Left Entrance Stairs-Number of Steps: 3 Discharge Bathroom Shower/Tub: Walk-in shower Discharge Bathroom Toilet: Standard Discharge Bathroom Accessibility: Yes How Accessible: Accessible via walker Does  the patient have any problems obtaining your medications?: No   Social/Family/Support Systems Patient Roles: Spouse, Parent (employee) Contact Information: wife, [name] Anticipated Caregiver: wife Anticipated Caregiver's Contact Information: see above Ability/Limitations of Caregiver: wife with 592 month old child Caregiver  Availability: 24/7 Discharge Plan Discussed with Primary Caregiver: Yes Is Caregiver In Agreement with Plan?: Yes Does Caregiver/Family have Issues with Lodging/Transportation while Pt is in Rehab?: No   Goals Patient/Family Goal for Rehab: supervision with PT and OT Expected length of stay: ELOS 2 to 3 weeks Pt/Family Agrees to Admission and willing to participate: Yes Program Orientation Provided & Reviewed with Pt/Caregiver Including Roles  & Responsibilities: Yes   Decrease burden of Care through IP rehab admission: n/a   Possible need for SNF placement upon discharge: not anticipated   Patient Condition: I have reviewed medical records from [facility], spoken with CM, and patient and spouse. I discussed via phone for inpatient rehabilitation assessment.  Patient will benefit from ongoing PT and OT, can actively participate in 3 hours of therapy a day 5 days of the week, and can make measurable gains during the admission.  Patient will also benefit from the coordinated team approach during an Inpatient Acute Rehabilitation admission.  The patient will receive intensive therapy as well as Rehabilitation physician, nursing, social worker, and care management interventions.  Due to bladder management, bowel management, safety, skin/wound care, disease management, medication administration, pain management, and patient education the patient requires 24 hour a day rehabilitation nursing.  The patient is currently min to mod asisst with mobility and basic ADLs.  Discharge setting and therapy post discharge at home with outpatient is anticipated.  Patient has agreed to participate in  the Acute Inpatient Rehabilitation Program and will admit today.   Preadmission Screen Completed By:  Clois Dupes, 09/13/2020 10:53 AM ______________________________________________________________________   Discussed status with Dr. Riley Kill on  09/13/2020 at 1053 and received approval for admission today.   Admission Coordinator:  Clois Dupes, RN, time [id number] Date  09/13/2020    Assessment/Plan: Diagnosis: left lateral medullar infarct Does the need for close, 24 hr/day Medical supervision in concert with the patient's rehab needs make it unreasonable for this patient to be served in a less intensive setting? Yes Co-Morbidities requiring supervision/potential complications: obesity, post-stroke sequelae Due to bladder management, bowel management, safety, skin/wound care, disease management, medication administration, pain management, and patient education, does the patient require 24 hr/day rehab nursing? Yes Does the patient require coordinated care of a physician, rehab nurse, PT, OT to address physical and functional deficits in the context of the above medical diagnosis(es)? Yes Addressing deficits in the following areas: balance, endurance, locomotion, strength, transferring, bowel/bladder control, bathing, dressing, feeding, grooming, toileting, and psychosocial support Can the patient actively participate in an intensive therapy program of at least 3 hrs of therapy 5 days a week? Yes The potential for patient to make measurable gains while on inpatient rehab is excellent Anticipated functional outcomes upon discharge from inpatient rehab: supervision PT, supervision OT, n/a SLP Estimated rehab length of stay to reach the above functional goals is: 2-3 weeks Anticipated discharge destination: Home 10. Overall Rehab/Functional Prognosis: excellent     MD Signature: [name], MD, [facility] Physical Medicine & Rehabilitation 09/13/2020           Revision History                          Note Details  Author [name], MD File Time 09/13/2020 10:59 AM  Author Type Physician Status Signed  Last Editor [name], MD Service Physical Medicine and Rehabilitation  Hospital Acct # [id number] Admit Date 09/13/2020

## 2020-09-13 NOTE — Progress Notes (Signed)
Patient ID: Curtis [name], male   DOB: September 21, 1985, 35 y.o.   MRN: [id number] Met with the patient's wife as patient was sleeping to introduce self and the role of the nurse CM. Reviewed rehab schedule and plan of care. Continue to follow along to discharge to address educational needs and secondary risk management as well as collaborate with the SW to facilitate preparation for discharge. Margarito Liner

## 2020-09-13 NOTE — Progress Notes (Signed)
Inpatient Rehabilitation  Patient information reviewed and entered into eRehab system by Blaiden Werth M. Zayin Valadez, M.A., CCC/SLP, PPS Coordinator.  Information including medical coding, functional ability and quality indicators will be reviewed and updated through discharge.    

## 2020-09-13 NOTE — Evaluation (Signed)
Occupational Therapy Assessment and Plan  Patient Details  Name: Curtis [name] MRN: [id number] Date of Birth: Apr 20, 1985  OT Diagnosis: abnormal posture, ataxia, disturbance of vision, and muscle weakness (generalized) Rehab Potential: Rehab Potential (ACUTE ONLY): Excellent ELOS: 2-3 weeks   Today's Date: 09/14/2020 OT Individual Time: 1000-1058 and  OT Individual Time Calculation (min): 58 min  and 17 min 28 minutes missed  Hospital Problem: Principal Problem:   Lateral medullary syndrome   Past Medical History: History reviewed. No pertinent past medical history. Past Surgical History: History reviewed. No pertinent surgical history.  Assessment & Plan Clinical Impression: Habeeb Puertas. Maull is a 35 year old right-handed male with history of obesity with BMI 36.62.  Per chart review patient lives with spouse including 68-monthold daughter.  1 level home 3 steps to entry.  Independent prior to admission.  Patient works full-time from home.  [facility] with left-sided numbness, headache dizziness as well as nausea x1 week.  Per report recently presented to chiropractor 2 weeks ago for regular back adjustments.  He denied having any symptoms at that time however he has had some persistent on and off neck pain x3 weeks.  MRI of the brain showed small acute stroke of the lateral left medulla.  CTA head and neck irregularity of the V2 segment of the left vertebral artery with sites of up to mild/moderate stenosis.  Irregularity of the V4 left vertebral artery with sites of up to moderate/severe stenosis.  MRI of the brain follow-up more conspicuous signal abnormality in the left medulla that could possibly be evolutionary change of previous stroke.  CT angiogram head and neck repeated 09/11/2020 showing persistent findings of left vertebral artery dissection extracranially and intracranially.  Slightly increased narrowing at the level of the C2 transverse foramen.  Neurology  follow-up currently maintained on aspirin and Plavix for CVA prophylaxis x3 months.  Patient would require repeat vascular imaging in 3 months to determine need to continue DAPT.  Tolerating a regular consistency diet.  Therapy evaluations completed due to patient decreased functional mobility was admitted for a comprehensive rehab program.  Patient currently requires min-mod with basic self-care skills secondary to muscle weakness, decreased cardiorespiratoy endurance, ataxia and decreased coordination, diplopia, and decreased standing balance, decreased postural control, and decreased balance strategies.  Prior to hospitalization, patient could complete BADLs with independent .  Patient will benefit from skilled intervention to increase level of independence with iADL prior to discharge  home with wife .  Anticipate patient will require 24 hour supervision and follow up home health.  OT - End of Session Endurance Deficit: Yes Endurance Deficit Description: impacted by dizziness/nausea > fatigue OT Assessment Rehab Potential (ACUTE ONLY): Excellent OT Barriers to Discharge: Lack of/limited family support;Weight (limited support as wife has newborn to care for) OT Patient demonstrates impairments in the following area(s): Balance;Endurance;Motor;Safety;Vision OT Basic ADL's Functional Problem(s): Grooming;Bathing;Dressing;Toileting OT Advanced ADL's Functional Problem(s): Simple Meal Preparation OT Transfers Functional Problem(s): Toilet;Tub/Shower OT Additional Impairment(s): Fuctional Use of Upper Extremity (pt reports still having difficultly with opening small containers/packages due to Lt coordination deficits) OT Plan OT Intensity: Minimum of 1-2 x/day, 45 to 90 minutes OT Frequency: 5 out of 7 days OT Duration/Estimated Length of Stay: 2-3 weeks OT Treatment/Interventions: DME/adaptive equipment instruction;Balance/vestibular training;Patient/family education;Therapeutic  Activities;Wheelchair propulsion/positioning;Psychosocial support;Therapeutic Exercise;UE/LE Strength taining/ROM;Self Care/advanced ADL retraining;Functional mobility training;Community reintegration;Discharge planning;Neuromuscular re-education;UE/LE Coordination activities;Visual/perceptual remediation/compensation;Pain management;Disease mangement/prevention OT Self Feeding Anticipated Outcome(s): No goal OT Basic Self-Care Anticipated Outcome(s): Supervision-Mod I OT Toileting Anticipated Outcome(s):  Mod I OT Bathroom Transfers Anticipated Outcome(s): Supervision/setup-Mod I OT Recommendation Recommendations for Other Services: Therapeutic Recreation consult Therapeutic Recreation Interventions: Kitchen group;Outing/community reintergration Patient destination: Home Follow Up Recommendations: Home health OT Equipment Recommended: To be determined   OT Evaluation Precautions/Restrictions  Precautions Precautions: Fall Precaution Comments: [place] Syndrome. Nausea/dizziness, diplopia, rotating eye patch Restrictions Weight Bearing Restrictions: No  Home Living/Prior Functioning Home Living Available Help at Discharge: Family, Available 24 hours/day, Other (Comment) (wife on maternity leave until October) Type of Home: House Home Access: Stairs to enter [employer] of Steps: 3 Entrance Stairs-Rails: Left Home Layout: One level Bathroom Shower/Tub: Gaffer, Other (comment) (build in Astronomer) Bathroom Toilet: Standard Bathroom Accessibility: Yes  Lives With: Spouse, Daughter (2 m/o daughter) IADL History Homemaking Responsibilities: Yes (Pt would cook and help out around the house PTA, also drove) Occupation: Full time employment Type of Occupation: Museum/gallery curator, working from home Leisure and Hobbies: golfing Prior Function Level of Independence: Independent with gait, Independent with transfers, Independent with homemaking with ambulation, Independent  with basic ADLs  Able to Take Stairs?: Yes Driving: Yes Vocation: Full time employment 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 Vision Baseline Vision/History: Wears glasses Wears Glasses: At all times Patient Visual Report: Diplopia;Blurring of vision;Nausea/blurring vision with head movement Vision Assessment?: Yes Eye Alignment: Within Functional Limits Ocular Range of Motion: Within Functional Limits Tracking/Visual Pursuits: Able to track stimulus in all quads without difficulty Saccades: Impaired - to be further tested in functional context Convergence: Within functional limits Diplopia Assessment: Objects split on top of one another Praxis Praxis: Intact Cognition Overall Cognitive Status: Within Functional Limits for tasks assessed Arousal/Alertness: Awake/alert Orientation Level: Person;Place;Situation Person: Oriented Place: Oriented Situation: Oriented Year: 2022 Month: August Day of Week: Correct Memory: Appears intact Immediate Memory Recall: Sock;Blue;Bed Memory Recall Sock: Without Cue Memory Recall Blue: Without Cue Memory Recall Bed: Without Cue Attention: Focused;Sustained Focused Attention: Appears intact Sustained Attention: Appears intact Awareness: Appears intact Problem Solving: Appears intact Safety/Judgment: Appears intact Sensation Coordination Gross Motor Movements are Fluid and Coordinated: No Fine Motor Movements are Fluid and Coordinated: No Coordination and Movement Description: Lt sided ataxia Finger Nose Finger Test: ataxic on the Lt, Rt WNL Motor  Motor Motor: Ataxia;Abnormal postural alignment and control  Trunk/Postural Assessment  Cervical Assessment Cervical Assessment: Within Functional Limits Thoracic Assessment Thoracic Assessment: Within Functional Limits Lumbar Assessment Lumbar Assessment: Within Functional Limits Postural Control Postural Control: Deficits on evaluation (decreased in standing,  needs AD for dynamic standing balance during ADL tasks + functional transfers) Righting Reactions: delayed  Balance Balance Balance Assessed: Yes Static Sitting Balance Static Sitting - Balance Support: Feet supported Static Sitting - Level of Assistance: 5: Stand by assistance Dynamic Sitting Balance Dynamic Sitting - Balance Support: Feet supported Dynamic Sitting - Level of Assistance: 5: Stand by assistance (donning overhead shirt) Static Standing Balance Static Standing - Balance Support: No upper extremity supported Static Standing - Level of Assistance: 4: Min assist Dynamic Standing Balance Dynamic Standing - Balance Support: During functional activity Dynamic Standing - Level of Assistance: 3: Mod assist;4: Min assist Dynamic Standing - Balance Activities: Forward lean/weight shifting;Lateral lean/weight shifting (ambulatory toilet transfer using RW) Extremity/Trunk Assessment RUE Assessment Active Range of Motion (AROM) Comments: WNL General Strength Comments: 4+/5 grossly LUE Assessment LUE Assessment: Exceptions to [facility] (mild ataxia) Active Range of Motion (AROM) Comments: WNL General Strength Comments: 4+/5 grossly  Care Tool Care Tool Self Care Eating    Not assessed  Oral Care    Oral Care Assist Level: Set up assist (simulated)    Bathing   Body parts bathed by patient: Right arm;Left arm;Chest;Abdomen;Front perineal area;Buttocks;Right upper leg;Left upper leg;Right lower leg;Left lower leg;Face     Assist Level: Minimal Assistance - Patient > 75%    Upper Body Dressing(including orthotics)   What is the patient wearing?: Pull over shirt   Assist Level: Set up assist    Lower Body Dressing (excluding footwear)   What is the patient wearing?: Underwear/pull up;Pants Assist for lower body dressing: Minimal Assistance - Patient > 75%    Putting on/Taking off footwear   What is the patient wearing?: Non-skid slipper socks Assist for footwear:  Supervision/Verbal cueing       Care Tool Toileting Toileting activity Toileting Activity did not occur (Clothing management and hygiene only): N/A (no void or bm)       Care Tool Bed Mobility Roll left and right activity   Roll left and right assist level: Supervision/Verbal cueing    Sit to lying activity   Sit to lying assist level: Supervision/Verbal cueing    Lying to sitting edge of bed activity   Lying to sitting edge of bed assist level: Supervision/Verbal cueing     Care Tool Transfers Sit to stand transfer   Sit to stand assist level: Minimal Assistance - Patient > 75%    Chair/bed transfer   Chair/bed transfer assist level: Minimal Assistance - Patient > 75%     Toilet transfer   Assist Level: Moderate Assistance - Patient 50 - 74%      Refer to Care Plan for Long Term Goals  SHORT TERM GOAL WEEK 1 OT Short Term Goal 1 (Week 1): Pt will complete an ambulatory toilet transfer using RW with no more than Min A OT Short Term Goal 2 (Week 1): Pt will complete LB dressing at sit<stand level with no more than CGA OT Short Term Goal 3 (Week 1): Pt will complete 2 grooming tasks while standing at the sink while independently utilizing 2 compensatory strategies to manage symptoms of dizzyness/nausea  Recommendations for other services: Surveyor, mining group and Outing/community reintegration   Skilled Therapeutic Intervention Skilled OT session completed with focus on initial evaluation, education on OT role/POC, and establishment of patient-centered goals.   Pt greeted in bed. Wife at bedside. Pt finishing up with RN and had just received his morning medicine including antinausea medicine. Pt reported having no pain, just nauseated/dizzy due to room "spinning." Pt also wearing an eyepatch due to vertical diplopia. Bathing/dressing tasks were completed from EOB at sit<stand level using RW. Note that pt required several rest breaks during activity to manage  dizziness symptoms. Min A  forsit<stand and dynamic standing balance during LB self care. Pt also able to utilize seated figure 4 position to wash feet and don footwear. Min-Mod A for ambulatory transfer to the toilet using RW, pts wife assisting with close w/c follow. Note that pt had a few lateral LOBs when navigating the threshold of bathroom, requiring vcs and Mod A to recover. Pt able to use both hands functionally though does present with ataxic Lt arm/hand movements. Pt transferred to bed at close of session to rest vs transfer to recliner. Left him with all needs within reach and bed alarm set.   Provided pt with lavender aromatherapy to address HA and also an antinausea aromatherapy blend. Both pt and spouse appreciative.  2nd Session 1:1 tx (17 min) Time  missed initially due to Echo procedure. Returned later in the day and provided pt with 2 HEPs for Lt hand coordination. Pt unable to tolerate the handout activity due to visual deficits, stating his eye wouldn't let him see it. Downgraded task to simple pointing at objects in his room, increasing speed per tolerance as long as accuracy was good. Also gave him a foam block activity and discussed ways to increase therapeutic challenge. Pt with minimal episodes of dropping due to mild ataxia. Discussed with wife Ander Purpura bringing in his wireless keyboard so pt could work on [employer]. Pt does heavy computer/typework for his job. Left pt in company of visitors.  ADL ADL Eating: Not assessed Grooming: Setup Where Assessed-Grooming: Edge of bed Upper Body Bathing: Setup Where Assessed-Upper Body Bathing: Edge of bed Lower Body Bathing: Minimal assistance Where Assessed-Lower Body Bathing: Edge of bed Upper Body Dressing: Setup Where Assessed-Upper Body Dressing: Edge of bed Lower Body Dressing: Minimal assistance Where Assessed-Lower Body Dressing: Edge of bed Toileting: Not assessed Toilet Transfer: Moderate assistance Toilet Transfer  Method: Ambulating (RW) Tub/Shower Transfer: Not assessed Mobility  Bed Mobility Bed Mobility: Rolling Right;Rolling Left;Supine to Sit;Sit to Supine Rolling Right: Supervision/verbal cueing Rolling Left: Supervision/Verbal cueing Supine to Sit: Supervision/Verbal cueing Sit to Supine: Supervision/Verbal cueing Transfers Sit to Stand: Minimal Assistance - Patient > 75% Stand to Sit: Minimal Assistance - Patient > 75% Discharge Criteria: Patient will be discharged from OT if patient refuses treatment 3 consecutive times without medical reason, if treatment goals not met, if there is a change in medical status, if patient makes no progress towards goals or if patient is discharged from hospital.  The above assessment, treatment plan, treatment alternatives and goals were discussed and mutually agreed upon: by patient  Skeet Simmer 09/14/2020, 12:40 PM

## 2020-09-14 ENCOUNTER — Inpatient Hospital Stay (HOSPITAL_COMMUNITY): Payer: BC Managed Care – PPO

## 2020-09-14 DIAGNOSIS — I6389 Other cerebral infarction: Secondary | ICD-10-CM

## 2020-09-14 DIAGNOSIS — G464 Cerebellar stroke syndrome: Secondary | ICD-10-CM

## 2020-09-14 DIAGNOSIS — Q211 Atrial septal defect, unspecified: Secondary | ICD-10-CM

## 2020-09-14 HISTORY — DX: Atrial septal defect, unspecified: Q21.10

## 2020-09-14 LAB — COMPREHENSIVE METABOLIC PANEL
ALT: 59 U/L — ABNORMAL HIGH (ref 0–44)
AST: 37 U/L (ref 15–41)
Albumin: 3.4 g/dL — ABNORMAL LOW (ref 3.5–5.0)
Alkaline Phosphatase: 85 U/L (ref 38–126)
Anion gap: 8 (ref 5–15)
BUN: 9 mg/dL (ref 6–20)
CO2: 23 mmol/L (ref 22–32)
Calcium: 8.9 mg/dL (ref 8.9–10.3)
Chloride: 103 mmol/L (ref 98–111)
Creatinine, Ser: 0.84 mg/dL (ref 0.61–1.24)
GFR, Estimated: >60 mL/min (ref >60)
Glucose, Bld: 109 mg/dL — ABNORMAL HIGH (ref 70–99)
Potassium: 4 mmol/L (ref 3.5–5.1)
Sodium: 134 mmol/L — ABNORMAL LOW (ref 135–145)
Total Bilirubin: 0.8 mg/dL (ref 0.3–1.2)
Total Protein: 6.3 g/dL — ABNORMAL LOW (ref 6.5–8.1)

## 2020-09-14 LAB — URINALYSIS, COMPLETE (UACMP) WITH MICROSCOPIC
Bacteria, UA: NONE SEEN
Bilirubin Urine: NEGATIVE
Glucose, UA: NEGATIVE mg/dL
Hgb urine dipstick: NEGATIVE
Ketones, ur: NEGATIVE mg/dL
Leukocytes,Ua: NEGATIVE
Nitrite: NEGATIVE
Protein, ur: NEGATIVE mg/dL
Specific Gravity, Urine: 1.016 (ref 1.005–1.030)
pH: 6 (ref 5.0–8.0)

## 2020-09-14 LAB — ECHOCARDIOGRAM COMPLETE
Area-P 1/2: 3.99 cm2
Height: 72 in
S' Lateral: 2.7 cm
Weight: 4910.09 oz

## 2020-09-14 LAB — CBC
HCT: 47.4 % (ref 39.0–52.0)
Hemoglobin: 16.2 g/dL (ref 13.0–17.0)
MCH: 30 pg (ref 26.0–34.0)
MCHC: 34.2 g/dL (ref 30.0–36.0)
MCV: 87.8 fL (ref 80.0–100.0)
Platelets: 276 10*3/uL (ref 150–400)
RBC: 5.4 MIL/uL (ref 4.22–5.81)
RDW: 12.1 % (ref 11.5–15.5)
WBC: 13.2 10*3/uL — ABNORMAL HIGH (ref 4.0–10.5)
nRBC: 0 % (ref 0.0–0.2)

## 2020-09-14 LAB — RAPID URINE DRUG SCREEN, HOSP PERFORMED
Amphetamines: NOT DETECTED
Barbiturates: NOT DETECTED
Benzodiazepines: NOT DETECTED
Cocaine: NOT DETECTED
Opiates: POSITIVE — AB
Tetrahydrocannabinol: NOT DETECTED

## 2020-09-14 IMAGING — DX DG CHEST 1V PORT
1 series · 1 of 1 positions shown · non-contrast
Comparison: [DATE]

CLINICAL DATA: Stroke.

EXAM:
PORTABLE CHEST 1 VIEW

[chest]
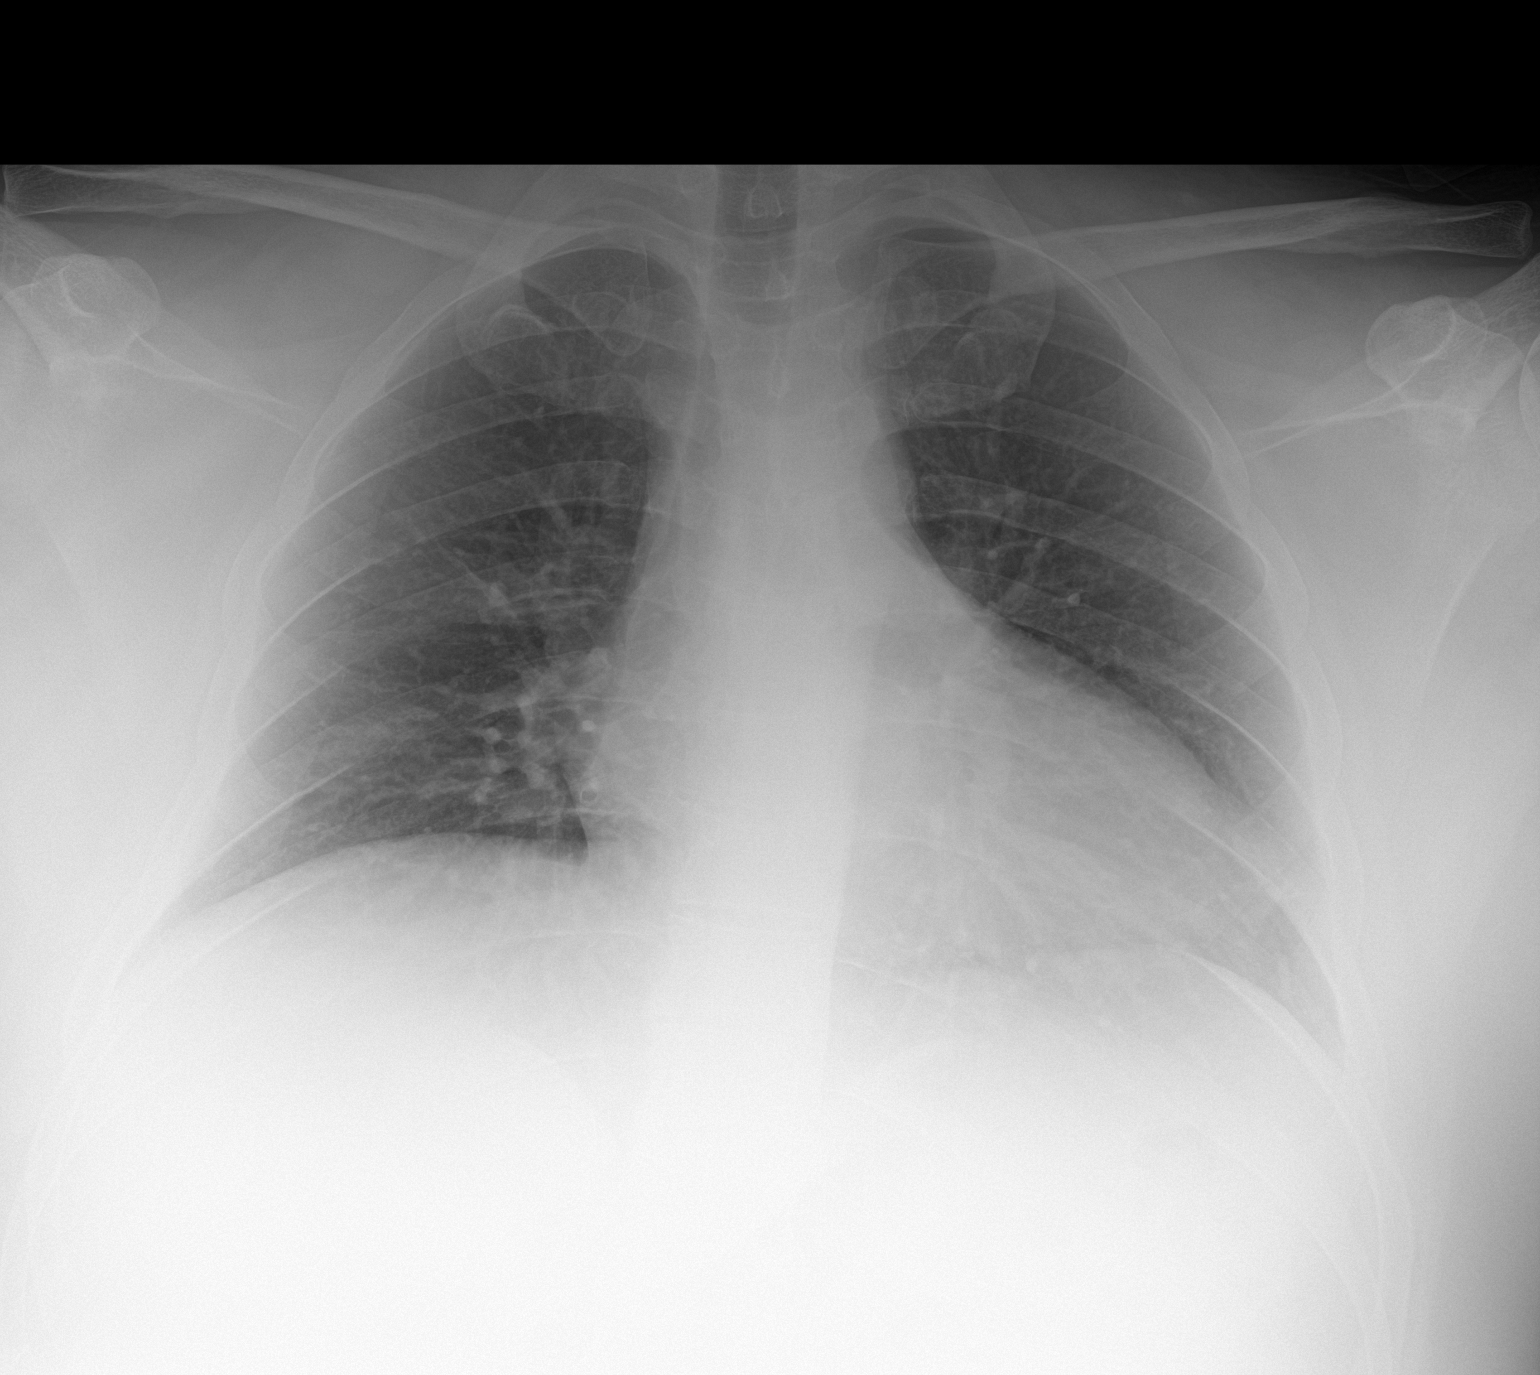

[1 of 1 positions shown; findings below may reference images not displayed]

FINDINGS: Heart is mildly enlarged and accentuated by technique. Lungs are
clear. No edema.
IMPRESSION: No active disease.

## 2020-09-14 MED ORDER — ATORVASTATIN CALCIUM 40 MG PO TABS
40.0000 mg | ORAL_TABLET | Freq: Every day | ORAL | Status: DC
  Administered 2020-09-14 – 2020-09-26 (×13): 40 mg via ORAL
  Filled 2020-09-14 (×13): qty 1

## 2020-09-14 NOTE — Plan of Care (Signed)
  Problem: Consults Goal: RH STROKE PATIENT EDUCATION Description: See Patient Education module for education specifics  Outcome: Progressing   Problem: RH BOWEL ELIMINATION Goal: RH STG MANAGE BOWEL WITH ASSISTANCE Description: STG Manage Bowel with mod I  Assistance. Outcome: Progressing Goal: RH STG MANAGE BOWEL W/MEDICATION W/ASSISTANCE Description: STG Manage Bowel with Medication with mod I Assistance. Outcome: Progressing   Problem: RH SAFETY Goal: RH STG ADHERE TO SAFETY PRECAUTIONS W/ASSISTANCE/DEVICE Description: STG Adhere to Safety Precautions With  cues/reminders Assistance/Device. Outcome: Progressing   Problem: RH PAIN MANAGEMENT Goal: RH STG PAIN MANAGED AT OR BELOW PT'S PAIN GOAL Description: At or below level 4 Outcome: Progressing   Problem: RH KNOWLEDGE DEFICIT Goal: RH STG INCREASE KNOWLEDGE OF STROKE PROPHYLAXIS Description: Patient will be able to manage secondary stroke with medications and dietary modifications independently Outcome: Progressing

## 2020-09-14 NOTE — Plan of Care (Signed)
  Problem: RH Balance Goal: LTG Patient will maintain dynamic standing with ADLs (OT) Description: LTG:  Patient will maintain dynamic standing balance with assist during activities of daily living (OT)  Flowsheets (Taken 09/14/2020 1242) LTG: Pt will maintain dynamic standing balance during ADLs with: Independent with assistive device   Problem: Sit to Stand Goal: LTG:  Patient will perform sit to stand in prep for activites of daily living with assistance level (OT) Description: LTG:  Patient will perform sit to stand in prep for activites of daily living with assistance level (OT) Flowsheets (Taken 09/14/2020 1242) LTG: PT will perform sit to stand in prep for activites of daily living with assistance level: Independent with assistive device   Problem: RH Grooming Goal: LTG Patient will perform grooming w/assist,cues/equip (OT) Description: LTG: Patient will perform grooming with assist, with/without cues using equipment (OT) Flowsheets (Taken 09/14/2020 1242) LTG: Pt will perform grooming with assistance level of: Independent with assistive device    Problem: RH Bathing Goal: LTG Patient will bathe all body parts with assist levels (OT) Description: LTG: Patient will bathe all body parts with assist levels (OT) Flowsheets (Taken 09/14/2020 1242) LTG: Pt will perform bathing with assistance level/cueing: Supervision/Verbal cueing   Problem: RH Dressing Goal: LTG Patient will perform upper body dressing (OT) Description: LTG Patient will perform upper body dressing with assist, with/without cues (OT). Flowsheets (Taken 09/14/2020 1242) LTG: Pt will perform upper body dressing with assistance level of: Independent with assistive device Goal: LTG Patient will perform lower body dressing w/assist (OT) Description: LTG: Patient will perform lower body dressing with assist, with/without cues in positioning using equipment (OT) Flowsheets (Taken 09/14/2020 1242) LTG: Pt will perform lower body  dressing with assistance level of: Independent with assistive device   Problem: RH Toilet Transfers Goal: LTG Patient will perform toilet transfers w/assist (OT) Description: LTG: Patient will perform toilet transfers with assist, with/without cues using equipment (OT) Flowsheets (Taken 09/14/2020 1242) LTG: Pt will perform toilet transfers with assistance level of: Independent with assistive device   Problem: RH Tub/Shower Transfers Goal: LTG Patient will perform tub/shower transfers w/assist (OT) Description: LTG: Patient will perform tub/shower transfers with assist, with/without cues using equipment (OT) Flowsheets (Taken 09/14/2020 1242) LTG: Pt will perform tub/shower stall transfers with assistance level of: Supervision/Verbal cueing

## 2020-09-14 NOTE — Consult Note (Signed)
Stroke Neurology Consultation Note  Consult Requested by: Dr. Francesca Jewett  Reason for Consult: Recent stroke  Consult Date: 09/14/20   The history was obtained from the patient and chart.  During history and examination, all items were able to obtain unless otherwise noted.  History of Present Illness:  Curtis [name] is a 35 y.o. Caucasian male with PMH of DJD, neck pain, headache admitted to [facility] on 09/09/2020 for dizziness, vertigo, nausea and left leaning on walking status post chiropractic therapy with neck manipulation.  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  During admission, patient complained of recurrent headache, repeat MRI showed no infarct extension, repeat CT showed no frank bleeding.  A1c 5.4, LDL 67.  Creatinine 0.73.  Patient discharged to [facility] on 09/13/2020 with DAPT and Lipitor 40.  After rehab admission, patient still have mild posterior headache, leukocytosis WBC 26.6 although overall symptoms improving.  Neurology was consulted for stroke follow-up with residual symptoms.  He denies hoarseness, swallowing difficulty but complained of left arm numbness, comes and goes.  History reviewed. No pertinent past medical history.  History reviewed. No pertinent surgical history.  History reviewed. No pertinent family history.  Social History:  reports that he has been smoking cigars. He has never used smokeless tobacco. He reports current alcohol use of about 2.0 standard drinks per week. He reports that he does not use drugs.  Allergies: No Known Allergies  No current facility-administered medications on file prior to encounter.   Current Outpatient Medications on File Prior to Encounter  Medication Sig Dispense Refill   acetaminophen-codeine (TYLENOL #3) 300-30 MG tablet Take 1 tablet by mouth every 6 (six) hours as needed for moderate pain. 30 tablet 0   aspirin EC 81 MG  EC tablet Take 1 tablet (81 mg total) by mouth daily. Swallow whole. 30 tablet 11   clopidogrel (PLAVIX) 75 MG tablet Take 1 tablet (75 mg total) by mouth daily.     gabapentin (NEURONTIN) 300 MG capsule Take 1 capsule (300 mg total) by mouth 3 (three) times daily.     meclizine (ANTIVERT) 50 MG tablet Take 1 tablet (50 mg total) by mouth 3 (three) times daily. 30 tablet 0   pantoprazole (PROTONIX) 40 MG tablet Take 1 tablet (40 mg total) by mouth daily.      Review of Systems: A full ROS was attempted today and was able to be performed.  Systems assessed include - Constitutional, Eyes, HENT, Respiratory, Cardiovascular, Gastrointestinal, Genitourinary, Integument/breast, Hematologic/lymphatic, Musculoskeletal, Neurological, Behavioral/Psych, Endocrine, Allergic/Immunologic - with pertinent responses as per HPI.  Physical Examination: Temp:  [97.4 F (36.3 C)-99.1 F (37.3 C)] 98.3 F (36.8 C) (08/20 1912) Pulse Rate:  [81-102] 84 (08/20 1912) Resp:  [17-20] 20 (08/20 1912) BP: (123-138)/(87-99) 125/99 (08/20 1912) SpO2:  [97 %-99 %] 98 % (08/20 1912)  General - well nourished, well developed, in no apparent distress.    Ophthalmologic - fundi not visualized due to noncooperation.    Cardiovascular - regular rhythm and rate  Mental Status -  Level of arousal and orientation to time, place, and person were intact. Language including expression, naming, repetition, comprehension, reading, and writing was assessed and found intact. Attention span and concentration were normal. Fund of Knowledge was assessed and was intact.  Cranial Nerves II - XII - II - Vision and visual field intact OU. III, IV, VI - Extraocular movements intact. V - Facial  sensation intact bilaterally. VII - Facial movement intact bilaterally. VIII - Hearing & vestibular intact bilaterally.  Bilateral gaze with sustained nystagmus, direction bilaterally. X - Palate elevates symmetrically. XI - Chin turning &  shoulder shrug intact bilaterally. XII - Tongue protrusion intact.  Motor Strength - The patient's strength was normal in all extremities and pronator drift was absent.   Motor Tone & Bulk - Muscle tone was assessed at the neck and appendages and was normal.  Bulk was normal and fasciculations were absent.   Reflexes - The patient's reflexes were normal in all extremities and he had no pathological reflexes.  Sensory - Light touch, temperature/pinprick were assessed and were normal.    Coordination - The patient had normal movements in the hands and feet with no ataxia or dysmetria.  However, mild clumsiness on the rapid alternative movement on the left.  Tremor was absent.  Gait and Station - deferred  Data Reviewed: CT ANGIO HEAD NECK W WO CM  Result Date: 09/11/2020 CLINICAL DATA:  Left vertebral dissection and left brainstem stroke with worsening headache, left arm weakness EXAM: CT ANGIOGRAPHY HEAD AND NECK TECHNIQUE: Multidetector CT imaging of the head and neck was performed using the standard protocol during bolus administration of intravenous contrast. Multiplanar CT image reconstructions and MIPs were obtained to evaluate the vascular anatomy. Carotid stenosis measurements (when applicable) are obtained utilizing NASCET criteria, using the distal internal carotid diameter as the denominator. CONTRAST:  108mL OMNIPAQUE IOHEXOL 350 MG/ML SOLN COMPARISON:  CTA and MRI 09/09/2020 FINDINGS: CT HEAD Brain: There is no acute intracranial hemorrhage, mass effect, or edema. Gray-white differentiation is preserved. Small infarct of the lateral left medulla is better seen on the MRI. There is no extra-axial fluid collection. Ventricles and sulci are within normal limits in size and configuration. Vascular: No new findings. Skull: Calvarium is unremarkable. Sinuses/Orbits: No acute finding. Other: None. Review of the MIP images confirms the above findings CTA NECK Aortic arch: Patent great vessel  origins. Aberrant origin of the right subclavian artery. Right carotid system: Patent. No stenosis or evidence of dissection. Left carotid system: Patent.  No stenosis or evidence of dissection. Vertebral arteries: Patent and codominant. As before, there is irregularity and narrowing of the left V2 vertebral artery, for example from C3 to C5. There is an additional area narrowing within the left C2 transverse foramen (series 11, image 197) and this appears slightly increased. Stenosis is moderate. Skeleton: No new finding. Other neck: No new finding. Upper chest: No new finding. Review of the MIP images confirms the above findings CTA HEAD Anterior circulation: Intracranial internal carotid arteries are patent. Anterior and middle cerebral arteries are patent. Posterior circulation: Intracranial right vertebral artery is patent. Intracranial left vertebral artery is patent with persistent multifocal narrowing, including focal severe stenosis. Appearance is similar to the prior study. Basilar artery is patent. Bilateral AICA and SCA origins are patent. A PICA is not identified on either side. Posterior cerebral arteries are patent. Venous sinuses: Patent as allowed by contrast bolus timing. Review of the MIP images confirms the above findings IMPRESSION: No acute intracranial abnormality. Small infarct of medulla is better seen on prior MRI. Persistent findings of left vertebral artery dissection extracranially and intracranially. Slightly increased narrowing at the level of the C2 transverse foramen. Remains up to severe stenosis intracranially. Electronically Signed   By: Guadlupe Spanish M.D.   On: 09/11/2020 15:03   CT ANGIO HEAD NECK W WO CM  Result Date: 09/09/2020 CLINICAL DATA:  Provided history: Dizziness, persistent/recurrent, cardiac or vascular cause suspected; left sided neck pain s/p chiropractic adjustment with HA. EXAM: CT ANGIOGRAPHY HEAD AND NECK TECHNIQUE: Multidetector CT imaging of the head and  neck was performed using the standard protocol during bolus administration of intravenous contrast. Multiplanar CT image reconstructions and MIPs were obtained to evaluate the vascular anatomy. Carotid stenosis measurements (when applicable) are obtained utilizing NASCET criteria, using the distal internal carotid diameter as the denominator. CONTRAST:  75mL OMNIPAQUE IOHEXOL 350 MG/ML SOLN COMPARISON:  None FINDINGS: CT HEAD FINDINGS Brain: Cerebral volume is normal. There is no acute intracranial hemorrhage. No demarcated cortical infarct. No extra-axial fluid collection. No evidence of an intracranial mass. No midline shift. Vascular: No hyperdense vessel. Skull: Normal. Negative for fracture or focal lesion. Sinuses: Moderate partial opacification of the right frontal sinus. Mild-to-moderate mucosal thickening within the left frontal sinus. Moderate partial opacification of the bilateral ethmoid air cells, likely due to the presence of mucosal thickening and fluid. Mild left maxillary sinus mucosal thickening. Orbits: No mass or acute finding. Review of the MIP images confirms the above findings CTA NECK FINDINGS Aortic arch: Aberrant right subclavian artery. The origin of the right subclavian artery is incompletely included in the field of view. The visualized aortic arch is normal in caliber. No hemodynamically significant stenosis within the proximal subclavian arteries. Right carotid system: CCA and ICA patent within the neck without stenosis or significant atherosclerotic disease. No evidence of dissection. Left carotid system: CCA and ICA patent within the neck without stenosis or significant atherosclerotic disease. No evidence of dissection Vertebral arteries: Venous contrast reflux limits evaluation of the V1 and proximal V2 segments of the right vertebral artery. Within this limitation, the right vertebral artery is patent within the neck without stenosis or evidence of dissection. The left vertebral  artery is patent within the neck. However, there are sites of irregularity and mild to moderate narrowing within the V2 left vertebral artery highly suspicious for sequela of dissection given the provided history (for instance as seen on series 7, image 137). Skeleton: No acute bony abnormality or aggressive osseous lesion. Other neck: No neck mass or cervical lymphadenopathy. Upper chest: No consolidation within the imaged lung apices. Review of the MIP images confirms the above findings CTA HEAD FINDINGS Anterior circulation: The intracranial internal carotid arteries are patent. The M1 middle cerebral arteries are patent. No M2 proximal branch occlusion or high-grade proximal stenosis is identified. The anterior cerebral arteries are patent. No intracranial aneurysm is identified. Posterior circulation: The intracranial right vertebral artery is patent without stenosis. There is irregularity and sites of up to moderate/severe narrowing within the V4 left vertebral artery, highly suspicious for dissection given the provided history (for instance as seen on series 7, images 211 and 212) (series 8, images 131 and 132). The basilar artery is patent. The posterior cerebral arteries are patent. The posterior communicating arteries are hypoplastic or absent bilaterally. Venous sinuses: Within the limitations of contrast timing, no convincing thrombus. Anatomic variants: As described. Review of the MIP images confirms the above findings These results were called by telephone at the time of interpretation on 09/09/2020 at 10:35 am to provider Jene Every , who verbally acknowledged these results. IMPRESSION: CT head: 1. No evidence of acute intracranial abnormality. 2. Paranasal sinus disease, as described. Correlate for acute sinusitis. CTA head/neck: 1. Irregularity of the V2 segment of the left vertebral artery with sites of up to mild/moderate stenosis. Irregularity of the V4 left vertebral artery with  sites of up to  moderate/severe stenosis. These findings are highly suspicious for left vertebral artery dissection given the provided history. A brain MRI is recommended to assess for acute intracranial ischemia. 2. Elsewhere within the head and neck, there is no large vessel occlusion or proximal high-grade arterial stenosis. 3. Aberrant right subclavian artery. The right subclavian artery origin is incompletely included in the field of view. Electronically Signed   By: Jackey Loge D.O.   On: 09/09/2020 10:36   CT HEAD WO CONTRAST ( )  Result Date: 09/13/2020 CLINICAL DATA:  Headache and right upper extremity numbness. EXAM: CT HEAD WITHOUT CONTRAST TECHNIQUE: Contiguous axial images were obtained from the base of the skull through the vertex without intravenous contrast. COMPARISON:  CT head from yesterday. FINDINGS: Brain: Unchanged subtle hypodensity in the left medulla consistent with known infarct. No hemorrhage by CT. No hydrocephalus, extra-axial collection or mass lesion/mass effect. Vascular: No hyperdense vessel or unexpected calcification. Skull: Normal. Negative for fracture or focal lesion. Sinuses/Orbits: Unchanged mucosal thickening of the ethmoid air cells and right frontal sinus. Other: None. IMPRESSION: 1. Unchanged subtle hypodensity in the left medulla consistent with known infarct. No hemorrhage by CT. Electronically Signed   By: Obie Dredge M.D.   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 EXAM: CT HEAD WITHOUT CONTRAST TECHNIQUE: Contiguous axial images were obtained from the base of the skull through the vertex without intravenous contrast. COMPARISON:  Head CT earlier today.  Brain MRI yesterday. FINDINGS: Brain: The known left medullary infarct is only faintly visualized by CT. The petechial hemorrhage on MRI is not seen. No additional  area of acute ischemia. No hemorrhage or hydrocephalus. No midline shift or mass effect. No extra-axial or subdural collection. Vascular: No intracranial hyperdense vessel. Skull: No fracture or focal lesion. Sinuses/Orbits: Mucosal thickening throughout the ethmoid air cells and right frontal sinus, stable from prior. No mastoid effusion. Other: None. IMPRESSION: 1. The known left medullary infarct is only faintly visualized. The petechial hemorrhage on MRI is not seen by CT. 2. No new abnormality. Electronically Signed   By: Narda Rutherford M.D.   On: 09/12/2020 21:52   CT HEAD WO CONTRAST ( )  Result Date: 09/12/2020 CLINICAL DATA:  Stroke follow-up EXAM: CT HEAD WITHOUT CONTRAST TECHNIQUE: Contiguous axial images were obtained from the base of the skull through the vertex without intravenous contrast. COMPARISON:  Brain MRI 09/11/2020 FINDINGS: Brain: There is no mass, hemorrhage or extra-axial collection. The size and configuration of the ventricles and extra-axial CSF spaces are normal. Faint hypoattenuation at the left medulla oblongata. Vascular: No abnormal hyperdensity of the major intracranial arteries or dural venous sinuses. No intracranial atherosclerosis. Skull: The visualized skull base, calvarium and extracranial soft tissues are normal. Sinuses/Orbits: No fluid levels or advanced mucosal thickening of the visualized paranasal sinuses. No mastoid or middle ear effusion. The orbits are normal. IMPRESSION: 1. No acute intracranial hemorrhage. 2. Faint hypoattenuation at the left medulla oblongata, compatible with recent infarct. Electronically Signed   By: Deatra Robinson M.D.   On: 09/12/2020 02:19   MR BRAIN WO CONTRAST  Result Date: 09/11/2020 CLINICAL DATA:  Neuro deficit, acute, stroke suspected. Left vertebral artery dissection with left lateral medullary stroke and worsening headache. EXAM: MRI HEAD WITHOUT CONTRAST TECHNIQUE: Multiplanar, multiecho pulse sequences of the brain and  surrounding structures were obtained without intravenous  contrast. COMPARISON:  CT angiography same day.  MRI 09/09/2020. FINDINGS: Brain: Acute infarction of the left medulla appears more extensive compared to the study of 2 days ago. This may simply represent evolutionary change of the infarction or could represent actual extension. Patient now has clear T2 and FLAIR signal abnormality in the region of the infarction. Mild petechial blood products are present based on the SWI imaging. No frank hematoma. Otherwise, the brain appears normal. No cerebellar or occipital lobe stroke. Cerebral hemispheres appear entirely normal. No hydrocephalus or extra-axial collection. Vascular: Major vessels at the base of the brain show flow. Skull and upper cervical spine: Negative Sinuses/Orbits: Continued sinus inflammatory changes, particularly of the right frontal sinus. Other: None IMPRESSION: More conspicuous signal abnormality in the left medulla that could possibly be evolutionary change of the previous stroke, though some extension is not excluded. Petechial blood products in the region without measurable hematoma. Electronically Signed   By: Paulina Fusi M.D.   On: 09/11/2020 18:52   MR BRAIN WO CONTRAST  Result Date: 09/09/2020 CLINICAL DATA:  Neuro deficit, acute, stroke suspected; dizziness and weakness after chiropractor visit; abnormal CTA EXAM: MRI HEAD WITHOUT CONTRAST TECHNIQUE: Multiplanar, multiecho pulse sequences of the brain and surrounding structures were obtained without intravenous contrast. COMPARISON:  None. FINDINGS: Brain: Focal reduced diffusion at the left lateral aspect of the medulla. Ventricles and sulci are normal in size and configuration. A few small foci of T2 hyperintensity in the cerebral white matter likely reflect nonspecific gliosis/demyelination of doubtful clinical significance. There is no evidence of intracranial hemorrhage. There is no intracranial mass, mass effect, or edema.  There is no hydrocephalus or extra-axial fluid collection. Vascular: Major vessel flow voids at the skull base are preserved. Skull and upper cervical spine: Normal marrow signal is preserved. Sinuses/Orbits: Paranasal sinuses are aerated. Orbits are unremarkable. Other: Sella is unremarkable.  Mastoid air cells are clear. IMPRESSION: Small acute stroke of the lateral left medulla. These results were called by telephone at the time of interpretation on 09/09/2020 at 1:11 pm to provider [facility] , who verbally acknowledged these results. Electronically Signed   By: Guadlupe Spanish M.D.   On: 09/09/2020 13:12   MR Cervical Spine Wo Contrast  Result Date: 09/09/2020 CLINICAL DATA:  Cervical radiculopathy caudal right flecks; numbness face and neck after chiropractor adjustment EXAM: MRI CERVICAL SPINE WITHOUT CONTRAST TECHNIQUE: Multiplanar, multisequence MR imaging of the cervical spine was performed. No intravenous contrast was administered. COMPARISON:  None. FINDINGS: Alignment: No significant listhesis. Vertebrae: Vertebral body heights are maintained. No marrow edema. No suspicious osseous lesion Cord: Normal caliber and signal. Posterior Fossa, vertebral arteries, paraspinal tissues: Possible crescentic abnormal signal is present about the left V2 vertebral artery at the C3-C4 to C5 levels. May reflect subintimal hematoma of dissection seen on CTA. Otherwise unremarkable. Disc levels: Minimal central protrusion at C6-C7. There is no canal or foraminal stenosis at any level. IMPRESSION: No abnormal cord signal.  No canal or foraminal stenosis. Electronically Signed   By: Guadlupe Spanish M.D.   On: 09/09/2020 13:17   DG CHEST PORT 1 VIEW  Result Date: 09/14/2020 CLINICAL DATA:  Stroke. EXAM: PORTABLE CHEST 1 VIEW COMPARISON:  09/11/2020 FINDINGS: Heart is mildly enlarged and accentuated by technique. Lungs are clear. No edema. IMPRESSION: No active disease. Electronically Signed   By: Norva Pavlov M.D.   On: 09/14/2020 10:50   DG Chest Port 1 View  Result Date: 09/11/2020 CLINICAL DATA:  Chest pain. EXAM:  PORTABLE CHEST 1 VIEW COMPARISON:  None. FINDINGS: Cardiomegaly. No focal consolidation, pleural effusion, or pneumothorax. No acute osseous abnormality. IMPRESSION: 1. Cardiomegaly. No active disease. Electronically Signed   By: Obie DredgeWilliam T Derry M.D.   On: 09/11/2020 07:22   ECHOCARDIOGRAM COMPLETE  Result Date: 09/14/2020    ECHOCARDIOGRAM REPORT   Patient Name:   Curtis [name] Date of Exam: 09/14/2020 Medical Rec #:  [id number]        Height:       72.0 in Accession #:    [id number]       Weight:       306.9 lb Date of Birth:  21-Sep-1985        BSA:          2.556 m Patient Age:    35 years         BP:           123/98 mmHg Patient Gender: M                HR:           89 bpm. Exam Location:  Inpatient Procedure: 2D Echo, Cardiac Doppler and Color Doppler Indications:    Stroke  History:        Patient has no prior history of Echocardiogram examinations.  Sonographer:    Roosvelt Maserachel Lane RDCS Referring Phys: [id number] Ridley Dileo IMPRESSIONS  1. Left ventricular ejection fraction, by estimation, is 65 to 70%. The left ventricle has normal function. The left ventricle has no regional wall motion abnormalities. Left ventricular diastolic parameters were normal.  2. Right ventricular systolic function is normal. The right ventricular size is normal.  3. There appears to be an atrial septal defect by color doppler flow. Suggest TEE and bubble study for further evaluation. . Evidence of atrial level shunting detected by color flow Doppler.  4. The mitral valve is grossly normal. No evidence of mitral valve regurgitation.  5. The aortic valve is normal in structure. Aortic valve regurgitation is not visualized. No aortic stenosis is present. FINDINGS  Left Ventricle: Left ventricular ejection fraction, by estimation, is 65 to 70%. The left ventricle has normal function. The left ventricle has no regional  wall motion abnormalities. The left ventricular internal cavity size was normal in size. There is  borderline left ventricular hypertrophy. Left ventricular diastolic parameters were normal. Right Ventricle: The right ventricular size is normal. Right vetricular wall thickness was not well visualized. Right ventricular systolic function is normal. Left Atrium: Left atrial size was normal in size. Right Atrium: Right atrial size was normal in size. Pericardium: There is no evidence of pericardial effusion. Mitral Valve: The mitral valve is grossly normal. No evidence of mitral valve regurgitation. Tricuspid Valve: The tricuspid valve is grossly normal. Tricuspid valve regurgitation is trivial. No evidence of tricuspid stenosis. Aortic Valve: The aortic valve is normal in structure. Aortic valve regurgitation is not visualized. No aortic stenosis is present. Pulmonic Valve: The pulmonic valve was normal in structure. Pulmonic valve regurgitation is not visualized. Aorta: The aortic root and ascending aorta are structurally normal, with no evidence of dilitation. IAS/Shunts: Evidence of atrial level shunting detected by color flow Doppler.  LEFT VENTRICLE PLAX 2D LVIDd:         4.60 cm  Diastology LVIDs:         2.70 cm  LV e' medial:    8.49 cm/s LV PW:         1.20 cm  LV E/e'  medial:  6.5 LV IVS:        1.00 cm  LV e' lateral:   9.25 cm/s LVOT diam:     2.10 cm  LV E/e' lateral: 5.9 LVOT Area:     3.46 cm  RIGHT VENTRICLE RV Basal diam:  3.60 cm LEFT ATRIUM           Index       RIGHT ATRIUM           Index LA diam:      3.50 cm 1.37 cm/m  RA Area:     18.10 cm LA Vol (A2C): 43.3 ml 16.94 ml/m RA Volume:   49.60 ml  19.41 ml/m LA Vol (A4C): 48.4 ml 18.94 ml/m   AORTA Ao Root diam: 3.00 cm Ao Asc diam:  2.80 cm MITRAL VALVE MV Area (PHT): 3.99 cm    SHUNTS MV Decel Time: 190 msec    Systemic Diam: 2.10 cm MV E velocity: 54.80 cm/s MV A velocity: 78.40 cm/s MV E/A ratio:  0.70 Kristeen Miss MD Electronically  signed by Kristeen Miss MD Signature Date/Time: 09/14/2020/3:59:40 PM    Final     Assessment: 35 y.o. male with PMH of DJD, neck pain, headache admitted to [facility] on 09/09/2020 for dizziness, vertigo, nausea and left leaning on walking status post chiropractic therapy with neck manipulation.  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 Discharged to [facility] on 09/13/2020 with DAPT and Lipitor 40. Patient still have mild posterior headache but overall symptoms improving.  Neurology was consulted for stroke follow-up with residual symptoms.    Patient symptoms improving, currently still has mild residual deficit.  Continue PT/OT.  Continue DAPT and statin for 3 months and follow up with outpatient neurology to repeat CT head and neck.  Leukocytosis improving, infectious work-up negative.  Encourage p.o. intake.  Plan: Continue PT/OT Continue aspirin, Plavix and Lipitor for 3 months.   Follow-up with outpatient neurology and repeat CT head and neck in 2 to 3 months Avoid abrupt neck movement Encourage p.o. intake Will follow  Thank you for this consultation and allowing [place] to participate in the care of this patient.  Marvel Plan, MD PhD Stroke Neurology 09/14/2020 7:47 PM

## 2020-09-14 NOTE — Progress Notes (Signed)
PROGRESS NOTE   Subjective/Complaints:  No new complaints.  Discussed with neurology Review of systems negative chest pain shortness of breath  Objective:   CT HEAD WO CONTRAST ( )  Result Date: 09/13/2020 CLINICAL DATA:  Headache and right upper extremity numbness. EXAM: CT HEAD WITHOUT CONTRAST TECHNIQUE: Contiguous axial images were obtained from the base of the skull through the vertex without intravenous contrast. COMPARISON:  CT head from yesterday. FINDINGS: Brain: Unchanged subtle hypodensity in the left medulla consistent with known infarct. No hemorrhage by CT. No hydrocephalus, extra-axial collection or mass lesion/mass effect. Vascular: No hyperdense vessel or unexpected calcification. Skull: Normal. Negative for fracture or focal lesion. Sinuses/Orbits: Unchanged mucosal thickening of the ethmoid air cells and right frontal sinus. Other: None. IMPRESSION: 1. Unchanged subtle hypodensity in the left medulla consistent with known infarct. No hemorrhage by CT. Electronically Signed   By: Obie Dredge M.D.   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 EXAM: CT HEAD WITHOUT CONTRAST TECHNIQUE: Contiguous axial images were obtained from the base of the skull through the vertex without intravenous contrast. COMPARISON:  Head CT earlier today.  Brain MRI yesterday. FINDINGS: Brain: The known left medullary infarct is only faintly visualized by CT. The petechial hemorrhage on MRI is not seen. No additional area of acute ischemia. No hemorrhage or hydrocephalus. No midline shift or mass effect. No extra-axial or subdural collection. Vascular: No intracranial hyperdense vessel. Skull: No fracture or focal lesion. Sinuses/Orbits: Mucosal thickening throughout the ethmoid air cells and  right frontal sinus, stable from prior. No mastoid effusion. Other: None. IMPRESSION: 1. The known left medullary infarct is only faintly visualized. The petechial hemorrhage on MRI is not seen by CT. 2. No new abnormality. Electronically Signed   By: Narda Rutherford M.D.   On: 09/12/2020 21:52   DG CHEST PORT 1 VIEW  Result Date: 09/14/2020 CLINICAL DATA:  Stroke. EXAM: PORTABLE CHEST 1 VIEW COMPARISON:  09/11/2020 FINDINGS: Heart is mildly enlarged and accentuated by technique. Lungs are clear. No edema. IMPRESSION: No active disease. Electronically Signed   By: Norva Pavlov M.D.   On: 09/14/2020 10:50   Recent Labs    09/14/20 0834  WBC 13.2*  HGB 16.2  HCT 47.4  PLT 276   Recent Labs    09/14/20 0834  NA 134*  K 4.0  CL 103  CO2 23  GLUCOSE 109*  BUN 9  CREATININE 0.84  CALCIUM 8.9    Intake/Output Summary (Last 24 hours) at 09/14/2020 1223 Last data filed at 09/14/2020 0900 Gross per 24 hour  Intake 480 ml  Output 500 ml  Net -20 ml        Physical Exam: Vital Signs Blood pressure 124/87, pulse 89, temperature (!) 97.4 F (36.3 C), temperature source Oral, resp. rate 18, height 6' (1.829 m), weight (!) 139.2 kg, SpO2 97 %.    General: No acute distress Mood and affect are appropriate Heart: Regular rate and rhythm no rubs murmurs or extra sounds Lungs: Clear to auscultation, breathing unlabored, no  rales or wheezes Abdomen: Positive bowel sounds, soft nontender to palpation, nondistended Extremities: No clubbing, cyanosis, or edema Skin: No evidence of breakdown, no evidence of rash Neurologic: Cranial nerves II through XII intact, motor strength is 5/5 in bilateral deltoid, bicep, tricep, grip, hip flexor, knee extensors, ankle dorsiflexor and plantar flexor Sensory exam normal sensation to light touch  in bilateral upper and lower extremities Cerebellar exam mild dysmetria left finger-nose-finger Musculoskeletal: Full range of motion in all 4  extremities. No joint swelling Extraocular muscles intact Assessment/Plan: 1. Functional deficits which require 3+ hours per day of interdisciplinary therapy in a comprehensive inpatient rehab setting. Physiatrist is providing close team supervision and 24 hour management of active medical problems listed below. Physiatrist and rehab team continue to assess barriers to discharge/monitor patient progress toward functional and medical goals  Care Tool:  Bathing              Bathing assist       Upper Body Dressing/Undressing Upper body dressing        Upper body assist Assist Level: Contact Guard/Touching assist    Lower Body Dressing/Undressing Lower body dressing            Lower body assist       Toileting Toileting    Toileting assist Assist for toileting: Minimal Assistance - Patient > 75%     Transfers Chair/bed transfer  Transfers assist     Chair/bed transfer assist level: Minimal Assistance - Patient > 75%     Locomotion Ambulation   Ambulation assist      Assist level: Minimal Assistance - Patient > 75% Assistive device: Walker-rolling Max distance: 119ft   Walk 10 feet activity   Assist     Assist level: Minimal Assistance - Patient > 75% Assistive device: Walker-rolling   Walk 50 feet activity   Assist    Assist level: Minimal Assistance - Patient > 75% Assistive device: Walker-rolling    Walk 150 feet activity   Assist    Assist level: Minimal Assistance - Patient > 75% Assistive device: Walker-rolling    Walk 10 feet on uneven surface  activity   Assist Walk 10 feet on uneven surfaces activity did not occur: Safety/medical concerns         Wheelchair     Assist Will patient use wheelchair at discharge?: No   Wheelchair activity did not occur: N/A         Wheelchair 50 feet with 2 turns activity    Assist    Wheelchair 50 feet with 2 turns activity did not occur: N/A       Wheelchair  150 feet activity     Assist  Wheelchair 150 feet activity did not occur: N/A       Blood pressure 124/87, pulse 89, temperature (!) 97.4 F (36.3 C), temperature source Oral, resp. rate 18, height 6' (1.829 m), weight (!) 139.2 kg, SpO2 97 %.  Medical Problem List and Plan: 1.  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  Antithrombotics: -DVT/anticoagulation: SCDs               -antiplatelet therapy: Aspirin 81 mg daily and Plavix 75 mg daily x3 months then repeat vascular imaging to determine need to continue DAPT 3. Pain Management: Neurontin 300 mg 3 times daily, Tylenol 3 as needed             -tylenol has generally been effective for intermittent headaches 4. Mood: Provide emotional support             -antipsychotic agents: N/A 5. Neuropsych: This patient is capable of making decisions on his own behalf. 6. Skin/Wound Care: Routine skin checks 7. Fluids/Electrolytes/Nutrition: Routine in and outs with follow-up chemistries on admit 8.  Obesity.  BMI 36.62.  Dietary changes shall be discussed    LOS: 1 days A FACE TO FACE EVALUATION WAS PERFORMED  Erick Colace 09/14/2020, 12:23 PM

## 2020-09-14 NOTE — Evaluation (Signed)
Physical Therapy Assessment and Plan  Patient Details  Name: Curtis [name] MRN: [id number] Date of Birth: 03-01-1985  PT Diagnosis: Abnormality of gait, Ataxia, Ataxic gait, Difficulty walking, Dizziness and giddiness, Hemiparesis non-dominant, Impaired sensation, and Muscle weakness Rehab Potential: Good ELOS: 2 weeks   Today's Date: 09/14/2020 PT Individual Time: 0800-0900 + 1300-1329 PT Individual Time Calculation (min): 60 min  + 29 min  Hospital Problem: Principal Problem:   Lateral medullary syndrome   Past Medical History: History reviewed. No pertinent past medical history. Past Surgical History: History reviewed. No pertinent surgical history.  Assessment & Plan Clinical Impression: Patient is a 35 year old right-handed male with history of obesity with BMI 36.62.  Per chart review patient lives with spouse including 49-monthold daughter.  1 level home 3 steps to entry.  Independent prior to admission.  Patient works full-time from home.  [facility] left-sided numbness, headache dizziness as well as nausea x1 week.  Per report recently presented to chiropractor 2 weeks ago for regular back adjustments.  He denied having any symptoms at that time however he has had some persistent on and off neck pain x3 weeks.  MRI of the brain showed small acute stroke of the lateral left medulla.  CTA head and neck irregularity of the V2 segment of the left vertebral artery with sites of up to mild/moderate stenosis.  Irregularity of the V4 left vertebral artery with sites of up to moderate/severe stenosis.  MRI of the brain follow-up more conspicuous signal abnormality in the left medulla that could possibly be evolutionary change of previous stroke.  CT angiogram head and neck repeated 09/11/2020 showing persistent findings of left vertebral artery dissection extracranially and intracranially.  Slightly increased narrowing at the level of the C2 transverse foramen.  Neurology  follow-up currently maintained on aspirin and Plavix for CVA prophylaxis x3 months.  Patient would require repeat vascular imaging in 3 months to determine need to continue DAPT.  Tolerating a regular consistency diet.  Therapy evaluations completed due to patient decreased functional mobility was admitted for a comprehensive rehab program. Patient transferred to CIR on 09/13/2020 .   Patient currently requires min with mobility secondary to muscle weakness, decreased cardiorespiratoy endurance, ataxia and decreased coordination, decreased visual acuity, decreased visual perceptual skills, and decreased visual motor skills, and decreased sitting balance, decreased standing balance, hemiplegia, and decreased balance strategies.  Prior to hospitalization, patient was independent  with mobility and lived with Spouse, Daughter (2133month old daughter) in a House home.  Home access is 3Stairs to enter.  Patient will benefit from skilled PT intervention to maximize safe functional mobility, minimize fall risk, and decrease caregiver burden for planned discharge home with 24 hour supervision.  Anticipate patient will benefit from follow up OP at discharge.  PT - End of Session Activity Tolerance: Tolerates 10 - 20 min activity with multiple rests Endurance Deficit: Yes Endurance Deficit Description: impacted by dizziness/nausea > fatigue PT Assessment Rehab Potential (ACUTE/IP ONLY): Good PT Barriers to Discharge: Decreased caregiver support;Other (comments);Insurance for SNF coverage;Home environment access/layout PT Barriers to Discharge Comments: 264month old baby PT Patient demonstrates impairments in the following area(s): Balance;Endurance;Motor;Sensory PT Transfers Functional Problem(s): Bed Mobility;Bed to Chair;Car PT Locomotion Functional Problem(s): Ambulation;Stairs PT Plan PT Intensity: Minimum of 1-2 x/day ,45 to 90 minutes PT Frequency: 5 out of 7 days PT Duration Estimated Length of Stay: 2  weeks PT Treatment/Interventions: Ambulation/gait training;Balance/vestibular training;Cognitive remediation/compensation;Community reintegration;Discharge planning;Disease management/prevention;DME/adaptive equipment instruction;Functional electrical stimulation;Functional mobility training;Neuromuscular  re-education;Pain management;Patient/family education;Psychosocial support;Skin care/wound management;Splinting/orthotics;Stair training;Therapeutic Activities;Therapeutic Exercise;UE/LE Strength taining/ROM;Wheelchair propulsion/positioning;Visual/perceptual remediation/compensation;UE/LE Coordination activities PT Transfers Anticipated Outcome(s): mod I with LRAD PT Locomotion Anticipated Outcome(s): supervision with LRAD PT Recommendation Follow Up Recommendations: Outpatient PT;24 hour supervision/assistance Patient destination: Home Equipment Recommended: To be determined   PT Evaluation Precautions/Restrictions Precautions Precautions: Fall Precaution Comments: [place] Syndrome. Nausea/dizziness, diplopia, rotating eye patch Restrictions Weight Bearing Restrictions: No Pain Pain Assessment Pain Scale: 0-10 Pain Score: 0-No pain Home Living/Prior Functioning Home Living Available Help at Discharge: Family;Available 24 hours/day;Other (Comment) (Wife on maternity leave until October) Type of Home: House Home Access: Stairs to enter [employer] of Steps: 3 Entrance Stairs-Rails: Left Home Layout: One level Bathroom Shower/Tub: Walk-in shower;Other (comment) (built in bench) Bathroom Toilet: Standard Bathroom Accessibility: Yes  Lives With: Spouse;Daughter (60 month old daughter) Prior Function Level of Independence: Independent with gait;Independent with transfers;Independent with homemaking with ambulation  Able to Take Stairs?: Yes Driving: Yes Vocation: Full time employment 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 Vision/Perception   Vision - Assessment Eye Alignment: Within Functional Limits Ocular Range of Motion: Within Functional Limits Tracking/Visual Pursuits: Able to track stimulus in all quads without difficulty Saccades: Impaired - to be further tested in functional context Convergence: Within functional limits Perception Perception: Impaired Praxis Praxis: Intact  Cognition Overall Cognitive Status: Within Functional Limits for tasks assessed Arousal/Alertness: Awake/alert Orientation Level: Oriented X4 Attention: Focused;Sustained Focused Attention: Appears intact Sustained Attention: Appears intact Memory: Appears intact Awareness: Appears intact Problem Solving: Appears intact Safety/Judgment: Appears intact Sensation Sensation Light Touch: Impaired by gross assessment Hot/Cold: Appears Intact Proprioception: Appears Intact Stereognosis: Appears Intact Additional Comments: diminished on R hemibody but able to detect light touch Coordination Gross Motor Movements are Fluid and Coordinated: No Fine Motor Movements are Fluid and Coordinated: No Finger Nose Finger Test: dysmetria on L Heel Shin Test: mild dyscoordination on L Motor  Motor Motor: Ataxia;Abnormal postural alignment and control  Trunk/Postural Assessment  Cervical Assessment Cervical Assessment: Within Functional Limits Thoracic Assessment Thoracic Assessment: Within Functional Limits Lumbar Assessment Lumbar Assessment: Within Functional Limits Postural Control Postural Control: Deficits on evaluation Righting Reactions: delayed  Balance Balance Balance Assessed: Yes Static Sitting Balance Static Sitting - Balance Support: Feet supported Static Sitting - Level of Assistance: 5: Stand by assistance Dynamic Sitting Balance Dynamic Sitting - Balance Support: Feet supported Dynamic Sitting - Level of Assistance: 5: Stand by assistance Static Standing Balance Static Standing - Balance Support: No upper extremity  supported Static Standing - Level of Assistance: 4: Min assist Dynamic Standing Balance Dynamic Standing - Balance Support: During functional activity Dynamic Standing - Level of Assistance: 3: Mod assist;4: Min assist Extremity Assessment      RLE Assessment RLE Assessment: Within Functional Limits LLE Assessment LLE Assessment: Exceptions to [facility] General Strength Comments: Grossly 4/5  Care Tool Care Tool Bed Mobility Roll left and right activity   Roll left and right assist level: Supervision/Verbal cueing    Sit to lying activity   Sit to lying assist level: Supervision/Verbal cueing    Lying to sitting edge of bed activity   Lying to sitting edge of bed assist level: Supervision/Verbal cueing     Care Tool Transfers Sit to stand transfer   Sit to stand assist level: Minimal Assistance - Patient > 75%    Chair/bed transfer   Chair/bed transfer assist level: Minimal Assistance - Patient > 75%     Toilet transfer  Geneticist, molecular transfer assist level: Minimal Assistance - Patient > 75%      Care Tool Locomotion Ambulation   Assist level: Minimal Assistance - Patient > 75% Assistive device: Walker-rolling Max distance: 124f  Walk 10 feet activity   Assist level: Minimal Assistance - Patient > 75% Assistive device: Walker-rolling   Walk 50 feet with 2 turns activity   Assist level: Minimal Assistance - Patient > 75% Assistive device: Walker-rolling  Walk 150 feet activity   Assist level: Minimal Assistance - Patient > 75% Assistive device: Walker-rolling  Walk 10 feet on uneven surfaces activity Walk 10 feet on uneven surfaces activity did not occur: Safety/medical concerns      Stairs Stair activity did not occur: Safety/medical concerns        Walk up/down 1 step activity Walk up/down 1 step or curb (drop down) activity did not occur: Safety/medical concerns     Walk up/down 4 steps activity did not occuR: Safety/medical concerns  Walk  up/down 4 steps activity      Walk up/down 12 steps activity Walk up/down 12 steps activity did not occur: Safety/medical concerns      Pick up small objects from floor Pick up small object from the floor (from standing position) activity did not occur: Safety/medical concerns      Wheelchair Will patient use wheelchair at discharge?: No   Wheelchair activity did not occur: N/A      Wheel 50 feet with 2 turns activity Wheelchair 50 feet with 2 turns activity did not occur: N/A    Wheel 150 feet activity Wheelchair 150 feet activity did not occur: N/A      Refer to Care Plan for Long Term Goals  SHORT TERM GOAL WEEK 1 PT Short Term Goal 1 (Week 1): STG = LTG due to ELOS  Recommendations for other services: None   Skilled Therapeutic Intervention Mobility Bed Mobility Bed Mobility: Rolling Right;Rolling Left;Supine to Sit;Sit to Supine Rolling Right: Supervision/verbal cueing Rolling Left: Supervision/Verbal cueing Supine to Sit: Supervision/Verbal cueing Sit to Supine: Supervision/Verbal cueing Transfers Transfers: Sit to Stand;Stand to Sit;Stand Pivot Transfers Sit to Stand: Minimal Assistance - Patient > 75% Stand to Sit: Minimal Assistance - Patient > 75% Stand Pivot Transfers: Minimal Assistance - Patient > 75% Stand Pivot Transfer Details: Manual facilitation for weight shifting;Manual facilitation for placement;Verbal cues for gait pattern;Verbal cues for precautions/safety;Verbal cues for technique;Verbal cues for sequencing Transfer (Assistive device): None Locomotion  Gait Ambulation: Yes Gait Assistance: Minimal Assistance - Patient > 75% Gait Distance (Feet): 175 Feet Assistive device: Rolling walker Gait Assistance Details: Verbal cues for sequencing;Verbal cues for technique;Verbal cues for precautions/safety;Verbal cues for gait pattern;Verbal cues for safe use of DME/AE;Manual facilitation for weight shifting;Manual facilitation for placement;Visual cues  for safe use of DME/AE;Tactile cues for weight shifting Gait Gait: Yes Gait Pattern: Impaired Gait Pattern: Ataxic;Shuffle;Lateral trunk lean to left;Narrow base of support;Decreased step length - right;Decreased step length - left;Step-to pattern Stairs / Additional Locomotion Stairs: No Wheelchair Mobility Wheelchair Mobility: No  Skilled Intervention:  1st session: Pt greeted supine in bed to start session - agreeable to PT evaluation. Pt pleasant and cooperative, reports no pain. Oriented x4, motivated to regain functional indep. Moderate nausea and dizziness that actually improved with mobility.  Pt wearing eye path on R eye - has been rotating b/w eyes. Initiated functional mobility as outlined above. Overall, requires supervision for bed mobility, minA for transfers, gait ~1718fwith minA and RW on level flat  surfaces. Gait deficits include L lateral trunk lean, short shuffling steps, ataxia with narrow BOS, difficulty navigating turns through doorways. Obtained a 18x20 w/c but will likely require a larger w/c for better fitting. Pt remained seated EOB at end of session with bed alarm on. Handoff of care to XR tech in room.   Instructed pt in results of PT evaluation as detailed above, PT POC, rehab potential, rehab goals, and discharge recommendations. Additionally discussed CIR's policies regarding fall safety and use of chair alarm and/or quick release belt. Pt verbalized understanding and in agreement. Will update pt's family members as they become available.   2nd session: Pt sitting on BSC to start session, wife at bedside. Handoff of care from NT. Pt agreeable and was continent of bladder - charted in flowsheets. Pt will require a bariatric BSC - relayed to RN. Sit<>stand with minA and pt able to pull pants over hips with CGA for balance. Stand<>pivot transfer with minA and no AD to EOB to remove BSC. Sit<>stand to RW with CGA and ambulates from his room to day room rehab gym, ~159f,  with minA and RW. Wife following with w/c. Gait is ataxic with increased L lateral lean, short shuffling steps. Worked on visual cues for midline and gait pattern to facilitate increased step length and L weight shift. Ambulatory transfer to Nustep where he completed 7 minutes in total with x1 brief rest break due to fatigue. Workload set to 5 and used BLE and BUE to work on coordination and reciprocal movement patterns, as well as introduction into mobility and workload for his CIR stay. Noted L lean in sitting on Nustep and required visual cues to correct. Pt reporting increased BLE fatigue afterwards and requesting w/c transport back to his room. Stand<>pivot with minA and no AD to w/c and then returned to room. Assisted back to bed in similar manner and bed mobility completed with supervision. Bed alarm on, all needs in reach, wife at bedside at conclusion of session.  Discharge Criteria: Patient will be discharged from PT if patient refuses treatment 3 consecutive times without medical reason, if treatment goals not met, if there is a change in medical status, if patient makes no progress towards goals or if patient is discharged from hospital.  The above assessment, treatment plan, treatment alternatives and goals were discussed and mutually agreed upon: by patient  CAlger SimonsPT, DPT 09/14/2020, 9:24 AM

## 2020-09-14 NOTE — Progress Notes (Signed)
  Echocardiogram 2D Echocardiogram has been performed.  Curtis [name] F 09/14/2020, 2:51 PM

## 2020-09-15 DIAGNOSIS — Q211 Atrial septal defect, unspecified: Secondary | ICD-10-CM

## 2020-09-15 NOTE — Progress Notes (Addendum)
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 He underwent stroke work up there and discharged to [facility] on 09/13/20 on DAPT and Lipitor 40. Neurology was consulted this admission due to recent medullary stroke.   Today he states that his symptoms of left sided numbness and headache remain the same. He continues to have double vision that improves with using his eye patch. Mother at bedside. Both were informed that stroke deficits will take time to resolve. Ok to treat headache with tylenol at the moment.   Counseled on avoiding strenuous neck exercises or aggressive movements involving the neck.   Neurological Examination  MS: AAOx4, following commands  Speech: Fluent repetition and naming intact CN: EOMI, Bilateral gaze with sustained nystagmus, VFF, Face symmetric, Tongue midline  Motor: Normal bulk and tone. Strength 5/5 throughout  Coordination: Ataxia noted to LUE  Sensation: Intact to light touch throughout, subjectively notes left sided numbness  Gait: Deferred   Assessment and Plan:  35 year old male w/pmh of DJD, neck pain, headache who presents with a lateral medullary stroke in the setting of left V4 dissection. Doing well today and deficits are stable from a stroke perspective.   Headache is expected given recent dissection. Can transition from Acetaminophen- Codeine to Tyenol at the discretion of the primary team for head pain, would recommend Acetaminophen solely at discharge.   Recommend the following:   - Continue PT/OT - Continue aspirin, Plavix and Lipitor for 3 months.   - Follow-up with outpatient neurology and repeat CT head and neck in 2 to 3 months - Avoid abrupt neck movement  Stark Jock, NP Stroke Service Nurse  Practitioner  Patient seen and discussed with attending physician Dr. Roda Shutters   ATTENDING NOTE: I reviewed above note and agree with the assessment and plan. Pt was seen and examined.   Patient parents at bedside, patient lying in bed, no acute event overnight, still on eyepatch for diplopia.  Working with PT OT, stated that he cannot walk with walker but still need assistance.  Encourage him to work hard with PT/OT.  2D echo showed EF 65 to 70%, however, found to have a ASD by color Doppler flow.  This ASD likely to be asymptomatic, incidental finding and not associate with current stroke which most likely due to vertebral dissection from neck manipulation instead of from cardioembolic source.  However, recommend outpatient follow-up with cardiology for further management.  Do not feel LE venous Doppler needed at this time given low suspicious and likely low yield.  Continue aspirin and Plavix DAPT for 3 months as well as Lipitor.  Follow-up with neurology as outpatient in [facility] and consider repeat CT head and neck in 1 to 2 months.  Patient educated on avoid abrupt neck movement or neck manipulation.  For detailed assessment and plan, please refer to above as I have made changes wherever appropriate.   Neurology will sign off. Please call with questions. Pt will follow up with stroke clinic NP at [facility] in about 4 weeks. Thanks for the consult.   Marvel Plan, MD PhD Stroke Neurology 09/15/2020 5:29 PM

## 2020-09-15 NOTE — Progress Notes (Signed)
PROGRESS NOTE   Subjective/Complaints:  Discussed visual complaints as well as left lateral portion.  We discussed prognosis and time course for recovery, mom is at bedside. Discussed echocardiogram report. Review of systems negative chest pain shortness of breath  Objective:   DG CHEST PORT 1 VIEW  Result Date: 09/14/2020 CLINICAL DATA:  Stroke. EXAM: PORTABLE CHEST 1 VIEW COMPARISON:  09/11/2020 FINDINGS: Heart is mildly enlarged and accentuated by technique. Lungs are clear. No edema. IMPRESSION: No active disease. Electronically Signed   By: Norva Pavlov M.D.   On: 09/14/2020 10:50   ECHOCARDIOGRAM COMPLETE  Result Date: 09/14/2020    ECHOCARDIOGRAM REPORT   Patient Name:   Curtis [name] Date of Exam: 09/14/2020 Medical Rec #:  [id number]        Height:       72.0 in Accession #:    [id number]       Weight:       306.9 lb Date of Birth:  [birth year]        BSA:          2.556 m Patient Age:    35 years         BP:           123/98 mmHg Patient Gender: M                HR:           89 bpm. Exam Location:  Inpatient Procedure: 2D Echo, Cardiac Doppler and Color Doppler Indications:    Stroke  History:        Patient has no prior history of Echocardiogram examinations.  Sonographer:    Roosvelt Maser RDCS Referring Phys: [id number] JINDONG XU IMPRESSIONS  1. Left ventricular ejection fraction, by estimation, is 65 to 70%. The left ventricle has normal function. The left ventricle has no regional wall motion abnormalities. Left ventricular diastolic parameters were normal.  2. Right ventricular systolic function is normal. The right ventricular size is normal.  3. There appears to be an atrial septal defect by color doppler flow. Suggest TEE and bubble study for further evaluation. . Evidence of atrial level shunting detected by color flow Doppler.  4. The mitral valve is grossly normal. No evidence of mitral valve regurgitation.  5. The aortic  valve is normal in structure. Aortic valve regurgitation is not visualized. No aortic stenosis is present. FINDINGS  Left Ventricle: Left ventricular ejection fraction, by estimation, is 65 to 70%. The left ventricle has normal function. The left ventricle has no regional wall motion abnormalities. The left ventricular internal cavity size was normal in size. There is  borderline left ventricular hypertrophy. Left ventricular diastolic parameters were normal. Right Ventricle: The right ventricular size is normal. Right vetricular wall thickness was not well visualized. Right ventricular systolic function is normal. Left Atrium: Left atrial size was normal in size. Right Atrium: Right atrial size was normal in size. Pericardium: There is no evidence of pericardial effusion. Mitral Valve: The mitral valve is grossly normal. No evidence of mitral valve regurgitation. Tricuspid Valve: The tricuspid valve is grossly normal. Tricuspid valve regurgitation is trivial. No evidence of tricuspid stenosis. Aortic Valve:  The aortic valve is normal in structure. Aortic valve regurgitation is not visualized. No aortic stenosis is present. Pulmonic Valve: The pulmonic valve was normal in structure. Pulmonic valve regurgitation is not visualized. Aorta: The aortic root and ascending aorta are structurally normal, with no evidence of dilitation. IAS/Shunts: Evidence of atrial level shunting detected by color flow Doppler.  LEFT VENTRICLE PLAX 2D LVIDd:         4.60 cm  Diastology LVIDs:         2.70 cm  LV e' medial:    8.49 cm/s LV PW:         1.20 cm  LV E/e' medial:  6.5 LV IVS:        1.00 cm  LV e' lateral:   9.25 cm/s LVOT diam:     2.10 cm  LV E/e' lateral: 5.9 LVOT Area:     3.46 cm  RIGHT VENTRICLE RV Basal diam:  3.60 cm LEFT ATRIUM           Index       RIGHT ATRIUM           Index LA diam:      3.50 cm 1.37 cm/m  RA Area:     18.10 cm LA Vol (A2C): 43.3 ml 16.94 ml/m RA Volume:   49.60 ml  19.41 ml/m LA Vol (A4C):  48.4 ml 18.94 ml/m   AORTA Ao Root diam: 3.00 cm Ao Asc diam:  2.80 cm MITRAL VALVE MV Area (PHT): 3.99 cm    SHUNTS MV Decel Time: 190 msec    Systemic Diam: 2.10 cm MV E velocity: 54.80 cm/s MV A velocity: 78.40 cm/s MV E/A ratio:  0.70 Kristeen Miss MD Electronically signed by Kristeen Miss MD Signature Date/Time: 09/14/2020/3:59:40 PM    Final    Recent Labs    09/14/20 0834  WBC 13.2*  HGB 16.2  HCT 47.4  PLT 276    Recent Labs    09/14/20 0834  NA 134*  K 4.0  CL 103  CO2 23  GLUCOSE 109*  BUN 9  CREATININE 0.84  CALCIUM 8.9     Intake/Output Summary (Last 24 hours) at 09/15/2020 1115 Last data filed at 09/15/2020 0700 Gross per 24 hour  Intake 796 ml  Output 700 ml  Net 96 ml         Physical Exam: Vital Signs Blood pressure 131/88, pulse 81, temperature 97.7 F (36.5 C), temperature source Oral, resp. rate 18, height 6' (1.829 m), weight (!) 139.2 kg, SpO2 98 %.    General: No acute distress Mood and affect are appropriate Heart: Regular rate and rhythm no rubs murmurs or extra sounds Lungs: Clear to auscultation, breathing unlabored, no rales or wheezes Abdomen: Positive bowel sounds, soft nontender to palpation, nondistended Extremities: No clubbing, cyanosis, or edema Skin: No evidence of breakdown, no evidence of rash Neurologic: Mild skew deviation, cranial nerve IV, motor strength is 5/5 in bilateral deltoid, bicep, tricep, grip, hip flexor, knee extensors, ankle dorsiflexor and plantar flexor Sensory exam normal sensation to light touch  in bilateral upper and lower extremities Cerebellar exam mild dysmetria left finger-nose-finger Musculoskeletal: Full range of motion in all 4 extremities. No joint swelling Extraocular muscles intact Assessment/Plan: 1. Functional deficits which require 3+ hours per day of interdisciplinary therapy in a comprehensive inpatient rehab setting. Physiatrist is providing close team supervision and 24 hour management  of active medical problems listed below. Physiatrist and rehab team continue to assess barriers  to discharge/monitor patient progress toward functional and medical goals  Care Tool:  Bathing    Body parts bathed by patient: Right arm, Left arm         Bathing assist Assist Level: Minimal Assistance - Patient > 75%     Upper Body Dressing/Undressing Upper body dressing   What is the patient wearing?: Pull over shirt    Upper body assist Assist Level: Set up assist    Lower Body Dressing/Undressing Lower body dressing      What is the patient wearing?: Pants, Underwear/pull up     Lower body assist Assist for lower body dressing: Minimal Assistance - Patient > 75%     Toileting Toileting Toileting Activity did not occur (Clothing management and hygiene only): N/A (no void or bm)  Toileting assist Assist for toileting: Minimal Assistance - Patient > 75%     Transfers Chair/bed transfer  Transfers assist     Chair/bed transfer assist level: Minimal Assistance - Patient > 75%     Locomotion Ambulation   Ambulation assist      Assist level: Minimal Assistance - Patient > 75% Assistive device: Walker-rolling Max distance: 12375ft   Walk 10 feet activity   Assist     Assist level: Minimal Assistance - Patient > 75% Assistive device: Walker-rolling   Walk 50 feet activity   Assist    Assist level: Minimal Assistance - Patient > 75% Assistive device: Walker-rolling    Walk 150 feet activity   Assist    Assist level: Minimal Assistance - Patient > 75% Assistive device: Walker-rolling    Walk 10 feet on uneven surface  activity   Assist Walk 10 feet on uneven surfaces activity did not occur: Safety/medical concerns         Wheelchair     Assist Will patient use wheelchair at discharge?: No   Wheelchair activity did not occur: N/A         Wheelchair 50 feet with 2 turns activity    Assist    Wheelchair 50 feet with 2  turns activity did not occur: N/A       Wheelchair 150 feet activity     Assist  Wheelchair 150 feet activity did not occur: N/A       Blood pressure 131/88, pulse 81, temperature 97.7 F (36.5 C), temperature source Oral, resp. rate 18, height 6' (1.829 m), weight (!) 139.2 kg, SpO2 98 %.  Medical Problem List and Plan: 1.  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  Antithrombotics: -DVT/anticoagulation: SCDs               -antiplatelet therapy: Aspirin 81 mg daily and Plavix 75 mg daily x3 months then repeat vascular imaging to determine need to continue DAPT 3. Pain Management: Neurontin 300 mg 3 times daily, Tylenol 3 as needed             -tylenol has generally been effective for intermittent headaches 4. Mood: Provide emotional support             -  antipsychotic agents: N/A 5. Neuropsych: This patient is capable of making decisions on his own behalf. 6. Skin/Wound Care: Routine skin checks 7. Fluids/Electrolytes/Nutrition: Routine in and outs with follow-up chemistries on admit 8.  Obesity.  BMI 36.62.  Dietary changes shall be discussed 9.  PFO noted on transthoracic echo, right to left shunt noted.  Unlikely cause of current stroke given other findings, defer to neurology whether or not TEE is needed at this time.  LOS: 2 days A FACE TO FACE EVALUATION WAS PERFORMED  Erick Colace 09/15/2020, 11:15 AM

## 2020-09-16 LAB — CBC WITH DIFFERENTIAL/PLATELET
Abs Immature Granulocytes: 0.06 10*3/uL (ref 0.00–0.07)
Basophils Absolute: 0.1 10*3/uL (ref 0.0–0.1)
Basophils Relative: 1 %
Eosinophils Absolute: 0.7 10*3/uL — ABNORMAL HIGH (ref 0.0–0.5)
Eosinophils Relative: 5 %
HCT: 48.4 % (ref 39.0–52.0)
Hemoglobin: 16.5 g/dL (ref 13.0–17.0)
Immature Granulocytes: 1 %
Lymphocytes Relative: 25 %
Lymphs Abs: 3.2 10*3/uL (ref 0.7–4.0)
MCH: 30.1 pg (ref 26.0–34.0)
MCHC: 34.1 g/dL (ref 30.0–36.0)
MCV: 88.3 fL (ref 80.0–100.0)
Monocytes Absolute: 1.1 10*3/uL — ABNORMAL HIGH (ref 0.1–1.0)
Monocytes Relative: 9 %
Neutro Abs: 7.9 10*3/uL — ABNORMAL HIGH (ref 1.7–7.7)
Neutrophils Relative %: 59 %
Platelets: 250 10*3/uL (ref 150–400)
RBC: 5.48 MIL/uL (ref 4.22–5.81)
RDW: 12 % (ref 11.5–15.5)
WBC: 13 10*3/uL — ABNORMAL HIGH (ref 4.0–10.5)
nRBC: 0 % (ref 0.0–0.2)

## 2020-09-16 LAB — COMPREHENSIVE METABOLIC PANEL
ALT: 55 U/L — ABNORMAL HIGH (ref 0–44)
AST: 35 U/L (ref 15–41)
Albumin: 3.3 g/dL — ABNORMAL LOW (ref 3.5–5.0)
Alkaline Phosphatase: 87 U/L (ref 38–126)
Anion gap: 6 (ref 5–15)
BUN: 9 mg/dL (ref 6–20)
CO2: 30 mmol/L (ref 22–32)
Calcium: 9.2 mg/dL (ref 8.9–10.3)
Chloride: 103 mmol/L (ref 98–111)
Creatinine, Ser: 0.91 mg/dL (ref 0.61–1.24)
GFR, Estimated: >60 mL/min (ref >60)
Glucose, Bld: 92 mg/dL (ref 70–99)
Potassium: 4.9 mmol/L (ref 3.5–5.1)
Sodium: 139 mmol/L (ref 135–145)
Total Bilirubin: 0.7 mg/dL (ref 0.3–1.2)
Total Protein: 6.6 g/dL (ref 6.5–8.1)

## 2020-09-16 NOTE — Progress Notes (Signed)
Physical Therapy Session Note  Patient Details  Name: Curtis [name] MRN: [id number] Date of Birth: 22-Dec-1985  Today's Date: 09/16/2020 PT Individual Time: 0932-6712 PT Individual Time Calculation (min): 55 min   Short Term Goals: Week 1:  PT Short Term Goal 1 (Week 1): STG = LTG due to ELOS  Skilled Therapeutic Interventions/Progress Updates:    Pt received supine in bed and agreeable to therapy session. Supine>sitting R EOB with supervision. Pt dons L eye patch at beginning of session with pt reporting he has double vision and is not able to maintain his balance at all without the eye patch. Sitting EOB donned tennis shoes with set-up assist. Sit>stand EOB>RW with CGA. Gait training ~158ft to main therapy gym using RW with min assist for balance - demos intermittent L lateral lean but is able to stabilize using RW without increased assist. Donned maxi-sky harness. Gait training in maxi-sky harness 58ft, 52ft, 1ft, all without UE support targeting lateral stability, with prolonged seated rest breaks between each bout as pt with significant dizziness upon 2nd turn at end of walk back towards mat - continues to demo L lateral lean with lack of sufficient R weight shift resulting in decreased L LE step length compared to when using RW.  MD in/out for morning assessment. Gait training ~55ft back towards room using RW with min assist for balance and pt demoing increased L lean with pt reporting increased symptoms of dizziness. Pt unable to ambulate full distance back towards room due to dizziness therefore therapist retrieved w/c. L stand pivot chair>w/c, no AD but using UE support on armrests, with min assist for balance due to L lean. Transported back to room. R stand pivot w/c>EOB using bedrail support with CGA. Sit>supine supervision. Pt left supine in bed, HOB partially elevated, with lights off and door shut for decreased visual stimulation for symptom management (pt still wearing L eye patch at  this time but planning to remove once symptoms decrease). Left with needs in reach and bed alarm on.  Therapy Documentation Precautions:  Precautions Precautions: Fall Precaution Comments: [place] Syndrome. Nausea/dizziness, diplopia, rotating eye patch Restrictions Weight Bearing Restrictions: No   Pain: No reports of pain throughout session.  Therapy/Group: Individual Therapy  Ginny Forth , PT, DPT, NCS, CSRS 09/16/2020, 7:50 AM

## 2020-09-16 NOTE — Care Management (Signed)
Inpatient Rehabilitation Center Individual Statement of Services  Patient Name:  Curtis [name]  Date:  09/16/2020  Welcome to the Inpatient Rehabilitation Center.  Our goal is to provide you with an individualized program based on your diagnosis and situation, designed to meet your specific needs.  With this comprehensive rehabilitation program, you will be expected to participate in at least 3 hours of rehabilitation therapies Monday-Friday, with modified therapy programming on the weekends.  Your rehabilitation program will include the following services:  Physical Therapy (PT), Occupational Therapy (OT), Speech Therapy (ST), 24 hour per day rehabilitation nursing, Therapeutic Recreaction (TR), Psychology, Neuropsychology, Care Coordinator, Rehabilitation Medicine, Nutrition Services, Pharmacy Services, and Other  Weekly team conferences will be held on Wednesday to discuss your progress.  Your Inpatient Rehabilitation Care Coordinator will talk with you frequently to get your input and to update you on team discussions.  Team conferences with you and your family in attendance may also be held.  Expected length of stay: 2-3 weeks    Overall anticipated outcome: Independent  Depending on your progress and recovery, your program may change. Your Inpatient Rehabilitation Care Coordinator will coordinate services and will keep you informed of any changes. Your Inpatient Rehabilitation Care Coordinator's name and contact numbers are listed  below.  The following services may also be recommended but are not provided by the Inpatient Rehabilitation Center:  Driving Evaluations Home Health Rehabiltiation Services Outpatient Rehabilitation Services Vocational Rehabilitation   Arrangements will be made to provide these services after discharge if needed.  Arrangements include referral to agencies that provide these services.  Your insurance has been verified to be:  [employer]  Your primary doctor  is:  No PCP  Pertinent information will be shared with your doctor and your insurance company.  Inpatient Rehabilitation Care Coordinator:  Lavera Guise, [place] [phone] or [phone]  Information discussed with and copy given to patient by: Gretchen Short, 09/16/2020, 8:29 AM

## 2020-09-16 NOTE — Progress Notes (Addendum)
PROGRESS NOTE   Subjective/Complaints: Stacked diplopia if eye patch is off  Walking with harness support no AD, dizzy with turns , Left lateral pulsion  No vomiting   Review of systems negative chest pain shortness of breath. + nausea , neg V/D  Objective:   DG CHEST PORT 1 VIEW  Result Date: 09/14/2020 CLINICAL DATA:  Stroke. EXAM: PORTABLE CHEST 1 VIEW COMPARISON:  09/11/2020 FINDINGS: Heart is mildly enlarged and accentuated by technique. Lungs are clear. No edema. IMPRESSION: No active disease. Electronically Signed   By: Norva Pavlov M.D.   On: 09/14/2020 10:50   ECHOCARDIOGRAM COMPLETE  Result Date: 09/14/2020    ECHOCARDIOGRAM REPORT   Patient Name:   Curtis [name] Date of Exam: 09/14/2020 Medical Rec #:  [id number]        Height:       72.0 in Accession #:    [id number]       Weight:       306.9 lb Date of Birth:  1985-06-20        BSA:          2.556 m Patient Age:    35 years         BP:           123/98 mmHg Patient Gender: M                HR:           89 bpm. Exam Location:  Inpatient Procedure: 2D Echo, Cardiac Doppler and Color Doppler Indications:    Stroke  History:        Patient has no prior history of Echocardiogram examinations.  Sonographer:    Roosvelt Maser RDCS Referring Phys: [id number] JINDONG XU IMPRESSIONS  1. Left ventricular ejection fraction, by estimation, is 65 to 70%. The left ventricle has normal function. The left ventricle has no regional wall motion abnormalities. Left ventricular diastolic parameters were normal.  2. Right ventricular systolic function is normal. The right ventricular size is normal.  3. There appears to be an atrial septal defect by color doppler flow. Suggest TEE and bubble study for further evaluation. . Evidence of atrial level shunting detected by color flow Doppler.  4. The mitral valve is grossly normal. No evidence of mitral valve regurgitation.  5. The aortic valve is normal  in structure. Aortic valve regurgitation is not visualized. No aortic stenosis is present. FINDINGS  Left Ventricle: Left ventricular ejection fraction, by estimation, is 65 to 70%. The left ventricle has normal function. The left ventricle has no regional wall motion abnormalities. The left ventricular internal cavity size was normal in size. There is  borderline left ventricular hypertrophy. Left ventricular diastolic parameters were normal. Right Ventricle: The right ventricular size is normal. Right vetricular wall thickness was not well visualized. Right ventricular systolic function is normal. Left Atrium: Left atrial size was normal in size. Right Atrium: Right atrial size was normal in size. Pericardium: There is no evidence of pericardial effusion. Mitral Valve: The mitral valve is grossly normal. No evidence of mitral valve regurgitation. Tricuspid Valve: The tricuspid valve is grossly normal. Tricuspid valve regurgitation is trivial. No evidence  of tricuspid stenosis. Aortic Valve: The aortic valve is normal in structure. Aortic valve regurgitation is not visualized. No aortic stenosis is present. Pulmonic Valve: The pulmonic valve was normal in structure. Pulmonic valve regurgitation is not visualized. Aorta: The aortic root and ascending aorta are structurally normal, with no evidence of dilitation. IAS/Shunts: Evidence of atrial level shunting detected by color flow Doppler.  LEFT VENTRICLE PLAX 2D LVIDd:         4.60 cm  Diastology LVIDs:         2.70 cm  LV e' medial:    8.49 cm/s LV PW:         1.20 cm  LV E/e' medial:  6.5 LV IVS:        1.00 cm  LV e' lateral:   9.25 cm/s LVOT diam:     2.10 cm  LV E/e' lateral: 5.9 LVOT Area:     3.46 cm  RIGHT VENTRICLE RV Basal diam:  3.60 cm LEFT ATRIUM           Index       RIGHT ATRIUM           Index LA diam:      3.50 cm 1.37 cm/m  RA Area:     18.10 cm LA Vol (A2C): 43.3 ml 16.94 ml/m RA Volume:   49.60 ml  19.41 ml/m LA Vol (A4C): 48.4 ml 18.94  ml/m   AORTA Ao Root diam: 3.00 cm Ao Asc diam:  2.80 cm MITRAL VALVE MV Area (PHT): 3.99 cm    SHUNTS MV Decel Time: 190 msec    Systemic Diam: 2.10 cm MV E velocity: 54.80 cm/s MV A velocity: 78.40 cm/s MV E/A ratio:  0.70 Kristeen Miss MD Electronically signed by Kristeen Miss MD Signature Date/Time: 09/14/2020/3:59:40 PM    Final    Recent Labs    09/14/20 0834 09/16/20 0627  WBC 13.2* 13.0*  HGB 16.2 16.5  HCT 47.4 48.4  PLT 276 250    Recent Labs    09/14/20 0834 09/16/20 0627  NA 134* 139  K 4.0 4.9  CL 103 103  CO2 23 30  GLUCOSE 109* 92  BUN 9 9  CREATININE 0.84 0.91  CALCIUM 8.9 9.2     Intake/Output Summary (Last 24 hours) at 09/16/2020 0841 Last data filed at 09/16/2020 0801 Gross per 24 hour  Intake 712 ml  Output --  Net 712 ml         Physical Exam: Vital Signs Blood pressure (!) 133/97, pulse 85, temperature 98.2 F (36.8 C), temperature source Oral, resp. rate 16, height 6' (1.829 m), weight (!) 139.2 kg, SpO2 100 %.   General: No acute distress Mood and affect are appropriate Heart: Regular rate and rhythm no rubs murmurs or extra sounds Lungs: Clear to auscultation, breathing unlabored, no rales or wheezes Abdomen: Positive bowel sounds, soft nontender to palpation, nondistended Extremities: No clubbing, cyanosis, or edema Skin: No evidence of breakdown, no evidence of rash  Musculoskeletal: Full range of motion in all 4 extremities. No joint swelling  Neurologic: Mild skew deviation, cranial nerve IV, motor strength is 5/5 in bilateral deltoid, bicep, tricep, grip, hip flexor, knee extensors, ankle dorsiflexor and plantar flexor Sensory exam normal sensation to light touch  in bilateral upper and lower extremities Cerebellar exam mild dysmetria left finger-nose-finger Musculoskeletal: Full range of motion in all 4 extremities. No joint swelling Extraocular muscles intact Assessment/Plan: 1. Functional deficits which require 3+ hours per  day of interdisciplinary therapy in a comprehensive inpatient rehab setting. Physiatrist is providing close team supervision and 24 hour management of active medical problems listed below. Physiatrist and rehab team continue to assess barriers to discharge/monitor patient progress toward functional and medical goals  Care Tool:  Bathing    Body parts bathed by patient: Right arm, Left arm         Bathing assist Assist Level: Minimal Assistance - Patient > 75%     Upper Body Dressing/Undressing Upper body dressing   What is the patient wearing?: Pull over shirt    Upper body assist Assist Level: Set up assist    Lower Body Dressing/Undressing Lower body dressing      What is the patient wearing?: Pants, Underwear/pull up     Lower body assist Assist for lower body dressing: Minimal Assistance - Patient > 75%     Toileting Toileting Toileting Activity did not occur (Clothing management and hygiene only): N/A (no void or bm)  Toileting assist Assist for toileting: Minimal Assistance - Patient > 75%     Transfers Chair/bed transfer  Transfers assist     Chair/bed transfer assist level: Minimal Assistance - Patient > 75%     Locomotion Ambulation   Ambulation assist      Assist level: Minimal Assistance - Patient > 75% Assistive device: Walker-rolling Max distance: 1175ft   Walk 10 feet activity   Assist     Assist level: Minimal Assistance - Patient > 75% Assistive device: Walker-rolling   Walk 50 feet activity   Assist    Assist level: Minimal Assistance - Patient > 75% Assistive device: Walker-rolling    Walk 150 feet activity   Assist    Assist level: Minimal Assistance - Patient > 75% Assistive device: Walker-rolling    Walk 10 feet on uneven surface  activity   Assist Walk 10 feet on uneven surfaces activity did not occur: Safety/medical concerns         Wheelchair     Assist Is the patient using a wheelchair?: No    Wheelchair activity did not occur: N/A         Wheelchair 50 feet with 2 turns activity    Assist    Wheelchair 50 feet with 2 turns activity did not occur: N/A       Wheelchair 150 feet activity     Assist  Wheelchair 150 feet activity did not occur: N/A       Blood pressure (!) 133/97, pulse 85, temperature 98.2 F (36.8 C), temperature source Oral, resp. rate 16, height 6' (1.829 m), weight (!) 139.2 kg, SpO2 100 %.  Medical Problem List and Plan: 1.  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  Antithrombotics: -DVT/anticoagulation: SCDs               -antiplatelet therapy: Aspirin 81 mg daily and Plavix 75 mg daily x3 months then repeat vascular imaging to determine need to continue DAPT 3. Pain Management: Neurontin 300 mg 3 times daily, Tylenol 3 as needed             -tylenol has generally been effective for intermittent  headaches 4. Mood: Provide emotional support             -antipsychotic agents: N/A 5. Neuropsych: This patient is capable of making decisions on his own behalf. 6. Skin/Wound Care: Routine skin checks 7. Fluids/Electrolytes/Nutrition: Routine in and outs with follow-up chemistries on admit 8.  Obesity.  BMI 36.62.  Dietary changes shall be discussed 9.  PFO noted on transthoracic echo, right to left shunt noted.  Unlikely cause of current stroke no further w/u per neuro, may f/u with cardiology as OP  10.  CN 4 palsy left , expect this to improve as it is outside of the main infarct location  LOS: 3 days A FACE TO FACE EVALUATION WAS PERFORMED  Erick Colace 09/16/2020, 8:41 AM

## 2020-09-16 NOTE — Progress Notes (Signed)
Occupational Therapy Session Note  Patient Details  Name: Curtis [name] MRN: [id number] Date of Birth: 1985-08-03  Today's Date: 09/16/2020 OT Individual Time: 5465-6812 OT Individual Time Calculation (min): 59 min    Short Term Goals: Week 1:  OT Short Term Goal 1 (Week 1): Pt will complete an ambulatory toilet transfer using RW with no more than Min A OT Short Term Goal 2 (Week 1): Pt will complete LB dressing at sit<stand level with no more than CGA OT Short Term Goal 3 (Week 1): Pt will complete 2 grooming tasks while standing at the sink while independently utilizing 2 compensatory strategies to manage symptoms of dizzyness/nausea  Skilled Therapeutic Interventions/Progress Updates:    Session 1: (7517-0017)  Pt in bed to start session with reports of increased nausea and dizziness with PT this am.  He was agreeable to OT with request from nursing for nausea meds.  Dizziness 4/10 on supine with eye patch on and prescription glasses in place.  He was able to transfer to the EOB with supervision and then completed stand pivot transfer to the wheelchair at min assist.  Slight ataxia in during transfer but more dizziness noted at 8-9/10 with standing.  Took him down via wheelchair to the gym with no increase in dizziness or nausea noted when pushing him or with head turns while being rolled.  Nursing gave nausea meds as well.  He transferred over to the therapy mat at min assist level where he worked on Public librarian and convergence exercises.  Noted resting upward nystagmus in both eyes which continues with tracking, however occular ROM WNLs as well as being abel to adequately maintain fixation on target with both eyes simultaneously as well as individually.  Diplopia reported in all fields with one being over top of the other.  Issued tracking exercises and convergence exercises for continued practice in his room.  No increased in dizziness when tracking with the other eye patched, but it  increases with both eyes open.  He would close his eyes at times to get relief.  Tested VOR and VOR cancellation which was normal.  Only significant increase in dizziness was with bending forward and for sit to stand when returning to the wheelchair and then back to the EOB.  Dizziness would go up to an 8/10 with these movements.  Worked on gaze fixation with movements to transition from one surface to the other.  He was left in his room with the call button and phone in reach resting in the bed.    Session 2: (1400-1456)  Pt in bed reporting dizziness at 2/10 in supine.  With transition to right sidelying it remained the same but increased with transition to sitting to 4-5/10.  This only lasted a few seconds and then it settled back down.  With transition to sidelying to sitting, minimal dizziness was noted.  Completed this with transition from sidelying to sit on both the right and left sides, with slightly greater dizziness noted with transition to sitting from the right sidelying vs left.  Had him complete stand pivot transfer to the wheelchair at min assist level.  Dizziness increased up to 6-7/10 with standing.  Took pt down to the therapy gym where he completed transfer to the mat at the same level.  Had him work on ball toss and catch in sitting with larger and smaller beach ball.  First with eyes open and then with right eye occluded.  He demonstrated some decreased LUE coordination but  was able to catch and throw the balls back with around 90% accuracy.  No increase in dizziness was noted overall with one eye covered but it increased when both eyes were open.  Therapist applied black coban to the right lens of his glasses to try instead of the patch to allow for peripheral light.  Transitioned to standing where pt reported significant increased in dizziness 7-8/10 after standing with head and gaze fixed at midline.  Noted nystagmus at rest, even when given target to focus on at midline.  He would have to  sit down after approximately 1-1.5 mins.  He reported decreased dizziness in standing when turning his head slightly left or right but maintaining midline gaze to target.  Min guard for standing balance overall statically.  Transferred back to the wheelchair at min assist level and then back to the bed in the room at the same level.  Call button and phone in reach with safety alarm in place.    Therapy Documentation Precautions:  Precautions Precautions: Fall Precaution Comments: Wallenburg Syndrome. Nausea/dizziness, diplopia, rotating eye patch Restrictions Weight Bearing Restrictions: No  Pain: Pain Assessment Pain Scale: Faces Pain Score: 0-No pain ADL: See Care Tool Section for some details of mobility and selfcare  Therapy/Group: Individual Therapy  Ralene Gasparyan OTR/L 09/16/2020, 12:47 PM

## 2020-09-16 NOTE — Progress Notes (Signed)
Inpatient Rehabilitation  Patient information reviewed and entered into eRehab system by Kuba Shepherd M. Ty Oshima, M.A., CCC/SLP, PPS Coordinator.  Information including medical coding, functional ability and quality indicators will be reviewed and updated through discharge.    

## 2020-09-16 NOTE — Progress Notes (Signed)
PRN tylenol #3 given at 1955, complained of Left sided headache. Reports feeling like hands are asleep, not a new complaint. Double vision and dizziness have gotten "some better". Left eye patch in place. Slept good thus far tonight. HRIR. Alfredo Martinez A

## 2020-09-16 NOTE — Progress Notes (Signed)
Patient ID: Curtis [name], male   DOB: 1985/07/26, 35 y.o.   MRN: [id number]  This SW covering for primary SW, Lavera Guise.  SW returned phone call to pt wife Curtis [name] to introduce self, explain role, and discuss discharge process. Wife was concerned about his therapies post d/c and prefers HH therapies because she has had to return to work, and caring for their 70-week old once off. Reports he will be home alone during the day. SW informed not aware pt husband's d/c needs since we do not have a d/c date at this time, and primary SW will f/u once aware on his care needs. SW provided contact information for [facility] transportation [phone] to call about transportation services.   SW spoke with [facility] transportation and informed transportation services are free. SW called pt wife Curtis [name] back to inform. She was aware already. SW reiterated once Curtis [name] is aware on his care needs this will be shared.   Pt assessed. See assessment for more details.   Cecile Sheerer, MSW, LCSWA Office: [phone] Cell: [phone] Fax: [phone]

## 2020-09-17 NOTE — Progress Notes (Signed)
Physical Therapy Session Note  Patient Details  Name: Curtis [name] MRN: [id number] Date of Birth: 1985/02/12  Today's Date: 09/17/2020 PT Individual Time: 1300-1415 PT Individual Time Calculation (min): 75 min   Short Term Goals: Week 1:  PT Short Term Goal 1 (Week 1): STG = LTG due to ELOS  Skilled Therapeutic Interventions/Progress Updates:  Pt sitting in recliner with father in room.   Gait training <>day room ~142ftx2 no UE support with min assist and intermittent mod assist for balance due to L anterior lean/LOB with decreased LLE step length/abrupt flat foot strike 2/2 decreased R weight shift during R stance phase - pt also noted to have slight R head tilt while ambulating.  Kinetron utilized for midline orientation training in standing CGA with BUE support and mirror in front for visual feedback. Pt progressed to no UE support an intermittent MinA/UE support 2/2 sudden left/anterior LOB. Several bouts ~1 minute prior to needing seated rest break 2/2 to brain/LE fatigue. Pt demonstrates ability to come to stand each time with good midline balance requiring slight adjustment of weight shifting towards right once standing. External cuing of right hip towards handrail to promote improved midline and weight shift towards right.   Toe taps onto 6" step to promote weight shifting towards the right and hip stabilization MaxA for maintain balance and facilitation of weight shift towards right that significantly improved to minA. 2x10 External cue given at right hip level to keep hip to hip with +2, patient able to improve weight shifting and not require external cue with later bouts. 1x10 with intermittent cuing for RLE toe tap to increase challenge. [facility] taps forward/lateral 2x12. ModA that progressed to [facility] for maintaining balance 2/2 to heavy left anterior lean with decreased right weight shifting. 1x~10 forward and ~25 degree angle towards the right. Noted pt demonstrated improved balance  and control with lateral cone tap. Changed directions to tap and increased difficulty to tapping both forward/lateral prior to resetting balance, requiring increased assist to maintain balance at first, then improving to minA.    Gait training with peripheral stimulation/finding numbers on sticky notes (1 round) and color circles (2 rounds) no UE support and CGA-minA. Pt demonstrates a few instances of self-monitoring and pausing to regain balance 2/2 heavy left anterior lean. 1 round of locating color pattern x6 colors with locating 4/6 in correct order. Several changes in directions, head turns, and one instance of lateral stepping including with activity. During last bout, two instances of requiring LUE hand to wall/door for balance correction 2/2 fatigue and left LOB.   Pt demonstrated significant improvement in dizziness sx stating no higher than 4/10 throughout session.     Bed mobility supervision. Pt supine in bed with HOB elevated, bed alarm on, call bell within reach. Father in room.   Therapy Documentation Precautions:  Precautions Precautions: Fall Precaution Comments: Wallenburg Syndrome. Nausea/dizziness, diplopia, rotating eye patch Restrictions Weight Bearing Restrictions: No  Pain: Pain Assessment Pain Scale: 0-10 Pain Score: 0-No pain Faces Pain Scale: No hurt   Therapy/Group: Individual Therapy  Mekaela Azizi, SPT 09/17/2020, 7:17 PM

## 2020-09-17 NOTE — IPOC Note (Signed)
Overall Plan of Care [facility]) Patient Details Name: Curtis [name] MRN: [id number] DOB: [birth year]  Admitting Diagnosis: Lateral medullary syndrome  Hospital Problems: Principal Problem:   Lateral medullary syndrome Active Problems:   ASD (atrial septal defect)     Functional Problem List: Nursing Pain, Endurance, Medication Management, Safety, Bowel  PT Balance, Endurance, Motor, Sensory  OT Balance, Endurance, Motor, Safety, Vision  SLP    TR         Basic ADL's: OT Grooming, Bathing, Dressing, Toileting     Advanced  ADL's: OT Simple Meal Preparation     Transfers: PT Bed Mobility, Bed to Chair, Car  OT Toilet, Tub/Shower     Locomotion: PT Ambulation, Stairs     Additional Impairments: OT Fuctional Use of Upper Extremity (pt reports still having difficultly with opening small containers/packages due to Lt coordination deficits)  SLP        TR      Anticipated Outcomes Item Anticipated Outcome  Self Feeding No goal  Swallowing      Basic self-care  Supervision-Mod I  Toileting  Mod I   Bathroom Transfers Supervision/setup-Mod I  Bowel/Bladder  manage bowel with mod I assist  Transfers  mod I with LRAD  Locomotion  supervision with LRAD  Communication     Cognition     Pain  at or below 4  Safety/Judgment  w cues/reminders   Therapy Plan: PT Intensity: Minimum of 1-2 x/day ,45 to 90 minutes PT Frequency: 5 out of 7 days PT Duration Estimated Length of Stay: 2 weeks OT Intensity: Minimum of 1-2 x/day, 45 to 90 minutes OT Frequency: 5 out of 7 days OT Duration/Estimated Length of Stay: 2-3 weeks     Due to the current state of emergency, patients may not be receiving their 3-hours of Medicare-mandated therapy.   Team Interventions: Nursing Interventions Patient/Family Education, Bowel Management, Pain Management, Discharge Planning, Medication Management, Disease Management/Prevention  PT interventions Ambulation/gait training,  Balance/vestibular training, Cognitive remediation/compensation, Community reintegration, Discharge planning, Disease management/prevention, DME/adaptive equipment instruction, Functional electrical stimulation, Functional mobility training, Neuromuscular re-education, Pain management, Patient/family education, Psychosocial support, Skin care/wound management, Splinting/orthotics, Stair training, Therapeutic Activities, Therapeutic Exercise, UE/LE Strength taining/ROM, Wheelchair propulsion/positioning, Visual/perceptual remediation/compensation, UE/LE Coordination activities  OT Interventions DME/adaptive equipment instruction, Warden/ranger, Patient/family education, Therapeutic Activities, Wheelchair propulsion/positioning, Psychosocial support, Therapeutic Exercise, UE/LE Strength taining/ROM, Self Care/advanced ADL retraining, Functional mobility training, Community reintegration, Discharge planning, Neuromuscular re-education, UE/LE Coordination activities, Visual/perceptual remediation/compensation, Pain management, Disease mangement/prevention  SLP Interventions    TR Interventions    SW/CM Interventions Discharge Planning, Psychosocial Support, Patient/Family Education   Barriers to Discharge MD  Medical stability  Nursing Decreased caregiver support, Home environment access/layout 1 level 3ste w spouse; pt works from home  PT Decreased caregiver support, Other (comments), Insurance for SNF coverage, Home environment access/layout 59 month old baby  OT Lack of/limited family support, Weight (limited support as wife has newborn to care for)    SLP      SW       Team Discharge Planning: Destination: PT-Home ,OT- Home , SLP-  Projected Follow-up: PT-Outpatient PT, 24 hour supervision/assistance, OT-  Home health OT, SLP-  Projected Equipment Needs: PT-To be determined, OT- To be determined, SLP-  Equipment Details: PT- , OT-  Patient/family involved in discharge planning:  PT- Patient,  OT-Patient, Family member/caregiver, SLP-   MD ELOS: 2 weeks Medical Rehab Prognosis:  Excellent Assessment: The patient has been admitted for CIR therapies with the  diagnosis of lateral medullary syndrome. The team will be addressing functional mobility, strength, stamina, balance, safety, adaptive techniques and equipment, self-care, bowel and bladder mgt, patient and caregiver education, NMR, community reentry. Goals have been set at supervision to mod I with mobility and self-care tasks.   Due to the current state of emergency, patients may not be receiving their 3 hours per day of Medicare-mandated therapy.    Curtis Oyster, MD, FAAPMR     See Team Conference Notes for weekly updates to the plan of care

## 2020-09-17 NOTE — Progress Notes (Signed)
Physical Therapy Session Note  Patient Details  Name: Curtis [name] MRN: [id number] Date of Birth: 03/20/1985  Today's Date: 09/17/2020 PT Individual Time: 1030-1130 PT Individual Time Calculation (min): 60 min   Short Term Goals: Week 1:  PT Short Term Goal 1 (Week 1): STG = LTG due to ELOS  Skilled Therapeutic Interventions/Progress Updates:    Pt received supine in bed, HOB partially elevated, with his father, Curtis [name], present and pt agreeable to therapy session. Pt reports he has had a bad morning with increased symptoms of dizziness - reports nausea medication administered around 6:30AM and dizziness medication administered around 7:30AM. Pt wearing eye patch over L eye at beginning of session - doffed during session to focus on coordinated eye movements to pt's tolerance (pt able to tolerate not wearing eye patch throughout entire session though would intermittently close one eye to stabilize his vision). Supine>sitting R EOB, HOB partially elevated, with supervision. Reports 6/10 dizziness in sitting with eyes closed EOB. Vitals assessed WNL - details below. L stand pivot EOB>w/c with min assist for balance due to L lean. Sitting in w/c donned shoes set-up assist. Pt reports he continues to have oscillopsia with vertical double vision -  pt reports that with head turn and horizontal eye movement to end range he is able to stabilize vision (no oscillopsia and no double vision). Educated pt on performing simple visual gaze fixation tasks in the room to improve coordinated eye movements without eye patch on.  Noticed that when pt provided back support in sitting he has almost immediate resolution of dizziness symptoms (this is key to provide rest breaks in this position when pt experiencing increased dizziness in order to diminish/dissipate symptoms).  Transported to/from gym in w/c for symptom management. Sitting in w/c without back support performed ambient processing training via having pt  focus vision forward while therapist tossed colored bean bags in from the side having pt identify the color prior to catching them. Progressed to this task in standing with CGA for steadying with increased dizziness compared to in sitting.  Gait training ~12ft no UE support with min assist (+2 close by for safety but not needed) and intermittent mod assist for balance due to L anterior lean/LOB with decreased R weight shift during R stance phase - pt also noted to have slight R head tilt while ambulating.   Performed visual midline awareness screen via bringing an object towards midline from all 4 directions and having pt state when it is at midline - noticed possible slight L midline shift.   Gait training ~117ft back towards room with B HHA to increased input for stabilization due to pt having increased dizziness symptoms as this time - some decreased L anterior lean/LOB with B HHA compared to no UE support but otherwise same gait as above. Reinforced education on importance of increasing time in upright supported sitting during the day. Pt left seated in recliner with needs in reach and his father present.    Sitting: BP 128/83 (MAP 93), HR 101bpm  Standing: BP 121/78 (MAP 90), HR 117bpm   Therapy Documentation Precautions:  Precautions Precautions: Fall Precaution Comments: Wallenburg Syndrome. Nausea/dizziness, diplopia, rotating eye patch Restrictions Weight Bearing Restrictions: No   Pain: No reports of pain throughout session.   Therapy/Group: Individual Therapy  Curtis [name] , PT, DPT, NCS, CSRS  09/17/2020, 12:21 PM

## 2020-09-17 NOTE — Progress Notes (Signed)
PROGRESS NOTE   Subjective/Complaints: Patient seen in occupational therapy.  He does not have much movement related vestibular dysfunction.  Has more when he is un patched and looking forward.  No abdominal pain no vomiting.  Review of systems negative chest pain shortness of breath. + nausea , neg V/D  Objective:   No results found. Recent Labs    09/16/20 0627  WBC 13.0*  HGB 16.5  HCT 48.4  PLT 250    Recent Labs    09/16/20 0627  NA 139  K 4.9  CL 103  CO2 30  GLUCOSE 92  BUN 9  CREATININE 0.91  CALCIUM 9.2     Intake/Output Summary (Last 24 hours) at 09/17/2020 0836 Last data filed at 09/16/2020 1841 Gross per 24 hour  Intake 472 ml  Output --  Net 472 ml         Physical Exam: Vital Signs Blood pressure 121/85, pulse 97, temperature 97.9 F (36.6 C), temperature source Oral, resp. rate 18, height 6' (1.829 m), weight (!) 139.2 kg, SpO2 94 %.  General: No acute distress Mood and affect are appropriate Heart: Regular rate and rhythm no rubs murmurs or extra sounds Lungs: Clear to auscultation, breathing unlabored, no rales or wheezes Abdomen: Positive bowel sounds, soft nontender to palpation, nondistended Extremities: No clubbing, cyanosis, or edema Skin: No evidence of breakdown, no evidence of rash Musculoskeletal: Full range of motion in all 4 extremities. No joint swelling  Neurologic: Mild skew deviation, cranial nerve IV, motor strength is 5/5 in bilateral deltoid, bicep, tricep, grip, hip flexor, knee extensors, ankle dorsiflexor and plantar flexor Sensory exam normal sensation to light touch  in bilateral upper and lower extremities Cerebellar exam mild dysmetria left finger-nose-finger Musculoskeletal: Full range of motion in all 4 extremities. No joint swelling Extraocular muscles intact Assessment/Plan: 1. Functional deficits which require 3+ hours per day of interdisciplinary  therapy in a comprehensive inpatient rehab setting. Physiatrist is providing close team supervision and 24 hour management of active medical problems listed below. Physiatrist and rehab team continue to assess barriers to discharge/monitor patient progress toward functional and medical goals  Care Tool:  Bathing    Body parts bathed by patient: Right arm, Left arm         Bathing assist Assist Level: Minimal Assistance - Patient > 75%     Upper Body Dressing/Undressing Upper body dressing   What is the patient wearing?: Pull over shirt    Upper body assist Assist Level: Set up assist    Lower Body Dressing/Undressing Lower body dressing      What is the patient wearing?: Pants, Underwear/pull up     Lower body assist Assist for lower body dressing: Minimal Assistance - Patient > 75%     Toileting Toileting Toileting Activity did not occur (Clothing management and hygiene only): N/A (no void or bm)  Toileting assist Assist for toileting: Minimal Assistance - Patient > 75%     Transfers Chair/bed transfer  Transfers assist     Chair/bed transfer assist level: Minimal Assistance - Patient > 75% Chair/bed transfer assistive device: Armrests   Locomotion Ambulation   Ambulation assist  Assist level: Minimal Assistance - Patient > 75% Assistive device: Walker-rolling Max distance: 121ft   Walk 10 feet activity   Assist     Assist level: Minimal Assistance - Patient > 75% Assistive device: Walker-rolling   Walk 50 feet activity   Assist    Assist level: Minimal Assistance - Patient > 75% Assistive device: Walker-rolling    Walk 150 feet activity   Assist    Assist level: Minimal Assistance - Patient > 75% Assistive device: Walker-rolling    Walk 10 feet on uneven surface  activity   Assist Walk 10 feet on uneven surfaces activity did not occur: Safety/medical concerns         Wheelchair     Assist Is the patient using a  wheelchair?: No   Wheelchair activity did not occur: N/A         Wheelchair 50 feet with 2 turns activity    Assist    Wheelchair 50 feet with 2 turns activity did not occur: N/A       Wheelchair 150 feet activity     Assist  Wheelchair 150 feet activity did not occur: N/A       Blood pressure 121/85, pulse 97, temperature 97.9 F (36.6 C), temperature source Oral, resp. rate 18, height 6' (1.829 m), weight (!) 139.2 kg, SpO2 94 %.  Medical Problem List and Plan: 1.  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  Antithrombotics: -DVT/anticoagulation: SCDs               -antiplatelet therapy: Aspirin 81 mg daily and Plavix 75 mg daily x3 months then repeat vascular imaging to determine need to continue DAPT 3. Pain Management: Neurontin 300 mg 3 times daily, Tylenol 3 as needed             -tylenol has generally been effective for intermittent headaches 4. Mood: Provide emotional support             -antipsychotic agents: N/A 5. Neuropsych: This patient is capable of making decisions on his own behalf. 6. Skin/Wound Care: Routine skin checks 7. Fluids/Electrolytes/Nutrition: Routine in and outs with follow-up chemistries on admit 8.  Obesity.  BMI 36.62.  Dietary changes shall be discussed 9.  PFO noted on transthoracic echo, right to left shunt noted.  Unlikely cause of current stroke no further w/u per neuro, may f/u with cardiology as OP  10.  CN 4 palsy left , expect this to improve as it is outside of the main infarct location  LOS: 4 days A FACE TO FACE EVALUATION WAS PERFORMED  Erick Colace 09/17/2020, 8:36 AM

## 2020-09-17 NOTE — Progress Notes (Signed)
Occupational Therapy Session Note  Patient Details  Name: Curtis [name] MRN: [id number] Date of Birth: [birth year]  Today's Date: 09/17/2020 OT Individual Time: 5400-8676 OT Individual Time Calculation (min): 74 min    Short Term Goals: Week 1:  OT Short Term Goal 1 (Week 1): Pt will complete an ambulatory toilet transfer using RW with no more than Min A OT Short Term Goal 2 (Week 1): Pt will complete LB dressing at sit<stand level with no more than CGA OT Short Term Goal 3 (Week 1): Pt will complete 2 grooming tasks while standing at the sink while independently utilizing 2 compensatory strategies to manage symptoms of dizzyness/nausea  Skilled Therapeutic Interventions/Progress Updates:    Pt in bed to start session with reports of dizziness at 5-6/10 and worse in the am compared to yesterday during therapy.  He reports having more dizziness and nausea when he first wakes up.  Scheduling notified to schedule after 9:00 am to see if this makes a difference.  Left eye was patched at this time to help with decreasing his dizziness.  He was able to transfer to the EOB with supervision and then donn his shoes with setup.  Dizziness remained at 5-6 with transition over to the sink to stand and complete oral hygiene.  Once complete, he ambulated down to the therapy gym with use of the RW and min to min guard.  Increased right lean noted with wider BOS and decreased efficiency with advancing the RLE.  No increase in dizziness from 5-6 baseline during mobility until he reached the gym and turned to the right to transfer to the mat.  It then increased to a 7-8 and remained for approximately 30 seconds before settling down  Had him work on convergence exercises with use of the [employer].  He was able to fuse at approximately 6" from his nose and maintain for short durations of 15 seconds or less before eye fatigue.  He would frequently close his eyes for relief from the dizziness as well as to reset.  He  could progress to fusion at 8-9" but no further.  Increased neck pain reported as well with some increased pain in the left forehead.  Transitioned to standing next with focus on reaching different directions to pick up and retrieve a therapy ball with BUEs.  Min assist for balance with reaching to the floor and overhead.  No increase in dizziness with these movements.  With head rotation and reaching to the right, his dizziness increased to an 8/10 and he needed to sit down to rest.  Had him also transition to supine for relief as well with return to level 5 in supine.  Had him turn his head to the left with increase in dizziness to 6-7/10 and then to the right where it did not increase an stayed 5/10.  He transitioned back to sitting with supervision and attempted to stand again with min assist and head at midline.  Dizziness increased again with standing up to 8/10 and he had to sit back down and then transition to supine in long sitting with legs off of the mat for relief.  After resting, he was able to transfer squat pivot to the wheelchair with min guard and was pushed back to the room to rest in bed.  No increase in dizziness when pushed in the wheelchair.  He reports it feels better.  Min assist for transfer to the bed with call button and phone in reach with safety  alarm in place.   Therapy Documentation Precautions:  Precautions Precautions: Fall Precaution Comments: Wallenburg Syndrome. Nausea/dizziness, diplopia, rotating eye patch Restrictions Weight Bearing Restrictions: No   Pain: Pain Assessment Pain Scale: Faces Pain Score: 0-No pain ADL: See Care Tool Section for some details of mobility and selfcare   Therapy/Group: Individual Therapy  Blaze Sandin OTR/L 09/17/2020, 10:02 AM

## 2020-09-18 MED ORDER — METHOCARBAMOL 750 MG PO TABS
750.0000 mg | ORAL_TABLET | Freq: Three times a day (TID) | ORAL | Status: DC | PRN
  Administered 2020-09-18 – 2020-09-26 (×9): 750 mg via ORAL
  Filled 2020-09-18 (×9): qty 1

## 2020-09-18 MED ORDER — LIDOCAINE 5 % EX PTCH
1.0000 | MEDICATED_PATCH | CUTANEOUS | Status: DC
  Administered 2020-09-18 – 2020-09-25 (×8): 1 via TRANSDERMAL
  Filled 2020-09-18 (×8): qty 1

## 2020-09-18 MED ORDER — ONDANSETRON HCL 4 MG PO TABS
4.0000 mg | ORAL_TABLET | Freq: Every day | ORAL | Status: DC
  Administered 2020-09-19 – 2020-09-26 (×8): 4 mg via ORAL
  Filled 2020-09-18 (×11): qty 1

## 2020-09-18 NOTE — Progress Notes (Signed)
Patient ID: Curtis [name], male   DOB: Aug 05, 1985, 35 y.o.   MRN: [id number]  This SW covering for primary SW, Erlene Quan.  SW met with pt and pt wife in room to provide updates from team conference, and d/c date 9/1. Pt wife clarified that she will be home through October when she has to return to work. States that once she returns to work, even though she works from home, they will make accommodations, and pt can stay with his mother as well. Family edu scheduled for Monday (8/29) 1pm-3pm. Reports pt will be able to make it [place] for therapies. Will send referral here.   Loralee Pacas, MSW, [place] Office: [phone] Cell: [phone] Fax: [phone]

## 2020-09-18 NOTE — Progress Notes (Signed)
Physical Therapy Session Note  Patient Details  Name: Curtis [name] MRN: [id number] Date of Birth: 10-Mar-1985  Today's Date: 09/18/2020 PT Individual Time: 0923-1032 PT Individual Time Calculation (min): 69 min   Short Term Goals: Week 1:  PT Short Term Goal 1 (Week 1): STG = LTG due to ELOS  Skilled Therapeutic Interventions/Progress Updates:    Pt received supine in bed and agreeable to therapy session. States this morning was "better than yesterday" with pt currently utilizing peppermint essential oil for symptom management. Pt wearing eye patch on L eye upon arrival stating he has to wear it in order to see the TV screen -  agreeable to not wearing it during session - pt would continue to intermittent close 1 eye (noted to typically close R eye) when needing improved gaze stabilization during certain tasks throughout session.  Supine>sitting R EOB supervision. Sitting EOB donned tennis shoes set-up assist. Rates dizziness at 6/10 to start session in sitting. Sit<>stands using RW with CGA and without AD requiring intermittent light min assist due to sudden minor L anterior LOB once in standing. Gait training ~143ft 2x using RW to/from day room with CGA/intermittent light min assist for balance with pt able to compensate for L lean/LOB by using UE support on AD.   Pt reports feeling as though he needs to "crack" his neck - therapist provided education on performing gentle active ROM reps in unilateral directions (only lateral flexion or only rotation) and avoiding combination movements (ex: flexion with rotation).   Dynamic standing balance via L LE foot taps forward, L lateral, and cross body to cones focusing on sustained R weight shift with pt requiring intermittent min assist for balance recovery, which was a significant improvement compared to yesterday.   Dynamic gait training, no AD, with dual-visual scanning task of stepping over 2 bars and 2 hurdles while visually scanning  R/L to  locate and call out numbers randomly placed around the gym - requires min assist for balance with pt having poor timing of steps over obstacles (possibly due to visual disturbances making it difficult for pt to determine distance from the object). Pt reports strong increase in his symptoms to 7-8/10. Requires prolonged seated rest break with therapist providing 7lb weight to pt's shoulders to promote increased proprioceptive input/support for symptom management.   During seated rest break therapist provided soft tissue mobilization to L upper trapezius and supraspinatus as pt noted to have decreased tissue extensibility.   Dynamic gait training ~161ft working on lateral stepping, backwards walking, and sudden directional changes all without AD and min assist for balance - pt had improved forward ambulation with increased R weight shift during stance resulting in decreased L lean/LOB compared to prior to those dynamic challenges. Progressed to adding multi-task targeting L UE NMR via having to bend down and pick up objects of varying size, shape, and weight (up to 4lbs) all with min assist for balance. Pt continued to benefit from supported seated rest breaks in a chair for symptom management.   Dynamic standing balance via sustained L foot propped up on 6" step while performing L UE NMR task of tossing bean bags to cornhole board - pt initially unable to place L foot on step and maintain balance during R stance but on 2nd and 3rd trials able to do with only light min assist.   At end of session pt left seated in recliner with needs in reach.     Therapy Documentation Precautions:  Precautions Precautions:  Fall Precaution Comments: Wallenburg Syndrome. Nausea/dizziness, diplopia, rotating eye patch Restrictions Weight Bearing Restrictions: No   Pain:  Reports headache pain/discomfort and increased tension in L upper trapezius - soft tissue mobilization provided.   Therapy/Group: Individual  Therapy  Ginny Forth , PT, DPT, NCS, CSRS 09/18/2020, 7:50 AM

## 2020-09-18 NOTE — Progress Notes (Addendum)
Occupational Therapy Session Note  Patient Details  Name: Curtis [name] MRN: [id number] Date of Birth: December 06, 1985  Today's Date: 09/18/2020 OT Individual Time: 2595-6387 OT Individual Time Calculation (min): 57 min    Short Term Goals: Week 1:  OT Short Term Goal 1 (Week 1): Pt will complete an ambulatory toilet transfer using RW with no more than Min A OT Short Term Goal 2 (Week 1): Pt will complete LB dressing at sit<stand level with no more than CGA OT Short Term Goal 3 (Week 1): Pt will complete 2 grooming tasks while standing at the sink while independently utilizing 2 compensatory strategies to manage symptoms of dizzyness/nausea  Skilled Therapeutic Interventions/Progress Updates:    Session 1: (5643-3295)  Pt sitting in the recliner to start session.  He reports dizziness at 4/5 in sitting, which is much improved from the am yesterday.  He ambulated to the therapy gym with min assist using the RW for support.  Increased weightshift and lean to the left was noted with mobility but no increase in dizziness above a 5/10.  Had him sit supported in chair with arms and back to help ground him and decreased dizziness and unsteadiness.  He was able to participate in simple convergence exercise with Luvenia Redden.  He tends to only being able to report fusion at around 6" and this is intermittent as it will double.  No increase in dizziness noted during task.  Transitioned to standing task with use of the rebounder and 2lb ball.  He was able to toss and catch it consistently with both hands facing it as well as with turning right and left.  Head turns incorporated when standing to the side and tossing and catching it with a slight increase in dizziness when turning head from right back to midline but still only up to a 7/10.  Min assist needed for balance overall without UE use.  Transitioned to use of a smaller 1 lb ball and having to catch and toss it single handed.  No drops noted with dizziness  increasing only to 6/10 when turning to each side.  Finished session with standing and head movements left to right with min guard for balance and no increase in dizziness.  Also completed X2 vestibular exercise in sitting with head movements opposite of eyes and target, but this did not increase dizziness.  Finished session with ambulation back to the room with overall mod assist and no assistive device.  Increased LOB to the left noted secondary to decreased right weightshift.  Pt left in recliner with the call button and phone in reach and his spouse present.    Session 2: ([id number])  Pt transferred to the EOB with supervision to start session and donned his shoes at the same level.  He reported dizziness at 4/10 to start with functional mobility down to the dayroom without an assistive device and min assist.  Increased lean to the left with decreased adequate weightshift to the right at times for normal step length on the left.  No increase in dizziness during ambulation to the dayroom.  Worked on standing balance with weightshifts to the right while tapping and placing the LLE up on 5" step.  Min assist needed for balance.  Had him stand with wall on the right side as well to give him a target to shift his hips and right shoulder too, as well as making him feel secure.  Increased difficulty shifting weight to the right and maintaining.  Transitioned  to functional mobility without an assistive device at min assist level but with improved balance noted after working on weightshifts.  Had him progress to walking with head turns as well and min assist with dizziness only increasing up to 5/10.  Finished session with return to the room and transfer to the bed.  Call button and phone in reach with safety alarm in place.     Therapy Documentation Precautions:  Precautions Precautions: Fall Precaution Comments: Wallenburg Syndrome. Nausea/dizziness, diplopia, rotating eye patch Restrictions Weight Bearing  Restrictions: No   Pain: Pain Assessment Pain Scale: Faces Pain Score: 0-No pain ADL: See Care Tool Section for some details of mobility and selfcare  Therapy/Group: Individual Therapy  Abdou Stocks OTR/L 09/18/2020, 12:17 PM

## 2020-09-18 NOTE — Progress Notes (Signed)
Occupational Therapy Session Note  Patient Details  Name: Curtis [name] MRN: [id number] Date of Birth: 03-23-1985  Today's Date: 09/18/2020 OT Individual Time: 1349-1415 OT Individual Time Calculation (min): 26 min    Short Term Goals: Week 1:  OT Short Term Goal 1 (Week 1): Pt will complete an ambulatory toilet transfer using RW with no more than Min A OT Short Term Goal 2 (Week 1): Pt will complete LB dressing at sit<stand level with no more than CGA OT Short Term Goal 3 (Week 1): Pt will complete 2 grooming tasks while standing at the sink while independently utilizing 2 compensatory strategies to manage symptoms of dizzyness/nausea  Skilled Therapeutic Interventions/Progress Updates:    Treatment session with focus on visual motor and visual compensation with peg board activity in standing.  Pt completed bed mobility and sit > stand with CGA and maintained standing balance during task with CGA - close supervision.  Therapist directed pt to complete peg board pattern without eyes occluded to focus on visual motor.  Pt reports seeing multiple colored dots "stacked on top of each other".  Pt utilizing L hand to keep place in pattern to compensate for stacked and jumping pattern.  Reports able to see pattern picture in far R and L visual field but peg board at midline was stacked.  Pt completed 50% of task without eyes occluded and remainder with L eye covered.  Therapist increased challenge to pattern replication with legend key below to decipher colors.  Pt demonstrating increased success with this task without eyes occluded, demonstrating ability to compensate and use of pattern recognition and linear scanning.  Pt with 10% errors but able to correct with min cues.  Pt returned to supine and left with all needs in reach.    Therapy Documentation Precautions:  Precautions Precautions: Fall Precaution Comments: Wallenburg Syndrome. Nausea/dizziness, diplopia, rotating eye  patch Restrictions Weight Bearing Restrictions: No  Pain: Pain Assessment Pain Scale: Faces Pain Score: 0-No pain   Therapy/Group: Individual Therapy  Rosalio Loud 09/18/2020, 3:29 PM

## 2020-09-18 NOTE — Patient Care Conference (Signed)
Inpatient RehabilitationTeam Conference and Plan of Care Update Date: 09/18/2020   Time: 10:38 AM    Patient Name: Curtis [name]      Medical Record Number: [id number]  Date of Birth: 20-Apr-1985 Sex: Male         Room/Bed: 4W07C/4W07C-01 Payor Info: Payor: BLUE CROSS BLUE SHIELD / Plan: BCBS COMM PPO / Product Type: *No Product type* /    Admit Date/Time:  09/13/2020 12:48 PM  Primary Diagnosis:  Lateral medullary syndrome  Hospital Problems: Principal Problem:   Lateral medullary syndrome Active Problems:   ASD (atrial septal defect)    Expected Discharge Date: Expected Discharge Date: 09/26/20  Team Members Present: Physician leading conference: Dr. Sula Soda Social Worker Present: Cecile Sheerer, LCSWA Nurse Present: Chana Bode, RN PT Present: Casimiro Needle, PT OT Present: Perrin Maltese, OT PPS Coordinator present : Fae Pippin, SLP     Current Status/Progress Goal Weekly Team Focus  Bowel/Bladder   Continent of B/B LBM-8/23  Remain continent of B/B  Assess B/B every shift and assist with toileting needs   Swallow/Nutrition/ Hydration             ADL's   Supervision for UB selfcare with min guard for LB selfcare simulated.  Transfers min assist with RW and min without.  LUE ataxia present but uses functionally at a diminshed level.  Increased dizziness at rest as well as with positional changes.  Diiplopia still present in all fields.  supervision to modified independent  selfcare retraining, transfer training, neuromuscular re-education, balance retraining, therapeutic activities, pt/family education, vision retraining, vestibular retraining   Mobility   supervision bed mobility, CGA sit<>stands, min assist stand pivot transfers without AD, gait up to 123ft using RW with min assist but requries up to mod assist without AD due to L anterior lean/LOB - continues to have vertical diplopia and oscillopsia with increased symptoms of dizziness  mod-I to  supervision overall at ambulatory level  activity tolerance, visual retraining with dual-task of balance or gait, dynamic gait training, dynamic standing balance with focus on R weight shift, midline reorientation, pt education, L hemibody NMR   Communication             Safety/Cognition/ Behavioral Observations            Pain   Reports intermittent headache-requested prn tylenol with codeine-effective rates 7/10  Pain <3/10  Assess and address pain every shift and prn   Skin   Ecchymosis noted legs and arms, no open areas  No further skin issues  Assess skin every shift and prn     Discharge Planning:  D/c to home with intermittent support since wife has returned to work.   Team Discussion: Lateral medullary syndrome and cranial nerve palsy. MD monitoring; avoiding flexeril, trying trigger point therapy and lidocaine patch. Follow up OP for TEE. Plan wean meclizine to see what symptoms do without medications and treat nausea; symptoms worse in the morning. Patient also has visual tracking issues, dizziness and resting nystagmus; patch trial with improvement of dizziness.  Patient on target to meet rehab goals: yes, currently CGA for sit -to-stand without and assistive device. Able to manage 300' with CGA and intermittent assist for anterior loss of balance and left lean. Requires supervision for upper body care and min guard for lower body care. Completes stand pivot with min assist and using a rolling walker. Goals for discharge set at mod I overall with some supervision level goals.  *See Care Plan and progress  notes for long and short-term goals.   Revisions to Treatment Plan:  OT testing and addressing atypical central vestibular disorder    Teaching Needs: Safety, medications, transfers, etc.   Current Barriers to Discharge: Decreased caregiver support and Home enviroment access/layout  Possible Resolutions to Barriers: OP follow up recommended Family education with wife      Medical Summary Current Status: lateral medullary syndrome, left sided cranial nerve 4 palsy, cervical myofasical pain, PFO on TTE, obesity (BMI 41.62), dizziness, resting nystagmus  Barriers to Discharge: Weight;Medical stability  Barriers to Discharge Comments: lateral medullary syndrome, left sided cranial nerve 4 palsy, cervical myofasical pain, PFO on TTE, obesity (BMI 41.62), dizziness, resting nystagmus Possible Resolutions to [employer] Focus: avoid flexeril, plan for trigger points and lidoderm patch for myofasical pain, schedule zofran in AM for nausea, cardiology outpatient follow-up   Continued Need for Acute Rehabilitation Level of Care: The patient requires daily medical management by a physician with specialized training in physical medicine and rehabilitation for the following reasons: Direction of a multidisciplinary physical rehabilitation program to maximize functional independence : Yes Medical management of patient stability for increased activity during participation in an intensive rehabilitation regime.: Yes Analysis of laboratory values and/or radiology reports with any subsequent need for medication adjustment and/or medical intervention. : Yes   I attest that I was present, lead the team conference, and concur with the assessment and plan of the team.   Chana Bode B 09/18/2020, 2:29 PM

## 2020-09-18 NOTE — Progress Notes (Signed)
PROGRESS NOTE   Subjective/Complaints:  Pt reports L neck is still very painful, "killing him"  stiff and tight- which is what caused his stroke in the first place, but wants to treat it.  Dizzy/nauseated in AM- waits to start therapy to 9am as a result Wearing R eye patch.     Review of systems  Pt denies SOB, abd pain, CP, N/V/C/D, and vision changes   Objective:   No results found. Recent Labs    09/16/20 0627  WBC 13.0*  HGB 16.5  HCT 48.4  PLT 250   Recent Labs    09/16/20 0627  NA 139  K 4.9  CL 103  CO2 30  GLUCOSE 92  BUN 9  CREATININE 0.91  CALCIUM 9.2    Intake/Output Summary (Last 24 hours) at 09/18/2020 0856 Last data filed at 09/17/2020 1950 Gross per 24 hour  Intake 120 ml  Output --  Net 120 ml        Physical Exam: Vital Signs Blood pressure 116/80, pulse 81, temperature 98 F (36.7 C), temperature source Oral, resp. rate 18, height 6' (1.829 m), weight (!) 139.2 kg, SpO2 97 %.   General: awake, alert, appropriate, sitting up in bedside chair; NAD HENT: R eye patch- wearing eyeglasses CV: regular rate; no JVD Pulmonary: CTA B/L; no W/R/R- good air movement GI: soft, NT, ND, (+)BS Psychiatric: appropriate Neurological: Ox3  Neurologic: Mild skew deviation, cranial nerve IV, motor strength is 5/5 in bilateral deltoid, bicep, tricep, grip, hip flexor, knee extensors, ankle dorsiflexor and plantar flexor Sensory exam normal sensation to light touch  in bilateral upper and lower extremities Cerebellar exam mild dysmetria left finger-nose-finger Musculoskeletal: Very tight in L upper traps, levators and scalenes   Assessment/Plan: 1. Functional deficits which require 3+ hours per day of interdisciplinary therapy in a comprehensive inpatient rehab setting. Physiatrist is providing close team supervision and 24 hour management of active medical problems listed below. Physiatrist and  rehab team continue to assess barriers to discharge/monitor patient progress toward functional and medical goals  Care Tool:  Bathing    Body parts bathed by patient: Right arm, Left arm         Bathing assist Assist Level: Minimal Assistance - Patient > 75%     Upper Body Dressing/Undressing Upper body dressing   What is the patient wearing?: Pull over shirt    Upper body assist Assist Level: Set up assist    Lower Body Dressing/Undressing Lower body dressing      What is the patient wearing?: Pants, Underwear/pull up     Lower body assist Assist for lower body dressing: Minimal Assistance - Patient > 75%     Toileting Toileting Toileting Activity did not occur (Clothing management and hygiene only): N/A (no void or bm)  Toileting assist Assist for toileting: Minimal Assistance - Patient > 75%     Transfers Chair/bed transfer  Transfers assist     Chair/bed transfer assist level: Minimal Assistance - Patient > 75% Chair/bed transfer assistive device: Armrests   Locomotion Ambulation   Ambulation assist      Assist level: Moderate Assistance - Patient 50 - 74% Assistive device: No  Device Max distance: 135ft   Walk 10 feet activity   Assist     Assist level: Minimal Assistance - Patient > 75% Assistive device: No Device   Walk 50 feet activity   Assist    Assist level: Minimal Assistance - Patient > 75% Assistive device: No Device    Walk 150 feet activity   Assist    Assist level: Minimal Assistance - Patient > 75% Assistive device: Walker-rolling    Walk 10 feet on uneven surface  activity   Assist Walk 10 feet on uneven surfaces activity did not occur: Safety/medical concerns         Wheelchair     Assist Is the patient using a wheelchair?: No   Wheelchair activity did not occur: N/A         Wheelchair 50 feet with 2 turns activity    Assist    Wheelchair 50 feet with 2 turns activity did not occur:  N/A       Wheelchair 150 feet activity     Assist  Wheelchair 150 feet activity did not occur: N/A       Blood pressure 116/80, pulse 81, temperature 98 F (36.7 C), temperature source Oral, resp. rate 18, height 6' (1.829 m), weight (!) 139.2 kg, SpO2 97 %.  Medical Problem List and Plan: 1.  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  Antithrombotics: -DVT/anticoagulation: SCDs               -antiplatelet therapy: Aspirin 81 mg daily and Plavix 75 mg daily x3 months then repeat vascular imaging to determine need to continue DAPT 3. Pain Management: Neurontin 300 mg 3 times daily, Tylenol 3 as needed             -tylenol has generally been effective for intermittent headaches  8/24- will try Robaxin 750 mg q8 hours prn- and lidoderm patch on L side of neck- 8pm to 8am- if not effective, will try Trigger point injections tomorrow 4. Mood: Provide emotional support             -antipsychotic agents: N/A  8/24- mood is stable, but would likely benefit from stroke support group after d/c.  5. Neuropsych: This patient is capable of making decisions on his own behalf. 6. Skin/Wound Care: Routine skin checks 7. Fluids/Electrolytes/Nutrition: Routine in and outs with follow-up chemistries on admit 8.  Obesity.  BMI 36.62.  Dietary changes shall be discussed 9.  PFO noted on transthoracic echo, right to left shunt noted.  Unlikely cause of current stroke no further w/u per neuro, may f/u with cardiology as OP  10.  CN 4 palsy left , expect this to improve as it is outside of the main infarct location   LOS: 5 days A FACE TO FACE EVALUATION WAS PERFORMED  Curtis [name] 09/18/2020, 8:56 AM

## 2020-09-18 NOTE — Progress Notes (Signed)
Inpatient Rehabilitation Care Coordinator Assessment and Plan Patient Details  Name: Curtis [name] MRN: [id number] Date of Birth: 1985-07-18  Today's Date: 09/18/2020  Hospital Problems: Principal Problem:   Lateral medullary syndrome Active Problems:   ASD (atrial septal defect)  Past Medical History: History reviewed. No pertinent past medical history. Past Surgical History: History reviewed. No pertinent surgical history. Social History:  reports that he has been smoking cigars. He has never used smokeless tobacco. He reports current alcohol use of about 2.0 standard drinks per week. He reports that he does not use drugs.  Family / Support Systems Marital Status: Married How Long?: almost 1 yr Patient Roles: Spouse, Parent Spouse/Significant Other: [name] (wife) Children: 36 week old daughter Other Supports: None Anticipated Caregiver: wife Ability/Limitations of Caregiver: Wife has returned to work and will only be available in the evening. Caregiver Availability: 24/7 Family Dynamics: Pt will d/c to home with his wife.  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 - How often do you need to have someone help you when you read instructions, pamphlets, or other written material from your doctor or pharmacy?: Never Writes: Yes Employment Status: Employed Return to Work Plans: TBD Public relations account executive Issues: Denies Guardian/Conservator: N/A   Abuse/Neglect Abuse/Neglect Assessment Can Be Completed: Yes Physical Abuse: Denies Verbal Abuse: Denies Sexual Abuse: Denies Exploitation of patient/patient's resources: Denies Self-Neglect: Denies  Patient response to: Social Isolation - How often do you feel lonely or isolated from those around you?: Never  Emotional Status Pt's affect, behavior and adjustment status: Pt in good spirits at time of visit Recent Psychosocial Issues:  Denies Psychiatric History: Denies Substance Abuse History: Denies  Patient / Family Perceptions, Expectations & Goals Pt/Family understanding of illness & functional limitations: Pt and family have a general understanding of pt care needs Premorbid pt/family roles/activities: Independent Anticipated changes in roles/activities/participation: Assistance with ADLs/IADls Pt/family expectations/goals: Pt goal is "being able to walk."  [employer]: None Premorbid Home Care/DME Agencies: None Transportation available at discharge: Wife Is the patient able to respond to transportation needs?: Yes In the past 12 months, has lack of transportation kept you from medical appointments or from getting medications?: No In the past 12 months, has lack of transportation kept you from meetings, work, or from getting things needed for daily living?: No Resource referrals recommended: Neuropsychology  Discharge Planning Living Arrangements: Spouse/significant other, Children Support Systems: Spouse/significant other, Parent Type of Residence: Private residence Insurance Resources: Multimedia programmer (specify) Nurse, mental health) Financial Resources: Employment, Secondary school teacher Screen Referred: No Living Expenses: Medical laboratory scientific officer Management: Patient, Spouse Does the patient have any problems obtaining your medications?: No Home Management: Both he and his wife managed home care needs Patient/Family Preliminary Plans: TBD Care Coordinator Barriers to Discharge: Decreased caregiver support, Lack of/limited family support Care Coordinator Anticipated Follow Up Needs: HH/OP  Clinical Impression This SW covering for primary SW, [employer].   SW met with pt and pt father in room to complete assessment. SW introduced self, explained role and discussed discharge process. 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 No DME. Pt intends to find his own PCP. Pt is aware SW to follow-up with his  wife.  1259-SW spoke with pt wife to introduce self, explain role, discharge process, and primary SW will f/u to give continued updates. Wife reports that she will be going back to work early and pt will be home alone. SW informed will gets updates on team conference  to determine his care needs.   Hannibal Skalla A Ayrabella Labombard 09/18/2020, 3:41 PM

## 2020-09-19 ENCOUNTER — Inpatient Hospital Stay (HOSPITAL_COMMUNITY): Payer: BC Managed Care – PPO

## 2020-09-19 IMAGING — CT CT HEAD W/O CM
4 series · 16 of 47 positions shown, 18 images · non-contrast
Comparison: [DATE]

CLINICAL DATA: Left eye pain and

EXAM:
CT HEAD WITHOUT CONTRAST
TECHNIQUE: Contiguous axial images were obtained from the base of the skull
through the vertex without intravenous contrast.

[Series 3: head wo · axial · 0.47mm/px · z∈[-224,-94]mm · 7 of 36 slices shown, 9 images]
[im 5/36  brain]
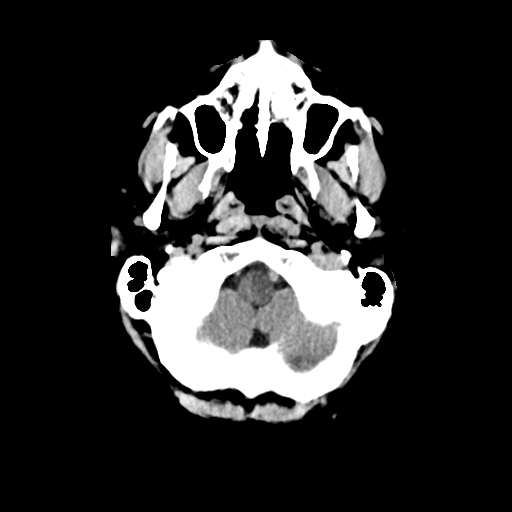
[im 5/36  bone]
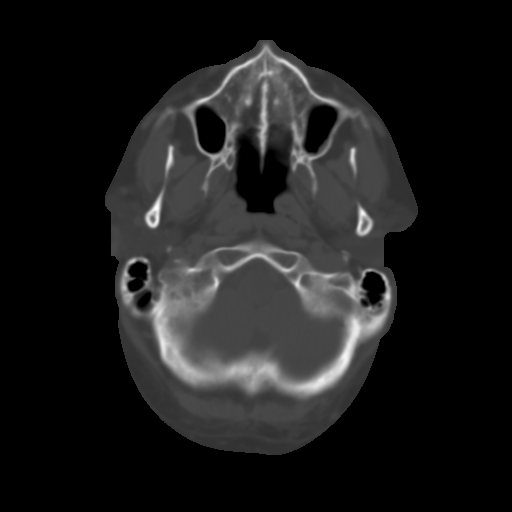
[im 9/36  brain]
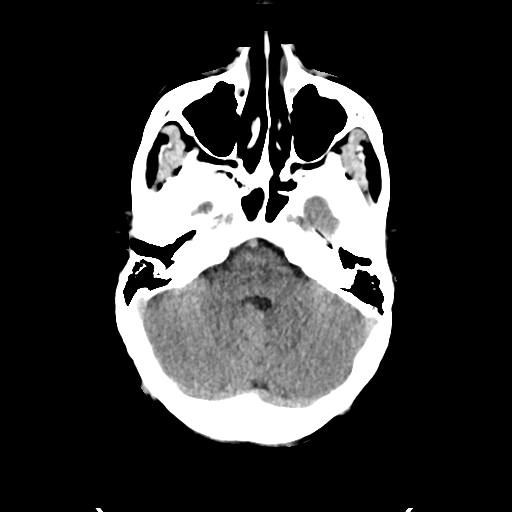
[im 14/36  brain]
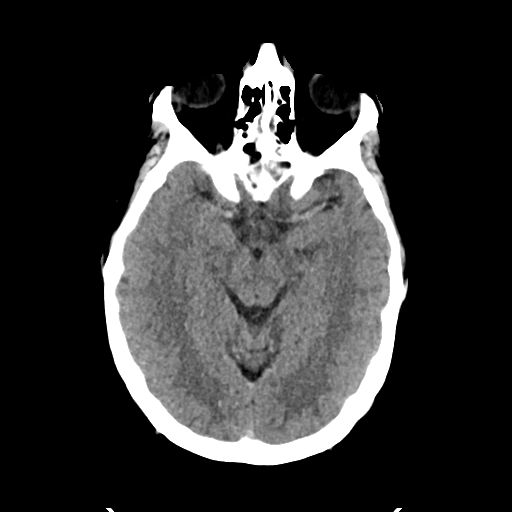
[im 18/36  brain]
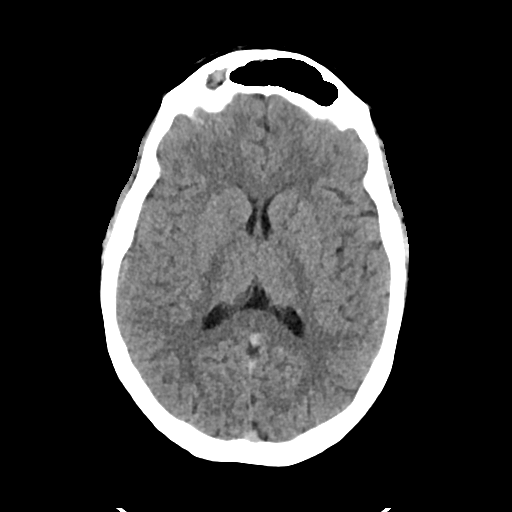
[im 22/36  brain]
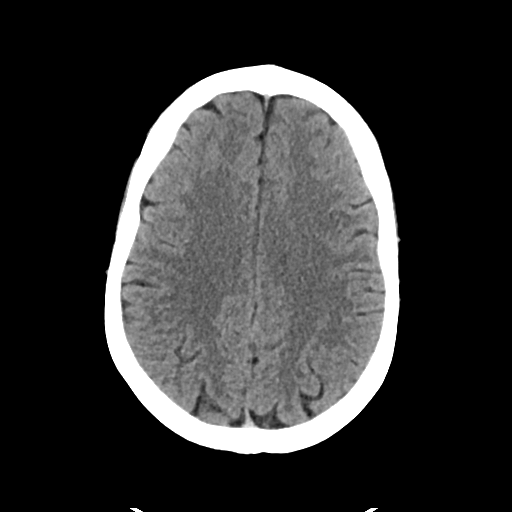
[im 22/36  bone]
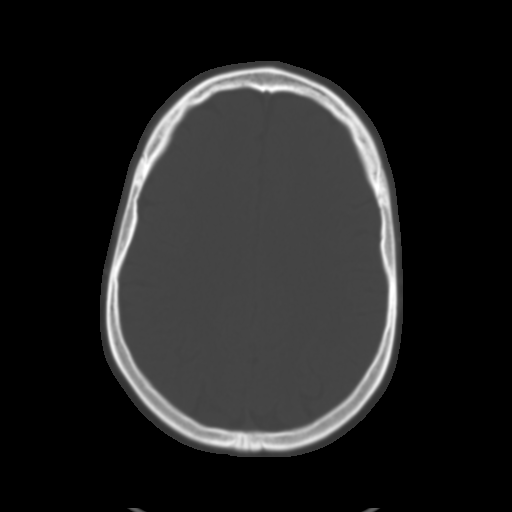
[im 27/36  brain]
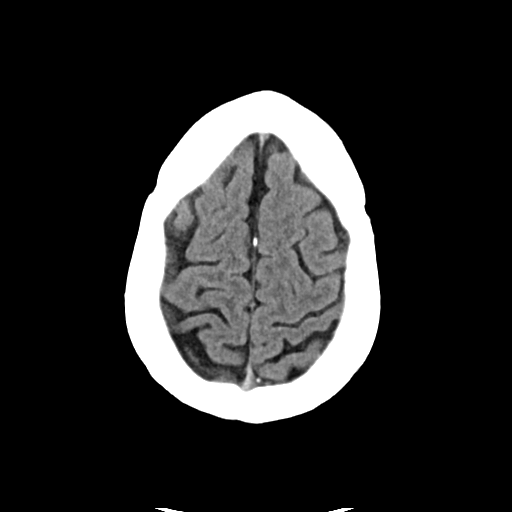
[im 31/36  brain]
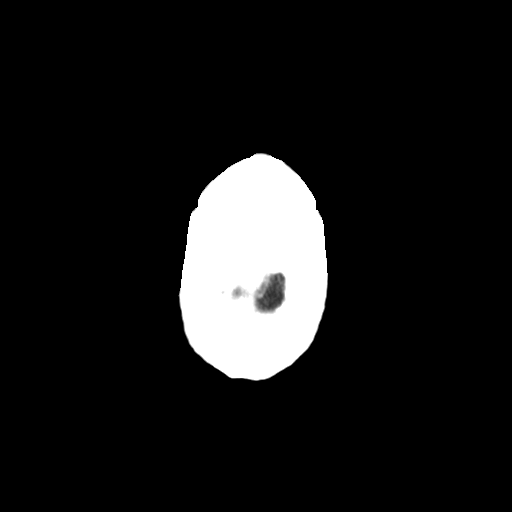

[Series 4: head bone · axial · 0.47mm/px · z∈[-228,-192]mm · 3 of 89 slices shown]
[im 9/89  bone]
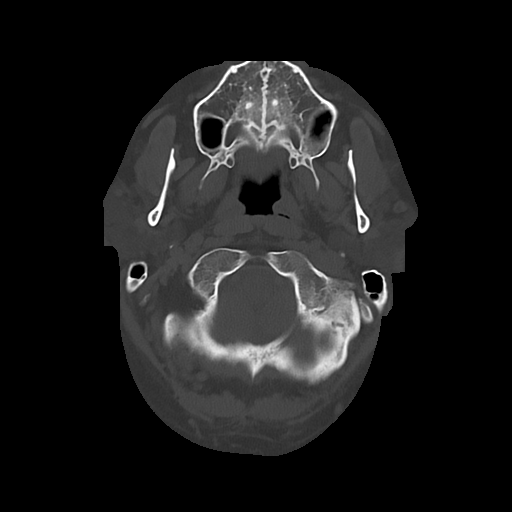
[im 18/89  bone]
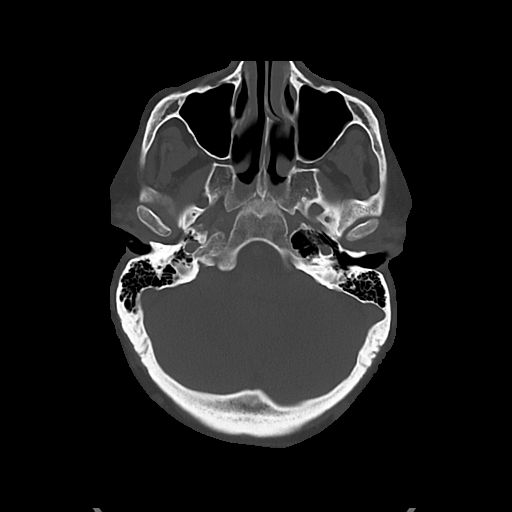
[im 27/89  bone]
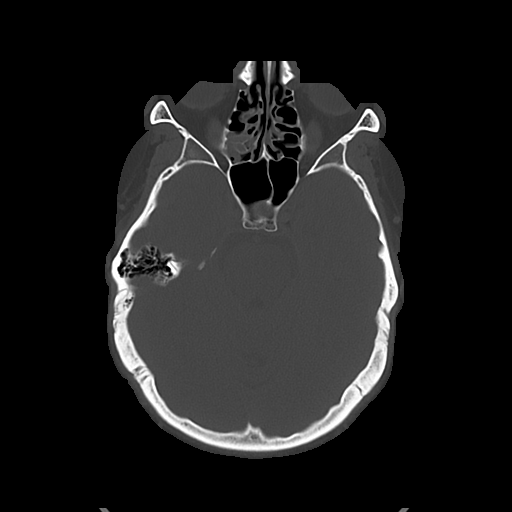

[Series 5: cor soft · coronal · 0.33mm/px · 3 of 79 slices shown]
[im 27/79  brain]
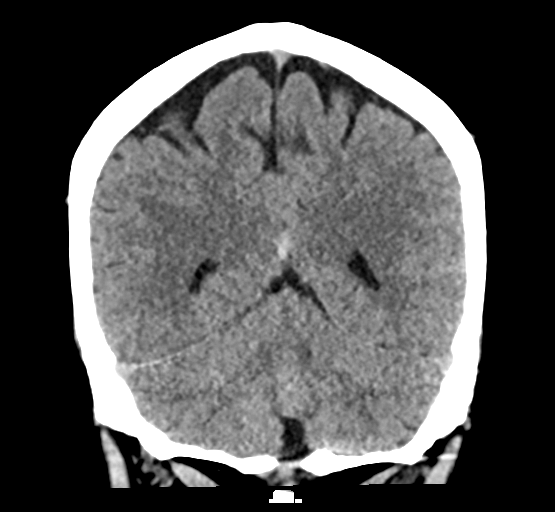
[im 35/79  brain]
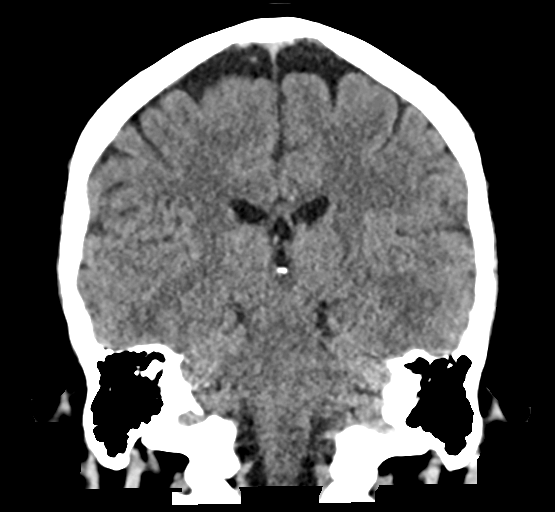
[im 44/79  brain]
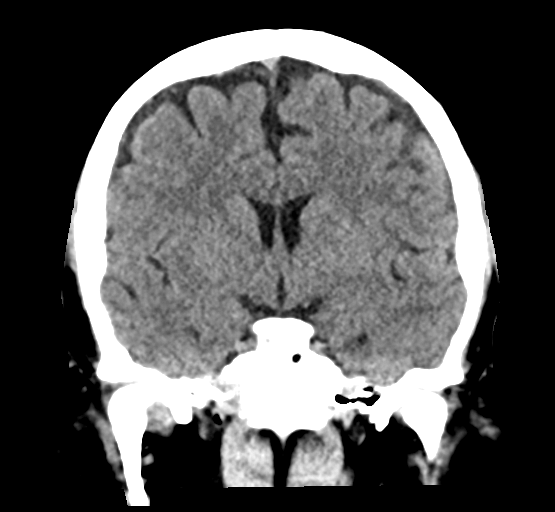

[Series 6: sag soft · sagittal · 0.33mm/px · 3 of 61 slices shown]
[im 21/61  brain]
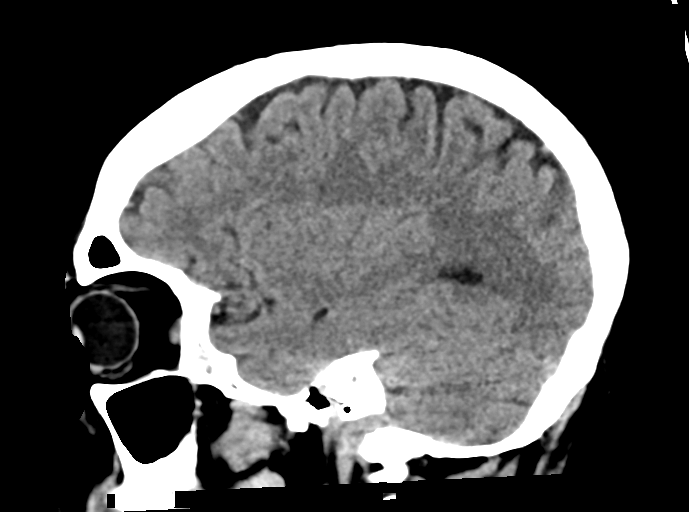
[im 31/61  brain]
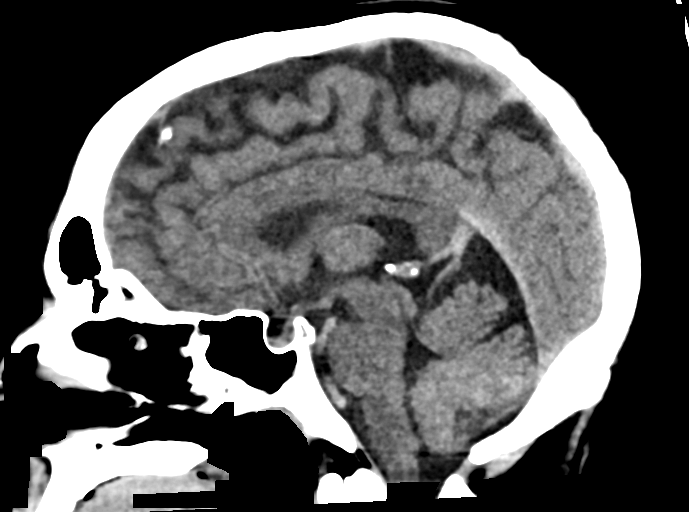
[im 41/61  brain]
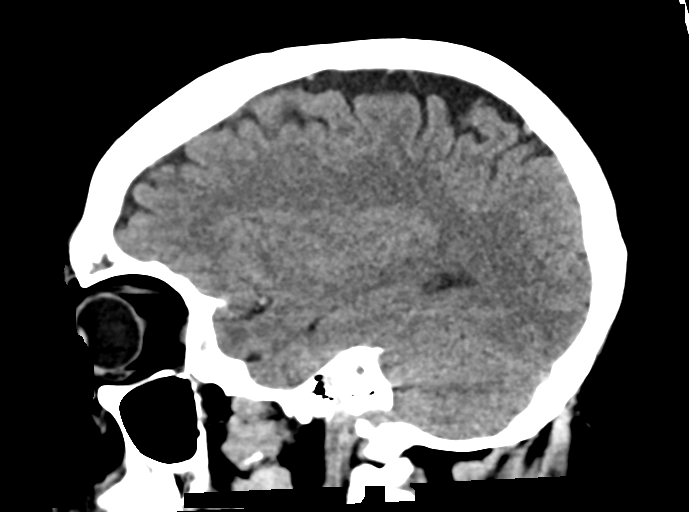

[16 of 47 positions shown; findings below may reference images not displayed]

FINDINGS: Brain: The known hypodensity within the left half of the medulla is
again visualized and stable in appearance. No findings to suggest
acute hemorrhage, acute infarction or space-occupying mass lesion
are noted.

Vascular: No hyperdense vessel or unexpected calcification.

Skull: Normal. Negative for fracture or focal lesion.

Sinuses/Orbits: Mild mucosal thickening is noted within the ethmoid
and frontal sinuses.

Other: None.
IMPRESSION: Stable hypodensity within the left medulla similar to that seen on
prior exam. No new acute abnormality is noted.

## 2020-09-19 NOTE — Progress Notes (Signed)
Physical Therapy Session Note  Patient Details  Name: Curtis [name] MRN: [id number] Date of Birth: May 14, 1985  Today's Date: 09/19/2020 PT Individual Time: 6950-7225 PT Individual Time Calculation (min): 30 min   Short Term Goals: Week 1:  PT Short Term Goal 1 (Week 1): STG = LTG due to ELOS  Skilled Therapeutic Interventions/Progress Updates:    Patient in supine and agreeable to PT.  Patient supine to sit with S and ambulated min to CGA to dayroom with occasional veering due to L LE coordination/weakness.  Patient standing to play Wii bowling, tennis and baseball with CGA to S cues for standing in stride with L foot in front the R.  Patient using R UE for game play with only minor difficulty with tennis backhand reaching across to L side.  Patient with seated rest break x 2 during approximate 20 minutes of gameplay.  Patient ambulated to room with CGA.  S to supine and left with hot pack to L upper trap, all needs in reach and family in the room.   Therapy Documentation Precautions:  Precautions Precautions: Fall Precaution Comments: Wallenburg Syndrome. Nausea/dizziness, diplopia, rotating eye patch Restrictions Weight Bearing Restrictions: No  Pain: Pain Assessment Pain Scale: 0-10 Pain Score: 4  Pain Type: Acute pain Pain Location: Eye Pain Orientation: Left Pain Descriptors / Indicators: Sharp Pain Frequency: Other (Comment) (new onset) Pain Onset: With Activity Pain Intervention(s): Repositioned;Heat applied    Therapy/Group: Individual Therapy  Elray Mcgregor [place], [place] 09/19/2020, 7:00 PM

## 2020-09-19 NOTE — Progress Notes (Signed)
Called and informed on call Truitt Leep, NP) that patient had returned to department and and results are in the computer per her request  2034 Notified by on-call, states it ok to inform patient mother of results and that Dr Flonnie Hailstone was also [facility] aware, continue to monitor and patient will be assessed in the morning by medical team   09/20/20@ 0600 Patient awaken alert oriented verbalized left neck discomfort tenderness to touch,muscle tightness. Medicated with prn, and monitored.states eye pain is resolved

## 2020-09-19 NOTE — Progress Notes (Signed)
Physical Therapy Session Note  Patient Details  Name: Curtis [name] MRN: [id number] Date of Birth: November 06, 1985  Today's Date: 09/19/2020 PT Individual Time: 0900-1000 PT Individual Time Calculation (min): 60 min   Short Term Goals: Week 1:  PT Short Term Goal 1 (Week 1): STG = LTG due to ELOS  Skilled Therapeutic Interventions/Progress Updates:    pt received in bed and agreeable to therapy. No complaint of pain on arrival. Pt reports some feelings of unsteadiness as he gets moving in the morning. Pt with eye patch on L eye throughout session.   Pt ambulated to/from therapy gym with RW and CGA. Pt with occ swaying/Lob but did not require assist to correct. Pt participated in gait training in the form of the agility ladder and obstacle course to challenge balance. Pt completed all activities without AD.  Agility ladder -forward, 1 foot to a square. Pt had difficulty initially, improved with repetition.  -stepping diagonally into/out of squares. Incr difficulty moving toward R side -laterally, stepping forward and back out of squares. Pt unable to step backward onto L foot without LOB at this time, able to maintain balance when stepping back onto R foot.   Obstacle course: -agility ladder, 1 foot to a square -5" curb step -stepping over hockey sticks, pt managed without slowing speed -walking up wedge onto 8" curb step Additionally trials with the following: -adjusted course to include direction changes -stepping onto bosu with L foot in // bars -walking over rocker board in // bars -holding 7lb weight in one hand, repeated in both hands  Pt reported only occ dizziness when turning to sit that resolved in sitting. Pt also reported throbbing headache that felt like a tension headache. Therapist performed very gentle STM to paraspinals, scalenes, and upper trap, being careful to avoid any cervical joint mobilization 2/2 MOI. Pt reported improvement in headache after manual therapy. Pt  reported being confused by the L sided weakness that did not align with a L sided stroke. Therapist performed first rib mobilization and STM at base of scalenes, pt reported increased sensation in L hand almost immediately. This therapist suspects TOS 2/2 tight cervical musculature and cervicogenic headache. Pt education on OP interventions such as STM and dry needling while performing manual therapy in response to pt inquiry.   Pt ambulated back to room with RW and opted to sit in recliner. Pt remained seated in recliner with all needs in reach and his father present.    Therapy Documentation Precautions:  Precautions Precautions: Fall Precaution Comments: Wallenburg Syndrome. Nausea/dizziness, diplopia, rotating eye patch Restrictions Weight Bearing Restrictions: No    Therapy/Group: Individual Therapy  Juluis Rainier 09/19/2020, 4:25 PM

## 2020-09-19 NOTE — Progress Notes (Addendum)
Patient transported via bed for Stat CT Scan accompanied by staff, mother remains at bedside

## 2020-09-19 NOTE — Progress Notes (Signed)
PROGRESS NOTE   Subjective/Complaints:  Pt reports neck is much better- not resolved, but lidoderm was the most helpful- robaxin also helped.  Also had dizziness with PT at 9am yesterday-  Meeting with Elijah Birk today at 2:30 to work on neck pain as well.     Review of systems   Pt denies SOB, abd pain, CP, N/V/C/D, and vision changes    Objective:   No results found. No results for input(s): WBC, HGB, HCT, PLT in the last 72 hours.  No results for input(s): NA, K, CL, CO2, GLUCOSE, BUN, CREATININE, CALCIUM in the last 72 hours.   Intake/Output Summary (Last 24 hours) at 09/19/2020 0925 Last data filed at 09/18/2020 2015 Gross per 24 hour  Intake 120 ml  Output --  Net 120 ml        Physical Exam: Vital Signs Blood pressure 123/79, pulse 80, temperature 97.8 F (36.6 C), temperature source Oral, resp. rate 16, height 6' (1.829 m), weight (!) 139.2 kg, SpO2 98 %.    General: awake, alert, appropriate, sitting up in bed; nurse in room; NAD HENT: L eye is patched CV: regular rate; no JVD Pulmonary: CTA B/L; no W/R/R- good air movement GI: soft, NT, ND, (+)BS; hypoactive Psychiatric: appropriate; brighter affect today Neurological: Ox3   Neurologic: Mild skew deviation, cranial nerve IV, motor strength is 5/5 in bilateral deltoid, bicep, tricep, grip, hip flexor, knee extensors, ankle dorsiflexor and plantar flexor Sensory exam normal sensation to light touch  in bilateral upper and lower extremities Cerebellar exam mild dysmetria left finger-nose-finger Musculoskeletal: less tight in upper traps on L and levators- scalenes about the same  Assessment/Plan: 1. Functional deficits which require 3+ hours per day of interdisciplinary therapy in a comprehensive inpatient rehab setting. Physiatrist is providing close team supervision and 24 hour management of active medical problems listed below. Physiatrist and rehab  team continue to assess barriers to discharge/monitor patient progress toward functional and medical goals  Care Tool:  Bathing    Body parts bathed by patient: Right arm, Left arm         Bathing assist Assist Level: Minimal Assistance - Patient > 75%     Upper Body Dressing/Undressing Upper body dressing   What is the patient wearing?: Pull over shirt    Upper body assist Assist Level: Set up assist    Lower Body Dressing/Undressing Lower body dressing      What is the patient wearing?: Pants, Underwear/pull up     Lower body assist Assist for lower body dressing: Minimal Assistance - Patient > 75%     Toileting Toileting Toileting Activity did not occur (Clothing management and hygiene only): N/A (no void or bm)  Toileting assist Assist for toileting: Minimal Assistance - Patient > 75%     Transfers Chair/bed transfer  Transfers assist     Chair/bed transfer assist level: Minimal Assistance - Patient > 75% Chair/bed transfer assistive device: Armrests   Locomotion Ambulation   Ambulation assist      Assist level: Minimal Assistance - Patient > 75% Assistive device: No Device Max distance: 174ft   Walk 10 feet activity   Assist  Assist level: Minimal Assistance - Patient > 75% Assistive device: No Device   Walk 50 feet activity   Assist    Assist level: Minimal Assistance - Patient > 75% Assistive device: No Device    Walk 150 feet activity   Assist    Assist level: Minimal Assistance - Patient > 75% Assistive device: No Device    Walk 10 feet on uneven surface  activity   Assist Walk 10 feet on uneven surfaces activity did not occur: Safety/medical concerns         Wheelchair     Assist Is the patient using a wheelchair?: No   Wheelchair activity did not occur: N/A         Wheelchair 50 feet with 2 turns activity    Assist    Wheelchair 50 feet with 2 turns activity did not occur: N/A        Wheelchair 150 feet activity     Assist  Wheelchair 150 feet activity did not occur: N/A       Blood pressure 123/79, pulse 80, temperature 97.8 F (36.6 C), temperature source Oral, resp. rate 16, height 6' (1.829 m), weight (!) 139.2 kg, SpO2 98 %.  Medical Problem List and Plan: 1.  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  Antithrombotics: -DVT/anticoagulation: SCDs               -antiplatelet therapy: Aspirin 81 mg daily and Plavix 75 mg daily x3 months then repeat vascular imaging to determine need to continue DAPT 3. Pain Management: Neurontin 300 mg 3 times daily, Tylenol 3 as needed             -tylenol has generally been effective for intermittent headaches  8/24- will try Robaxin 750 mg q8 hours prn- and lidoderm patch on L side of neck- 8pm to 8am- if not effective, will try Trigger point injections tomorrow  8/25- wil wait on trigger point injections, things are ~50% better- con't regimen 4. Mood: Provide emotional support             -antipsychotic agents: N/A  8/24- mood is stable, but would likely benefit from stroke support group after d/c.  5. Neuropsych: This patient is capable of making decisions on his own behalf. 6. Skin/Wound Care: Routine skin checks 7. Fluids/Electrolytes/Nutrition: Routine in and outs with follow-up chemistries on admit 8.  PFO noted on transthoracic echo, right to left shunt noted.  Unlikely cause of current stroke no further w/u per neuro, may f/u with cardiology as OP  9.  CN 4 palsy left , expect this to improve as it is outside of the main infarct location   8/25- wearing patch- alternating- con't regimen 10. Morbid  Obesity  8/25- BMI is 41.62- counseling and diet/exercise given  LOS: 6 days A FACE TO FACE EVALUATION WAS PERFORMED  Quetzalli Clos 09/19/2020, 9:25 AM

## 2020-09-19 NOTE — Progress Notes (Signed)
Occupational Therapy Session Note  Patient Details  Name: Curtis [name] MRN: [id number] Date of Birth: 07/13/1985  Today's Date: 09/19/2020 OT Individual Time: 4401-0272 OT Individual Time Calculation (min): 57 min    Short Term Goals: Week 1:  OT Short Term Goal 1 (Week 1): Pt will complete an ambulatory toilet transfer using RW with no more than Min A OT Short Term Goal 2 (Week 1): Pt will complete LB dressing at sit<stand level with no more than CGA OT Short Term Goal 3 (Week 1): Pt will complete 2 grooming tasks while standing at the sink while independently utilizing 2 compensatory strategies to manage symptoms of dizzyness/nausea  Skilled Therapeutic Interventions/Progress Updates:    Session 1: (5366-4403)  Pt completed functional mobility down to the ortho gym with min assist and no device and patch over the left eye with glasses on for acuity.  Increased lean to the left noted with occasional LOB, but he was mostly able to regain with just min guard assist.  Dizziness reported at 2/10 to start session with focus on functional mobility and balance with incorporation of head movements.  Had him stand statically and toss a kickball up to himself and catch it with supervision and with eyes unpatched.  No increase in dizziness noted.  Had him hold the ball and walk down to the hallway with min assist for balance and occasional stagger to the left.  Progressed to having him walk and toss the ball to himself and his balance improved with less sway to the left noted and consistent catching of the ball.  No increase in dizziness with this task.  Next, had him dribble the ball with his left hand while walking.  Min guard to min assist secondary to having to reach and bend to different heights to keep up with the ball, but still no significant LOB that he could not regain and no increase in dizziness.  Worked on standing with toe tapping of the LLE on 3" step as well as placing it on the step and  maintaining.  Increased difficulty noted initially with completion secondary to LOB to the left.  This improved with further practice.  Some increased dizziness stated during task reported at 5-6/10 with both eyes open and no patch in place.  This resolved back down to a 2/10 in less than 30 seconds of rest.  Continued working on functional mobility without an assistive device with quick start/stops, body turns, and head turns down the hallway.  Overall min guard for balance with no significant increase in dizziness.  Returned to the room at the end of the session with pt left sitting up in the bed with his father present.  Call button and phone in reach with safety belt in place.    Session 2:  [phone])  Pt was able to complete supine to sit with supervision and then donn his shoes.  He reported dizziness at 0/10 when asked.  He ambulated down to the dayroom at min to min guard assist with no device and without a patch in place.  Had him work on dynamic balance and functional mobility while engaged in golf putting task.  He was able to stand and put as well as ambulate to pick up the balls from the floor at min guard assist level.  Occasional increase in dizziness up to a 2/10 with reaching down to the floor but this was not always consistent.  He was able to continue activity up to 10  mins without need for a rest break.  While resting, therapist was unable to note any resting nystagmus in his eyes and he reported single vision at approximately 2' at midline and also slightly to the right of midline.  If target was moved further away, he reported diplopia in all fields.  Finished session with return to the room and pt left resting in the bed with the call button and phone in reach and safety alarm in place.      Therapy Documentation Precautions:  Precautions Precautions: Fall Precaution Comments: Wallenburg Syndrome. Nausea/dizziness, diplopia, rotating eye patch Restrictions Weight Bearing  Restrictions: No  Pain:  See Pain Flow Sheet for details  ADL: See Care Tool Section for some details of mobility and selfcare  Therapy/Group: Individual Therapy  Josemiguel Gries OTR/L 09/19/2020, 4:23 PM

## 2020-09-19 NOTE — Progress Notes (Signed)
Slept well throughout the night. No issues noted.

## 2020-09-19 NOTE — Progress Notes (Signed)
Patient reported new onset pain behind his left eye. Pain is sharp, 6/10. Pain alleviated to small degree by squeezing eye shut. Onset was mild pain increasing to level 6 over period of one hour when reported to nurse. Has not had this kind of pain prior. Tylenol PRN given and symptom reported to NP on call.

## 2020-09-19 NOTE — Progress Notes (Signed)
Occupational Therapy Session Note  Patient Details  Name: Curtis [name] MRN: [id number] Date of Birth: 03-05-1985  Today's Date: 09/19/2020 OT Individual Time: 1345-1415 OT Individual Time Calculation (min): 30 min    Short Term Goals: Week 1:  OT Short Term Goal 1 (Week 1): Pt will complete an ambulatory toilet transfer using RW with no more than Min A OT Short Term Goal 2 (Week 1): Pt will complete LB dressing at sit<stand level with no more than CGA OT Short Term Goal 3 (Week 1): Pt will complete 2 grooming tasks while standing at the sink while independently utilizing 2 compensatory strategies to manage symptoms of dizzyness/nausea  Skilled Therapeutic Interventions/Progress Updates:    OT intervention with focus on myofascial release for pt's Lt upper traps and scalenes. Pt with considerable trigger points relieved with trigger point therapy and myofascial release with pt in supported and unsupported sitting. Myofascial release also to Bil occipital muscle groups. Pt reported relief and feeling "looser." Pt returned to bed. Bed alarm activated and all needs within reach.   Therapy Documentation Precautions:  Precautions Precautions: Fall Precaution Comments: Wallenburg Syndrome. Nausea/dizziness, diplopia, rotating eye patch Restrictions Weight Bearing Restrictions: No Pain:  Pt c/o Lt upper traps and scalene pain; relieved with myofascial release   Therapy/Group: Individual Therapy  Rich Brave 09/19/2020, 2:50 PM

## 2020-09-19 NOTE — Progress Notes (Signed)
Received a call from [facility], at 18:09 reporting Mr. Albro stated he was having sharp pain behind his left eye. He denies any visual changes and denies history of migraines. Dr Berline Chough note was reviewed. VSS. Place a message to Dr Berline Chough awaiting a return call.  CT Head W/O contrast was ordered, awaiting results. Dr Berline Chough return the call, she is aware of the above. We will continue to monitor.

## 2020-09-19 NOTE — Progress Notes (Signed)
Patient ID: Curtis [name], male   DOB: 28-Aug-1985, 35 y.o.   MRN: [id number]  This SW covering for primary SW, Lavera Guise.  Outpatient PT/OT referral sent to  Outpatient ([phone]/[phone]).  Cecile Sheerer, MSW, LCSWA Office: [phone] Cell: [phone] Fax: [phone]

## 2020-09-20 LAB — GLUCOSE, CAPILLARY: Glucose-Capillary: 89 mg/dL (ref 70–99)

## 2020-09-20 NOTE — Progress Notes (Signed)
Physical Therapy Session Note  Patient Details  Name: Curtis [name] MRN: [id number] Date of Birth: [birth year]  Today's Date: 09/20/2020 PT Individual Time: 1430-1500 PT Individual Time Calculation (min): 30 min   Short Term Goals: Week 1:  PT Short Term Goal 1 (Week 1): STG = LTG due to ELOS   Skilled Therapeutic Interventions/Progress Updates:  Patient supine in bed on entrance to room. Patient alert and agreeable to PT session but relates that he has been suffering this day from increased dizziness in sitting as well as in standing. Patient denied pain during session.  Therapeutic Activity: Bed Mobility: Patient performed supine <> sit with Mod I for extra time. No cueing required for technique. Increased dizziness related in sitting that does not resolve in time. Guided in abdominal breathing with no resolution in s/s. Return to supine Transfers: Patient performed STS with CGA for safety with dizziness. No vc required for technique, but vc provided re: breath control and exhale on effort as pt noted to hold breath during bed mobility. Increased dizziness and pt request to sit.   Pt request to toilet, rise to sit with Mod I, STS with CGA and ambulatory transfer to toilet with CGA using RW. TC for upright posture and decreasing lean into RW.   Discussion with pt to rest for today as pt has completed a very full day of therapy already with no change in dizziness. Pt with expected visit with wife and newborn tomorrow and related to pt that it is most likely more important to attempt to rest and resolve s/s of dizziness today prior to visist as pt will want improvement in symptoms for optimal visit.   Patient seated on toilet at end of session on pt request. Pt's father present, and NT notified of pt's location and upcoming need for assistance back to supine in bed.      Therapy Documentation Precautions:  Precautions Precautions: Fall Precaution Comments: Wallenburg Syndrome.  Nausea/dizziness, diplopia, rotating eye patch Restrictions Weight Bearing Restrictions: No General: PT Amount of Missed Time (min): 60 Minutes PT Missed Treatment Reason: Other (Comment) (Increased dizziness in sitting then standing) Pain: Pain Assessment Pain Scale: Faces Pain Score: 0-No pain  Therapy/Group: Individual Therapy  Loel Dubonnet PT, DPT 09/20/2020, 4:34 PM

## 2020-09-20 NOTE — Progress Notes (Signed)
Recreational Therapy Assessment and Plan  Patient Details  Name: Curtis [name] MRN: [id number] Date of Birth: 03-19-1985 Today's Date: 09/20/2020  Rehab Potential:  Good ELOS:   d/c 9/1  Assessment  Hospital Problem: Principal Problem:   Lateral medullary syndrome     Past Medical History: History reviewed. No pertinent past medical history. Past Surgical History: History reviewed. No pertinent surgical history.   Assessment & Plan Clinical Impression: Patient is a 35 year old right-handed male with history of obesity with BMI 36.62.  Per chart review patient lives with spouse including 62-monthold daughter.  1 level home 3 steps to entry.  Independent prior to admission.  Patient works full-time from home.  [facility] left-sided numbness, headache dizziness as well as nausea x1 week.  Per report recently presented to chiropractor 2 weeks ago for regular back adjustments.  He denied having any symptoms at that time however he has had some persistent on and off neck pain x3 weeks.  MRI of the brain showed small acute stroke of the lateral left medulla.  CTA head and neck irregularity of the V2 segment of the left vertebral artery with sites of up to mild/moderate stenosis.  Irregularity of the V4 left vertebral artery with sites of up to moderate/severe stenosis.  MRI of the brain follow-up more conspicuous signal abnormality in the left medulla that could possibly be evolutionary change of previous stroke.  CT angiogram head and neck repeated 09/11/2020 showing persistent findings of left vertebral artery dissection extracranially and intracranially.  Slightly increased narrowing at the level of the C2 transverse foramen.  Neurology follow-up currently maintained on aspirin and Plavix for CVA prophylaxis x3 months.  Patient would require repeat vascular imaging in 3 months to determine need to continue DAPT.  Tolerating a regular consistency diet.  Therapy evaluations  completed due to patient decreased functional mobility was admitted for a comprehensive rehab program. Patient transferred to CIR on 09/13/2020.     Pt presents with decreased activity tolerance, decreased functional mobility, decreased balance, decreased coordination, decreased visual acuity, decreased vision Limiting pt's independence with leisure/community pursuits.  Plan Min 1 TR session >20 minutes during LOS   Recommendations for other services: None   Discharge Criteria: Patient will be discharged from TR if patient refuses treatment 3 consecutive times without medical reason.  If treatment goals not met, if there is a change in medical status, if patient makes no progress towards goals or if patient is discharged from hospital.  The above assessment, treatment plan, treatment alternatives and goals were discussed and mutually agreed upon: by patient  ST[facility]8/26/2022, 3:38 PM

## 2020-09-20 NOTE — Progress Notes (Signed)
Occupational Therapy Weekly Progress Note  Patient Details  Name: Curtis [name] MRN: [id number] Date of Birth: 08-12-1985  Beginning of progress report period: September 14, 2020 End of progress report period: September 20, 2020  Today's Date: 09/20/2020 OT Individual Time: 0973-5329 OT Individual Time Calculation (min): 62 min    Patient has met 3 of 1 short term goals.  Pt is making steady progress with OT and currently completes UB selfcare at supervision level with LB selfcare at min guard assist.  He is able to complete toilet transfers at min to min guard assist without an assistive device.  Functional mobility is at the same level as well.  He continues to exhibit resting vertical nystagmus bilaterally with dizziness rated from level 2-8/10.  He still reports diplopia as well in all quadrants with fusion only at 6" from his nose at midline.  He currently tolerates patching to help with functional tasks, as dizziness increases when both eyes are unpatched.  Head movements from sidelying to sit at times increase dizziness but this is not consistent.  LUE function is at a non-dominant level overall and he uses it functionally for all tasks without assist.  Slight ataxia is still noted with Nine Hole Peg Test with the LUE in 29 seconds with RUE being completed at level 21.  Recommend continued CIR level OT at this time with expected discharge on 9/1 with 24 hr supervision.    Patient continues to demonstrate the following deficits: muscle weakness, impaired timing and sequencing, unbalanced muscle activation, and decreased coordination, decreased visual motor skills, central origin, and decreased standing balance, decreased postural control, hemiplegia, and decreased balance strategies and therefore will continue to benefit from skilled OT intervention to enhance overall performance with BADL and Reduce care partner burden.  Patient progressing toward long term goals..  Continue plan of care.  OT  Short Term Goals Week 2:  OT Short Term Goal 1 (Week 2): Continue working on established LTGs set at modified independent to supervision.  Skilled Therapeutic Interventions/Progress Updates:    Pt with increased dizziness this am from previous session yesterday, reporting at 6/10 at rest.  Noted greater nystagmus in resting position compared to yesterday in the pm.  He reports some relief with patching as well as closing his eyes but this does not resolve it completely.  He attempted to ambulate to the therapy gym without assistive device, but after standing he felt to unstable and requested the RW.  Used the RW with min assist to walk around the bed and to the sink where he reported increased dizziness and need to sit.  Took him down to the gym the rest of the way in the wheelchair.  Had him attempt use of the [employer] for balance reactions.  He was able to complete one set of Limits of Stability Program holding onto the bars with supervision.  When attempting the second time, he attempted to try without use of the bars, but was unable to maintain standing secondary to increased dizziness.  When holding onto the bars his dizziness stayed at 4 or under.  When he let go, it increased up to an 8/10 and he had to rest. Transitioned to sitting task using LUE for FM coordination secondary to dizziness limiting standing.  He was able to complete Nine Hole Peg Test in 29 seconds with the left and 21 with the right.  Had him work on Product/process development scientist as well to pick up pegs one at a time until  3 were in hand and then manipulate and place them in the slots.  Therapist positioned peg board further away to help increase difficulty.  He exhibited occasional dropping of pegs when attempting to manipulate them.  Finished session with return to the room via wheelchair and transfer back to the bedside recliner.  Educated pt on scapular retraction exercises to be completed multiple times daily secondary to left rotator cuff  weakness.  Call button and phone in reach.   Therapy Documentation Precautions:  Precautions Precautions: Fall Precaution Comments: Wallenburg Syndrome. Nausea/dizziness, diplopia, rotating eye patch Restrictions Weight Bearing Restrictions: No  Pain: Pain Assessment Pain Scale: Faces Pain Score: 0-No pain ADL: See Care Tool Section for some details of mobility and selfcare   Therapy/Group: Individual Therapy  Garett Tetzloff OTR/L 09/20/2020, 12:41 PM

## 2020-09-20 NOTE — Progress Notes (Signed)
Physical Therapy Session Note  Patient Details  Name: ROMELLE REILEY MRN: [id number] Date of Birth: 1985/08/08  Today's Date: 09/20/2020 PT Individual Time: 0900-0938 PT Individual Time Calculation (min): 38 min   Short Term Goals: Week 1:  PT Short Term Goal 1 (Week 1): STG = LTG due to ELOS  Skilled Therapeutic Interventions/Progress Updates:  PT received sitting in bed with HOB elevated. Pt agreeable to therapy. Pt reports 6/10 neck pain mainly on left upper trap region and dizziness is "okay this morning."  HOB elevated to EOB supervision 2/2 severe dizziness history. Pt donned shoes EOB independently, no increase in dizziness  Gait training to main therapy gym ~125 ft up to heavy minA 2/2 left anterior heavy lean/LOB. Pt able to correct with stepping strategy. Noted decreased right weight shifting, decreased LLE foot clearance and step length.   //bars activity for improved weight shifting towards right up to minA for steadying. Wooden [employer] 3x30" initiated with BUE and able to have brief periods of no UE with intense left lateral lean. Pt requiring BUE to correct. Blue smaller wobble board initiated, dizziness returned instantly.  Red foam wedge under LLE with improved weight shift to right, however unable to sustain 2/2 dizziness. Instructed in seated rest break in chair with back and feet on floor for grounding in between exercises.    Pt transported back to room in wheelchair 2/2 unsteadiness on feet from increased dizzinessfor less stimulated space. Short distance ambulation in room to recliner minA. Pt instructed in gaze stabilization activity, slight decrease in dizziness. Able to maintain <30 seconds before needing to close eyes. Pt states dizziness "the worst it has been."  Session ended early 2/2 to increased sx not relieved by rest breaks and busy schedule. Pt education on importance of rest as part of recovery process.  In recliner with feet on floor and call bell  within reach. All needs met at this time.   Therapy Documentation Precautions:  Precautions Precautions: Fall Precaution Comments: Wallenburg Syndrome. Nausea/dizziness, diplopia, rotating eye patch Restrictions Weight Bearing Restrictions: No General: PT Amount of Missed Time (min): 22 Minutes PT Missed Treatment Reason: Other (Comment) (Pt with increased levels of dizziness)  Pain: Pain Assessment Pain Scale: 0-10 Pain Score: 0-No pain Faces Pain Scale: Hurts a little bit Pain Type: Acute pain Pain Location: Neck Pain Orientation: Left Pain Descriptors / Indicators: Aching;Discomfort;Pounding;Tender Pain Frequency: Intermittent Pain Onset: On-going    Therapy/Group: Individual Therapy  Ares Cardozo, SPT 09/20/2020, 9:42 AM

## 2020-09-21 NOTE — Progress Notes (Signed)
PROGRESS NOTE   Subjective/Complaints:   Tom helped L upper shoulder/trap/levators, but not really scalenes- still very painful- took pain meds last night- knocked him out- Also cannot read Left>right of midline- eyes "wont go that way"- explained that is true, but has to keep trying to push it.  Asking for grounds pass today.    Review of systems   Pt denies SOB, abd pain, CP, N/V/C/D, and vision changes    Objective:   CT HEAD WO CONTRAST ( )  Result Date: 09/19/2020 CLINICAL DATA:  Left eye pain and EXAM: CT HEAD WITHOUT CONTRAST TECHNIQUE: Contiguous axial images were obtained from the base of the skull through the vertex without intravenous contrast. COMPARISON:  09/13/2020 FINDINGS: Brain: The known hypodensity within the left half of the medulla is again visualized and stable in appearance. No findings to suggest acute hemorrhage, acute infarction or space-occupying mass lesion are noted. Vascular: No hyperdense vessel or unexpected calcification. Skull: Normal. Negative for fracture or focal lesion. Sinuses/Orbits: Mild mucosal thickening is noted within the ethmoid and frontal sinuses. Other: None. IMPRESSION: Stable hypodensity within the left medulla similar to that seen on prior exam. No new acute abnormality is noted. Electronically Signed   By: Alcide Clever M.D.   On: 09/19/2020 19:57   No results for input(s): WBC, HGB, HCT, PLT in the last 72 hours.  No results for input(s): NA, K, CL, CO2, GLUCOSE, BUN, CREATININE, CALCIUM in the last 72 hours.   Intake/Output Summary (Last 24 hours) at 09/21/2020 1437 Last data filed at 09/21/2020 1300 Gross per 24 hour  Intake 440 ml  Output 500 ml  Net -60 ml        Physical Exam: Vital Signs Blood pressure (!) 142/96, pulse 81, temperature 97.8 F (36.6 C), temperature source Oral, resp. rate 18, height 6' (1.829 m), weight (!) 139.2 kg, SpO2 100 %.     General:  awake, alert, appropriate, sitting up in bed; but more tired today; NAD HENT: lateral palsy- eye patch on L eye; wearing eyeglasses as well CV: regular rate; no JVD Pulmonary: CTA B/L; no W/R/R- good air movement GI: soft, NT, ND, (+)BS Psychiatric: appropriate Neurologic: Mild skew deviation, cranial nerve IV, motor strength is 5/5 in bilateral deltoid, bicep, tricep, grip, hip flexor, knee extensors, ankle dorsiflexor and plantar flexor Sensory exam normal sensation to light touch  in bilateral upper and lower extremities Cerebellar exam mild dysmetria left finger-nose-finger Musculoskeletal: scalenes still VERY tight- on L- but levators and upper traps are much looser  Assessment/Plan: 1. Functional deficits which require 3+ hours per day of interdisciplinary therapy in a comprehensive inpatient rehab setting. Physiatrist is providing close team supervision and 24 hour management of active medical problems listed below. Physiatrist and rehab team continue to assess barriers to discharge/monitor patient progress toward functional and medical goals  Care Tool:  Bathing    Body parts bathed by patient: Right arm, Left arm         Bathing assist Assist Level: Minimal Assistance - Patient > 75%     Upper Body Dressing/Undressing Upper body dressing   What is the patient wearing?: Pull over shirt    Upper  body assist Assist Level: Set up assist    Lower Body Dressing/Undressing Lower body dressing      What is the patient wearing?: Pants, Underwear/pull up     Lower body assist Assist for lower body dressing: Minimal Assistance - Patient > 75%     Toileting Toileting Toileting Activity did not occur (Clothing management and hygiene only): N/A (no void or bm)  Toileting assist Assist for toileting: Minimal Assistance - Patient > 75%     Transfers Chair/bed transfer  Transfers assist     Chair/bed transfer assist level: Minimal Assistance - Patient > 75% Chair/bed  transfer assistive device: Armrests   Locomotion Ambulation   Ambulation assist      Assist level: Contact Guard/Touching assist Assistive device: No Device Max distance: 200'   Walk 10 feet activity   Assist     Assist level: Minimal Assistance - Patient > 75% Assistive device: No Device   Walk 50 feet activity   Assist    Assist level: Minimal Assistance - Patient > 75% Assistive device: No Device    Walk 150 feet activity   Assist    Assist level: Minimal Assistance - Patient > 75% Assistive device: No Device    Walk 10 feet on uneven surface  activity   Assist Walk 10 feet on uneven surfaces activity did not occur: Safety/medical concerns         Wheelchair     Assist Is the patient using a wheelchair?: No   Wheelchair activity did not occur: N/A         Wheelchair 50 feet with 2 turns activity    Assist    Wheelchair 50 feet with 2 turns activity did not occur: N/A       Wheelchair 150 feet activity     Assist  Wheelchair 150 feet activity did not occur: N/A       Blood pressure (!) 142/96, pulse 81, temperature 97.8 F (36.6 C), temperature source Oral, resp. rate 18, height 6' (1.829 m), weight (!) 139.2 kg, SpO2 100 %.  Medical Problem List and Plan: 1.  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  Antithrombotics: -DVT/anticoagulation: SCDs               -antiplatelet therapy: Aspirin 81 mg daily and Plavix 75 mg daily x3 months then repeat vascular imaging to determine need to continue DAPT 3. Pain Management: Neurontin 300 mg 3 times daily, Tylenol 3 as needed             -tylenol has generally been effective for intermittent  headaches  8/24- will try Robaxin 750 mg q8 hours prn- and lidoderm patch on L side of neck- 8pm to 8am- if not effective, will try Trigger point injections tomorrow  8/25- wil wait on trigger point injections, things are ~50% better- con't regimen  8/27- will do trigger point injections tomorrow for L scalenes, tight musculature 4. Mood: Provide emotional support             -antipsychotic agents: N/A  8/24- mood is stable, but would likely benefit from  stroke support group after d/c.  5. Neuropsych: This patient is capable of making decisions on his own behalf. 6. Skin/Wound Care: Routine skin checks 7. Fluids/Electrolytes/Nutrition: Routine in and outs with follow-up chemistries on admit 8.  PFO noted on transthoracic echo, right to left shunt noted.  Unlikely cause of current stroke no further w/u per neuro, may f/u with cardiology as OP  9.  CN 4 palsy left , expect this to improve as it is outside of the main infarct location   8/25- wearing patch- alternating- con't regimen  8/27- explained to pt to continue to push looking L&R past midline- it can help overall.  10. Morbid Obesity  8/25- BMI is 41.62- counseling and diet/exercise given  LOS: 8 days A FACE TO FACE EVALUATION WAS PERFORMED  Glorianna Gott 09/21/2020, 2:37 PM

## 2020-09-21 NOTE — Progress Notes (Signed)
Physical Therapy Weekly Progress Note  Patient Details  Name: Curtis [name] MRN: [id number] Date of Birth: 15-Dec-1985  Beginning of progress report period: September 14, 2020 End of progress report period: September 21, 2020  Today's Date: 09/21/2020 PT Individual Time: 0907-1005 PT Individual Time Calculation (min): 58 min   Patient has met 0 of 1 short term goals due to no STGs being set initially based on ELOS. Pt is making steady progress with therapy and progressing independence with functional mobility. He continues to demonstrate impaired midline orientation with mild to moderate L lean during standing and ambulatory tasks in addition to central vestibular impairments with resting nystagmus and diplopia. He is performing supine<>sit with supervision, sit<>stands and stand pivot transfers with min assist, gait up to 243f with min assist, and navigating stairs using B HRs with min assist. He will benefit from continued CIR level therapies to further advance independence with functional mobility.   Patient continues to demonstrate the following deficits muscle weakness, decreased cardiorespiratoy endurance, unbalanced muscle activation, decreased visual motor skills, central origin, and decreased standing balance, decreased postural control, and decreased balance strategies and therefore will continue to benefit from skilled PT intervention to increase functional independence with mobility.  Patient progressing toward long term goals..  Continue plan of care.  PT Short Term Goals Week 1:  PT Short Term Goal 1 (Week 1): STG = LTG due to ELOS PT Short Term Goal 1 - Progress (Week 1): Progressing toward goal  Skilled Therapeutic Interventions/Progress Updates:  Ambulation/gait training;Balance/vestibular training;Cognitive remediation/compensation;Community reintegration;Discharge planning;Disease management/prevention;DME/adaptive equipment instruction;Functional electrical  stimulation;Functional mobility training;Neuromuscular re-education;Pain management;Patient/family education;Psychosocial support;Skin care/wound management;Splinting/orthotics;Stair training;Therapeutic Activities;Therapeutic Exercise;UE/LE Strength taining/ROM;Wheelchair propulsion/positioning;Visual/perceptual remediation/compensation;UE/LE Coordination activities   Pt received supine in bed wearing eye patch on L eye and agreeable to therapy session. Pt stating he is feeling much better today compared to yesterday. Reports he noticed that when reading he has a hard time visually scanning all the way to the right to see the final 2-3words on a line - encouraged to work on visually scanning R in room to locate and read words on a sign. Supine>sitting R EOB mod-I. Sitting EOB donned shoes set-up assist. Pt agreeable to not wearing eye patch during session - during agility ladder drills would have to shut 1 eye to get the rungs of the ladder in focus. Sit<>stands with CGA initially progressing to light min assist towards end of session with increased dizziness (with and without RW).   Gait training ~1528fto main therapy gym using RW with CGA for steadying - continues to demo mild L lean with pt able to recover balance using UE support on RW or utilize appropriate stepping strategy. Gait training ~15061fno AD, with pt continuing to demo mild L lean with ability to utilize stepping strategy with slightly abducted L LE step to recover balance - pt continues to state his L LE feels like a "peg leg." Dizziness rated as 2-3/10  Stair navigation ascending/descending 4 steps x4 without UE support on ascent but using light B UE support on B HRs during descent - reciprocal stepping pattern both directions requiring min assist for balance - demos mild L anterior lean on ascent.   Dynamic gait challenge using agility ladder with forward reciprocal stepping requiring min and intermittent mod assist initially due to  moderate L anterior lean - performed side stepping through agility ladder with CGA for steadying and then repeated forward stepping with pt demonstrating significant improvement in midline orientation with less L  lean. Dizziness rated as 4/10 with this activity.   Gait training ~115f to day room, no AD, with min assist for balance due to continued mild L lean with same gait mechanics as above for balance.  Dynamic standing balance on kinetron using B UE support as needed working on midline orientation (20cm/sec resistance) with pt initially coming to stand with ability to maintain midline with only light use of UE support and sustain it ~20 seconds but after a few repetitions pt started to have worsening L lean with sudden onset of headache pain stating "this is starting to kill my head because it is strenuous" and reporting L side of his head is "throbbing." Provided seated rest break in chair for back and arm support to provide increased supportive input. With prolonged seated rest break reports symptoms improvement though continues with L cervical muscle tension - pt hypothesizes he was tensing his neck while trying to keep his balance in standing on Kinetron. Vitals assessed: BP 130/97 (MAP 106), HR 97bpm   Gait ~1129fback to room using RW with CGA/intermittent light min assist for balance due to increased dizziness at this time compared to earlier in session. Sit>supine mod-I. Performed L upper trap and posterior cervical muscle soft tissue mobilization for 1035mtes with pt reporting improvement in muscle tension. Pt left supine in bed with needs in reach, his father present, and bed alarm on.   Therapy Documentation Precautions:  Precautions Precautions: Fall Precaution Comments: Wallenburg Syndrome. Nausea/dizziness, diplopia, rotating eye patch Restrictions Weight Bearing Restrictions: No   Pain: Details above - L cervical muscle pain/discomfort - provided soft tissue mobilization for  pain management - per pt, MD planning to perform trigger point injections tomorrow.  Therapy/Group: Individual Therapy  CarTawana ScalePT, DPT, NCS, CSRS 09/21/2020, 7:55 AM

## 2020-09-22 LAB — GLUCOSE, CAPILLARY: Glucose-Capillary: 155 mg/dL — ABNORMAL HIGH (ref 70–99)

## 2020-09-22 NOTE — Progress Notes (Signed)
PROGRESS NOTE   Subjective/Complaints:   Pt reports L neck is really bothering him today-  Hurting so much, needs to lay flat- Also had a quick flash of L eye pain last night- around 1-2am- gone immediately.  Has therapy at 11am today.    Review of systems   Pt denies SOB, abd pain, CP, N/V/C/D, and vision changes     Objective:   No results found. No results for input(s): WBC, HGB, HCT, PLT in the last 72 hours.  No results for input(s): NA, K, CL, CO2, GLUCOSE, BUN, CREATININE, CALCIUM in the last 72 hours.  No intake or output data in the 24 hours ending 09/22/20 1505       Physical Exam: Vital Signs Blood pressure (!) 141/94, pulse 86, temperature 98.2 F (36.8 C), temperature source Oral, resp. rate 18, height 6' (1.829 m), weight (!) 139.2 kg, SpO2 99 %.      General: awake, alert, appropriate, NAD HENT: Wearing patch on L eye- dysconjugate gaze oropharynx moist CV: regular rate; no JVD Pulmonary: CTA B/L; no W/R/R- good air movement GI: soft, NT, ND, (+)BS Psychiatric: appropriate Neurological: Ox3- laying flat due to pain  Neurologic: Mild skew deviation, cranial nerve IV, motor strength is 5/5 in bilateral deltoid, bicep, tricep, grip, hip flexor, knee extensors, ankle dorsiflexor and plantar flexor Sensory exam normal sensation to light touch  in bilateral upper and lower extremities Cerebellar exam mild dysmetria left finger-nose-finger Musculoskeletal: posterior/middle scalenes, upper traps and levators real tight- lesser so, splenius capitus and L pec- all on L.   Assessment/Plan: 1. Functional deficits which require 3+ hours per day of interdisciplinary therapy in a comprehensive inpatient rehab setting. Physiatrist is providing close team supervision and 24 hour management of active medical problems listed below. Physiatrist and rehab team continue to assess barriers to discharge/monitor  patient progress toward functional and medical goals  Care Tool:  Bathing    Body parts bathed by patient: Right arm, Left arm         Bathing assist Assist Level: Minimal Assistance - Patient > 75%     Upper Body Dressing/Undressing Upper body dressing   What is the patient wearing?: Pull over shirt    Upper body assist Assist Level: Set up assist    Lower Body Dressing/Undressing Lower body dressing      What is the patient wearing?: Pants, Underwear/pull up     Lower body assist Assist for lower body dressing: Minimal Assistance - Patient > 75%     Toileting Toileting Toileting Activity did not occur (Clothing management and hygiene only): N/A (no void or bm)  Toileting assist Assist for toileting: Minimal Assistance - Patient > 75%     Transfers Chair/bed transfer  Transfers assist     Chair/bed transfer assist level: Contact Guard/Touching assist Chair/bed transfer assistive device: Armrests   Locomotion Ambulation   Ambulation assist      Assist level: Contact Guard/Touching assist Assistive device: No Device Max distance: 500'   Walk 10 feet activity   Assist     Assist level: Contact Guard/Touching assist Assistive device: No Device   Walk 50 feet activity   Assist  Assist level: Contact Guard/Touching assist Assistive device: No Device    Walk 150 feet activity   Assist    Assist level: Contact Guard/Touching assist Assistive device: No Device    Walk 10 feet on uneven surface  activity   Assist Walk 10 feet on uneven surfaces activity did not occur: Safety/medical concerns         Wheelchair     Assist Is the patient using a wheelchair?: No   Wheelchair activity did not occur: N/A         Wheelchair 50 feet with 2 turns activity    Assist    Wheelchair 50 feet with 2 turns activity did not occur: N/A       Wheelchair 150 feet activity     Assist  Wheelchair 150 feet activity did not  occur: N/A       Blood pressure (!) 141/94, pulse 86, temperature 98.2 F (36.8 C), temperature source Oral, resp. rate 18, height 6' (1.829 m), weight (!) 139.2 kg, SpO2 99 %.  Medical Problem List and Plan: 1.  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  Antithrombotics: -DVT/anticoagulation: SCDs               -antiplatelet therapy: Aspirin 81 mg daily and Plavix 75 mg daily x3 months then repeat vascular imaging to determine need to continue DAPT 3. Pain Management: Neurontin 300 mg 3 times daily, Tylenol 3 as needed             -tylenol has generally been effective for intermittent headaches  8/24- will try Robaxin 750 mg q8 hours prn- and lidoderm patch on L side of neck- 8pm to 8am- if not effective, will try Trigger point injections tomorrow  8/25- wil wait on trigger point injections, things are ~50% better- con't regimen  8/27- will do trigger point injections tomorrow for L scalenes, tight musculature  8/28- will do trigger point injections today- pt voiced pain was 7/10 initially- improved to 4-5/10 within 2 minutes after injections.  4. Mood: Provide emotional support             -antipsychotic agents: N/A  8/24- mood is stable, but would likely benefit from stroke support group after d/c.  5. Neuropsych: This patient is capable of making decisions on his own behalf. 6. Skin/Wound Care: Routine skin checks 7. Fluids/Electrolytes/Nutrition: Routine in and outs with follow-up chemistries on admit 8.  PFO noted on transthoracic echo, right to left shunt noted.  Unlikely cause of current stroke no further w/u per neuro,  may f/u with cardiology as OP  9.  CN 4 palsy left , expect this to improve as it is outside of the main infarct location   8/25- wearing patch- alternating- con't regimen  8/27- explained to pt to continue to push looking L&R past midline- it can help overall.  10. Morbid Obesity  8/25- BMI is 41.62- counseling and diet/exercise given   Patient here for trigger point injections for  Consent done and on chart.  Cleaned areas  with alcohol and injected using a 27 gauge 1.5 inch needle  Injected 3cc Using 1% Lidocaine with no EPI  Upper traps L only Levators L only Posterior scalenes- L only x2 Middle scalenes- L only Splenius Capitus- L only Pectoralis Major- L only Rhomboids Infraspinatus Teres Major/minor Thoracic paraspinals Lumbar paraspinals Other injections-    Patient's level of pain prior was 7/10 Current level of pain after injections is 4-5/10 after 2 minutes  There was no bleeding or complications.  Pt advised to drink a lot of water today.    I spent a total of 41 minutes on total care today- >50% coordination of care/doing injections, consent, risks/benefits and follow up with afterwards.    LOS: 9 days A FACE TO FACE EVALUATION WAS PERFORMED  Curtis [name] 09/22/2020, 3:05 PM

## 2020-09-22 NOTE — Progress Notes (Signed)
Physical Therapy Session Note  Patient Details  Name: Curtis [name] MRN: [id number] Date of Birth: 1985-02-08  Today's Date: 09/22/2020 PT Individual Time: 1100-1200 PT Individual Time Calculation (min): 60 min   Short Term Goals: Week 2:  PT Short Term Goal 1 (Week 2): = to LTGs based on ELOS  Skilled Therapeutic Interventions/Progress Updates:    Pt received seated in bed, agreeable to PT session. Pt reports some soreness in L upper shoulder/trap area due to MD just doing lidocaine injections, reports decreased tightness in region as session progresses. Bed mobility independent. Sit to stand with close Supervision both with and without RW during session. Ambulation x 150 ft with RW and CGA for balance, decreased gait speed for increased safety with turns. Ambulation 2 x 500 ft with no AD and close Supervision to CGA, focus on decreased L lateral lean, decreased path deviation to the L, and improved L heel strike. Pt aware of gait deviations and able to correct with min cueing. Standing scanning task to the R matching cards with focus on scanning with eyes vs head turns. Pt reports when he is reading his eyes will "freeze up" and not fully scan to the R, if he blinks it "resets" and he is able to continue reading to the R. Ambulation through obstacle course weaving through cones progressing to side steps with cone taps; up/down 4" step, across uneven surface, and up/down wedge with CGA for balance with no AD. Standing with 1" step under LLE to encourage weight shift to the R while reaching to the R to place horseshoes on elevated basketball goal. Pt returned to bed at end of session, left seated in bed with needs in reach.  Therapy Documentation Precautions:  Precautions Precautions: Fall Precaution Comments: Wallenburg Syndrome. Nausea/dizziness, diplopia, rotating eye patch Restrictions Weight Bearing Restrictions: No    Therapy/Group: Individual Therapy   Peter Congo, PT, DPT,  CSRS  09/22/2020, 12:04 PM

## 2020-09-23 DIAGNOSIS — M7918 Myalgia, other site: Secondary | ICD-10-CM

## 2020-09-23 MED ORDER — SORBITOL 70 % SOLN
60.0000 mL | Status: AC
  Administered 2020-09-23: 60 mL via ORAL
  Filled 2020-09-23 (×2): qty 60

## 2020-09-23 NOTE — Progress Notes (Signed)
Physical Therapy Session Note  Patient Details  Name: Curtis [name] MRN: [id number] Date of Birth: [birth year]  Today's Date: 09/23/2020 PT Individual Time: 1030-1100 + 1430-1530 PT Individual Time Calculation (min): 30 min + 60 min  Short Term Goals: Week 2:  PT Short Term Goal 1 (Week 2): = to LTGs based on ELOS  Skilled Therapeutic Interventions/Progress Updates:     1st session: Pt supine in bed to start session - reports mild L neck pain but denies request for any pain interventions. Supine<>sit completed mod I level with use of bed features. Able to don shoes at EOB with setupA. Sit<>stand with CGA and no AD and ambulates to main rehab gym, ~17ft, with minA and no AD - gait deficits include mild L lean, mild ataxia with decreased R weight shift. In main rehab gym, worked on standing balance with feet together, semi-tandem, and full tandem stance with trials of eyes open vs eyes closed. Required minA overall for all stances except for full tandem which required modA for balance. During rest breaks, discussed DC planning, home safety, role of f/u therapies. Pt with questions regarding DME rec's and use of RW vs other AD such as rollator. Will f/u in later session with family. Pt ambulated back to his room in similar manner as above, remained supine in bed with all needs in reach for upcoming therapy session.   2nd session: Pt supine in bed to start. Pt doesn't report of pain during session. Focus of session to continue family education/training with family (wife and pt's father). Discussed pt's current mobility status, PT goals, role of f/u therapies, and use of AD's to improve indep. Discussed home safety strategies with home management and with his 30month old baby. Retrieved rollator from DME closet as pt was questioning it from previous session. Explained safety features of brakes and pros/cons compared to traditional RW. Pt completes sit<>stand to rollator with supervision. Ambulates  hallway distances with CGA (progressing to close supervision) with rollator - pt does a great job of slowing down speed for turns and general safety awareness with rollator. He completed car transfer at [facility] level with rollator with car height simulating their mid-size SUV. He was also able to navigate up/down 4 + 4 steps with CGA and 1 hand rail and then wife also had hands-on training with stairs with PT providing close supervision. He was able to copmlete furniture transfers from low sitting sofa couch and recliner with supervision and rollator. He ambulated variable distances during session, ranging from 237ft to 70ft, but ambulated a total of >587ft throughout. He ended session supine in bed with family remaining at bedside, all needs in reach.   Therapy Documentation Precautions:  Precautions Precautions: Fall Precaution Comments: Wallenburg Syndrome. Nausea/dizziness, diplopia, rotating eye patch Restrictions Weight Bearing Restrictions: No General:    Therapy/Group: Individual Therapy  Dwaine Pringle P Trinika Cortese PT 09/23/2020, 7:42 AM

## 2020-09-23 NOTE — Progress Notes (Addendum)
PROGRESS NOTE   Subjective/Complaints:   Pt still having neck discomfort/tightness but left trap better after TPI. Otherwise doing pretty well  ROS: Patient denies fever, rash, sore throat, blurred vision, nausea, vomiting, diarrhea, cough, shortness of breath or chest pain,   or mood change.   Objective:   No results found. No results for input(s): WBC, HGB, HCT, PLT in the last 72 hours.  No results for input(s): NA, K, CL, CO2, GLUCOSE, BUN, CREATININE, CALCIUM in the last 72 hours.   Intake/Output Summary (Last 24 hours) at 09/23/2020 1047 Last data filed at 09/22/2020 1901 Gross per 24 hour  Intake 236 ml  Output --  Net 236 ml         Physical Exam: Vital Signs Blood pressure 130/87, pulse 76, temperature 98.3 F (36.8 C), temperature source Oral, resp. rate 16, height 6' (1.829 m), weight (!) 139.2 kg, SpO2 98 %.      Constitutional: No distress . Vital signs reviewed. HEENT: NCAT, EOMI, oral membranes moist Neck: supple Cardiovascular: RRR without murmur. No JVD    Respiratory/Chest: CTA Bilaterally without wheezes or rales. Normal effort    GI/Abdomen: BS +, non-tender, non-distended Ext: no clubbing, cyanosis, or edema Psych: pleasant and cooperative  Neurologic: Mild skew deviation, cranial nerve IV, motor strength is 5/5 in bilateral deltoid, bicep, tricep, grip, hip flexor, knee extensors, ankle dorsiflexor and plantar flexor Sensory exam normal sensation to light touch  in bilateral upper and lower extremities Cerebellar exam mild dysmetria left finger-nose-finger Musculoskeletal: left trap more relaxed. Levator scap,scalenes still tight  Assessment/Plan: 1. Functional deficits which require 3+ hours per day of interdisciplinary therapy in a comprehensive inpatient rehab setting. Physiatrist is providing close team supervision and 24 hour management of active medical problems listed  below. Physiatrist and rehab team continue to assess barriers to discharge/monitor patient progress toward functional and medical goals  Care Tool:  Bathing    Body parts bathed by patient: Right arm, Left arm         Bathing assist Assist Level: Minimal Assistance - Patient > 75%     Upper Body Dressing/Undressing Upper body dressing   What is the patient wearing?: Pull over shirt    Upper body assist Assist Level: Set up assist    Lower Body Dressing/Undressing Lower body dressing      What is the patient wearing?: Pants, Underwear/pull up     Lower body assist Assist for lower body dressing: Minimal Assistance - Patient > 75%     Toileting Toileting Toileting Activity did not occur (Clothing management and hygiene only): N/A (no void or bm)  Toileting assist Assist for toileting: Minimal Assistance - Patient > 75%     Transfers Chair/bed transfer  Transfers assist     Chair/bed transfer assist level: Contact Guard/Touching assist Chair/bed transfer assistive device: Armrests   Locomotion Ambulation   Ambulation assist      Assist level: Contact Guard/Touching assist Assistive device: No Device Max distance: 500'   Walk 10 feet activity   Assist     Assist level: Contact Guard/Touching assist Assistive device: No Device   Walk 50 feet activity   Assist  Assist level: Contact Guard/Touching assist Assistive device: No Device    Walk 150 feet activity   Assist    Assist level: Contact Guard/Touching assist Assistive device: No Device    Walk 10 feet on uneven surface  activity   Assist Walk 10 feet on uneven surfaces activity did not occur: Safety/medical concerns         Wheelchair     Assist Is the patient using a wheelchair?: No   Wheelchair activity did not occur: N/A         Wheelchair 50 feet with 2 turns activity    Assist    Wheelchair 50 feet with 2 turns activity did not occur: N/A        Wheelchair 150 feet activity     Assist  Wheelchair 150 feet activity did not occur: N/A       Blood pressure 130/87, pulse 76, temperature 98.3 F (36.8 C), temperature source Oral, resp. rate 16, height 6' (1.829 m), weight (!) 139.2 kg, SpO2 98 %.  Medical Problem List and Plan: 1.  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  Antithrombotics: -DVT/anticoagulation: SCDs               -antiplatelet therapy: Aspirin 81 mg daily and Plavix 75 mg daily x3 months then repeat vascular imaging to determine need to continue DAPT 3. Pain Management: Neurontin 300 mg 3 times daily, Tylenol 3 as needed             -tylenol has generally been effective for intermittent headaches  8/24- will try Robaxin 750 mg q8 hours prn- and lidoderm patch on L side of neck- 8pm to 8am- if not effective, will try Trigger point injections tomorrow  8/25- wil wait on trigger point injections, things are ~50% better- con't regimen  8/27- will do trigger point injections tomorrow for L scalenes, tight musculature  8/29 left shoulder/neck partially improved after TPI's. Consider repeat injections tomorrow per Dr. Berline Chough   -discussed some stretches today 4. Mood: Provide emotional support             -antipsychotic agents: N/A   - mood is stable, but would likely benefit from stroke support group after d/c.  5. Neuropsych: This patient is capable of making decisions on his own behalf. 6. Skin/Wound Care: Routine skin checks 7. Fluids/Electrolytes/Nutrition: Routine in and outs with follow-up chemistries on admit 8.  PFO noted on transthoracic echo, right to left shunt noted.  Unlikely cause of current stroke no further w/u per neuro, may f/u with  cardiology as OP  9.  CN 4 palsy left , expect this to improve as it is outside of the main infarct location   8/29- wearing patch- alternating- con't regimen    10. Morbid Obesity  8/25- BMI is 41.62- counseling and diet/exercise given 11. Slow transit constipation  -sorbitol 60cc today, SSE if needed      LOS: 10 days A FACE TO FACE EVALUATION WAS PERFORMED  [name] 09/23/2020, 10:47 AM

## 2020-09-23 NOTE — Progress Notes (Signed)
Patient ID: Curtis [name], male   DOB: Feb 16, 1985, 35 y.o.   MRN: [id number] Follow up with the patient regarding pending discharge. Discussed medications and care for discharge. Patient feels he is ready to go. Reviewed process for discharge with written instruction packet to be sent home with the patient as a reference. Continue to follow along to discharge to address nursing questions. Pamelia Hoit

## 2020-09-23 NOTE — Progress Notes (Signed)
Occupational Therapy Note  Patient Details  Name: Curtis [name] MRN: [id number] Date of Birth: [birth year]  Today's Date: 09/23/2020 OT Missed Time: 60 Minutes Missed Time Reason: Unavailable (comment);Other (comment) (pt not in room at time of group transport)  Will check back as time allows to make up for missed minutes.    Pollyann Glen [facility] 09/23/2020, 3:50 PM

## 2020-09-23 NOTE — Progress Notes (Signed)
Patient ID: Curtis [name], male   DOB: 1985/06/15, 35 y.o.   MRN: [id number]  Rolling Walker ordered through [employer].   [place], [place] [phone]

## 2020-09-23 NOTE — Progress Notes (Signed)
Occupational Therapy Session Note  Patient Details  Name: Curtis [name] MRN: [id number] Date of Birth: 1985/06/07  Today's Date: 09/23/2020 OT Individual Time: 3149-7026 OT Individual Time Calculation (min): 73 min    Short Term Goals: Week 2:  OT Short Term Goal 1 (Week 2): Continue working on established LTGs set at modified independent to supervision.  Skilled Therapeutic Interventions/Progress Updates:    Pt in bed to start with family present for education.  Discussed current progress and need for supervision if up on his feet and completing transfers or functional mobility secondary to LOB to the left.  Educated on FM coordination tasks to be completed with the left hand as well as providing letter cancellation activity sheet to work on secondary to visual scanning difficulties to the right.  Recommended word search puzzles as well to help with visual scanning.  He continues to report diplopia and tolerate patching, however this session he did report some single vision at midline as well as right and left of midline, but only at eye level.  If target was moved superiorly or inferiorly right of midline he would report diplopia.  On the left he could still see single vision in some of the superior field close to midline but not in the inferior field.  Had him ambulate down to the ortho gym with use of the RW and min guard assist.  He then completed simulated walk-in shower transfer with use of the RW and shower seat.  Both he and family report having a built in bench for use.  He was able to complete transfer at min guard assist.  Next, progressed to use of the BITS for visual scanning and tracking activities.  He was able to complete Visual Scanning using the LUE with 87% accuracy for 2 min interval with an average of 1.18 seconds.  He then complete Psychologist, counselling Program with 96% accuracy and numbers 1-30 in 1 minute.  Progressed to Charts program with completion of 5 trials and errors  on 4/5 charts secondary to diplopia.  Average reaction time 5.6 seconds to locate the letter needed based on the coordinates given.  Finished with work on [employer] task where he was able to recall 10 word sequence without error.  Ambulated back to the room at min guard assist without use of an assistive device.  He was left sitting on the EOB with family present in preparation for PT session coming next.    Therapy Documentation Precautions:  Precautions Precautions: Fall Precaution Comments: Wallenburg Syndrome. Nausea/dizziness, diplopia, rotating eye patch Restrictions Weight Bearing Restrictions: No  Pain: Pain Assessment Pain Scale: Faces Pain Score: 0-No pain ADL: See Care Tool Section for some details of mobility and selfcare   Therapy/Group: Individual Therapy  Ednah Hammock OTR/L 09/23/2020, 3:47 PM

## 2020-09-24 MED ORDER — LIDOCAINE HCL 1 % IJ SOLN
10.0000 mL | Freq: Once | INTRAMUSCULAR | Status: AC
  Administered 2020-09-24: 10 mL
  Filled 2020-09-24: qty 10

## 2020-09-24 NOTE — Progress Notes (Signed)
Patient ID: Curtis [name], male   DOB: December 18, 1985, 35 y.o.   MRN: [id number]  New Patient Visit with Marjie Skiff, NP on Wednesday October 30, 2020 Arrive by 9:05 AM Starts at 9:20 AM (20 minutes)  [facility] [address] [place] [address] [phone]  Lavera Guise, [place] [phone]

## 2020-09-24 NOTE — Progress Notes (Signed)
Occupational Therapy Session Note  Patient Details  Name: Curtis [name] MRN: [id number] Date of Birth: 1985/12/03  Today's Date: 09/24/2020 OT Individual Time: 0906-1003 OT Individual Time Calculation (min): 57 min    Short Term Goals: Week 2:  OT Short Term Goal 1 (Week 2): Continue working on established LTGs set at modified independent to supervision.  Skilled Therapeutic Interventions/Progress Updates:    Session 1: [phone])  Pt completed functional mobility down to the dayroom with use of the RW and min guard assist.  He reported dizziness at 2/10 with mobility and this remained at the same level for many activities during session.  Eye patch was removed for all tasks with report of diplopia still present, but some areas of distant vision with objects presenting more fuzzy than double.  He was able to pick up and toss bean bags to the [employer] with the LUE, reaching to various heights as well as to the floor.  Had him stand on foam surface, stepping on and off at different directions.  Occasional LOB noted but he was able to correct with min assist to min guard.  He also progressed to stepping over a 12" barrier to toss them as well with min assist.  He was able to complete functional mobility without assistive device around the nurses station and back to the dayroom with min guard and no device as well.  He did report slight dizziness with bending forward down to the floor but this did not increase above a 4, and resolved quickly once standing up.  When attempting to stand tandem, he reported a significant increase in his dizziness requiring the need to sit, with min assist needed to maintain his balance secondary to left LOB.  He reported dizziness at 6/10.  With ambulation back to the room using the rollator, he became more dizzy, requiring the need to wear his patch.  Left pt sitting in the recliner to rest with dizziness at 4-5/10 and noted nystagmus at resting position.  Call  button and phone in reach with safety alarm in place.  Also provided handout on Neurodevelopmental Optometrist in the area as well to continuing working on vision after discharge.    Session 2: (1415-1450)  Pt completed functional mobility down to the ortho gym with supervision to begin session.  He reported dizziness at 2/10, which was less than earlier at end of last session.  He transitioned to the mat for work in quadriped to start.  Had him complete 2 sets of 7 reps modified pushup.  Then had him complete reaching forward with the RUE while stabilizing himself with the LUE and holding for 5 second intervals for 5 reps.  Transferred to sitting with supervision for work on left shoulder flexion.  Therapist applied 1.5 lb wrist weight to the left wrist while working on shoulder flexion.  Worked on completion of full shoulder flexion and then down to 100 degrees and holding and then down to 80 degrees and holding.  Noted increased ataxia with holding secondary to fatigue.  Finished session with return to the room via rollator with supervision.  No significant increase in dizziness, however increased pain behind the left eye noted in quadriped.    Therapy Documentation Precautions:  Precautions Precautions: Fall Precaution Comments: Wallenburg Syndrome. Nausea/dizziness, diplopia, rotating eye patch Restrictions Weight Bearing Restrictions: No   Pain: Pain Assessment Pain Scale: Faces Pain Score: 0-No pain ADL: See Care Tool Section for some details of mobility and selfcare  Therapy/Group: Individual Therapy  Goran Olden OTR/L 09/24/2020, 12:29 PM

## 2020-09-24 NOTE — Progress Notes (Signed)
Patient ID: Curtis [name], male   DOB: [birth year], 35 y.o.   MRN: [id number]  RW swapped for Rollator with [employer].  [place], [place] [phone]

## 2020-09-24 NOTE — Progress Notes (Signed)
Patient ID: Curtis [name], male   DOB: Dec 28, 1985, 35 y.o.   MRN: [id number]  Due to scheduling:   Pt updated PCP appointment  New Patient Visit with Larae Grooms Thursday November 14, 2020 Arrive by 8:05 AM EDT Starts at 8:20 AM EDT (40 minutes)  [facility] [address] [place] [address] [phone]

## 2020-09-24 NOTE — Discharge Summary (Signed)
Physician Discharge Summary  Patient ID: Curtis [name] MRN: [id number] DOB/AGE: 04-08-1985 35 y.o.  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 Multiplanar CT image reconstructions and MIPs were obtained to evaluate the vascular anatomy. Carotid stenosis measurements (when applicable) are obtained utilizing NASCET criteria, using the distal internal carotid diameter as the denominator. CONTRAST:  75mL OMNIPAQUE IOHEXOL 350 MG/ML SOLN COMPARISON:  CTA and MRI 09/09/2020 FINDINGS: CT HEAD Brain: There is no acute intracranial hemorrhage, mass effect, or edema. Gray-white differentiation is preserved. Small infarct of the lateral left medulla is better seen on the MRI. There is no extra-axial fluid collection. Ventricles and sulci are within normal limits in size and configuration. Vascular: No new findings. Skull: Calvarium is unremarkable. Sinuses/Orbits: No acute finding. Other: None. Review of the MIP images confirms the above findings CTA NECK Aortic arch: Patent great vessel origins. Aberrant origin of the right subclavian artery. Right carotid system: Patent. No stenosis or evidence of dissection. Left carotid system: Patent.  No stenosis or evidence of dissection. Vertebral arteries: Patent and  codominant. As before, there is irregularity and narrowing of the left V2 vertebral artery, for example from C3 to C5. There is an additional area narrowing within the left C2 transverse foramen (series 11, image 197) and this appears slightly increased. Stenosis is moderate. Skeleton: No new finding. Other neck: No new finding. Upper chest: No new finding. Review of the MIP images confirms the above findings CTA HEAD Anterior circulation: Intracranial internal carotid arteries are patent. Anterior and middle cerebral arteries are patent. Posterior circulation: Intracranial right vertebral artery is patent. Intracranial left vertebral artery is patent with persistent multifocal narrowing, including focal severe stenosis. Appearance is similar to the prior study. Basilar artery is patent. Bilateral AICA and SCA origins are patent. A PICA is not identified on either side. Posterior cerebral arteries are patent. Venous sinuses: Patent as allowed by contrast bolus timing. Review of the MIP images confirms the above findings IMPRESSION: No acute intracranial abnormality. Small infarct of medulla is better seen on prior MRI. Persistent findings of left vertebral artery dissection extracranially and intracranially. Slightly increased narrowing at the level of the C2 transverse foramen. Remains up to severe stenosis intracranially. Electronically Signed   By: Guadlupe Spanish M.D.   On: 09/11/2020 15:03   CT ANGIO HEAD NECK W WO CM  Result Date: 09/09/2020 CLINICAL DATA:  Provided history: Dizziness, persistent/recurrent, cardiac or vascular cause suspected; left sided neck pain s/p chiropractic adjustment with HA. EXAM: CT ANGIOGRAPHY HEAD AND NECK TECHNIQUE: Multidetector CT imaging of the head and neck was performed using the standard protocol during bolus administration of intravenous contrast. Multiplanar CT image reconstructions and MIPs were obtained to evaluate the vascular anatomy. Carotid stenosis measurements  (when applicable) are obtained utilizing NASCET criteria, using the distal internal carotid diameter as the denominator. CONTRAST:  75mL OMNIPAQUE  IOHEXOL 350 MG/ML SOLN COMPARISON:  None FINDINGS: CT HEAD FINDINGS Brain: Cerebral volume is normal. There is no acute intracranial hemorrhage. No demarcated cortical infarct. No extra-axial fluid collection. No evidence of an intracranial mass. No midline shift. Vascular: No hyperdense vessel. Skull: Normal. Negative for fracture or focal lesion. Sinuses: Moderate partial opacification of the right frontal sinus. Mild-to-moderate mucosal thickening within the left frontal sinus. Moderate partial opacification of the bilateral ethmoid air cells, likely due to the presence of mucosal thickening and fluid. Mild left maxillary sinus mucosal thickening. Orbits: No mass or acute finding. Review of the MIP images confirms the above findings CTA NECK FINDINGS Aortic arch: Aberrant right subclavian artery. The origin of the right subclavian artery is incompletely included in the field of view. The visualized aortic arch is normal in caliber. No hemodynamically significant stenosis within the proximal subclavian arteries. Right carotid system: CCA and ICA patent within the neck without stenosis or significant atherosclerotic disease. No evidence of dissection. Left carotid system: CCA and ICA patent within the neck without stenosis or significant atherosclerotic disease. No evidence of dissection Vertebral arteries: Venous contrast reflux limits evaluation of the V1 and proximal V2 segments of the right vertebral artery. Within this limitation, the right vertebral artery is patent within the neck without stenosis or evidence of dissection. The left vertebral artery is patent within the neck. However, there are sites of irregularity and mild to moderate narrowing within the V2 left vertebral artery highly suspicious for sequela of dissection given the provided history (for  instance as seen on series 7, image 137). Skeleton: No acute bony abnormality or aggressive osseous lesion. Other neck: No neck mass or cervical lymphadenopathy. Upper chest: No consolidation within the imaged lung apices. Review of the MIP images confirms the above findings CTA HEAD FINDINGS Anterior circulation: The intracranial internal carotid arteries are patent. The M1 middle cerebral arteries are patent. No M2 proximal branch occlusion or high-grade proximal stenosis is identified. The anterior cerebral arteries are patent. No intracranial aneurysm is identified. Posterior circulation: The intracranial right vertebral artery is patent without stenosis. There is irregularity and sites of up to moderate/severe narrowing within the V4 left vertebral artery, highly suspicious for dissection given the provided history (for instance as seen on series 7, images 211 and 212) (series 8, images 131 and 132). The basilar artery is patent. The posterior cerebral arteries are patent. The posterior communicating arteries are hypoplastic or absent bilaterally. Venous sinuses: Within the limitations of contrast timing, no convincing thrombus. Anatomic variants: As described. Review of the MIP images confirms the above findings These results were called by telephone at the time of interpretation on 09/09/2020 at 10:35 am to provider Jene Every , who verbally acknowledged these results. IMPRESSION: CT head: 1. No evidence of acute intracranial abnormality. 2. Paranasal sinus disease, as described. Correlate for acute sinusitis. CTA head/neck: 1. Irregularity of the V2 segment of the left vertebral artery with sites of up to mild/moderate stenosis. Irregularity of the V4 left vertebral artery with sites of up to moderate/severe stenosis. These findings are highly suspicious for left vertebral artery dissection given the provided history. A brain MRI is recommended to assess for acute intracranial ischemia. 2. Elsewhere within  the head and neck, there is no large vessel occlusion or proximal high-grade arterial stenosis. 3. Aberrant right subclavian artery. The right subclavian artery origin is incompletely included in the field of view. Electronically Signed   By: Jackey Loge D.O.   On: 09/09/2020 10:36  CT HEAD WO CONTRAST ( )  Result Date: 09/19/2020 CLINICAL DATA:  Left eye pain and EXAM: CT HEAD WITHOUT CONTRAST TECHNIQUE: Contiguous axial images were obtained from the base of the skull through the vertex without intravenous contrast. COMPARISON:  09/13/2020 FINDINGS: Brain: The known hypodensity within the left half of the medulla is again visualized and stable in appearance. No findings to suggest acute hemorrhage, acute infarction or space-occupying mass lesion are noted. Vascular: No hyperdense vessel or unexpected calcification. Skull: Normal. Negative for fracture or focal lesion. Sinuses/Orbits: Mild mucosal thickening is noted within the ethmoid and frontal sinuses. Other: None. IMPRESSION: Stable hypodensity within the left medulla similar to that seen on prior exam. No new acute abnormality is noted. Electronically Signed   By: Alcide Clever M.D.   On: 09/19/2020 19:57   CT HEAD WO CONTRAST ( )  Result Date: 09/13/2020 CLINICAL DATA:  Headache and right upper extremity numbness. EXAM: CT HEAD WITHOUT CONTRAST TECHNIQUE: Contiguous axial images were obtained from the base of the skull through the vertex without intravenous contrast. COMPARISON:  CT head from yesterday. FINDINGS: Brain: Unchanged subtle hypodensity in the left medulla consistent with known infarct. No hemorrhage by CT. No hydrocephalus, extra-axial collection or mass lesion/mass effect. Vascular: No hyperdense vessel or unexpected calcification. Skull: Normal. Negative for fracture or focal lesion. Sinuses/Orbits: Unchanged mucosal thickening of the ethmoid air cells and right frontal sinus. Other: None. IMPRESSION: 1. Unchanged subtle  hypodensity in the left medulla consistent with known infarct. No hemorrhage by CT. Electronically Signed   By: Obie Dredge M.D.   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 EXAM: CT HEAD WITHOUT CONTRAST TECHNIQUE: Contiguous axial images were obtained from the base of the skull through the vertex without intravenous contrast. COMPARISON:  Head CT earlier today.  Brain MRI yesterday. FINDINGS: Brain: The known left medullary infarct is only faintly visualized by CT. The petechial hemorrhage on MRI is not seen. No additional area of acute ischemia. No hemorrhage or hydrocephalus. No midline shift or mass effect. No extra-axial or subdural collection. Vascular: No intracranial hyperdense vessel. Skull: No fracture or focal lesion. Sinuses/Orbits: Mucosal thickening throughout the ethmoid air cells and right frontal sinus, stable from prior. No mastoid effusion. Other: None. IMPRESSION: 1. The known left medullary infarct is only faintly visualized. The petechial hemorrhage on MRI is not seen by CT. 2. No new abnormality. Electronically Signed   By: Narda Rutherford M.D.   On: 09/12/2020 21:52   CT HEAD WO CONTRAST ( )  Result Date: 09/12/2020 CLINICAL DATA:  Stroke follow-up EXAM: CT HEAD WITHOUT CONTRAST TECHNIQUE: Contiguous axial images were obtained from the base of the skull through the vertex without intravenous contrast. COMPARISON:  Brain MRI 09/11/2020 FINDINGS: Brain: There is no mass, hemorrhage or extra-axial collection. The size and configuration of the ventricles and extra-axial CSF spaces are normal. Faint hypoattenuation at the left medulla oblongata. Vascular: No abnormal hyperdensity of the major intracranial arteries or dural venous sinuses. No intracranial atherosclerosis. Skull: The visualized skull base, calvarium and  extracranial soft tissues are normal. Sinuses/Orbits: No fluid levels or advanced mucosal thickening of the visualized paranasal sinuses. No mastoid or middle ear effusion. The orbits are normal. IMPRESSION: 1. No acute intracranial hemorrhage. 2. Faint hypoattenuation at the left medulla oblongata, compatible with recent infarct. Electronically Signed   By: Deatra Robinson  M.D.   On: 09/12/2020 02:19   MR BRAIN WO CONTRAST  Result Date: 09/11/2020 CLINICAL DATA:  Neuro deficit, acute, stroke suspected. Left vertebral artery dissection with left lateral medullary stroke and worsening headache. EXAM: MRI HEAD WITHOUT CONTRAST TECHNIQUE: Multiplanar, multiecho pulse sequences of the brain and surrounding structures were obtained without intravenous contrast. COMPARISON:  CT angiography same day.  MRI 09/09/2020. FINDINGS: Brain: Acute infarction of the left medulla appears more extensive compared to the study of 2 days ago. This may simply represent evolutionary change of the infarction or could represent actual extension. Patient now has clear T2 and FLAIR signal abnormality in the region of the infarction. Mild petechial blood products are present based on the SWI imaging. No frank hematoma. Otherwise, the brain appears normal. No cerebellar or occipital lobe stroke. Cerebral hemispheres appear entirely normal. No hydrocephalus or extra-axial collection. Vascular: Major vessels at the base of the brain show flow. Skull and upper cervical spine: Negative Sinuses/Orbits: Continued sinus inflammatory changes, particularly of the right frontal sinus. Other: None IMPRESSION: More conspicuous signal abnormality in the left medulla that could possibly be evolutionary change of the previous stroke, though some extension is not excluded. Petechial blood products in the region without measurable hematoma. Electronically Signed   By: Paulina Fusi M.D.   On: 09/11/2020 18:52   MR BRAIN WO CONTRAST  Result Date:  09/09/2020 CLINICAL DATA:  Neuro deficit, acute, stroke suspected; dizziness and weakness after chiropractor visit; abnormal CTA EXAM: MRI HEAD WITHOUT CONTRAST TECHNIQUE: Multiplanar, multiecho pulse sequences of the brain and surrounding structures were obtained without intravenous contrast. COMPARISON:  None. FINDINGS: Brain: Focal reduced diffusion at the left lateral aspect of the medulla. Ventricles and sulci are normal in size and configuration. A few small foci of T2 hyperintensity in the cerebral white matter likely reflect nonspecific gliosis/demyelination of doubtful clinical significance. There is no evidence of intracranial hemorrhage. There is no intracranial mass, mass effect, or edema. There is no hydrocephalus or extra-axial fluid collection. Vascular: Major vessel flow voids at the skull base are preserved. Skull and upper cervical spine: Normal marrow signal is preserved. Sinuses/Orbits: Paranasal sinuses are aerated. Orbits are unremarkable. Other: Sella is unremarkable.  Mastoid air cells are clear. IMPRESSION: Small acute stroke of the lateral left medulla. These results were called by telephone at the time of interpretation on 09/09/2020 at 1:11 pm to provider [facility] , who verbally acknowledged these results. Electronically Signed   By: Guadlupe Spanish M.D.   On: 09/09/2020 13:12   MR Cervical Spine Wo Contrast  Result Date: 09/09/2020 CLINICAL DATA:  Cervical radiculopathy caudal right flecks; numbness face and neck after chiropractor adjustment EXAM: MRI CERVICAL SPINE WITHOUT CONTRAST TECHNIQUE: Multiplanar, multisequence MR imaging of the cervical spine was performed. No intravenous contrast was administered. COMPARISON:  None. FINDINGS: Alignment: No significant listhesis. Vertebrae: Vertebral body heights are maintained. No marrow edema. No suspicious osseous lesion Cord: Normal caliber and signal. Posterior Fossa, vertebral arteries, paraspinal tissues: Possible crescentic  abnormal signal is present about the left V2 vertebral artery at the C3-C4 to C5 levels. May reflect subintimal hematoma of dissection seen on CTA. Otherwise unremarkable. Disc levels: Minimal central protrusion at C6-C7. There is no canal or foraminal stenosis at any level. IMPRESSION: No abnormal cord signal.  No canal or foraminal stenosis. Electronically Signed   By: Guadlupe Spanish M.D.   On: 09/09/2020 13:17   DG CHEST PORT 1 VIEW  Result Date: 09/14/2020 CLINICAL DATA:  Stroke. EXAM:  PORTABLE CHEST 1 VIEW COMPARISON:  09/11/2020 FINDINGS: Heart is mildly enlarged and accentuated by technique. Lungs are clear. No edema. IMPRESSION: No active disease. Electronically Signed   By: Norva Pavlov M.D.   On: 09/14/2020 10:50   DG Chest Port 1 View  Result Date: 09/11/2020 CLINICAL DATA:  Chest pain. EXAM: PORTABLE CHEST 1 VIEW COMPARISON:  None. FINDINGS: Cardiomegaly. No focal consolidation, pleural effusion, or pneumothorax. No acute osseous abnormality. IMPRESSION: 1. Cardiomegaly. No active disease. Electronically Signed   By: Obie Dredge M.D.   On: 09/11/2020 07:22   ECHOCARDIOGRAM COMPLETE  Result Date: 09/14/2020    ECHOCARDIOGRAM REPORT   Patient Name:   Curtis [name] Date of Exam: 09/14/2020 Medical Rec #:  [id number]        Height:       72.0 in Accession #:    [id number]       Weight:       306.9 lb Date of Birth:  [birth year]        BSA:          2.556 m Patient Age:    35 years         BP:           123/98 mmHg Patient Gender: M                HR:           89 bpm. Exam Location:  Inpatient Procedure: 2D Echo, Cardiac Doppler and Color Doppler Indications:    Stroke  History:        Patient has no prior history of Echocardiogram examinations.  Sonographer:    Roosvelt Maser RDCS Referring Phys: [id number] JINDONG XU IMPRESSIONS  1. Left ventricular ejection fraction, by estimation, is 65 to 70%. The left ventricle has normal function. The left ventricle has no regional wall motion  abnormalities. Left ventricular diastolic parameters were normal.  2. Right ventricular systolic function is normal. The right ventricular size is normal.  3. There appears to be an atrial septal defect by color doppler flow. Suggest TEE and bubble study for further evaluation. . Evidence of atrial level shunting detected by color flow Doppler.  4. The mitral valve is grossly normal. No evidence of mitral valve regurgitation.  5. The aortic valve is normal in structure. Aortic valve regurgitation is not visualized. No aortic stenosis is present. FINDINGS  Left Ventricle: Left ventricular ejection fraction, by estimation, is 65 to 70%. The left ventricle has normal function. The left ventricle has no regional wall motion abnormalities. The left ventricular internal cavity size was normal in size. There is  borderline left ventricular hypertrophy. Left ventricular diastolic parameters were normal. Right Ventricle: The right ventricular size is normal. Right vetricular wall thickness was not well visualized. Right ventricular systolic function is normal. Left Atrium: Left atrial size was normal in size. Right Atrium: Right atrial size was normal in size. Pericardium: There is no evidence of pericardial effusion. Mitral Valve: The mitral valve is grossly normal. No evidence of mitral valve regurgitation. Tricuspid Valve: The tricuspid valve is grossly normal. Tricuspid valve regurgitation is trivial. No evidence of tricuspid stenosis. Aortic Valve: The aortic valve is normal in structure. Aortic valve regurgitation is not visualized. No aortic stenosis is present. Pulmonic Valve: The pulmonic valve was normal in structure. Pulmonic valve regurgitation is not visualized. Aorta: The aortic root and ascending aorta are structurally normal, with no evidence of dilitation. IAS/Shunts: Evidence of atrial level shunting detected by  color flow Doppler.  LEFT VENTRICLE PLAX 2D LVIDd:         4.60 cm  Diastology LVIDs:          2.70 cm  LV e' medial:    8.49 cm/s LV PW:         1.20 cm  LV E/e' medial:  6.5 LV IVS:        1.00 cm  LV e' lateral:   9.25 cm/s LVOT diam:     2.10 cm  LV E/e' lateral: 5.9 LVOT Area:     3.46 cm  RIGHT VENTRICLE RV Basal diam:  3.60 cm LEFT ATRIUM           Index       RIGHT ATRIUM           Index LA diam:      3.50 cm 1.37 cm/m  RA Area:     18.10 cm LA Vol (A2C): 43.3 ml 16.94 ml/m RA Volume:   49.60 ml  19.41 ml/m LA Vol (A4C): 48.4 ml 18.94 ml/m   AORTA Ao Root diam: 3.00 cm Ao Asc diam:  2.80 cm MITRAL VALVE MV Area (PHT): 3.99 cm    SHUNTS MV Decel Time: 190 msec    Systemic Diam: 2.10 cm MV E velocity: 54.80 cm/s MV A velocity: 78.40 cm/s MV E/A ratio:  0.70 Kristeen Miss MD Electronically signed by Kristeen Miss MD Signature Date/Time: 09/14/2020/3:59:40 PM    Final     Labs:  Basic Metabolic Panel: No results for input(s): NA, K, CL, CO2, GLUCOSE, BUN, CREATININE, CALCIUM, MG, PHOS in the last 168 hours.  CBC: No results for input(s): WBC, NEUTROABS, HGB, HCT, MCV, PLT in the last 168 hours.  CBG: Recent Labs  Lab 09/20/20 1623 09/22/20 1627  GLUCAP 89 155*    Brief HPI:   Curtis [name] is a 35 y.o. right-handed male with history of obesity BMI 36.62.  Per chart review lives with spouse including 70-month-old daughter.  Independent prior to admission.  Works full-time from home.  Presented to [facility] 09/09/2020 left-sided numbness headache dizziness as well as nausea x1 week.  Per report recently presented to chiropractor 2 weeks ago for regular back adjustments.  Denied having any symptoms at that time however he had some persistent on and off neck pain x3 weeks.  MRI of the brain showed a small acute stroke of the lateral left medulla.  CTA head neck irregularity of the V2 segment of the left vertebral artery with sites of up to mild to moderate stenosis.  Irregularity of the V4 left vertebral artery with sites of up to moderate/severe stenosis.  MRI of the brain follow-up more  conspicuous signal abnormality in the left medulla that could possibly evolutionary change of previous stroke.  CT angiogram head and neck repeated 09/11/2020 showing persistent findings of left vertebral artery dissection extracranially and intracranially.  Slightly increased narrowing at the level of the C2 transverse foramen.  Neurology follow-up maintained on aspirin and Plavix x3 months.  Patient would require repeat vascular imaging in 3 months to determine need to continue DAPT.  Therapy evaluations completed due to patient decreased functional mobility was admitted for a comprehensive rehab program.   Hospital Course: SOPHIA CUBERO was admitted to rehab 09/13/2020 for inpatient therapies to consist of PT, ST and OT at least three hours five days a week. Past admission physiatrist, therapy team and rehab RN have worked together to provide customized collaborative inpatient rehab.  Pertain to patient's left vertebral dissection after cervical chiropractic manipulation resulting in a left lateral medullary infarct remained stable follow-up neurology services remain on aspirin and Plavix x3 months then repeat vascular imaging to determine need to continue DAPT.  Pain management with the use of Neurontin scheduled as well as Robaxin.  Patient did receive trigger point injections for left scalenes, tight musculature.  Blood pressure remained controlled.  During hospital work-up PFO noted on transthoracic echo right to left shunt noted.  Unlikely cause of current stroke no further work-up per neurology services.  Cranial nerve IV palsy left expect this to improve as it is outside of the main infarct location he was wearing an eye patch.  Morbid obesity BMI 41.62 dietary follow-up.   Blood pressures were monitored on TID basis and controlled    Rehab course: During patient's stay in rehab weekly team conferences were held to monitor patient's progress, set goals and discuss barriers to discharge. At  admission, patient required min mod assist 75 feet rolling walker minimal assist stand pivot transfers minimal assist sit to stand  Physical exam.  Blood pressure 140/79 pulse 72 temperature 98.3 respirations 18 oxygen saturations 100% room air Constitutional.  No acute distress HEENT Head.  Normocephalic and atraumatic Eyes.  Pupils round and reactive to light no discharge.nystagmus Neck.  Supple nontender no JVD without thyromegaly Cardiac regular rate rhythm any extra sounds or murmur heard Abdomen.  Soft nontender positive bowel sounds without rebound Respiratory effort normal no respiratory distress without wheeze Skin.  Warm and dry Neurologic.  Alert oriented x3 normal insight and awareness normal language and speech.  CN notable for diplopia with tracking to all fields, 2-3 beats of nystagmus with left lateral gaze.  Mild perioral sensory loss as well as decreased light touch left upper extremity and decreased LT/PP right upper extremity.  Motor essentially 5/5 in upper extremities and lower extremities.  Mild ataxia left greater than right DTRs 1+  He/She  has had improvement in activity tolerance, balance, postural control as well as ability to compensate for deficits. He/She has had improvement in functional use RUE/LUE  and RLE/LLE as well as improvement in awareness.  Supine to sit modified independent level.  Able to don shoes edge of bed with set up sit to stand contact-guard without assistive device ambulates 175 feet minimal assist no assistive device gait deficits including mild left lean mild ataxia.  Completes sit to stand a Rollator with supervision ambulates in the hallways.  Supervision for ADLs secondary to balance.  He completed simulated walk-in shower transfer will use a rolling walker shower seat modified independent.  Full family teaching completed plan discharged to home       Disposition: Discharged to home    Diet: Regular  Special Instructions: No driving  smoking or alcohol  Continue aspirin 81 mg daily and Plavix 75 mg daily x3 months then repeat vascular imaging to determine need to continue DAPT  Medications at discharge 1.  Tylenol three 1 tablet every 6 hours as needed moderate pain 2.  Aspirin 81 mg p.o. daily 3.  Lipitor 40 mg p.o. daily 4.  Plavix 75 mg p.o. daily 5.  Neurontin 300 mg p.o. 3 times daily 6.  Lidoderm patch as directed 7.  Antivert 50 mg p.o. 3 times daily 8.  Robaxin 700 mg p.o. every 8 hours as needed muscle spasms   30-35 minutes were spent completing discharge summary and discharge planning  Discharge Instructions     Ambulatory  referral to Neurology   Complete by: As directed    An appointment is requested in approximately: 4 weeks left vertebral dissection   Ambulatory referral to Physical Medicine Rehab   Complete by: As directed    Moderate complexity follow-up 1 to 2 weeks left vertebral dissection        Follow-up Information     Kirsteins, Victorino SparrowAndrew E, MD Follow up.   Specialty: Physical Medicine and Rehabilitation Why: Office to call for appointment Contact information: [address] [place] [address] [phone]         [facility]. Schedule an appointment as soon as possible for a visit in 1 month(s).   Specialty: Neurology Why: stroke clinic Contact information: [address] [place] [id number] [phone]                Signed: Charlton AmorDaniel J Macintyre Alexa 09/26/2020, 5:36 AM

## 2020-09-24 NOTE — Progress Notes (Signed)
PROGRESS NOTE   Subjective/Complaints:   Pt reports neck is still tight.  PT reports they think his L first rib is out.  Took tylenol for pain behind L eye- this AM- easing off.  Shoulder a LOT better, neck a little better-  Wants more trigger point injections.   ROS:  Pt denies SOB, abd pain, CP, N/V/C/D, and vision changes  Objective:   No results found. No results for input(s): WBC, HGB, HCT, PLT in the last 72 hours.  No results for input(s): NA, K, CL, CO2, GLUCOSE, BUN, CREATININE, CALCIUM in the last 72 hours.   Intake/Output Summary (Last 24 hours) at 09/24/2020 1054 Last data filed at 09/24/2020 0900 Gross per 24 hour  Intake 480 ml  Output --  Net 480 ml         Physical Exam: Vital Signs Blood pressure 100/64, pulse 78, temperature 98.4 F (36.9 C), temperature source Oral, resp. rate 17, height 6' (1.829 m), weight (!) 139.2 kg, SpO2 98 %.       General: awake, alert, appropriate, seen twice- up in bedside chair 2nd time- laying flat in bed first time; NAD HENT: conjugate gaze; oropharynx moist CV: regular rate; no JVD Pulmonary: CTA B/L; no W/R/R- good air movement GI: soft, NT, ND, (+)BS Psychiatric: appropriate Neurological: Ox3  Ext: no clubbing, cyanosis, or edema Psych: pleasant and cooperative  Neurologic: Mild skew deviation, cranial nerve IV, motor strength is 5/5 in bilateral deltoid, bicep, tricep, grip, hip flexor, knee extensors, ankle dorsiflexor and plantar flexor Sensory exam normal sensation to light touch  in bilateral upper and lower extremities Cerebellar exam mild dysmetria left finger-nose-finger Musculoskeletal: left trap more relaxed. Levator scap,scalenes still tight as well as L SCM very tight.   Assessment/Plan: 1. Functional deficits which require 3+ hours per day of interdisciplinary therapy in a comprehensive inpatient rehab setting. Physiatrist is providing  close team supervision and 24 hour management of active medical problems listed below. Physiatrist and rehab team continue to assess barriers to discharge/monitor patient progress toward functional and medical goals  Care Tool:  Bathing    Body parts bathed by patient: Right arm, Left arm         Bathing assist Assist Level: Minimal Assistance - Patient > 75%     Upper Body Dressing/Undressing Upper body dressing   What is the patient wearing?: Pull over shirt    Upper body assist Assist Level: Set up assist    Lower Body Dressing/Undressing Lower body dressing      What is the patient wearing?: Pants, Underwear/pull up     Lower body assist Assist for lower body dressing: Minimal Assistance - Patient > 75%     Toileting Toileting Toileting Activity did not occur (Clothing management and hygiene only): N/A (no void or bm)  Toileting assist Assist for toileting: Minimal Assistance - Patient > 75%     Transfers Chair/bed transfer  Transfers assist     Chair/bed transfer assist level: Contact Guard/Touching assist Chair/bed transfer assistive device: Armrests   Locomotion Ambulation   Ambulation assist      Assist level: Contact Guard/Touching assist Assistive device: No Device Max distance: 500'  Walk 10 feet activity   Assist     Assist level: Contact Guard/Touching assist Assistive device: No Device   Walk 50 feet activity   Assist    Assist level: Contact Guard/Touching assist Assistive device: No Device    Walk 150 feet activity   Assist    Assist level: Contact Guard/Touching assist Assistive device: No Device    Walk 10 feet on uneven surface  activity   Assist Walk 10 feet on uneven surfaces activity did not occur: Safety/medical concerns         Wheelchair     Assist Is the patient using a wheelchair?: No   Wheelchair activity did not occur: N/A         Wheelchair 50 feet with 2 turns  activity    Assist    Wheelchair 50 feet with 2 turns activity did not occur: N/A       Wheelchair 150 feet activity     Assist  Wheelchair 150 feet activity did not occur: N/A       Blood pressure 100/64, pulse 78, temperature 98.4 F (36.9 C), temperature source Oral, resp. rate 17, height 6' (1.829 m), weight (!) 139.2 kg, SpO2 98 %.  Medical Problem List and Plan: 1.  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  Antithrombotics: -DVT/anticoagulation: SCDs               -antiplatelet therapy: Aspirin 81 mg daily and Plavix 75 mg daily x3 months then repeat vascular imaging to determine need to continue DAPT 3. Pain Management: Neurontin 300 mg 3 times daily, Tylenol 3 as needed             -tylenol has generally been effective for intermittent headaches  8/24- will try Robaxin 750 mg q8 hours prn- and lidoderm patch on L side of neck- 8pm to 8am- if not effective, will try Trigger point injections tomorrow  8/25- wil wait on trigger point injections, things are ~50% better- con't regimen  8/27- will do trigger point injections tomorrow for L scalenes, tight musculature  8/29 left shoulder/neck partially improved after TPI's. Consider repeat injections tomorrow per Dr. Berline Chough   -discussed some stretches today  8/30- trigger point injections today and follow with muscle relaxant.  4. Mood: Provide emotional support             -antipsychotic agents: N/A   - mood is stable, but would likely benefit from stroke support group after d/c.  5. Neuropsych: This patient is capable of making decisions on his own behalf. 6. Skin/Wound Care: Routine skin checks 7. Fluids/Electrolytes/Nutrition: Routine in and outs with  follow-up chemistries on admit 8.  PFO noted on transthoracic echo, right to left shunt noted.  Unlikely cause of current stroke no further w/u per neuro, may f/u with cardiology as OP  9.  CN 4 palsy left , expect this to improve as it is outside of the main infarct location   8/29- wearing patch- alternating- con't regimen    10. Morbid Obesity  8/25- BMI is 41.62- counseling and diet/exercise given 11. Slow transit constipation  -sorbitol 60cc  today, SSE if needed     Patient here for trigger point injections for myofascial pain  Consent done and on chart.  Cleaned areas with alcohol and injected using a 27 gauge 1.5 inch needle  Injected  3cc Using 1% Lidocaine with no EPI  Upper traps Levators L only Posterior scalenes L only x2 Middle scalenes L only x2 Splenius Capitus L only Pectoralis Major Rhomboids Infraspinatus Teres Major/minor Thoracic paraspinals Lumbar paraspinals Other injections- L SCM x2   Patient's level of pain prior was 7 Current level of pain after injections is 4- 4.5/10  There was no bleeding or complications.  Patient was advised to drink a lot of water on day after injections to flush system Will have increased soreness for 12-48 hours after injections.  Can use Lidocaine patches the day AFTER injections Can use theracane on day of injections in places didn't inject Can use heating pad 4-6 hours AFTER injections  I spent a total of 40 minutes on total care- >50% on coordination of care- calling Dr Antoine Primas and doing trigger point injections.  Also d/w PT  LOS: 11 days A FACE TO FACE EVALUATION WAS PERFORMED  Aeric Burnham 09/24/2020, 10:54 AM

## 2020-09-24 NOTE — Progress Notes (Signed)
Physical Therapy Session Note  Patient Details  Name: Curtis [name] MRN: [id number] Date of Birth: January 11, 1986  Today's Date: 09/24/2020 PT Individual Time: 1030-1127 PT Individual Time Calculation (min): 57 min   Short Term Goals: Week 2:  PT Short Term Goal 1 (Week 2): = to LTGs based on ELOS  Skilled Therapeutic Interventions/Progress Updates:     Pt in recliner at start of session - agreeable to PT tx. He's reporting 7/10 L upper trap/scalene pain and some moderate dizziness. MD arriving to perform trigger point release - reporting gradual decrease in pain to 4.5/10. Sit<>stand to rollator with supervision. Ambulates with CGA and rollator to main rehab gym - some increased unsteadiness this session compared to yesterdays, likely contributed by dizziness and pain. Worked on standing chop/lifts with 3kg med ball with CGA for balance - causing increased dizziness and therefore provided rest break. Then worked on seated external rotation with 5# dumbbell and pillow under axilla - able to complete  2 sets of 7-8 reps before L shoulder fatigue and onset of dizziness. Completed stand<>pivot transfer with CGA to arm chair to allow support to improve his dizziness and symptoms - mild relief. Ambulated back to his room with CGA and rollator and assisted back to bed with supervision. Pt reporting improvement in sx while lying supine. Extra time throughout session needed due to his symptoms and pain. Ended session with all needs in reach at end of session.   Therapy Documentation Precautions:  Precautions Precautions: Fall Precaution Comments: Wallenburg Syndrome. Nausea/dizziness, diplopia, rotating eye patch Restrictions Weight Bearing Restrictions: No General:    Therapy/Group: Individual Therapy  Orrin Brigham 09/24/2020, 7:36 AM

## 2020-09-24 NOTE — Progress Notes (Signed)
Physical Therapy Session Note  Patient Details  Name: Curtis [name] MRN: [id number] Date of Birth: 1985/09/01  Today's Date: 09/24/2020 PT Individual Time: 0865-7846 PT Individual Time Calculation (min): 60 min   Short Term Goals: Week 2:  PT Short Term Goal 1 (Week 2): = to LTGs based on ELOS  Skilled Therapeutic Interventions/Progress Updates:    Pt received supine in bed and agreeable to therapy session. Pt states he is feeling much better compared to this AM. Supine>sitting R EOB independently - donned tennis shoes independently. Pt reports no questions/concerns following family education yesterday and states he feels it helped his wife feel more comfortable/confident with the upcoming D/C. Sit<>stands using rollator with supervision during session - pt demos excellent safety awareness and use of rollator brakes throughout session. Pt in agreement with recommendation to use rollator as opposed to RW upon D/C. Gait training >1,048ft to outside using rollator with supervision for safety throughout - demos only intermittent slight L lean but pt able to compensate and regain balance using B UE support or adjusting L LE step placement. Dynamic gait training outside using rollator including up/down ramps, on sideways sloped inclines/declines, up/down stairs with only 1 HR, and up/down curb steps - requires close supervision throughout with CGA during stair and curb navigation for safety. Educated on having family assist with rollator management prior to stair navigation and demonstrated during session. Educated on Financial planner up/down curb with pt requiring CGA for safety during this. During stair navigation pt used L UE support on HR each time (to have that stable support on his L side) and self-selected reciprocal pattern with good balance and safety awareness.  Gait training back up to CIR floor using rollator with supervision. Pt able to recall education on the signs/symptoms of a stroke  (BE FAST) with min cuing. Pt educated on stroke support group information. Provided patient with the below HEP and had pt perform 1 set of each of the following exercises while educating on safe set-up at kitchen sink with his wife providing CGA. Gait training back to room supervision using rollator. Sit>supine independently. Pt left supine in bed with needs in reach.  Access Code: [email or web address] URL: [email or web address] Date: 09/24/2020 Prepared by: Casimiro Needle  Exercises Walking - 1 x daily - 7 x weekly - 2 sets - 20 minutes hold Side Stepping with Counter Support - 1 x daily - 7 x weekly - 2 sets - 20 reps Alternating Step Taps with Counter Support - 1 x daily - 7 x weekly - 2 sets - 20 reps Standing Romberg to 1/2 Tandem Stance - 1 x daily - 7 x weekly - 2 sets - 30 seconds hold Backward Walking with Counter Support - 1 x daily - 7 x weekly - 2 sets - 15 reps Narrow Stance with Counter Support - 1 x daily - 7 x weekly - 2 sets - 30 seconds hold   Therapy Documentation Precautions:  Precautions Precautions: Fall Precaution Comments: Wallenburg Syndrome. Nausea/dizziness, diplopia, rotating eye patch Restrictions Weight Bearing Restrictions: No   Pain: Reports mild headache but no intervention needed at this time.  Therapy/Group: Individual Therapy  Ginny Forth , PT, DPT, NCS, CSRS 09/24/2020, 3:16 PM

## 2020-09-25 DIAGNOSIS — K5901 Slow transit constipation: Secondary | ICD-10-CM

## 2020-09-25 DIAGNOSIS — M542 Cervicalgia: Secondary | ICD-10-CM

## 2020-09-25 DIAGNOSIS — I7774 Dissection of vertebral artery: Secondary | ICD-10-CM

## 2020-09-25 MED ORDER — ATORVASTATIN CALCIUM 40 MG PO TABS: 40.0000 mg | ORAL_TABLET | Freq: Every day | ORAL | 0 refills | Status: DC

## 2020-09-25 MED ORDER — MECLIZINE HCL 50 MG PO TABS: 50.0000 mg | ORAL_TABLET | Freq: Three times a day (TID) | ORAL | 0 refills | Status: DC

## 2020-09-25 MED ORDER — ACETAMINOPHEN-CODEINE #3 300-30 MG PO TABS: 1.0000 | ORAL_TABLET | Freq: Four times a day (QID) | ORAL | 0 refills | Status: DC | PRN

## 2020-09-25 MED ORDER — CLOPIDOGREL BISULFATE 75 MG PO TABS: 75.0000 mg | ORAL_TABLET | Freq: Every day | ORAL | 0 refills | Status: DC

## 2020-09-25 MED ORDER — METHOCARBAMOL 750 MG PO TABS: 750.0000 mg | ORAL_TABLET | Freq: Three times a day (TID) | ORAL | 0 refills | Status: DC | PRN

## 2020-09-25 MED ORDER — GABAPENTIN 300 MG PO CAPS: 300.0000 mg | ORAL_CAPSULE | Freq: Three times a day (TID) | ORAL | 0 refills | Status: DC

## 2020-09-25 MED ORDER — LIDOCAINE 5 % EX PTCH: 1.0000 | MEDICATED_PATCH | CUTANEOUS | 0 refills | Status: DC

## 2020-09-25 NOTE — Progress Notes (Addendum)
Occupational Therapy Session Note  Patient Details  Name: Curtis [name] MRN: [id number] Date of Birth: [birth year]  Today's Date: 09/25/2020 OT Individual Time: 0930-1000 OT Individual Time Calculation (min): 30 min    Short Term Goals: Week 2:  OT Short Term Goal 1 (Week 2): Continue working on established LTGs set at modified independent to supervision.  Skilled Therapeutic Interventions/Progress Updates:    Pt semi reclined in bed, eye patch donned, reports 5/10 pain described as soreness related to recent trigger point injection left neck region.  Pt also reports 3/10 dizziness primarily during change in position throughout session.  Pt ambulated approximately 200 feet to large gym needing one occasion of CGA due decreased foot clearance and mild LOB, then close supervision thereafter without use of AD. Assessed MMT LUE: Shoulder 3+/5 all planes except 4+/5 IR; distal to shoulder 4/5. Shoulder movements slightly ataxic.  Educated pt on HEP: Supine EOM shoulder horizontal abduction/adduction, seated EOM shoulder ER at 0 degrees abduction, shoulder scaption (to 90 degrees only), biceps curls, triceps pull downs, scapular retraction rows.  Pt completed 1 x 10 reps each. HEP provided directing pt on 3x 10 reps once per day, however due to time constraints only one set completed at this time. Educated pt on seating shoulder by depressing scapula to reduce pain and strain in upper traps, levator scapulae.  Ambulated back to room without AD needing close supervision when turning corners and verbal cues to improve gait pattern including increasing step length and foot clearance on left.  Pt semi reclined in bed, call bell in reach, bed alarm on at end of session.  Therapy Documentation Precautions:  Precautions Precautions: Fall Precaution Comments: Wallenburg Syndrome. Nausea/dizziness, diplopia, rotating eye patch Restrictions Weight Bearing Restrictions: No   Therapy/Group: Individual  Therapy  Amie Critchley 09/25/2020, 11:54 AM

## 2020-09-25 NOTE — Plan of Care (Signed)
  Problem: RH Bed to Chair Transfers Goal: LTG Patient will perform bed/chair transfers w/assist (PT) Description: LTG: Patient will perform bed to chair transfers with assistance (PT). Outcome: Not Met (add Reason)   Problem: RH Balance Goal: LTG Patient will maintain dynamic sitting balance (PT) Description: LTG:  Patient will maintain dynamic sitting balance with assistance during mobility activities (PT) Outcome: Completed/Met Goal: LTG Patient will maintain dynamic standing balance (PT) Description: LTG:  Patient will maintain dynamic standing balance with assistance during mobility activities (PT) Outcome: Completed/Met   Problem: Sit to Stand Goal: LTG:  Patient will perform sit to stand with assistance level (PT) Description: LTG:  Patient will perform sit to stand with assistance level (PT) Outcome: Completed/Met   Problem: RH Bed Mobility Goal: LTG Patient will perform bed mobility with assist (PT) Description: LTG: Patient will perform bed mobility with assistance, with/without cues (PT). Outcome: Completed/Met   Problem: RH Car Transfers Goal: LTG Patient will perform car transfers with assist (PT) Description: LTG: Patient will perform car transfers with assistance (PT). Outcome: Completed/Met   Problem: RH Ambulation Goal: LTG Patient will ambulate in controlled environment (PT) Description: LTG: Patient will ambulate in a controlled environment, # of feet with assistance (PT). Outcome: Completed/Met Goal: LTG Patient will ambulate in home environment (PT) Description: LTG: Patient will ambulate in home environment, # of feet with assistance (PT). Outcome: Completed/Met   Problem: RH Stairs Goal: LTG Patient will ambulate up and down stairs w/assist (PT) Description: LTG: Patient will ambulate up and down # of stairs with assistance (PT) Outcome: Completed/Met

## 2020-09-25 NOTE — Progress Notes (Signed)
PROGRESS NOTE   Subjective/Complaints: Patient seen laying in bed this morning.  He states he slept well overnight.  He has several questions in anticipation for discharge, including blood pressure, bowel movements, activity tolerance, stroke etiology, diet, exercise, follow-up appointments, etc.  ROS:  Denies CP, SOB, N/V/D  Objective:   No results found. No results for input(s): WBC, HGB, HCT, PLT in the last 72 hours.  No results for input(s): NA, K, CL, CO2, GLUCOSE, BUN, CREATININE, CALCIUM in the last 72 hours.   Intake/Output Summary (Last 24 hours) at 09/25/2020 1131 Last data filed at 09/25/2020 0830 Gross per 24 hour  Intake 480 ml  Output --  Net 480 ml          Physical Exam: Vital Signs Blood pressure 121/72, pulse 78, temperature 98.9 F (37.2 C), resp. rate 14, height 6' (1.829 m), weight (!) 139.2 kg, SpO2 98 %. Constitutional: No distress . Vital signs reviewed. HENT: Normocephalic.  Atraumatic. Eyes: EOMI. No discharge. Cardiovascular: No JVD.  RRR. Respiratory: Normal effort.  No stridor.  Bilateral clear to auscultation. GI: Non-distended.  BS +. Skin: Warm and dry.  Intact. Psych: Normal mood.  Normal behavior. Musc: No edema in extremities.  No tenderness in extremities. Neuro: Alert and oriented Cerebellar exam mild dysmetria left finger-nose-finger  Assessment/Plan: 1. Functional deficits which require 3+ hours per day of interdisciplinary therapy in a comprehensive inpatient rehab setting. Physiatrist is providing close team supervision and 24 hour management of active medical problems listed below. Physiatrist and rehab team continue to assess barriers to discharge/monitor patient progress toward functional and medical goals  Care Tool:  Bathing    Body parts bathed by patient: Right arm, Left arm         Bathing assist Assist Level: Minimal Assistance - Patient > 75%     Upper  Body Dressing/Undressing Upper body dressing   What is the patient wearing?: Pull over shirt    Upper body assist Assist Level: Set up assist    Lower Body Dressing/Undressing Lower body dressing      What is the patient wearing?: Pants, Underwear/pull up     Lower body assist Assist for lower body dressing: Minimal Assistance - Patient > 75%     Toileting Toileting Toileting Activity did not occur (Clothing management and hygiene only): N/A (no void or bm)  Toileting assist Assist for toileting: Minimal Assistance - Patient > 75%     Transfers Chair/bed transfer  Transfers assist     Chair/bed transfer assist level: Supervision/Verbal cueing Chair/bed transfer assistive device: Other (rollator)   Locomotion Ambulation   Ambulation assist      Assist level: Supervision/Verbal cueing Assistive device: Rollator Max distance: 1,032ft   Walk 10 feet activity   Assist     Assist level: Supervision/Verbal cueing Assistive device: Rollator   Walk 50 feet activity   Assist    Assist level: Supervision/Verbal cueing Assistive device: Rollator    Walk 150 feet activity   Assist    Assist level: Supervision/Verbal cueing Assistive device: Rollator    Walk 10 feet on uneven surface  activity   Assist Walk 10 feet on uneven surfaces activity  did not occur: Safety/medical concerns   Assist level: Supervision/Verbal cueing Assistive device: Rollator   Wheelchair     Assist Is the patient using a wheelchair?: No   Wheelchair activity did not occur: N/A         Wheelchair 50 feet with 2 turns activity    Assist    Wheelchair 50 feet with 2 turns activity did not occur: N/A       Wheelchair 150 feet activity     Assist  Wheelchair 150 feet activity did not occur: N/A       Blood pressure 121/72, pulse 78, temperature 98.9 F (37.2 C), resp. rate 14, height 6' (1.829 m), weight (!) 139.2 kg, SpO2 98 %.  Medical  Problem List and Plan: 1.  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  Antithrombotics: -DVT/anticoagulation: SCDs               -antiplatelet therapy: Aspirin 81 mg daily and Plavix 75 mg daily x3 months then repeat vascular imaging to determine need to continue DAPT 3. Pain Management: Neurontin 300 mg 3 times daily, Tylenol 3 as needed             -tylenol has generally been effective for intermittent headaches  Robaxin 750 mg q8 hours prn- and lidoderm patch on L side of neck- 8pm to 8am  Trigger point injections performed on 8/30  Relatively controlled on 8/21 4. Mood: Provide emotional support             -antipsychotic agents: N/A   - mood is stable, but would likely benefit from stroke support group after d/c.  5. Neuropsych: This patient is capable of making decisions on his own behalf. 6. Skin/Wound Care: Routine skin checks 7. Fluids/Electrolytes/Nutrition: Routine in and outs with follow-up chemistries on admit 8.  PFO noted on transthoracic echo, right to left shunt noted.  Unlikely cause of current stroke no further w/u per neuro, may f/u with cardiology as OP  9.  CN 4 palsy left , expect this to improve as it is outside of the main infarct location   8/29- wearing patch- alternating- con't regimen 10. Morbid Obesity  8/25- BMI is 41.62- counseling and diet/exercise given  Continue to encourage 11. Slow transit constipation  Improving  LOS: 12 days A FACE TO FACE EVALUATION WAS PERFORMED  Vernette Moise Karis Juba 09/25/2020, 11:31 AM

## 2020-09-25 NOTE — Patient Care Conference (Signed)
Inpatient RehabilitationTeam Conference and Plan of Care Update Date: 09/25/2020   Time: 11:37 AM    Patient Name: Curtis [name]      Medical Record Number: [id number]  Date of Birth: 1985-02-16 Sex: Male         Room/Bed: 4W07C/4W07C-01 Payor Info: Payor: BLUE [employer] BLUE [employer]IELD / Plan: BCBS COMM PPO / Product Type: *No Product type* /    Admit Date/Time:  09/13/2020 12:48 PM  Primary Diagnosis:  Lateral medullary syndrome  Hospital Problems: Principal Problem:   Lateral medullary syndrome Active Problems:   ASD (atrial septal defect)   Slow transit constipation   Morbid obesity (HCC)   Neck pain    Expected Discharge Date: Expected Discharge Date: 09/26/20  Team Members Present: Physician leading conference: Dr. Maryla Morrow Social Worker Present: Lavera Guise, BSW Nurse Present: Chana Bode, RN PT Present: Casimiro Needle, PT OT Present: Perrin Maltese, OT SLP Present: Eilene Ghazi, SLP PPS Coordinator present : Fae Pippin, SLP     Current Status/Progress Goal Weekly Team Focus  Bowel/Bladder   Pt continent of B/B. LBM 09/23/2020 after dose of sorbitol  Regular BMs every 3 days or less  Assess B/B every shift and PRN   Swallow/Nutrition/ Hydration             ADL's   Supervision for UB selfcare with min guard for LB selfcare sit to stand.  Transfers min to min guard secondary to left bias and lean.  Still with dizziness and visual motor deficits resulting in diplopia, but improving  supervision to modified independent  selfcare retraining, balance retraining, vestibular retraining, DME education, functional mobility training, vision retraining   Mobility   independent bed mobility, supervision sit<>stands and stand pivot transfers using rollator, gait up to 1,05ft using rollator with supervision but with increased dizziness requires CGA due to L lean, CGA 8-12 stairs using 1 HR - continues to have vertical diplopia and dizziness  mod-I to supervision  overall at ambulatory level  activity tolerance, visual retraining with dual-task of balance and/or gait, dynamic gait training, DME education and training, pt/family education, midline orientation, dynamic standing balance   Communication             Safety/Cognition/ Behavioral Observations            Pain   Intermittent left neck pain. Managed well with lidoderm patch and PRN Tylenol codeine  Pain score of <3/10  Assess pain every shift and PRN   Skin   Skin intact  no new breakdown  Assess skin every shift and PRN     Discharge Planning:  D/c to home with intermittent support since wife has returned to work.   Team Discussion: Medically stable for discharge.  Patient on target to meet rehab goals: yes, currently needs supervision for sit - stand for grooming and use of a rollator. Note increased dizziness with challenging tasks although no pattern to the dizziness activation. Has resting nystagmus and referred to neuro optometrist for follow up after discharge. Able to ambulate >1000' with CGA for instability and needs rails for steps.   *See Care Plan and progress notes for long and short-term goals.   Revisions to Treatment Plan:    Teaching Needs: Safety, medications, etc.   Current Barriers to Discharge: Decreased caregiver support and Home enviroment access/layout  Possible Resolutions to Barriers: Family education completed OP follow up services     Medical Summary Current Status: Lateral medullary syndrome, left sided cranial nerve 4 palsy, cervical  myofasical pain, PFO on TTE, obesity (BMI 41.62), dizziness, resting nystagmus  Barriers to Discharge: Weight;Medical stability   Possible Resolutions to [employer] Focus: Therapies, optimize pain meds, optimize bowel meds, family edu   Continued Need for Acute Rehabilitation Level of Care: The patient requires daily medical management by a physician with specialized training in physical medicine and  rehabilitation for the following reasons: Direction of a multidisciplinary physical rehabilitation program to maximize functional independence : Yes Medical management of patient stability for increased activity during participation in an intensive rehabilitation regime.: Yes Analysis of laboratory values and/or radiology reports with any subsequent need for medication adjustment and/or medical intervention. : Yes   I attest that I was present, lead the team conference, and concur with the assessment and plan of the team.   Chana Bode B 09/25/2020, 2:06 PM

## 2020-09-25 NOTE — Progress Notes (Signed)
Patient ID: Curtis [name], male   DOB: [birth year], 35 y.o.   MRN: [id number] Team Conference Report to Patient/Family  Team Conference discussion was reviewed with the patient and caregiver, including goals, any changes in plan of care and target discharge date.  Patient and caregiver express understanding and are in agreement.  The patient has a target discharge date of 09/26/20.  SW met with patient and spouse in room, Provided conference updates. Pt medically stable for d/c tomorrow. Handicapped application provided. DME delivered. Pt set at [place] for follow up. Medical records number provided to pt spouse.   Dyanne Iha 09/25/2020, 1:26 PM

## 2020-09-25 NOTE — Progress Notes (Signed)
Recreational Therapy Session Note  Patient Details  Name: YOLTZIN BARG MRN: [id number] Date of Birth: 1985/09/01 Today's Date: 09/25/2020  Pain: no c/o  Pt participated in animal assisted activity bed level with supervision.  Wife present as well.  Pt response: Both easily engaged with pet partners team and appreciative of this visit. Sherlene Rickel 09/25/2020, 3:30 PM

## 2020-09-25 NOTE — Progress Notes (Signed)
Inpatient Rehabilitation Care Coordinator Discharge Note   Patient Details  Name: Curtis [name] MRN: [id number] Date of Birth: October 08, 1985   Discharge location: Home  Length of Stay: 13 Days  Discharge activity level: Supervision/MOD I  Home/community participation: Spouse able to provide supersvision in evenings  Patient response HQ:PRFFMB Literacy - How often do you need to have someone help you when you read instructions, pamphlets, or other written material from your doctor or pharmacy?: Rarely  Patient response WG:YKZLDJ Isolation - How often do you feel lonely or isolated from those around you?: Never  Services provided included: MD, RD, PT, OT, SLP, RN, CM, TR, Pharmacy, SW, Neuropsych  Financial Services:  Field seismologist Utilized: Media planner (BCBS COMM) BCBS COMM  Choices offered to/list presented to: Patient and Spouse  Follow-up services arranged:  Outpatient Home Health Agency: n/a  Outpatient Servicies: [facility]      Patient response to transportation need: Is the patient able to respond to transportation needs?: Yes In the past 12 months, has lack of transportation kept you from medical appointments or from getting medications?: No In the past 12 months, has lack of transportation kept you from meetings, work, or from getting things needed for daily living?: No    Comments (or additional information):  Patient/Family verbalized understanding of follow-up arrangements:  Yes  Individual responsible for coordination of the follow-up plan: Patient or spouse or [name] [phone]  Confirmed correct DME delivered: Andria Rhein 09/25/2020    Andria Rhein

## 2020-09-25 NOTE — Progress Notes (Signed)
Physical Therapy Discharge Summary  Patient Details  Name: Curtis [name] MRN: [id number] Date of Birth: Dec 11, 1985  Today's Date: 09/25/2020 PT Individual Time: 7893-8101 PT Individual Time Calculation (min): 57 min    Patient has met 8 of 9 long term goals due to improved activity tolerance, improved balance, improved postural control, increased strength, functional use of  left upper extremity and left lower extremity, improved awareness, and improved coordination.  Patient to discharge at an ambulatory level using rollator with Supervision.  Patient's care partner attended hands-on education/training and is independent to provide the necessary physical assistance at discharge.  Reasons goals not met: Pt continues to require supervision during bed<>chair transfers for safety due to dizziness symptoms causing worsening L lean.   Recommendation:  Patient will benefit from ongoing skilled PT services in outpatient setting to continue to advance safe functional mobility, address ongoing impairments in dynamic standing balance, dynamic gait training with LRAD, vision training, L LE NMR and coordination, and minimize fall risk.  Equipment: Rollator  Reasons for discharge: treatment goals met and discharge from hospital  Patient/family agrees with progress made and goals achieved: Yes  Skilled Therapeutic Interventions/Progress Updates:  Pt received sitting up in bed and agreeable to therapy session. Pt's personal rollator in room and utilized during session - ensured set at proper height. Bed mobility mod-I. Sit<>stands using rollator mod-I during session. Gait training ~29f to ortho gym using rollator with supervision. Simulated car transfer (small SUV height) using rollator with close supervision and education on sequencing/technique with AD. Gait training up/down ramp using rollator with supervision - pt able to recall need to use brakes to control speed of AD on decline. CSolicitorwith education on using brakes to tip rollator backwards and lift front wheels up onto the curb (as opposed to trying to pick up the whole rollator and place it up onto curb) - pt demonstrated understanding via navigating on/off a curb 2x with supervision for safety. [facility] demonstrates increased fall risk as noted by score of 44/56.  (<36= high risk for falls, close to 100%; 37-45 significant >80%; 46-51 moderate >50%; 52-55 lower >25%). Educated pt on results of test and reinforced importance of performing HEP upon D/C. Pt participated in Functional Gait Assessment (FGA) with score of 17/30 demonstrating high fall risk (low fall risk 25-28, medium fall risk 19-24, and high fall risk <19). Educated on results of test and need to use rollator at this time for increased safety with gait. Educated on fall risk safety and when to call 911. Pt reports no questions/concerns and reports feeling comfortable/confident with upcoming D/C. At end of session pt left seated EOB with needs in reach.  PT Discharge Precautions/Restrictions Precautions Precautions: Fall;Other (comment) Precaution Comments: [place] Syndrome. Nausea/dizziness, diplopia, rotating eye patch Restrictions Weight Bearing Restrictions: No Pain Pain Assessment Pain Scale: 0-10 Pain Score: 0-No pain Pain Interference Pain Interference Pain Effect on Sleep: 1. Rarely or not at all (premedicated) Pain Interference with Therapy Activities: 1. Rarely or not at all (pain has not limited participation) Pain Interference with Day-to-Day Activities: 1. Rarely or not at all Vision/Perception  Vision - History Ability to See in Adequate Light: 1 Impaired Perception Perception: Within Functional Limits Praxis Praxis: Intact  Cognition Overall Cognitive Status: Within Functional Limits for tasks assessed Arousal/Alertness: Awake/alert Orientation Level: Oriented X4 Year:  2022 Month: August Day of Week: Correct Attention: Focused;Sustained;Selective Focused Attention: Appears intact Sustained Attention: Appears intact Selective  Attention: Appears intact Memory: Appears intact Awareness: Appears intact Problem Solving: Appears intact Safety/Judgment: Appears intact Sensation Sensation Light Touch: Impaired Detail Central sensation comments: pt reports L UE and LE still have impaired sensation compared to R with pt reporting it feels as though there is a thin layer of something over the skin on his L side Light Touch Impaired Details: Impaired LUE;Impaired LLE Hot/Cold: Not tested Proprioception: Appears Intact Stereognosis: Not tested Coordination Gross Motor Movements are Fluid and Coordinated: No Coordination and Movement Description: Continues with slight L side ataxia Motor  Motor Motor: Ataxia;Abnormal postural alignment and control;Other (comment) Motor - Discharge Observations: Still with decrerased LUE and LLE coordination and strength with impaired midline orientation (leans L)  Mobility Bed Mobility Bed Mobility: Sit to Supine;Supine to Sit Rolling Right: Independent Rolling Left: Independent Supine to Sit: Independent Sit to Supine: Independent Transfers Transfers: Sit to Stand;Stand to Sit;Stand Pivot Transfers Sit to Stand: Independent with assistive device Stand to Sit: Independent with assistive device Stand Pivot Transfers: Supervision/Verbal cueing Transfer (Assistive device): Rollator Locomotion  Gait Ambulation: Yes Gait Assistance: Supervision/Verbal cueing Gait Distance (Feet): 1000 Feet Assistive device: Rollator Gait Gait: Yes Gait Pattern: Impaired Gait Pattern: Lateral trunk lean to left;Step-through pattern Stairs / Additional Locomotion Stairs: Yes Stairs Assistance: Supervision/Verbal cueing Stair Management Technique: Two rails Number of Stairs: 12 Height of Stairs: 7 Ramp: Supervision/Verbal cueing  (rollator) Curb: Supervision/Verbal cueing (with rollator) Wheelchair Mobility Wheelchair Mobility: No  Trunk/Postural Assessment  Cervical Assessment Cervical Assessment: Exceptions to [facility] (increased head tilt to the right at times, with cervical pain on the left side.) Thoracic Assessment Thoracic Assessment: Exceptions to [facility] (thoracic rounding with shoulder protraction bilaterally) Lumbar Assessment Lumbar Assessment: Within Functional Limits Postural Control Postural Control: Within Functional Limits  Balance  Balance Balance Assessed: Yes Standardized Balance Assessment Standardized Balance Assessment: Berg Balance Test;Functional Gait Assessment Berg Balance Test Sit to Stand: Able to stand without using hands and stabilize independently Standing Unsupported: Able to stand safely 2 minutes Sitting with Back Unsupported but Feet Supported on Floor or Stool: Able to sit safely and securely 2 minutes Stand to Sit: Sits safely with minimal use of hands Transfers: Able to transfer safely, minor use of hands Standing Unsupported with Eyes Closed: Able to stand 10 seconds safely Standing Ubsupported with Feet Together: Able to place feet together independently but unable to hold for 30 seconds (L LOB) From Standing, Reach Forward with Outstretched Arm: Can reach confidently >25 cm (10") From Standing Position, Pick up Object from Floor: Able to pick up shoe, needs supervision From Standing Position, Turn to Look Behind Over each Shoulder: Needs supervision when turning (minor sway turning towards L but able to use ankle strategy to maintain balance) Turn 360 Degrees: Able to turn 360 degrees safely in 4 seconds or less Standing Unsupported, Alternately Place Feet on Step/Stool: Able to complete 4 steps without aid or supervision Standing Unsupported, One Foot in Front: Able to plae foot ahead of the other independently and hold 30 seconds Standing on One Leg: Tries to lift leg/unable  to hold 3 seconds but remains standing independently Total Score: 44 Static Sitting Balance Static Sitting - Balance Support: Feet supported Static Sitting - Level of Assistance: 7: Independent Dynamic Sitting Balance Dynamic Sitting - Balance Support: Feet unsupported Dynamic Sitting - Level of Assistance: 7: Independent Static Standing Balance Static Standing - Balance Support: Bilateral upper extremity supported Static Standing - Level of Assistance: 6: Modified independent (Device/Increase time) Dynamic Standing Balance Dynamic  Standing - Balance Support: During functional activity;Bilateral upper extremity supported Dynamic Standing - Level of Assistance: 5: Stand by assistance Functional Gait  Assessment Gait assessed : Yes (not using AD) Gait Level Surface: Walks 20 ft in less than 7 sec but greater than 5.5 sec, uses assistive device, slower speed, mild gait deviations, or deviates 6-10 in outside of the 12 in walkway width. Change in Gait Speed: Able to change speed, demonstrates mild gait deviations, deviates 6-10 in outside of the 12 in walkway width, or no gait deviations, unable to achieve a major change in velocity, or uses a change in velocity, or uses an assistive device. Gait with Horizontal Head Turns: Performs head turns smoothly with slight change in gait velocity (eg, minor disruption to smooth gait path), deviates 6-10 in outside 12 in walkway width, or uses an assistive device. Gait with Vertical Head Turns: Performs task with slight change in gait velocity (eg, minor disruption to smooth gait path), deviates 6 - 10 in outside 12 in walkway width or uses assistive device Gait and Pivot Turn: Pivot turns safely in greater than 3 sec and stops with no loss of balance, or pivot turns safely within 3 sec and stops with mild imbalance, requires small steps to catch balance. Step Over Obstacle: Is able to step over one shoe box (4.5 in total height) without changing gait speed.  No evidence of imbalance. Gait with Narrow Base of Support: Ambulates less than 4 steps heel to toe or cannot perform without assistance. Gait with Eyes Closed: Walks 20 ft, slow speed, abnormal gait pattern, evidence for imbalance, deviates 10-15 in outside 12 in walkway width. Requires more than 9 sec to ambulate 20 ft. Ambulating Backwards: Walks 20 ft, uses assistive device, slower speed, mild gait deviations, deviates 6-10 in outside 12 in walkway width. Steps: Alternating feet, must use rail. Total Score: 17 Extremity Assessment      RLE Assessment RLE Assessment: Within Functional Limits LLE Assessment LLE Assessment: Within Functional Limits General Strength Comments: Grossly 4+/5 to 5/5    [name] , PT, DPT, NCS, CSRS 09/25/2020, 12:49 PM

## 2020-09-25 NOTE — Progress Notes (Signed)
Recreational Therapy Session Note  Patient Details  Name: ZYMIR NAPOLI MRN: [id number] Date of Birth: 1985-04-22 Today's Date: 09/25/2020  Pain: no c/o Skilled Therapeutic Interventions/Progress Updates: Time: Pain:  Community reintegration/outing to [employer] at [employer] guard assist level using rollator.  Goals focused on safe community mobility including curb steps, identification & negotiation of obstacles, accessing public restroom, energy conservation techniques/education, LUE use and compensatory strategy use with dizziness.  All goals met.  Pt ambulated throughout the entire outing with rollator, no need for seated rest breaks.  See outing goal sheet in shadow chart for full details.   Therapy/Group: [employer]   De [place]orski 09/25/2020, 3:25 PM

## 2020-09-25 NOTE — Progress Notes (Signed)
Occupational Therapy Discharge Summary  Patient Details  Name: Curtis [name] MRN: [id number] Date of Birth: 02/05/1985  Today's Date: 09/25/2020 OT Individual Time: 2119-4174 OT Individual Time Calculation (min): 88 min   Session Note:  Pt participated in community outing consisting of functional mobility with use of the rollator at supervision level while walking in the grocery store.  He was able to use external signs to help locate the correct isles for obtaining items on the grocery list.  LUE was integrated for reaching items from different heights with education to avoid grasping breakable items with the LUE currently.  He wore his eye patch over the right eye throughout session with dizziness sustained at 2/10 throughout session with no increase reported with mobility, reaching to different heights, or during [place] ride to and from the store.  He was able to ambulate up and down the van steps without difficulty at supervision level using BUEs for support.  No rest breaks needed throughout ambulation in the store of over 30 mins.  Discussed initial use of a scooter if needed when he is out with his wife and baby at the store as she will not be able to provide supervision to him.  He voices understanding.  Returned back to the room at the end of the session with use of the rollator for support.  He was left in bed resting with call button and phone in reach.    Patient has met 6 of 8 long term goals due to improved activity tolerance, improved balance, postural control, ability to compensate for deficits, and functional use of  LEFT upper and LEFT lower extremity.  Patient to discharge at overall Supervision level.  Patient's care partner is independent to provide the necessary physical assistance at discharge.    Reasons goals not met: He continues to need supervision for transfers and dynamic standing balance secondary to balance deficits and weakness.  Recommendation:  Patient will benefit  from ongoing skilled OT services in outpatient setting to continue to advance functional skills in the area of iADL, Vocation, and Reduce care partner burden.  Pt continues with decreased dynamic standing balance as well as LUE ataxia and proximal weakness.  Feel he will benefit from outpatient OT to continue working on these deficits as well as vestibular and oculomotor deficits as this impacts his overall ADL and IADL function.    Equipment: No equipment provided  Reasons for discharge: treatment goals met and discharge from hospital  Patient/family agrees with progress made and goals achieved: Yes  OT Discharge Precautions/Restrictions  Precautions Precautions: Fall Precaution Comments: [place] Syndrome. Nausea/dizziness, diplopia, rotating eye patch Restrictions Weight Bearing Restrictions: No   Pain Pain Assessment Pain Scale: Faces Pain Score: 0-No pain ADL ADL Eating: Independent Where Assessed-Eating: Chair Grooming: Independent Where Assessed-Grooming: Chair Upper Body Bathing: Modified independent Where Assessed-Upper Body Bathing: Chair, Shower Lower Body Bathing: Supervision/safety Where Assessed-Lower Body Bathing: Chair, Shower Upper Body Dressing: Independent Where Assessed-Upper Body Dressing: Chair Lower Body Dressing: Modified independent Where Assessed-Lower Body Dressing: Chair Toileting: Supervision/safety Where Assessed-Toileting: Glass blower/designer: Close supervision Toilet Transfer Method: Arts development officer: Energy manager: Not assessed Social research officer, government: Close supervision Social research officer, government Method: Heritage manager: Civil engineer, contracting with back Vision Baseline Vision/History: 1 Wears glasses Wears Glasses: At all times Patient Visual Report: Diplopia;Blurring of vision Vision Assessment?: Yes Eye Alignment: Within Functional Limits Ocular Range of Motion: Within Functional  Limits Alignment/Gaze Preference: Within Defined Limits Tracking/Visual Pursuits:  Able to track stimulus in all quads without difficulty;Other (comment) (vertical nystagmus) Saccades: Within functional limits Convergence: Within functional limits Visual Fields: No apparent deficits Diplopia Assessment: Objects split on top of one another Additional Comments: Pt with diplopia resolving with intermittent single vision noted right of midline at near vision and some left of midline as well but not as clear.  Still with intermittent resting nystamus at times but not always present. Perception  Perception: Within Functional Limits Praxis Praxis: Intact Cognition Overall Cognitive Status: Within Functional Limits for tasks assessed Arousal/Alertness: Awake/alert Orientation Level: Oriented X4 Year: 2022 Month: August Day of Week: Correct Attention: Focused;Sustained;Selective Focused Attention: Appears intact Sustained Attention: Appears intact Selective Attention: Appears intact Memory: Appears intact Immediate Memory Recall: Sock;Blue;Bed Memory Recall Sock: Without Cue Memory Recall Blue: Without Cue Memory Recall Bed: Without Cue Problem Solving: Appears intact Safety/Judgment: Appears intact Sensation Sensation Light Touch: Appears Intact Hot/Cold: Appears Intact Proprioception: Appears Intact Stereognosis: Appears Intact Additional Comments: LUE sensation intact throughout Coordination Gross Motor Movements are Fluid and Coordinated: No Fine Motor Movements are Fluid and Coordinated: No Coordination and Movement Description: Slight left side ataxia noted with functional reaching tasks.  Still uses at a non-dominant level for selfcare tasks. 9 Hole Peg Test: 21 secs on right, 29 seconds on the left Motor  Motor Motor: Ataxia;Abnormal postural alignment and control Motor - Discharge Observations: Still with decrerased LUE and LLE coordination and strength Mobility  Bed  Mobility Bed Mobility: Rolling Right;Rolling Left;Supine to Sit;Sit to Supine Rolling Right: Independent Rolling Left: Independent Supine to Sit: Independent Sit to Supine: Independent Transfers Sit to Stand: Independent with assistive device Stand to Sit: Independent with assistive device  Trunk/Postural Assessment  Cervical Assessment Cervical Assessment: Exceptions to [facility] (increased head tilt to the right at times, with cervical pain on the left side.) Thoracic Assessment Thoracic Assessment: Exceptions to [facility] (thoracic rounding) Lumbar Assessment Lumbar Assessment: Within Functional Limits Postural Control Postural Control: Within Functional Limits  Balance Balance Balance Assessed: Yes Static Sitting Balance Static Sitting - Balance Support: Feet supported Static Sitting - Level of Assistance: 7: Independent Dynamic Sitting Balance Dynamic Sitting - Balance Support: Feet unsupported Dynamic Sitting - Level of Assistance: 7: Independent Static Standing Balance Static Standing - Balance Support: No upper extremity supported Static Standing - Level of Assistance: 6: Modified independent (Device/Increase time) Dynamic Standing Balance Dynamic Standing - Balance Support: During functional activity Dynamic Standing - Level of Assistance: 5: Stand by assistance Extremity/Trunk Assessment RUE Assessment RUE Assessment: Within Functional Limits Active Range of Motion (AROM) Comments: WNL General Strength Comments: 4+/5 grossly LUE Assessment LUE Assessment: Exceptions to [facility] Active Range of Motion (AROM) Comments: WNL General Strength Comments: shoulder flcxion 3+/5, all other joints 4/5 throughout.  Ataxia noted with finger to nose resulting in slight decreased coordination with completion of tasks when not stabilizing on a surface.   Romuald Mccaslin OTR/L 09/25/2020, 4:19 PM

## 2020-09-26 NOTE — Progress Notes (Signed)
Recreational Therapy Discharge Summary Patient Details  Name: TOMOYA RINGWALD MRN: [id number] Date of Birth: 11/14/1985 Today's Date: 09/26/2020  Long term goals set: 1  Long term goals met: 1  Comments on progress toward goals: Pt has made excellent progress during LOS and is ready for discharge home with wife today at supervision ambulatory level.  TR sessions focused on pt education including activity analysis/modifications & community reintegration.  Pt participated in a community outing to the grocery store with supervision/contact guard assist level using a rollator.  Goals focused on safe mobility, use of compensatory strategies and energy conservation techniques.  Goal met.  Reasons goals not met: n/a  Equipment acquired: n/a  Reasons for discharge: discharge from hospital Patient/family agrees with progress made and goals achieved: Yes  Talyssa Gibas 09/26/2020, 8:37 AM

## 2020-09-26 NOTE — Progress Notes (Signed)
PROGRESS NOTE   Subjective/Complaints:  Pt reports pain is more in Splenius capitus-  on L- has moved- but MUCH better- "a world of difference" from when came in- esp from last set of trigger point injections.  Wants to leave at 9am, so cannot have time for injections again this AM.  Have called Dr Antoine Primas to see if there's anything he can help with - haven't heard back- have texted him on cell phone now  ROS:   Pt denies SOB, abd pain, CP, N/V/C/D, and vision changes   Objective:   No results found. No results for input(s): WBC, HGB, HCT, PLT in the last 72 hours.  No results for input(s): NA, K, CL, CO2, GLUCOSE, BUN, CREATININE, CALCIUM in the last 72 hours.   Intake/Output Summary (Last 24 hours) at 09/26/2020 0901 Last data filed at 09/26/2020 0820 Gross per 24 hour  Intake 480 ml  Output --  Net 480 ml         Physical Exam: Vital Signs Blood pressure 119/76, pulse (!) 45, temperature 97.7 F (36.5 C), temperature source Oral, resp. rate 18, height 6' (1.829 m), weight (!) 139.2 kg, SpO2 96 %.    General: awake, alert, appropriate, laying comfortably in  NAD HENT: eyepatch on L oropharynx moist CV: regular rate; no JVD Pulmonary: CTA B/L; no W/R/R- good air movement GI: soft, NT, ND, (+)BS Psychiatric: appropriate Neurological: Ox3  Musc: No edema in extremities.  No tenderness in extremities. Neuro: Alert and oriented Cerebellar exam mild dysmetria left finger-nose-finger L neck SCM, upper traps, levators and L scalenes are better- now more in splenius capitus.   Assessment/Plan: 1. Functional deficits which require 3+ hours per day of interdisciplinary therapy in a comprehensive inpatient rehab setting. Physiatrist is providing close team supervision and 24 hour management of active medical problems listed below. Physiatrist and rehab team continue to assess barriers to discharge/monitor patient  progress toward functional and medical goals  Care Tool:  Bathing    Body parts bathed by patient: Right arm, Left arm, Chest, Abdomen, Front perineal area, Buttocks, Right upper leg, Left upper leg, Face, Left lower leg, Right lower leg         Bathing assist Assist Level: Supervision/Verbal cueing     Upper Body Dressing/Undressing Upper body dressing   What is the patient wearing?: Pull over shirt    Upper body assist Assist Level: Independent    Lower Body Dressing/Undressing Lower body dressing      What is the patient wearing?: Pants, Underwear/pull up     Lower body assist Assist for lower body dressing: Independent with assitive device     Toileting Toileting Toileting Activity did not occur (Clothing management and hygiene only): N/A (no void or bm)  Toileting assist Assist for toileting: Supervision/Verbal cueing     Transfers Chair/bed transfer  Transfers assist     Chair/bed transfer assist level: Supervision/Verbal cueing Chair/bed transfer assistive device: Other (rollator)   Locomotion Ambulation   Ambulation assist      Assist level: Supervision/Verbal cueing Assistive device: Rollator Max distance: 1,052ft   Walk 10 feet activity   Assist     Assist level:  Supervision/Verbal cueing Assistive device: Rollator   Walk 50 feet activity   Assist    Assist level: Supervision/Verbal cueing Assistive device: Rollator    Walk 150 feet activity   Assist    Assist level: Supervision/Verbal cueing Assistive device: Rollator    Walk 10 feet on uneven surface  activity   Assist Walk 10 feet on uneven surfaces activity did not occur: Safety/medical concerns   Assist level: Supervision/Verbal cueing Assistive device: Rollator   Wheelchair     Assist Is the patient using a wheelchair?: No   Wheelchair activity did not occur: N/A         Wheelchair 50 feet with 2 turns activity    Assist    Wheelchair 50  feet with 2 turns activity did not occur: N/A       Wheelchair 150 feet activity     Assist  Wheelchair 150 feet activity did not occur: N/A       Blood pressure 119/76, pulse (!) 45, temperature 97.7 F (36.5 C), temperature source Oral, resp. rate 18, height 6' (1.829 m), weight (!) 139.2 kg, SpO2 96 %.  Medical Problem List and Plan: 1.  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  Antithrombotics: -DVT/anticoagulation: SCDs               -antiplatelet therapy: Aspirin 81 mg daily and Plavix 75 mg daily x3 months then repeat vascular imaging to determine need to continue DAPT 3. Pain Management: Neurontin 300 mg 3 times daily, Tylenol 3 as needed             -tylenol has generally been effective for intermittent headaches  Robaxin 750 mg q8 hours prn- and lidoderm patch on L side of neck- 8pm to 8am  Trigger point injections performed on 8/30  Relatively controlled on 8/21  9/1- pt doing better- but will send to Dr Antoine Primas to see if can help- therapy thinks his L first rib is out.  4. Mood: Provide emotional support             -antipsychotic agents: N/A   - mood is stable, but would likely benefit from stroke support group after d/c.  5. Neuropsych: This patient is capable of making decisions on his own behalf. 6. Skin/Wound Care: Routine skin checks 7. Fluids/Electrolytes/Nutrition: Routine in and outs with follow-up chemistries on admit 8.  PFO noted on transthoracic echo, right to left shunt noted.  Unlikely cause of current stroke no further w/u per neuro, may f/u with cardiology as OP  9.  CN 4 palsy left , expect this to improve as it is outside of the main infarct location   8/29- wearing patch- alternating- con't regimen 10. Morbid Obesity  8/25- BMI is 41.62- counseling and  diet/exercise given  Continue to encourage 11. Slow transit constipation  Improving   I spent a total of 32 minutes on total care- >50% on coordination of care- calling Dr Antoine Primas and speaking with pt about f/u and what to expect.    LOS: 13 days A FACE TO FACE EVALUATION WAS PERFORMED  Curtis [name] 09/26/2020, 9:01 AM

## 2020-09-26 NOTE — Progress Notes (Signed)
Patient discharged home with wife. No questions or concerns at this time

## 2020-09-30 NOTE — Progress Notes (Signed)
[name] [address] [place] [address] Phone: [phone] Subjective:   [name], am serving as a scribe for Dr. Antoine Primas. This visit occurred during the SARS-CoV-2 public health emergency.  Safety protocols were in place, including screening questions prior to the visit, additional usage of staff PPE, and extensive cleaning of exam room while observing appropriate contact time as indicated for disinfecting solutions.   I'm seeing this patient by the request  of:  lovorn DO   CC: Neck and upper back pain  DJM:[id number]  Curtis [name] is a 35 y.o. male coming in with complaint of left neck pain is sharp and mostly consistent. The muscle relaxer's aren't working very well. Sometimes uses a travel pillow to stabilize neck, but that only helps momentarily.  Patient states even regular activities and sleeping becoming very difficult.  Patient states that because he is having some mild increasing pain again he is concerned to potentially have another stroke or dissection.  Patient is looking for some relief.  Patient is accompanied with wife as well.  MRI Cervical Spine results: No abnormal cord signal.  No canal or foraminal stenosis.     HPI from admission in 8/19 AEDON Curtis [name] is a 35 y.o. right-handed male  Independent prior to admission.  Works full-time from home.  Presented to [facility] 09/09/2020 left-sided numbness headache dizziness as well as nausea x1 week.  Per report recently presented to chiropractor 2 weeks ago for regular back adjustments.  Denied having any symptoms at that time however he had some persistent on and off neck pain x3 weeks.  MRI of the brain showed a small acute stroke of the lateral left medulla.  CTA head neck irregularity of the V2 segment of the left vertebral artery with sites of up to mild to moderate stenosis.  Irregularity of the V4 left vertebral artery with sites of up to moderate/severe stenosis.  MRI  of the brain follow-up more conspicuous signal abnormality in the left medulla that could possibly evolutionary change of previous stroke.  CT angiogram head and neck repeated 09/11/2020 showing persistent findings of left vertebral artery dissection extracranially and intracranially.  Slightly increased narrowing at the level of the C2 transverse foramen.  Neurology follow-up maintained on aspirin and Plavix x3 months.  Patient would require repeat vascular imaging in 3 months to determine need to continue DAPT.  Therapy evaluations completed due to patient decreased functional mobility was admitted for a comprehensive rehab program.  Patient stayed in rehabilitation for some time.  Patient recently was discharged from inpatient rehab with patient continuing to have some mild ataxia as well as nystagmus with left lateral gaze and mild ataxia left greater than right with DTRs of 1+.  Patient does have referrals and follow-ups with neurology and PMNR.    No past medical history on file. No past surgical history on file. Social History   Socioeconomic History   Marital status: Married    Spouse name: Not on file   Number of children: Not on file   Years of education: Not on file   Highest education level: Not on file  Occupational History   Not on file  Tobacco Use   Smoking status: Some Days    Types: Cigars   Smokeless tobacco: Never  Substance and Sexual Activity   Alcohol use: Yes    Alcohol/week: 2.0 standard drinks    Types: 2 Shots of liquor per week   Drug use:  Never   Sexual activity: Not on file  Other Topics Concern   Not on file  Social History Narrative   Not on file   Social Determinants of Health   Financial Resource Strain: Not on file  Food Insecurity: Not on file  Transportation Needs: Not on file  Physical Activity: Not on file  Stress: Not on file  Social Connections: Not on file   No Known Allergies No family history on file.   Current Outpatient  Medications (Cardiovascular):    atorvastatin (LIPITOR) 40 MG tablet, Take 1 tablet (40 mg total) by mouth daily.   Current Outpatient Medications (Analgesics):    acetaminophen-codeine (TYLENOL #3) 300-30 MG tablet, Take 1 tablet by mouth every 6 (six) hours as needed for moderate pain.   aspirin EC 81 MG EC tablet, Take 1 tablet (81 mg total) by mouth daily. Swallow whole.  Current Outpatient Medications (Hematological):    clopidogrel (PLAVIX) 75 MG tablet, Take 1 tablet (75 mg total) by mouth daily.  Current Outpatient Medications (Other):    gabapentin (NEURONTIN) 300 MG capsule, Take 1 capsule (300 mg total) by mouth 3 (three) times daily.   lidocaine (LIDODERM) 5 %, Place 1 patch onto the skin daily. Remove & Discard patch within 12 hours or as directed by MD   meclizine (ANTIVERT) 50 MG tablet, Take 1 tablet (50 mg total) by mouth 3 (three) times daily.   methocarbamol (ROBAXIN) 750 MG tablet, Take 1 tablet (750 mg total) by mouth every 8 (eight) hours as needed for muscle spasms.   Reviewed prior external information including notes and imaging from  primary care provider As well as notes that were available from care everywhere and other healthcare systems.  Past medical history, social, surgical and family history all reviewed in electronic medical record.  No pertanent information unless stated regarding to the chief complaint.   Review of Systems:  No  visual changes, nausea, vomiting, diarrhea, constipation, dizziness, abdominal pain, skin rash, fevers, chills, night sweats, weight loss, swollen lymph nodes, I joint swelling, chest pain, shortness of breath, mood changes. POSITIVE muscle aches, body aches, mild headache, difficulty with balance  Objective  Blood pressure (!) 132/92, pulse 93, height 6' (1.829 m), weight 290 lb (131.5 kg), SpO2 98 %.   General: No apparent distress alert and oriented x3 mood and affect normal, dressed appropriately.  HEENT: Pupils equal,  extraocular movements intact  Respiratory: Patient's speak in full sentences and does not appear short of breath  Cardiovascular: No lower extremity edema, non tender, no erythema  Gait patient does have ataxia.  Is using a rolling walker for help. MSK: Neck exam does have tightness noted more on the left side of the neck.  We avoided full extension of the neck.  Patient did have decent flexion.  Significant tightness still noted from the C2 through the T5 on the left side.  Patient does have some mild scapular dyskinesis noted and does have weakness of the shoulder girdle minorly compared to the contralateral side.  Patient does have constant numbness of the upper extremities.  4-5 strength of the upper extremity noted.    Impression and Recommendations:     The above documentation has been reviewed and is accurate and complete [name], DO

## 2020-10-01 ENCOUNTER — Telehealth: Payer: Self-pay

## 2020-10-01 ENCOUNTER — Ambulatory Visit (INDEPENDENT_AMBULATORY_CARE_PROVIDER_SITE_OTHER): Payer: BC Managed Care – PPO | Admitting: Family Medicine

## 2020-10-01 ENCOUNTER — Other Ambulatory Visit: Payer: Self-pay

## 2020-10-01 VITALS — BP 132/92 | HR 93 | Ht 72.0 in | Wt 290.0 lb

## 2020-10-01 DIAGNOSIS — I7774 Dissection of vertebral artery: Secondary | ICD-10-CM | POA: Diagnosis not present

## 2020-10-01 DIAGNOSIS — M542 Cervicalgia: Secondary | ICD-10-CM | POA: Diagnosis not present

## 2020-10-01 NOTE — Telephone Encounter (Signed)
Transition Care Management Unsuccessful Follow-up Telephone Call  Date of discharge and from where:  09/26/20 from Inpatient Rehab  Attempts:  1st Attempt  Reason for unsuccessful TCM follow-up call:  Left voice message   Kathyrn Sheriff, RN, MSN, BSN, CCM [facility] Care Management Coordinator [phone]

## 2020-10-01 NOTE — Patient Instructions (Signed)
Salonpas to shoulder region Ice 20 min 2x a day Can use tennis ball to shoulder blade not near the neck Exercises at least 3 times a week See you again in 4 to 6 weeks

## 2020-10-01 NOTE — Assessment & Plan Note (Signed)
Patient continues to have more of a neck pain.  Did have the acute stroke noted of the medulla as well as having the vertebral artery dissection.  Discussed with him that at this point time we will not do any type of osteopathic manipulation on the neck but we could consider the possibility of the thoracic area.  Patient at this point though as well as his wife would rather him do some other potential treatment.  Encouraged home exercises and given handout.  Patient is starting with formal physical therapy and I think will be doing relatively well.  Does have gabapentin and we discussed potentially increasing if necessary.  Also does have Robaxin if necessary.  When patient comes back can consider the possibility of trigger point injections or formal osteopathic manipulation.  Follow-up with me again in 4 to 6 weeks.

## 2020-10-02 ENCOUNTER — Ambulatory Visit: Payer: BC Managed Care – PPO | Attending: Physician Assistant | Admitting: Occupational Therapy

## 2020-10-02 ENCOUNTER — Ambulatory Visit: Payer: BC Managed Care – PPO | Admitting: Physical Therapy

## 2020-10-02 ENCOUNTER — Telehealth: Payer: Self-pay

## 2020-10-02 ENCOUNTER — Encounter: Payer: Self-pay | Admitting: Occupational Therapy

## 2020-10-02 ENCOUNTER — Encounter: Payer: Self-pay | Admitting: Family Medicine

## 2020-10-02 ENCOUNTER — Encounter: Payer: Self-pay | Admitting: Physical Therapy

## 2020-10-02 DIAGNOSIS — R278 Other lack of coordination: Secondary | ICD-10-CM | POA: Diagnosis not present

## 2020-10-02 DIAGNOSIS — R2681 Unsteadiness on feet: Secondary | ICD-10-CM | POA: Insufficient documentation

## 2020-10-02 DIAGNOSIS — I7774 Dissection of vertebral artery: Secondary | ICD-10-CM | POA: Diagnosis not present

## 2020-10-02 DIAGNOSIS — H543 Unqualified visual loss, both eyes: Secondary | ICD-10-CM | POA: Diagnosis not present

## 2020-10-02 DIAGNOSIS — R2689 Other abnormalities of gait and mobility: Secondary | ICD-10-CM | POA: Diagnosis not present

## 2020-10-02 DIAGNOSIS — M6281 Muscle weakness (generalized): Secondary | ICD-10-CM | POA: Diagnosis not present

## 2020-10-02 NOTE — Therapy (Signed)
[facility] MAIN [facility] SERVICES [address] [place], [place], [address] Phone: [phone]   Fax:  [phone]  Occupational Therapy Evaluation  Patient Details  Name: Curtis [name] MRN: [id number] Date of Birth: 06/09/1985 Referring Provider (OT): [name]   Encounter Date: 10/02/2020   OT End of Session - 10/02/20 1623     Visit Number 1    Number of Visits 24    Date for OT Re-Evaluation 12/25/20    Authorization Time Period Progress report period starting 10/02/2020    OT Start Time 1505    OT Stop Time 1600    OT Time Calculation (min) 55 min    Activity Tolerance Patient tolerated treatment well    Behavior During Therapy [facility] for tasks assessed/performed             History reviewed. No pertinent past medical history.  History reviewed. No pertinent surgical history.  There were no vitals filed for this visit.   Subjective Assessment - 10/02/20 1615     Subjective  Pt. was present with his wife, and 43 month old daughter.    Patient is accompanied by: Family member    Pertinent History Pt. is a 35 y.o. male who was diagnosed with Vertebral Artery Dissection, and Small Acute CVA. Pt. has Dizziness, nausea, diplopia, and fatigue. Pt.'s 1st child was born 3 months ago. Pt. 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    Patient Stated Goals To be able to hold, and feed his daughter, and to be able to play golf again.    Currently in Pain? Yes    Pain Score 7     Pain Location Neck    Pain Orientation Left    Pain Descriptors / Indicators Sharp;Throbbing    Pain Type Chronic pain    Pain Onset 1 to 4 weeks ago    Pain Frequency Intermittent               [facility] OT Assessment - 10/02/20 1615       Assessment   Medical Diagnosis CVA    Referring Provider (OT) [name]    Hand Dominance Right      Precautions   Precautions None      Restrictions   Weight Bearing Restrictions No      Balance Screen   Has  the patient fallen in the past 6 months Yes    How many times? 1    Has the patient had a decrease in activity level because of a fear of falling?  No    Is the patient reluctant to leave their home because of a fear of falling?  No      Home  Environment   Family/patient expects to be discharged to: Private residence    Living Arrangements Spouse/significant other    Available Help at Discharge Family    Home Access Stairs    Home Layout One level    Alternate Level Stairs - Number of Steps 3    Bathroom Copywriter, advertising;Door    Home Equipment Walker - 4 wheels;Walker - 2 wheels;Shower seat - built in;Hand held Public house manager    Lives With Spouse      Prior Function   Level of Independence Independent    Vocation Full time employment    Technical brewer    Leisure Golf      ADL   Eating/Feeding Independent    Grooming Independent    Upper  Body Bathing Independent    Lower Body Bathing Independent    Upper Body Dressing Independent    Lower Body Dressing Independent    Toilet Transfer Independent    Toileting - Solicitor -  Database administrator Independent      IADL   Prior Level of Function Shopping Independent    Shopping Needs to be accompanied on any shopping trip    Prior Level of Function Light Housekeeping Independent    Light Housekeeping Needs help with all home maintenance tasks    Prior Level of Function Meal Prep Independent    Meal Prep Able to complete simple cold meal and snack prep    Prior Level of Function Best boy Relies on family or friends for transportation    Prior Level of Function Medication Managment Independent    Medication Management Is responsible for taking medication in correct dosages at correct time   Uses a pillbox   Prior Level of Function Financial Management Independent    Financial Management --   Wife always  performs     Written Expression   Handwriting 90% legible      Vision - History   Patient Visual Report --   Double vision, superior/inferior, with pockets of bluriness     Vision Assessment   Diplopia Assessment Objects split on top of one another      Activity Tolerance   Activity Tolerance Tolerates 10-20 min activity with multiple rests      Cognition   Overall Cognitive Status Within Functional Limits for tasks assessed      Observation/Other Assessments   Focus on Therapeutic Outcomes (FOTO)  63      Sensation   Light Touch Appears Intact    Hot/Cold Appears Intact   diminished at the tips volar tips of the left digits.     Coordination   Gross Motor Movements are Fluid and Coordinated Yes    Fine Motor Movements are Fluid and Coordinated No    Right 9 Hole Peg Test 22 sec.    Left 9 Hole Peg Test 28 sec.      Strength   Overall Strength Comments LUE strength 4+/5, RUE strength 5/5      Hand Function   Right Hand Grip (lbs) 90    Right Hand Lateral Pinch 25 lbs    Right Hand 3 Point Pinch 28 lbs    Left Hand Grip (lbs) 75    Left Hand Lateral Pinch 26 lbs    Left 3 point pinch 24 lbs                             OT Education - 10/02/20 1623     Education Details OT services POC,and goals    Person(s) Educated Patient    Methods Explanation    Comprehension Verbalized understanding              OT Short Term Goals - 10/02/20 1650       OT SHORT TERM GOAL #1   Title Pt. will improve FOTO score by 3 points for clinically relevant change during ADLs, and IADL tasks.    Baseline Eval: FOTO score 63 TR score 75    Time 12    Period Weeks    Status New    Target Date 11/13/20  OT Long Term Goals - 10/02/20 1652       OT LONG TERM GOAL #1   Title Pt. will improve UE strength in order to be able to hold, and feed his baby with modified independence.    Baseline Eval: Pt. is unable to hold, and feed his baby.     Time 12    Period Weeks    Status New    Target Date 12/25/20      OT LONG TERM GOAL #2   Title Pt. will perform light homemaking tasks with modified indepedence in standing    Baseline EVal: Limited standing tolerance during home management tasks.    Time 12    Period Weeks    Status New    Target Date 12/25/20      OT LONG TERM GOAL #3   Title Pt. will independently demonstrate visual compensatory strategies 100% of the time during tabletop ADLs, and IADL tasks.    Baseline Eval: Pt. has difficulty    Time 12    Period Weeks    Status New    Target Date 12/25/20      OT LONG TERM GOAL #4   Title Pt. will demonstrate visual compensatory strategies 100% of the time while performing IADL tasks within his extrpersonal space.    Baseline Eval: Pt. has difficulty    Time 12    Period Weeks    Status New    Target Date 12/25/20                   Plan - 10/02/20 1624     Clinical Impression Statement Pt. is a 35 y.o. male who was diagnosed with a ertebral Artery Dissection, and small acute CVA of the Left Lateral Medulla. Pt. present with 7/10 pain in the base of the left side of his neck, 3/10 dizziness currently, and 7/10 dizziness in the am upon rising. Pt. presents with Diplopia, and is currently wearing a patch alternating between the left, and right eyes. Pt. presents with limited LUE strength, impaired left hand [facility] skills, and difficulty with activity tolerance in standing during ADL, and IADL tasks. Pt. will benefit from OT skilled services to work on improving LUE strength, and FMC skills, standing tolerance during ADL, and IADL tasks, and education about visual compensatory strategies during ADLs, and IADLs in order to work towards being able to hold, and feed his baby, stand to complete home management, and laundry tasks in order to improve, and maximize independence wtih ADLs, and IADL tasks.    OT Occupational Profile and History Detailed Assessment- Review of  Records and additional review of physical, cognitive, psychosocial history related to current functional performance    Occupational performance deficits (Please refer to evaluation for details): IADL's;ADL's    Body Structure / Function / Physical Skills ADL;IADL    Rehab Potential Good    Clinical Decision Making Several treatment options, min-mod task modification necessary    Comorbidities Affecting Occupational Performance: May have comorbidities impacting occupational performance    Modification or Assistance to Complete Evaluation  Min-Moderate modification of tasks or assist with assess necessary to complete eval    OT Frequency 3x / week    OT Duration 12 weeks    OT Treatment/Interventions Therapeutic exercise;DME and/or AE instruction;Patient/family education;Therapeutic activities;Self-care/ADL training    Consulted and Agree with Plan of Care Patient             Patient will benefit from skilled therapeutic intervention in  order to improve the following deficits and impairments:   Body Structure / Function / Physical Skills: ADL, IADL       Visit Diagnosis: Muscle weakness (generalized)  Other lack of coordination  Low vision, both eyes    Problem List Patient Active Problem List   Diagnosis Date Noted   Slow transit constipation    Morbid obesity (HCC)    Neck pain    ASD (atrial septal defect)    Ischemic cerebrovascular accident (CVA) of frontal lobe (HCC) 09/13/2020   Stroke (HCC) 09/10/2020   Lateral medullary syndrome    Vertebral artery dissection (HCC) 09/09/2020   Obesity (BMI 35.0-39.9 without comorbidity) 09/09/2020   Essential hypertension 09/09/2020   Acute stroke of medulla oblongata (HCC)     Olegario MessierElaine Xia Stohr, MS, OTR/L 10/02/2020, 5:04 PM  [facility] [facility]IN [facility] [address] [place], [place], [address] Phone: [phone]   Fax:  [phone]  Name: Curtis [name] MRN: [id number] Date  of Birth: [birth year]

## 2020-10-02 NOTE — Therapy (Addendum)
[facility] [address] [place], [place], [address] Phone: [phone]   Fax:  [phone]  Physical Therapy Evaluation  Patient Details  Name: Curtis NielsenJerry D [name] MRN: [id number] Date of Birth: 1985/08/11 Referring Provider (PT): Alfredo BattyAnguilli, Daniel PA-C   Encounter Date: 10/02/2020   PT End of Session - 10/02/20 1712     Visit Number 1    Number of Visits 24    Date for PT Re-Evaluation 12/25/20    Authorization Type [employer], no auth req    Progress Note Due on Visit 10    PT Start Time 1604    PT Stop Time 1700    PT Time Calculation (min) 56 min    Equipment Utilized During Treatment Gait belt    Activity Tolerance Patient tolerated treatment well    Behavior During Therapy [facility] for tasks assessed/performed             History reviewed. No pertinent past medical history.  History reviewed. No pertinent surgical history.  There were no vitals filed for this visit.    Subjective Assessment - 10/02/20 1606     Subjective Pt reports he pulls to the left with balance (the side he had stroke on). Reports difficulty with shifting weight. Pt has difficulty with finding things on his left side and has weakness in his shoulder. Pt reports he used to play a lot of golf prior to stroke and would like to get back to this. He also has 3 m.o. daughter who he would like to get back to being able to hold. Pt is only able to hold daighter in seated position with daughter. He also reports getting tired ver easily when doing chores like gettig dishes out of dish washer. 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 Need shis vision and his mental state to return to normal prior to being able to go back to work.    Patient is accompained by: Family member   [name]   Pertinent History Mr. Hinton DyerClifford is a 35 y.o. M with obesity no other significant PMHx presented to hospital  with acute onset nausea, dizziness.      Patient had recently seen a chiropractor and being treated with cervical manipulation.  On the morning of admission he developed sudden dizziness, nausea, neck pain, and headache and so he came to the ER.    CT angiogram was obtained that showed findings consistent with dissection of the left vertebral artery.  MRI brain showed small ischemic infarct in the left lateral medulla. Pt had stay at inpatient rehab and has made significant progress in his funciton since discharge. Pt also has 3 m.o. daughter he would like to be able to have a larger part in caring for but is restricted due to his impairments. at I.E. patient presented to therapy ambulating with rollator.    Limitations Lifting;Walking;Standing    How long can you sit comfortably? no issues with sitting    How long can you stand comfortably? 5-10 min    How long can you walk comfortably? 30 min (at grocery store)    Patient Stated Goals holding dighter, being able to return to golf    Currently in Pain? Yes    Pain Score 7     Pain Location Neck    Pain Orientation Left;Upper    Pain Descriptors / Indicators Sharp;Throbbing    Pain Type Chronic pain    Pain Onset 1 to 4  weeks ago    Pain Frequency Intermittent    Aggravating Factors  sleeping    Pain Relieving Factors sleeping on back                [facility] PT Assessment - 10/02/20 1619       Assessment   Medical Diagnosis CVA    Referring Provider (PT) Cruzita Lederer PA-C    Hand Dominance Right      Balance Screen   Has the patient fallen in the past 6 months Yes    How many times? 1    Has the patient had a decrease in activity level because of a fear of falling?  No    Is the patient reluctant to leave their home because of a fear of falling?  No      Home Environment   Living Environment Private residence    Type of Home House    Home Access Stairs to enter    Entrance Stairs-Number of Steps 3      Prior Function   Level of Independence Independent     Vocation Full time employment    Vocation Requirements Finance    Leisure Golf      Cognition   Overall Cognitive Status Within Functional Limits for tasks assessed      Sensation   Light Touch Appears Intact      Coordination   Heel Shin Test normal      ROM / Strength   AROM / PROM / Strength --   Strength WNL     6 minute walk test results    Aerobic Endurance Distance Walked 1015    Endurance additional comments with rollator      Standardized Balance Assessment   Standardized Balance Assessment Timed Up and Go Test;Five Times Sit to Stand;10 meter walk test;Berg Balance Test    Five times sit to stand comments  21.27      Berg Balance Test   Sit to Stand Able to stand without using hands and stabilize independently    Standing Unsupported Able to stand safely 2 minutes    Sitting with Back Unsupported but Feet Supported on Floor or Stool Able to sit safely and securely 2 minutes    Stand to Sit Sits safely with minimal use of hands    Transfers Able to transfer safely, minor use of hands    Standing Unsupported with Eyes Closed Able to stand 10 seconds safely    Standing Unsupported with Feet Together Able to place feet together independently and stand 1 minute safely    From Standing, Reach Forward with Outstretched Arm Can reach confidently >25 cm (10")    From Standing Position, Pick up Object from Floor Able to pick up shoe safely and easily    From Standing Position, Turn to Look Behind Over each Shoulder Looks behind from both sides and weight shifts well    Turn 360 Degrees Able to turn 360 degrees safely but slowly    Standing Unsupported, Alternately Place Feet on Step/Stool Able to stand independently and complete 8 steps >20 seconds    Standing Unsupported, One Foot in Front Able to place foot tandem independently and hold 30 seconds    Standing on One Leg Tries to lift leg/unable to hold 3 seconds but remains standing independently    Total Score 50       Timed Up and Go Test   Normal TUG (seconds) 14      Functional  Gait  Assessment   Gait Level Surface Walks 20 ft in less than 5.5 sec, no assistive devices, good speed, no evidence for imbalance, normal gait pattern, deviates no more than 6 in outside of the 12 in walkway width.    Change in Gait Speed Able to smoothly change walking speed without loss of balance or gait deviation. Deviate no more than 6 in outside of the 12 in walkway width.    Gait with Horizontal Head Turns Performs head turns smoothly with slight change in gait velocity (eg, minor disruption to smooth gait path), deviates 6-10 in outside 12 in walkway width, or uses an assistive device.    Gait with Vertical Head Turns Performs task with slight change in gait velocity (eg, minor disruption to smooth gait path), deviates 6 - 10 in outside 12 in walkway width or uses assistive device    Gait and Pivot Turn Pivot turns safely within 3 sec and stops quickly with no loss of balance.    Step Over Obstacle Is able to step over one shoe box (4.5 in total height) without changing gait speed. No evidence of imbalance.    Gait with Narrow Base of Support Ambulates 7-9 steps.    Gait with Eyes Closed Walks 20 ft, slow speed, abnormal gait pattern, evidence for imbalance, deviates 10-15 in outside 12 in walkway width. Requires more than 9 sec to ambulate 20 ft.    Ambulating Backwards Walks 20 ft, slow speed, abnormal gait pattern, evidence for imbalance, deviates 10-15 in outside 12 in walkway width.    Steps Two feet to a stair, must use rail.    Total Score 20            Pt noted with prolonged walking his involved LE began to feel heavier. No noticeable deviation from gait pattern with this heavy feeling. Pt also has visual deficits that could be addressed utilizing a mirror to better establish his equilibrium. He tends to lean toward his involved side with SLS or other static balance activities.             Objective  measurements completed on examination: See above findings.                PT Education - 10/02/20 1711     Education Details Pt educated regarding POC    Person(s) Educated Patient   Spouse had to leave to pick up car from valet prior to end of session where POC was reviewed   Methods Explanation    Comprehension Verbalized understanding              PT Short Term Goals - 10/02/20 1721       PT SHORT TERM GOAL #1   Title Patient will be independent in home exercise program to improve strength/mobility for better functional independence with ADLs.    Baseline Pt has HEP from hospital discharge but it is not progressive    Time 4    Period Weeks    Status New    Target Date 10/30/20      PT SHORT TERM GOAL #2   Title Patient (< 37 years old) will complete five times sit to stand test in < 15 seconds indicating an increased LE strength and improved balance.    Baseline >20 sec    Time 4    Period Weeks    Target Date 10/30/20      PT SHORT TERM GOAL #3   Title Patient will  increase six minute walk test distance to >1200 with LRAD for progression to community ambulator and improve gait ability    Baseline 1015 feet with rollator    Time 4    Period Weeks    Status New               PT Long Term Goals - 10/02/20 1724       PT LONG TERM GOAL #1   Title Patient will increase FOTO score to equal to or greater than 15    to demonstrate statistically significant improvement in mobility and quality of life.    Baseline 63.5    Time 12    Period Weeks    Status New    Target Date 12/25/20      PT LONG TERM GOAL #2   Title Patient will increase Functional Gait Assessment score to >25/30 as to reduce fall risk and improve dynamic gait safety with community ambulation.    Baseline 20    Time 12    Period Weeks    Status New    Target Date 12/25/20      PT LONG TERM GOAL #3   Title Patient will ascend/descend 4 stairs without rail assist independently  without loss of balance to improve ability to get in/out of home.    Baseline requires use of handrails to safely naigate stairs    Time 12    Period Weeks    Status New      PT LONG TERM GOAL #4   Title Patient will increase six minute walk test distance to >1300 without AD for progression to community ambulator and improve gait ability    Baseline 1015 with rollator    Time 12    Period Weeks    Target Date 12/25/20                    Plan - 10/02/20 1715     Clinical Impression Statement Pt is a 35 y.o. male who presents to therpy following L vertebral artery disection and ischemic infarct of left lateral medulla. Pt presents with deficits in endurance and balance as evidenced by dynamic gait index, and TUG as well as deficits in muscular power as indicated by 5 x STS test. In addition pt has significant difficulty with ambulating without an AD for prolonged distances. Pt has gait pattern consisting of slight knee locking in stance phase but it is unclear if this is a result of the stroke or wether this is his pre-morbid gait pattern. Without use of RW pt utilizes wider base of support and has mild imbalance when completing other tasks such as turning his head or walking backwards. 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 daughter. Pt also has participation restrictions in his ability to participate in recreational activities such as golfing. Pt will benefit from skilled physical therapy intervention in order to address the above goals and impairments evaluated in this exam as well as to improve his overall QOL.    Examination-Activity Limitations Caring for Others;Carry;Lift;Locomotion Level;Squat;Stairs;Stand    Examination-Participation Restrictions Driving;Occupation;Yard Work    Public house manager  Low    Rehab Potential Excellent    PT Frequency 2x / week    PT Duration 12 weeks    PT Treatment/Interventions ADLs/Self Care Home Management;Neuromuscular re-education;Patient/family  education;Manual techniques;Passive range of motion;Dry needling;Energy conservation;Vestibular;Visual/perceptual remediation/compensation;Therapeutic activities;Functional mobility training;Therapeutic exercise;Balance training;Gait training    PT Next Visit Plan MiniBest or other outcome measure, develop and provide HEP,  consider use of mirror to assist with  finding neutral    PT Home Exercise Plan To provide next session    Consulted and Agree with Plan of Care Patient             Patient will benefit from skilled therapeutic intervention in order to improve the following deficits and impairments:  Abnormal gait, Decreased activity tolerance, Decreased balance, Decreased coordination, Decreased endurance, Decreased mobility, Decreased range of motion, Decreased strength, Difficulty walking, Dizziness, Impaired perceived functional ability, Impaired vision/preception  Visit Diagnosis: Other abnormalities of gait and mobility - Plan: PT plan of care cert/re-cert  Unsteadiness on feet - Plan: PT plan of care cert/re-cert     Problem List Patient Active Problem List   Diagnosis Date Noted   Slow transit constipation    Morbid obesity (HCC)    Neck pain    ASD (atrial septal defect)    Ischemic cerebrovascular accident (CVA) of frontal lobe (HCC) 09/13/2020   Stroke (HCC) 09/10/2020   Lateral medullary syndrome    Vertebral artery dissection (HCC) 09/09/2020   Obesity (BMI 35.0-39.9 without comorbidity) 09/09/2020   Essential hypertension 09/09/2020   Acute stroke of medulla oblongata (HCC)     Norman Herrlich, PT 10/03/2020, 9:21 AM  [place] [facility] MAIN [facility] SERVICES [address] [place], [place], [address] Phone: [phone]   Fax:   [phone]  Name: TREK KIMBALL MRN: [id number] Date of Birth: 10-11-1985

## 2020-10-02 NOTE — Telephone Encounter (Signed)
Transition Care Management Follow-up Telephone Call Patient currently in outpatient rehab, who referred call to his wife Krishon Adkison.  Date of discharge and from where: 09/26/20 from Inpatient rehab How have you been since you were released from the hospital? "He is getting better" Any questions or concerns? No  Items Reviewed: Did the pt receive and understand the discharge instructions provided? Yes  Medications obtained and verified? Yes  Other?  N/a Any new allergies since your discharge? No  Dietary orders reviewed? yes Do you have support at home? Yes   Home Care and Equipment/Supplies: Were home health services ordered? No. He has Outpatient PT/OT at [facility] If so, what is the name of the agency? n/a  Has the agency set up a time to come to the patient's home? not applicable Were any new equipment or medical supplies ordered?  Yes: rollator walker and regular walker What is the name of the medical supply agency? n/a Were you able to get the supplies/equipment? yes Do you have any questions related to the use of the equipment or supplies? No  Functional Questionnaire: (I = Independent and D = Dependent) ADLs: assist  Bathing/Dressing- Assist  Meal Prep- D  Eating- I  Maintaining continence- I  Transferring/Ambulation-  D  Managing Meds- I  Follow up appointments reviewed:  PCP Hospital f/u appt confirmed? Yes  Scheduled to see Larae Grooms, NP on 11/14/20 @ 8:20am. Specialist Hospital f/u appt confirmed? Yes  Scheduled to see Dr. Sula Soda on 10/14/20 @ 2:20pm. Neurology Appt with Ihor Austin, NP 11/06/2020 @ 8:15am Are transportation arrangements needed? No  If their condition worsens, is the pt aware to call PCP or go to the Emergency Dept.? Yes Was the patient provided with contact information for the PCP's office or ED? Yes Was to pt encouraged to call back with questions or concerns? Yes

## 2020-10-04 ENCOUNTER — Ambulatory Visit: Payer: BC Managed Care – PPO

## 2020-10-07 ENCOUNTER — Ambulatory Visit: Payer: BC Managed Care – PPO | Admitting: Occupational Therapy

## 2020-10-07 ENCOUNTER — Ambulatory Visit: Payer: BC Managed Care – PPO

## 2020-10-07 DIAGNOSIS — F4322 Adjustment disorder with anxiety: Secondary | ICD-10-CM | POA: Diagnosis not present

## 2020-10-07 DIAGNOSIS — F411 Generalized anxiety disorder: Secondary | ICD-10-CM | POA: Diagnosis not present

## 2020-10-09 ENCOUNTER — Ambulatory Visit: Payer: BC Managed Care – PPO | Admitting: Occupational Therapy

## 2020-10-09 ENCOUNTER — Ambulatory Visit: Payer: BC Managed Care – PPO

## 2020-10-11 ENCOUNTER — Telehealth: Payer: Self-pay | Admitting: Physical Medicine & Rehabilitation

## 2020-10-11 NOTE — Telephone Encounter (Signed)
We received and filled out paperwork for [employer] and FMLA. Per call to patient he no longer needs the paperwork. It was approved without the paperwork. He has not paid for this paperwork. If it is needed in the near future, it will be in blue binder at front desk.

## 2020-10-14 ENCOUNTER — Encounter: Payer: BC Managed Care – PPO | Admitting: Physical Medicine and Rehabilitation

## 2020-10-15 ENCOUNTER — Telehealth: Payer: Self-pay | Admitting: Physical Medicine & Rehabilitation

## 2020-10-15 ENCOUNTER — Ambulatory Visit: Payer: BC Managed Care – PPO

## 2020-10-15 ENCOUNTER — Encounter: Payer: Self-pay | Admitting: Occupational Therapy

## 2020-10-15 ENCOUNTER — Ambulatory Visit: Payer: BC Managed Care – PPO | Admitting: Occupational Therapy

## 2020-10-15 ENCOUNTER — Other Ambulatory Visit: Payer: Self-pay

## 2020-10-15 DIAGNOSIS — H543 Unqualified visual loss, both eyes: Secondary | ICD-10-CM

## 2020-10-15 DIAGNOSIS — M6281 Muscle weakness (generalized): Secondary | ICD-10-CM

## 2020-10-15 DIAGNOSIS — R2681 Unsteadiness on feet: Secondary | ICD-10-CM

## 2020-10-15 DIAGNOSIS — R278 Other lack of coordination: Secondary | ICD-10-CM | POA: Diagnosis not present

## 2020-10-15 DIAGNOSIS — R2689 Other abnormalities of gait and mobility: Secondary | ICD-10-CM

## 2020-10-15 DIAGNOSIS — I7774 Dissection of vertebral artery: Secondary | ICD-10-CM | POA: Diagnosis not present

## 2020-10-15 NOTE — Therapy (Signed)
[facility] MAIN [facility] SERVICES [address] [place], [place], [address] Phone: [phone]   Fax:  [phone]  Occupational Therapy Treatment  Patient Details  Name: Curtis [name] MRN: [id number] Date of Birth: January 22, 1986 Referring Provider (OT): [name]   Encounter Date: 10/15/2020   OT End of Session - 10/15/20 1759     Visit Number 2    Number of Visits 24    Date for OT Re-Evaluation 12/25/20    Authorization Time Period Progress report period starting 10/02/2020    OT Start Time 1517    OT Stop Time 1603    OT Time Calculation (min) 46 min    Activity Tolerance Patient tolerated treatment well    Behavior During Therapy [facility] for tasks assessed/performed             History reviewed. No pertinent past medical history.  History reviewed. No pertinent surgical history.  There were no vitals filed for this visit.   Subjective Assessment - 10/15/20 1758     Subjective  Pt. was present with his wife, and 43 month old daughter.    Patient is accompanied by: Family member    Pertinent History Pt. is a 35 y.o. male who was diagnosed with Vertebral Artery Dissection, and Small Acute CVA. Pt. has Dizziness, nausea, diplopia, and fatigue. Pt.'s 1st child was born 3 months ago. Pt. 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    Currently in Pain? Yes    Pain Score 6     Pain Location Neck    Pain Orientation Left    Pain Descriptors / Indicators Aching    Pain Type Chronic pain    Pain Onset More than a month ago            OT TREATMENT    Therapeutic Exercise:  Pt. performed gross gripping with a gross grip strengthener. Pt. worked on sustaining grip while grasping pegs and reaching at various heights. The Gripper was set to  28.9 # of grip strength resistance. Pt. Worked on pinch strengthening in the left hand for lateral, and 3pt. pinch using yellow, red, green, blue, blue resistive clips. Pt. worked on placing the  clips at various vertical and horizontal angles. Tactile and verbal cues were required for eliciting the desired movement.  Patient works reaching to place the clips to the far left. Pt. performed resistive EZ Board exercises for forearm supination/pronation, wrist flexion/extension using gross grasp, and lateral pinch (key) grasp. Pt. performed resistive EZ Board exercises angled in several planes to promote shoulder flexion, abduction, and wrist flexion, and extension while performing resistive wrist flexion and extension with a gross grip.   Patient has returned to the clinic after having COVID last week.  Patient continues to have left-sided neck pain.  Patient continues to have a hard time holding his infant daughter, and reports that his left UE weakness hinders his ability to attempt placing her in a position to be burped over his shoulder.  Reviewed alternative position options . Patient reports using a Bopee Pillow to help support his forearm, when holding his infant daughter.  Patient worked on Medical illustrator strategies.  Patient utilized horizontal and horizontal rectilinear visual search strategies. Pt. Was able to accurately complete the single letter and word searches. Pt. required each additional line to be blocked.  Patient utilized horizontal horizontal rectilinear, vertical and vertical rectilinear visual search strategies and completing the plain cervical search. Patient randomly numbered  circles on the left side of the circle. Patient continues to work improving left upper extremity function, and visual compensatory strategies, in order to work towards improving, and maximizing independence with ADLs and IADL tasks.                        OT Education - 10/15/20 1759     Education Details OT services POC,and goals    Person(s) Educated Patient    Methods Explanation    Comprehension Verbalized understanding              OT Short Term Goals -  10/02/20 1650       OT SHORT TERM GOAL #1   Title Pt. will improve FOTO score by 3 points for clinically relevant change during ADLs, and IADL tasks.    Baseline Eval: FOTO score 63 TR score 75    Time 12    Period Weeks    Status New    Target Date 11/13/20               OT Long Term Goals - 10/02/20 1652       OT LONG TERM GOAL #1   Title Pt. will improve UE strength in order to be able to hold, and feed his baby with modified independence.    Baseline Eval: Pt. is unable to hold, and feed his baby.    Time 12    Period Weeks    Status New    Target Date 12/25/20      OT LONG TERM GOAL #2   Title Pt. will perform light homemaking tasks with modified indepedence in standing    Baseline EVal: Limited standing tolerance during home management tasks.    Time 12    Period Weeks    Status New    Target Date 12/25/20      OT LONG TERM GOAL #3   Title Pt. will independently demonstrate visual compensatory strategies 100% of the time during tabletop ADLs, and IADL tasks.    Baseline Eval: Pt. has difficulty    Time 12    Period Weeks    Status New    Target Date 12/25/20      OT LONG TERM GOAL #4   Title Pt. will demonstrate visual compensatory strategies 100% of the time while performing IADL tasks within his extrpersonal space.    Baseline Eval: Pt. has difficulty    Time 12    Period Weeks    Status New    Target Date 12/25/20                   Plan - 10/15/20 1800     Clinical Impression Statement Patient has returned to the clinic after having COVID last week.  Patient continues to have left-sided neck pain.  Patient continues to have a hard time holding his infant daughter, and reports that his left UE weakness hinders his ability to attempt placing her in a position to be burped over his shoulder.  Reviewed alternative position options . Patient reports using a Bopee Pillow to help support his forearm, when holding his infant daughter.  Patient worked  on Medical illustrator strategies.  Patient utilized horizontal and horizontal rectilinear visual search strategies. Pt. Was able to accurately complete the single letter and word searches. Pt. required each additional line to be blocked.  Patient utilized horizontal horizontal rectilinear, vertical and vertical rectilinear visual search strategies and completing  the plain cervical search. Patient randomly numbered circles on the left side of the circle. Patient continues to work improving left upper extremity function, and visual compensatory strategies, in order to work towards improving, and maximizing independence with ADLs and IADL tasks.    OT Occupational Profile and History Detailed Assessment- Review of Records and additional review of physical, cognitive, psychosocial history related to current functional performance    Occupational performance deficits (Please refer to evaluation for details): IADL's;ADL's    Body Structure / Function / Physical Skills ADL;IADL    Rehab Potential Good    Clinical Decision Making Several treatment options, min-mod task modification necessary    Comorbidities Affecting Occupational Performance: May have comorbidities impacting occupational performance    Modification or Assistance to Complete Evaluation  Min-Moderate modification of tasks or assist with assess necessary to complete eval    OT Frequency 3x / week    OT Duration 12 weeks    OT Treatment/Interventions Therapeutic exercise;DME and/or AE instruction;Patient/family education;Therapeutic activities;Self-care/ADL training    Consulted and Agree with Plan of Care Patient             Patient will benefit from skilled therapeutic intervention in order to improve the following deficits and impairments:   Body Structure / Function / Physical Skills: ADL, IADL       Visit Diagnosis: Muscle weakness (generalized)  Low vision, both eyes    Problem List Patient Active Problem  List   Diagnosis Date Noted   Slow transit constipation    Morbid obesity (HCC)    Neck pain    ASD (atrial septal defect)    Ischemic cerebrovascular accident (CVA) of frontal lobe (HCC) 09/13/2020   Stroke (HCC) 09/10/2020   Lateral medullary syndrome    Vertebral artery dissection (HCC) 09/09/2020   Obesity (BMI 35.0-39.9 without comorbidity) 09/09/2020   Essential hypertension 09/09/2020   Acute stroke of medulla oblongata (HCC)     Olegario Messier, MS, OTR/L 10/15/2020, 6:02 PM  [facility] [facility] MAIN [facility] SERVICES [address] [place], [place], [address] Phone: [phone]   Fax:  [phone]  Name: Curtis [name] MRN: [id number] Date of Birth: 1985-02-02

## 2020-10-15 NOTE — Therapy (Signed)
[facility] MAIN [facility] SERVICES [address] [place], [place], [address] Phone: [phone]   Fax:  [phone]  Physical Therapy Treatment  Patient Details  Name: Curtis [name] MRN: [id number] Date of Birth: 11/17/1985 Referring Provider (PT): Alfredo Batty   Encounter Date: 10/15/2020   PT End of Session - 10/15/20 1545     Visit Number 2    Number of Visits 24    Date for PT Re-Evaluation 12/25/20    Authorization Type [employer], no auth req    Progress Note Due on Visit 10    PT Start Time 1603    PT Stop Time 1645    PT Time Calculation (min) 42 min    Equipment Utilized During Treatment Gait belt    Activity Tolerance Patient tolerated treatment well    Behavior During Therapy [facility] for tasks assessed/performed             History reviewed. No pertinent past medical history.  History reviewed. No pertinent surgical history.  There were no vitals filed for this visit.   Subjective Assessment - 10/15/20 1559     Subjective Patient returning to therapy after illness/COVID. Had OT prior to PT session. Has been compliant with walking,    Patient is accompained by: Family member   [name]   Pertinent History Mr. Barrell is a 35 y.o. M with obesity no other significant PMHx presented to hospital  with acute onset nausea, dizziness.     Patient had recently seen a chiropractor and being treated with cervical manipulation.  On the morning of admission he developed sudden dizziness, nausea, neck pain, and headache and so he came to the ER.    CT angiogram was obtained that showed findings consistent with dissection of the left vertebral artery.  MRI brain showed small ischemic infarct in the left lateral medulla. Pt had stay at inpatient rehab and has made significant progress in his funciton since discharge. Pt also has 3 m.o. daughter he would like to be able to have a larger part in caring for but is restricted due to his  impairments. at [facility].E. patient presented to therapy ambulating with rollator.    Limitations Lifting;Walking;Standing    How long can you sit comfortably? no issues with sitting    How long can you stand comfortably? 5-10 min    How long can you walk comfortably? 30 min (at grocery store)    Patient Stated Goals holding dighter, being able to return to golf    Currently in Pain? Yes    Pain Score 6     Pain Location Neck    Pain Orientation Left    Pain Descriptors / Indicators Aching    Pain Type Chronic pain    Pain Onset More than a month ago    Pain Frequency Intermittent                  Standing in // bars:   Green dynadisc under LE for weight shift onto opp LE 30 seconds x 2 trials each LE placement  -progressed to weighted chest press and straight arm raise each LE placement 10x ; 2 sets   Bosu ball:  -flat side up static stand 30 seconds -round side up: modified lunges finger tip support 12x each LE  Kore Balance for weight shift, center of mass, and coordinated return to center x 4 minutes x 2 trials.   Seated: Upper trap stretch 30 second holds (added  to HEP)  HEP review and demonstration with patient performing:   Access Code: [email or web address] URL: [email or web address] Date: 10/15/2020 Prepared by: Precious Bard  Exercises  Seated Weight Shifting Without Arm Support - 1 x daily - 7 x weekly - 2 sets - 10 reps - 5 hold Side to Side Weight Shift with Counter Support - 1 x daily - 7 x weekly - 2 sets - 10 reps - 5 hold Standing March with Counter Support - 1 x daily - 7 x weekly - 2 sets - 10 reps - 5 hold Heel Raises with Counter Support - 1 x daily - 7 x weekly - 2 sets - 10 reps - 5 hold Seated Ankle Alphabet - 1 x daily - 7 x weekly - 2 sets - 10 reps - 5 hold  Pt educated throughout session about proper posture and technique with exercises. Improved exercise technique, movement at target joints, use of target muscles after min to mod verbal,  visual, tactile cues.    Patient presents to physical therapy with excellent motivation. He is educated on HEP with focus on weight shift and coordination with strengthening. Education on finding center of mass and weight shifting with pelvis rather than shoulders for stabilization tolerated well with need of use of mirror for external cue. Patient is able to restabilize self with Ue's but will continue to require education and training for stabilization without UE support. Patient will continue to benefit from skilled physical therapy to improve stability, mobility and decrease risk for falls to return to PLOF.              PT Education - 10/15/20 1545     Education Details exercise technique, body mechanics    Person(s) Educated Patient    Methods Explanation;Demonstration;Tactile cues;Verbal cues    Comprehension Verbalized understanding;Returned demonstration;Verbal cues required;Tactile cues required              PT Short Term Goals - 10/02/20 1721       PT SHORT TERM GOAL #1   Title Patient will be independent in home exercise program to improve strength/mobility for better functional independence with ADLs.    Baseline Pt has HEP from hospital discharge but it is not progressive    Time 4    Period Weeks    Status New    Target Date 10/30/20      PT SHORT TERM GOAL #2   Title Patient (< 33 years old) will complete five times sit to stand test in < 15 seconds indicating an increased LE strength and improved balance.    Baseline >20 sec    Time 4    Period Weeks    Target Date 10/30/20      PT SHORT TERM GOAL #3   Title Patient will increase six minute walk test distance to >1200 with LRAD for progression to community ambulator and improve gait ability    Baseline 1015 feet with rollator    Time 4    Period Weeks    Status New               PT Long Term Goals - 10/02/20 1724       PT LONG TERM GOAL #1   Title Patient will increase FOTO score to  equal to or greater than 15    to demonstrate statistically significant improvement in mobility and quality of life.    Baseline 63.5    Time 12    Period Weeks  Status New    Target Date 12/25/20      PT LONG TERM GOAL #2   Title Patient will increase Functional Gait Assessment score to >25/30 as to reduce fall risk and improve dynamic gait safety with community ambulation.    Baseline 20    Time 12    Period Weeks    Status New    Target Date 12/25/20      PT LONG TERM GOAL #3   Title Patient will ascend/descend 4 stairs without rail assist independently without loss of balance to improve ability to get in/out of home.    Baseline requires use of handrails to safely naigate stairs    Time 12    Period Weeks    Status New      PT LONG TERM GOAL #4   Title Patient will increase six minute walk test distance to >1300 without AD for progression to community ambulator and improve gait ability    Baseline 1015 with rollator    Time 12    Period Weeks    Target Date 12/25/20                   Plan - 10/16/20 0716     Clinical Impression Statement Patient presents to physical therapy with excellent motivation. He is educated on HEP with focus on weight shift and coordination with strengthening. Education on finding center of mass and weight shifting with pelvis rather than shoulders for stabilization tolerated well with need of use of mirror for external cue. Patient is able to restabilize self with Ue's but will continue to require education and training for stabilization without UE support. Patient will continue to benefit from skilled physical therapy to improve stability, mobility and decrease risk for falls to return to PLOF.    Personal Factors and Comorbidities Comorbidity 1;Comorbidity 2    Comorbidities obesity, HTN    Examination-Activity Limitations Caring for Others;Carry;Lift;Locomotion Level;Squat;Stairs;Stand    Examination-Participation Restrictions  Driving;Occupation;Yard Work    Conservation officer, historic buildings Evolving/Moderate complexity    Rehab Potential Excellent    PT Frequency 2x / week    PT Duration 12 weeks    PT Treatment/Interventions ADLs/Self Care Home Management;Neuromuscular re-education;Patient/family education;Manual techniques;Passive range of motion;Dry needling;Energy conservation;Vestibular;Visual/perceptual remediation/compensation;Therapeutic activities;Functional mobility training;Therapeutic exercise;Balance training;Gait training    PT Next Visit Plan MiniBest or other outcome measure, develop and provide HEP,  consider use of mirror to assist with  finding neutral    PT Home Exercise Plan To provide next session    Consulted and Agree with Plan of Care Patient             Patient will benefit from skilled therapeutic intervention in order to improve the following deficits and impairments:  Abnormal gait, Decreased activity tolerance, Decreased balance, Decreased coordination, Decreased endurance, Decreased mobility, Decreased range of motion, Decreased strength, Difficulty walking, Dizziness, Impaired perceived functional ability, Impaired vision/preception  Visit Diagnosis: Other abnormalities of gait and mobility  Unsteadiness on feet  Muscle weakness (generalized)     Problem List Patient Active Problem List   Diagnosis Date Noted   Slow transit constipation    Morbid obesity (HCC)    Neck pain    ASD (atrial septal defect)    Ischemic cerebrovascular accident (CVA) of frontal lobe (HCC) 09/13/2020   Stroke (HCC) 09/10/2020   Lateral medullary syndrome    Vertebral artery dissection (HCC) 09/09/2020   Obesity (BMI 35.0-39.9 without comorbidity) 09/09/2020   Essential hypertension 09/09/2020  Acute stroke of medulla oblongata (HCC)     Precious Bard, PT, DPT  10/16/2020, 7:18 AM  [place] [facility] MAIN [facility] SERVICES [address] [place],  [place], [address] Phone: [phone]   Fax:  [phone]  Name: Curtis [name] MRN: [id number] Date of Birth: 1985/07/16

## 2020-10-15 NOTE — Telephone Encounter (Signed)
Called patient notified disability paperwork is complete and ready for payment & pickup

## 2020-10-17 ENCOUNTER — Ambulatory Visit (INDEPENDENT_AMBULATORY_CARE_PROVIDER_SITE_OTHER): Payer: BC Managed Care – PPO | Admitting: Adult Health

## 2020-10-17 ENCOUNTER — Encounter: Payer: Self-pay | Admitting: Adult Health

## 2020-10-17 ENCOUNTER — Ambulatory Visit: Payer: BC Managed Care – PPO | Admitting: Occupational Therapy

## 2020-10-17 ENCOUNTER — Other Ambulatory Visit: Payer: Self-pay

## 2020-10-17 VITALS — BP 128/88 | HR 88 | Ht 72.0 in | Wt 295.1 lb

## 2020-10-17 DIAGNOSIS — R278 Other lack of coordination: Secondary | ICD-10-CM

## 2020-10-17 DIAGNOSIS — R2689 Other abnormalities of gait and mobility: Secondary | ICD-10-CM | POA: Diagnosis not present

## 2020-10-17 DIAGNOSIS — I7774 Dissection of vertebral artery: Secondary | ICD-10-CM

## 2020-10-17 DIAGNOSIS — M6281 Muscle weakness (generalized): Secondary | ICD-10-CM

## 2020-10-17 DIAGNOSIS — I639 Cerebral infarction, unspecified: Secondary | ICD-10-CM | POA: Diagnosis not present

## 2020-10-17 DIAGNOSIS — R2681 Unsteadiness on feet: Secondary | ICD-10-CM | POA: Diagnosis not present

## 2020-10-17 DIAGNOSIS — H543 Unqualified visual loss, both eyes: Secondary | ICD-10-CM | POA: Diagnosis not present

## 2020-10-17 DIAGNOSIS — G464 Cerebellar stroke syndrome: Secondary | ICD-10-CM

## 2020-10-17 MED ORDER — CLOPIDOGREL BISULFATE 75 MG PO TABS: 75.0000 mg | ORAL_TABLET | Freq: Every day | ORAL | 0 refills | Status: DC

## 2020-10-17 MED ORDER — MECLIZINE HCL 50 MG PO TABS: 50.0000 mg | ORAL_TABLET | Freq: Three times a day (TID) | ORAL | 5 refills | Status: DC

## 2020-10-17 MED ORDER — GABAPENTIN 300 MG PO CAPS: 300.0000 mg | ORAL_CAPSULE | Freq: Three times a day (TID) | ORAL | 5 refills | Status: DC

## 2020-10-17 MED ORDER — ATORVASTATIN CALCIUM 40 MG PO TABS: 40.0000 mg | ORAL_TABLET | Freq: Every day | ORAL | 3 refills | Status: DC

## 2020-10-17 NOTE — Progress Notes (Signed)
[facility] [address] [place]. [place] [address] [phone]       HOSPITAL FOLLOW UP NOTE  [name] Date of Birth:  March 30, 1985 Medical Record Number:  [id number]   Reason for Referral:  hospital stroke follow up    SUBJECTIVE:   CHIEF COMPLAINT:  Chief Complaint  Patient presents with   Hospitalization Follow-up    Rm 3 with wife.  Continued diplopia, RUE sensory impairment and gait impairment -overall improving     HPI:   Curtis [name] is a 35 year old male w/pmh of DJD, neck pain and headache who presented to [facility] on 09/09/2020 with dizziness, vertigo, nausea and leaning towards the left.  Stroke work-up completed at [facility] 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 He was discharged to [facility] CIR on 09/13/2020.  Neurology consulted at [facility] due to recent stroke.  Evaluated by Dr. Roda Shutters who recommended continuation of aspirin and Plavix for 3 months and follow-up with repeat CT head/neck 2-3 months.  EF 65 to 70% however ASD by color Doppler flow likely to be asymptomatic incidental finding and not associated with current stroke which was felt most likely due to VA dissection from neck manipulation.  He was advised to follow-up with cardiology outpatient for further management.  A1c 5.4.  LDL 67. Recommended continued participation with PT/OT at CIR with residual diplopia, left-sided numbness and headache.    Today, 10/17/2020, Curtis [name] is being seen for hospital follow-up accompanied by his wife.  He has been doing well since discharge.  Reports continued improvement of residual diplopia, RUE sensory deficit and gait impairment.  Currently working with therapies.  Continued use of eye patch over left eye.  Occasional use of RW but today was able to ambulate with AD but was assisted by his wife.  Reports RUE sensory improving and starting to experience a tingling sensation.  He continues to experience left-sided neck pain and  left-sided headache (headache present with increased neck pain). C/o neck pain PTA with wife concerned regarding worsening after his stroke.  He is currently being followed by Antoine Primas, DO orthopedic.  Feels like neck pain is worse in the morning upon awakening and in the evening.  He remains on short-term disability.  Denies new stroke/TIA symptoms.  Remains on aspirin and Plavix with mild bruising but no bleeding.  Remains on atorvastatin 40 mg daily without side effects.  Blood pressure today 128/88.  No further concerns at this time.     PERTINENT IMAGING  CT HEAD 09/09/2020 IMPRESSION: CT head: 1. No evidence of acute intracranial abnormality. 2. Paranasal sinus disease, as described. Correlate for acute sinusitis.    CT ANGIO HEAD NECK  IMPRESSION: 09/09/2020 1. Irregularity of the V2 segment of the left vertebral artery with sites of up to mild/moderate stenosis. Irregularity of the V4 left vertebral artery with sites of up to moderate/severe stenosis. These findings are highly suspicious for left vertebral artery dissection given the provided history. A brain MRI is recommended to assess for acute intracranial ischemia. 2. Elsewhere within the head and neck, there is no large vessel occlusion or proximal high-grade arterial stenosis. 3. Aberrant right subclavian artery. The right subclavian artery origin is incompletely included in the field of view.  IMPRESSION 09/11/2020 IMPRESSION: No acute intracranial abnormality. Small infarct of medulla is better seen on prior MRI.   Persistent findings of left vertebral artery dissection extracranially and intracranially. Slightly increased narrowing at the level of  the C2 transverse foramen. Remains up to severe stenosis intracranially.   MR BRAIN WO CONTRAST  IMPRESSION: 09/09/2020 Small acute stroke of the lateral left medulla.  IMPRESSION: 09/11/2020 More conspicuous signal abnormality in the left medulla that  could possibly be evolutionary change of the previous stroke, though some extension is not excluded. Petechial blood products in the region without measurable hematoma.   MR CERVICAL SPINE WO CONTRAST 09/09/2020 IMPRESSION: No abnormal cord signal.  No canal or foraminal stenosis.          ROS:   14 system review of systems performed and negative with exception of those listed in HPI  PMH: No past medical history on file.  PSH: No past surgical history on file.  Social History:  Social History   Socioeconomic History   Marital status: Married    Spouse name: Not on file   Number of children: Not on file   Years of education: Not on file   Highest education level: Not on file  Occupational History   Not on file  Tobacco Use   Smoking status: Some Days    Types: Cigars   Smokeless tobacco: Never  Substance and Sexual Activity   Alcohol use: Yes    Alcohol/week: 2.0 standard drinks    Types: 2 Shots of liquor per week   Drug use: Never   Sexual activity: Not on file  Other Topics Concern   Not on file  Social History Narrative   Not on file   Social Determinants of Health   Financial Resource Strain: Not on file  Food Insecurity: Not on file  Transportation Needs: Not on file  Physical Activity: Not on file  Stress: Not on file  Social Connections: Not on file  Intimate Partner Violence: Not on file    Family History: No family history on file.  Medications:   Current Outpatient Medications on File Prior to Visit  Medication Sig Dispense Refill   acetaminophen-codeine (TYLENOL #3) 300-30 MG tablet Take 1 tablet by mouth every 6 (six) hours as needed for moderate pain. 30 tablet 0   aspirin EC 81 MG EC tablet Take 1 tablet (81 mg total) by mouth daily. Swallow whole. 30 tablet 11   cholecalciferol (VITAMIN D3) 25 MCG (1000 UNIT) tablet Take 1,000 Units by mouth daily.     lidocaine (LIDODERM) 5 % Place 1 patch onto the skin daily. Remove & Discard  patch within 12 hours or as directed by MD 30 patch 0   methocarbamol (ROBAXIN) 750 MG tablet Take 1 tablet (750 mg total) by mouth every 8 (eight) hours as needed for muscle spasms. 90 tablet 0   vitamin B-12 (CYANOCOBALAMIN) 1000 MCG tablet Take 1,000 mcg by mouth daily.     No current facility-administered medications on file prior to visit.    Allergies:  No Known Allergies    OBJECTIVE:  Physical Exam  Vitals:   10/17/20 1203  BP: 128/88  Pulse: 88  SpO2: 98%  Weight: 295 lb 2 oz (133.9 kg)  Height: 6' (1.829 m)   Body mass index is 40.03 kg/m. No results found.  Post stroke PHQ 2/9 Depression screen PHQ 2/9 10/17/2020  Decreased Interest 0  Down, Depressed, Hopeless 0  PHQ - 2 Score 0     General: well developed, well nourished, very pleasant young Caucasian male, seated, in no evident distress Head: head normocephalic and atraumatic.   Neck: supple with no carotid or supraclavicular bruits Cardiovascular: regular rate and rhythm,  no murmurs Musculoskeletal: no deformity Skin:  no rash/petichiae Vascular:  Normal pulses all extremities   Neurologic Exam Mental Status: Awake and fully alert.  Fluent speech and language.  Oriented to place and time. Recent and remote memory intact. Attention span, concentration and fund of knowledge appropriate. Mood and affect appropriate.  Cranial Nerves: Fundoscopic exam reveals sharp disc margins. Pupils equal, briskly reactive to light. Extraocular movements full without nystagmus. OS convergence insufficiency.  Visual fields full to confrontation. Hearing intact. Facial sensation intact. Face, tongue, palate moves normally and symmetrically.  Motor: Normal bulk and tone. Normal strength in all tested extremity muscles except slight LUE pronator drift and decreased hand dexterity Sensory.: intact to touch , pinprick , position and vibratory sensation except altered light touch sensation LUE.  Coordination: Rapid alternating  movements normal in all extremities. Finger-to-nose mild LUE ataxia and heel-to-shin performed accurately bilaterally. Gait and Station: Arises from chair without difficulty. Stance is normal. Gait demonstrates normal stride length and mild imbalance with assistance of his wife.  Tandem walk and heel toe not attempted.  Reflexes: 1+ and symmetric. Toes downgoing.     NIHSS  1 Modified Rankin  3      ASSESSMENT: Curtis [name] is a 35 y.o. 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 Work up completed at [facility] and was evaluated by Dr. Roda Shutters during [facility] CIR admission.  Vascular risk factors include DJD.      PLAN:  Left lateral medullary stroke:  Residual deficit: Diplopia, gait impairment with imbalance and LUE weakness with sensory impairment.  Discussed typical recovery timeframe.  Encourage continued participation with outpatient therapies for hopeful ongoing improvement.  Refills for all medications provided as he plans to establish care with PCP next month.  Continued short-term disability assisted by PMR Continue aspirin 81 mg daily and clopidogrel 75 mg daily for total of 3 months then aspirin alone and continuation of atorvastatin for secondary stroke prevention.   Discussed secondary stroke prevention measures and importance of close PCP follow up for aggressive stroke risk factor management. I have gone over the pathophysiology of stroke, warning signs and symptoms, risk factors and their management in some detail with instructions to go to the closest emergency room for symptoms of concern. VA dissection: Likely in setting of neck manipulation from chiropractor treatments.  Discussed typical healing timeframe.  Discussed baseline neck pain likely worsened in setting of dissection but should continue to improve as this heals.  Repeat CTA head/neck mid October for surveillance monitoring.  Discussed neck precautions HLD: LDL goal <70.  Recent LDL 67.  Continue atorvastatin -refill provided     Follow up in 3 months or call earlier if needed   CC:  [facility] provider: Dr. Pearlean Brownie PCP: Larae Grooms, NP    I spent 57 minutes of face-to-face and non-face-to-face time with patient and wife.  This included previsit chart review including review of recent hospitalization, lab review, study review, order entry, electronic health record documentation, patient and wife education regarding recent stroke including etiology, secondary stroke prevention measures and importance of managing stroke risk factors, residual deficits and typical recovery time, repeat imaging for L VA dissection and answered all other questions to patient and wife's satisfaction  Ihor Austin, AGNP-BC  [facility] [address] [place], [place] [address]  Phone [phone] Fax [phone] Note: This document was prepared with digital dictation and possible smart phrase technology. Any transcriptional errors that result  from this process are unintentional.

## 2020-10-17 NOTE — Patient Instructions (Addendum)
Continue aspirin 81 mg daily and clopidogrel 75 mg daily  and atorvastatin  for secondary stroke prevention  Continue plavix for additional 2 months then just aspirin alone   Continue to follow up with PCP regarding cholesterol and blood pressure management  Maintain strict control of hypertension with blood pressure goal below 130/90 and cholesterol with LDL cholesterol (bad cholesterol) goal below 70 mg/dL.   We will plan on doing a CTA head mid next month for follow up      Followup in the future with me in 3 months or call earlier if needed      Thank you for coming to see [place] at [facility]. I hope we have been able to provide you high quality care today.  You may receive a patient satisfaction survey over the next few weeks. We would appreciate your feedback and comments so that we may continue to improve ourselves and the health of our patients.

## 2020-10-18 ENCOUNTER — Encounter: Payer: Self-pay | Admitting: Occupational Therapy

## 2020-10-18 NOTE — Therapy (Signed)
[name] MAIN [name] SERVICES [address] [place], [place], [address] Phone: [phone]   Fax:  [phone]  Occupational Therapy Treatment  Patient Details  Name: Curtis [name] MRN: [id number] Date of Birth: 06/15/1985 Referring Provider (OT): [name]   Encounter Date: 10/17/2020   OT End of Session - 10/18/20 1947     Visit Number 3    Number of Visits 24    Date for OT Re-Evaluation 12/25/20    Authorization Time Period Progress report period starting 10/02/2020    OT Start Time 1515    OT Stop Time 1600    OT Time Calculation (min) 45 min    Activity Tolerance Patient tolerated treatment well    Behavior During Therapy [facility] for tasks assessed/performed             History reviewed. No pertinent past medical history.  History reviewed. No pertinent surgical history.  There were no vitals filed for this visit.   Subjective Assessment - 10/18/20 1946     Subjective  Pt. was present with his wife, and 75 month old daughter.    Patient is accompanied by: Family member    Pertinent History Pt. is a 35 y.o. male who was diagnosed with Vertebral Artery Dissection, and Small Acute CVA. Pt. has Dizziness, nausea, diplopia, and fatigue. Pt.'s 1st child was born 3 months ago. Pt. 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    Currently in Pain? Yes    Pain Score 8     Pain Location Neck    Pain Orientation Left    Pain Descriptors / Indicators Aching    Pain Type Chronic pain            OT TREATMENT     Therapeutic Exercise:   Pt. performed gross gripping with a gross grip strengthener. Pt. worked on sustaining grip while grasping pegs and reaching at various heights. The Gripper was set to  28.9 # of grip strength resistance. Pt. Worked on pinch strengthening in the left hand for lateral, and 3pt. pinch using yellow, red, green, blue, and black resistive clips. Pt. worked on placing the clips at various vertical and  horizontal angles. Tactile and verbal cues were required for eliciting the desired movement.  Patient worked on reaching to place the clips to the far left. Pt. performed resistive EZ Board exercises for forearm supination/pronation, wrist flexion/extension using gross grasp, and lateral pinch (key) grasp. Pt. performed resistive EZ Board exercises angled in several planes to promote shoulder flexion, abduction, and wrist flexion, and extension while performing resistive wrist flexion and extension with a gross grip.   Neuromuscular reeducation:  Pt. worked on grasping, flipping, turning, and stacking [place] style discs 4 rows of 15. Pt. required visual demonstration, and cues for movement patterns. Pt. worked on speed, and Engineer, agricultural.  Pt. education was provided about fine motor coordination skills and transitory movements of the hand with small objects.   Patient continues to have left-sided neck pain. Patient worked on Building surveyor and visual search strategies for organized and random visual search tasks.  Patient utilized horizontal and horizontal rectilinear visual search strategies for both organized and random search tasks.  Patient consistently circled items above the row in the center of the task. Pt. was able to accurately complete the single letter and word searches.  Patient presented with being less over or under reaching targets on the left, however at the end of the task tented with  over and under reaching the target to the very far right left. Patient continues to work on improving left upper extremity function, and visual compensatory strategies, in order to work towards improving, and maximizing independence with ADLs and IADL tasks.                            OT Education - 10/18/20 1947     Education Details OT services POC,and goals    Person(s) Educated Patient    Methods Explanation    Comprehension Verbalized understanding               OT Short Term Goals - 10/02/20 1650       OT SHORT TERM GOAL #1   Title Pt. will improve FOTO score by 3 points for clinically relevant change during ADLs, and IADL tasks.    Baseline Eval: FOTO score 63 TR score 75    Time 12    Period Weeks    Status New    Target Date 11/13/20               OT Long Term Goals - 10/02/20 1652       OT LONG TERM GOAL #1   Title Pt. will improve UE strength in order to be able to hold, and feed his baby with modified independence.    Baseline Eval: Pt. is unable to hold, and feed his baby.    Time 12    Period Weeks    Status New    Target Date 12/25/20      OT LONG TERM GOAL #2   Title Pt. will perform light homemaking tasks with modified indepedence in standing    Baseline EVal: Limited standing tolerance during home management tasks.    Time 12    Period Weeks    Status New    Target Date 12/25/20      OT LONG TERM GOAL #3   Title Pt. will independently demonstrate visual compensatory strategies 100% of the time during tabletop ADLs, and IADL tasks.    Baseline Eval: Pt. has difficulty    Time 12    Period Weeks    Status New    Target Date 12/25/20      OT LONG TERM GOAL #4   Title Pt. will demonstrate visual compensatory strategies 100% of the time while performing IADL tasks within his extrpersonal space.    Baseline Eval: Pt. has difficulty    Time 12    Period Weeks    Status New    Target Date 12/25/20                   Plan - 10/18/20 1949     Clinical Impression Statement Patient continues to have left-sided neck pain. Patient worked on Building surveyor and visual search strategies for organized and random visual search tasks.  Patient utilized horizontal and horizontal rectilinear visual search strategies for both organized and random search tasks.  Patient consistently circled items above the row in the center of the task. Pt. was able to accurately complete the single letter and word searches.  Patient  presented with being less over or under reaching targets on the left, however at the end of the task tented with over and under reaching the target to the very far right left. Patient continues to work on improving left upper extremity function, and visual compensatory strategies, in order to work towards improving, and  maximizing independence with ADLs and IADL tasks.   OT Occupational Profile and History Detailed Assessment- Review of Records and additional review of physical, cognitive, psychosocial history related to current functional performance    Occupational performance deficits (Please refer to evaluation for details): IADL's;ADL's    Body Structure / Function / Physical Skills ADL;IADL    Rehab Potential Good    Clinical Decision Making Several treatment options, min-mod task modification necessary    Comorbidities Affecting Occupational Performance: May have comorbidities impacting occupational performance    Modification or Assistance to Complete Evaluation  Min-Moderate modification of tasks or assist with assess necessary to complete eval    OT Frequency 3x / week    OT Duration 12 weeks    OT Treatment/Interventions Therapeutic exercise;DME and/or AE instruction;Patient/family education;Therapeutic activities;Self-care/ADL training    Consulted and Agree with Plan of Care Patient             Patient will benefit from skilled therapeutic intervention in order to improve the following deficits and impairments:   Body Structure / Function / Physical Skills: ADL, IADL       Visit Diagnosis: Muscle weakness (generalized)  Other lack of coordination    Problem List Patient Active Problem List   Diagnosis Date Noted   Slow transit constipation    Morbid obesity (HCC)    Neck pain    ASD (atrial septal defect)    Ischemic cerebrovascular accident (CVA) of frontal lobe (HCC) 09/13/2020   Stroke (HCC) 09/10/2020   Lateral medullary syndrome    Vertebral artery  dissection (HCC) 09/09/2020   Obesity (BMI 35.0-39.9 without comorbidity) 09/09/2020   Essential hypertension 09/09/2020   Acute stroke of medulla oblongata (HCC)     Olegario Messier, MS, OTR/L 10/18/2020, 7:52 PM  [facility] MAIN [facility] SERVICES [address] [place], [place], [address] Phone: [phone]   Fax:  [phone]  Name: LUCIOUS ZOU MRN: [id number] Date of Birth: [birth year]

## 2020-10-18 NOTE — Progress Notes (Signed)
I agree with the above plan 

## 2020-10-21 DIAGNOSIS — F411 Generalized anxiety disorder: Secondary | ICD-10-CM | POA: Diagnosis not present

## 2020-10-21 DIAGNOSIS — F4322 Adjustment disorder with anxiety: Secondary | ICD-10-CM | POA: Diagnosis not present

## 2020-10-22 ENCOUNTER — Ambulatory Visit: Payer: BC Managed Care – PPO

## 2020-10-22 ENCOUNTER — Other Ambulatory Visit: Payer: Self-pay

## 2020-10-22 DIAGNOSIS — H543 Unqualified visual loss, both eyes: Secondary | ICD-10-CM

## 2020-10-22 DIAGNOSIS — M6281 Muscle weakness (generalized): Secondary | ICD-10-CM

## 2020-10-22 DIAGNOSIS — R2689 Other abnormalities of gait and mobility: Secondary | ICD-10-CM | POA: Diagnosis not present

## 2020-10-22 DIAGNOSIS — R2681 Unsteadiness on feet: Secondary | ICD-10-CM | POA: Diagnosis not present

## 2020-10-22 DIAGNOSIS — R278 Other lack of coordination: Secondary | ICD-10-CM

## 2020-10-22 DIAGNOSIS — I7774 Dissection of vertebral artery: Secondary | ICD-10-CM

## 2020-10-22 NOTE — Therapy (Signed)
[facility] MAIN [facility] SERVICES [address] [place], [place], [address] Phone: [phone]   Fax:  [phone]  Physical Therapy Treatment  Patient Details  Name: Curtis [name] MRN: [id number] Date of Birth: 09/24/1985 Referring Provider (PT): Alfredo Batty   Encounter Date: 10/22/2020   PT End of Session - 10/22/20 1631     Visit Number 3    Number of Visits 24    Date for PT Re-Evaluation 12/25/20    Authorization Type [employer], no auth req    Authorization Time Period Cert 01/27/73-17/00/17    Progress Note Due on Visit 10    PT Start Time 1301    PT Stop Time 1341    PT Time Calculation (min) 40 min    Equipment Utilized During Treatment Gait belt    Activity Tolerance Patient tolerated treatment well    Behavior During Therapy [facility] for tasks assessed/performed             No past medical history on file.  No past surgical history on file.  There were no vitals filed for this visit.   Subjective Assessment - 10/22/20 1308     Subjective Pt doing well no updates todya, meds all the same. Pt returns to doctor later this week.    Pertinent History Curtis [name] is a 35 y.o. M with obesity no other significant PMHx presented to hospital  with acute onset nausea, dizziness. Patient had recently seen a chiropractor and being treated with cervical manipulation.  On the morning of admission he developed sudden dizziness, nausea, neck pain, and headache and so he came to the ER.    CT angiogram was obtained that showed findings consistent with dissection of the left vertebral artery.  MRI brain showed small ischemic infarct in the left lateral medulla. Pt had stay at inpatient rehab and has made significant progress in his funciton since discharge. Pt also has 3 m.o. daughter he would like to be able to have a larger part in caring for but is restricted due to his impairments. at I.E. patient presented to therapy ambulating with  rollator.             INTERVENTION THIS DATE:  *pt arrives with SPC, but not used for AMB in session  HEP review   Seated Weight Shifting Without Arm Support - 1 x daily - 7 x weekly - 2 sets - 10 reps - 5 hold Side to Side Weight Shift with Counter Support - 1 x daily - 7 x weekly - 2 sets - 10 reps - 5 hold Standing March with Counter Support - 1 x daily - 7 x weekly - 2 sets - 10 reps - 5 hold Heel Raises with Counter Support - 1 x daily - 7 x weekly - 2 sets - 10 reps - 5 hold Seated Ankle Alphabet - 1 x daily - 7 x weekly - 2 sets - 10 reps - 5 hold   -Four square stepping, cued speed to challenge 10x  -figure 8 walking: 8x cones 75ft apart, 5x cones 16ft apart (more dizziness) -narrow stance trunk rotation with physio ball (12" stance width) 20x total  -5000g ball BUE carry AMB -lateral side stepping c 5000g ball 2x68ft, then 2x68ft with ball toss/catch to self  -retro AMB with 500g ball toss 2x62ft -fwd high knee marching with 5000g ball carry   -90 degree trunk rotation + overhead rebound, alternating sides 30x total  -90 degree step  turn c overhead rebound x10 -180 degree step turn c overhead rebound x10  Bolded items discussed as options for HEP progression.   Chartered loss adjuster discussed including more formal assessment of strength for legs and Left shoulder.      PT Education - 10/22/20 1311     Education Details reviewed HEP and discussed compliance    Person(s) Educated Patient    Methods Explanation;Demonstration    Comprehension Verbalized understanding              PT Short Term Goals - 10/02/20 1721       PT SHORT TERM GOAL #1   Title Patient will be independent in home exercise program to improve strength/mobility for better functional independence with ADLs.    Baseline Pt has HEP from hospital discharge but it is not progressive    Time 4    Period Weeks    Status New    Target Date 10/30/20      PT SHORT TERM GOAL #2   Title Patient (< 106 years  old) will complete five times sit to stand test in < 15 seconds indicating an increased LE strength and improved balance.    Baseline >20 sec    Time 4    Period Weeks    Target Date 10/30/20      PT SHORT TERM GOAL #3   Title Patient will increase six minute walk test distance to >1200 with LRAD for progression to community ambulator and improve gait ability    Baseline 1015 feet with rollator    Time 4    Period Weeks    Status New               PT Long Term Goals - 10/02/20 1724       PT LONG TERM GOAL #1   Title Patient will increase FOTO score to equal to or greater than 15    to demonstrate statistically significant improvement in mobility and quality of life.    Baseline 63.5    Time 12    Period Weeks    Status New    Target Date 12/25/20      PT LONG TERM GOAL #2   Title Patient will increase Functional Gait Assessment score to >25/30 as to reduce fall risk and improve dynamic gait safety with community ambulation.    Baseline 20    Time 12    Period Weeks    Status New    Target Date 12/25/20      PT LONG TERM GOAL #3   Title Patient will ascend/descend 4 stairs without rail assist independently without loss of balance to improve ability to get in/out of home.    Baseline requires use of handrails to safely naigate stairs    Time 12    Period Weeks    Status New      PT LONG TERM GOAL #4   Title Patient will increase six minute walk test distance to >1300 without AD for progression to community ambulator and improve gait ability    Baseline 1015 with rollator    Time 12    Period Weeks    Target Date 12/25/20                   Plan - 10/22/20 1315     Clinical Impression Statement Patient returns for PT treatment.  Reports HEP at home has been performed with moderate compliance, however feels that activities are too easy for him  not very challenging.  Patient also concerned that his left shoulder weakness still needs to be addressed, would  like to do more challenging and relevant things to further his ability to perform caregiver duties for his 47-month-old child.  Patient tolerated session well generally, most of session performed with eyepatch, patient alternates sides ad lib.  Patient has intermittent increases in dizziness throughout session, particularly with left sidestepping, however none of them are similar to his baseline vertigo that he experiences when lying flat in supine with eyes closed.    Personal Factors and Comorbidities Comorbidity 1;Comorbidity 2    Comorbidities obesity, HTN    Examination-Activity Limitations Caring for Others;Carry;Lift;Locomotion Level;Squat;Stairs;Stand    Examination-Participation Restrictions Driving;Occupation;Yard Work    Public house manager Low    Rehab Potential Excellent    PT Frequency 2x / week    PT Duration 12 weeks    PT Treatment/Interventions ADLs/Self Care Home Management;Neuromuscular re-education;Patient/family education;Manual techniques;Passive range of motion;Dry needling;Energy conservation;Vestibular;Visual/perceptual remediation/compensation;Therapeutic activities;Functional mobility training;Therapeutic exercise;Balance training;Gait training    PT Next Visit Plan MiniBest or other outcome measure, develop and provide HEP,  consider use of mirror to assist with  finding neutral             Patient will benefit from skilled therapeutic intervention in order to improve the following deficits and impairments:  Abnormal gait, Decreased activity tolerance, Decreased balance, Decreased coordination, Decreased endurance, Decreased mobility, Decreased range of motion, Decreased strength, Difficulty walking, Dizziness, Impaired perceived functional ability, Impaired vision/preception  Visit Diagnosis: Muscle weakness (generalized)  Other lack of coordination  Other abnormalities of gait and  mobility  Unsteadiness on feet  Low vision, both eyes  Vertebral artery dissection [facility])     Problem List Patient Active Problem List   Diagnosis Date Noted   Slow transit constipation    Morbid obesity (HCC)    Neck pain    ASD (atrial septal defect)    Ischemic cerebrovascular accident (CVA) of frontal lobe (HCC) 09/13/2020   Stroke (HCC) 09/10/2020   Lateral medullary syndrome    Vertebral artery dissection (HCC) 09/09/2020   Obesity (BMI 35.0-39.9 without comorbidity) 09/09/2020   Essential hypertension 09/09/2020   Acute stroke of medulla oblongata (HCC)    4:39 PM, 10/22/20 Rosamaria Lints, PT, DPT Physical Therapist - [facility]  Outpatient Physical Therapy- Main Campus [phone]     [place], PT 10/22/2020, 4:35 PM  [place] [facility] MAIN [facility] SERVICES [address] [place], [place], [address] Phone: [phone]   Fax:  [phone]  Name: SUKHDEEP WIETING MRN: [id number] Date of Birth: 1985-11-29

## 2020-10-23 ENCOUNTER — Encounter: Payer: Self-pay | Admitting: Physical Therapy

## 2020-10-23 ENCOUNTER — Ambulatory Visit: Payer: BC Managed Care – PPO | Admitting: Physical Therapy

## 2020-10-23 ENCOUNTER — Ambulatory Visit: Payer: BC Managed Care – PPO | Admitting: Occupational Therapy

## 2020-10-23 DIAGNOSIS — M6281 Muscle weakness (generalized): Secondary | ICD-10-CM

## 2020-10-23 DIAGNOSIS — R2689 Other abnormalities of gait and mobility: Secondary | ICD-10-CM | POA: Diagnosis not present

## 2020-10-23 DIAGNOSIS — I7774 Dissection of vertebral artery: Secondary | ICD-10-CM | POA: Diagnosis not present

## 2020-10-23 DIAGNOSIS — H543 Unqualified visual loss, both eyes: Secondary | ICD-10-CM

## 2020-10-23 DIAGNOSIS — R278 Other lack of coordination: Secondary | ICD-10-CM

## 2020-10-23 DIAGNOSIS — R2681 Unsteadiness on feet: Secondary | ICD-10-CM

## 2020-10-23 NOTE — Therapy (Signed)
[facility] MAIN [facility] SERVICES [address] [place], [place], [address] Phone: [phone]   Fax:  [phone]  Occupational Therapy Treatment  Patient Details  Name: Curtis [name] MRN: [id number] Date of Birth: 12-Nov-1985 Referring Provider (OT): [name]   Encounter Date: 10/23/2020   OT End of Session - 10/23/20 1614     Visit Number 5    Number of Visits 24    Date for OT Re-Evaluation 12/25/20    Authorization Time Period Progress report period starting 10/02/2020    OT Start Time 1150    OT Stop Time 1230    OT Time Calculation (min) 40 min    Activity Tolerance Patient tolerated treatment well    Behavior During Therapy [facility] for tasks assessed/performed             No past medical history on file.  No past surgical history on file.  There were no vitals filed for this visit.   Subjective Assessment - 10/23/20 1611     Subjective  "I feel like my vision has been worse over the last week."    Patient is accompanied by: Family member    Pertinent History Pt. is a 35 y.o. male who was diagnosed with Vertebral Artery Dissection, and Small Acute CVA. Pt. has Dizziness, nausea, diplopia, and fatigue. Pt.'s 1st child was born 3 months ago. Pt. 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    Currently in Pain? Yes    Pain Score 3     Pain Location Neck    Pain Orientation Left    Pain Descriptors / Indicators Aching    Pain Type Chronic pain    Pain Onset More than a month ago           OT TREATMENT    Neuro muscular re-education:  Pt. worked on grasping, and manipulating 1/2", 3/4", and 1" washers from a magnetic dish using point grasp pattern. Pt. worked on reaching up, stabilizing, and sustaining shoulder elevation while placing the washer over a small precise target on vertical dowels positioned at various angles.    Therapeutic Exercise:  Pt. performed gross gripping with a gross grip strengthener. Pt. worked  on sustaining grip while grasping pegs and reaching at various heights. The gripper was set to 23.4 # of grip strength resistance.   Self-care:  Reviewed visual compensatory strategies accessing items in the kitchen cabinetry, appliances, and transporting items throughout the kitchen. Pt. worked on visual scanning throughout [employer].  Patient reports being nauseous last night.  Pt. reports having had visual changes since last week. Pt. was able to demonstrate visual compensatory strategies for kitchen management tasks.  Pt. was able to  perform visual scanning in his extrapersonal space throughout the kitchen with 2 omissions in hard to locate areas. Pt. Is improving with grip strength, and fine motor coordination skills. Patient had difficulty reaching the furthest diagonal target consistently secondary to over and under reaching target.  Patient continues to work on improving bilateral hand coordination and grip strength hand function continues to benefit from education about compensatory strategies work, and simplification tasks during ADLs and IADL tasks.                            OT Education - 10/23/20 1613     Education Details Grip strength, FMC, visual compensatory strategies, visual scanning in the extrapersonal space.    Person(s) Educated Patient  Methods Explanation    Comprehension Verbalized understanding;Returned demonstration;Verbal cues required;Need further instruction              OT Short Term Goals - 10/02/20 1650       OT SHORT TERM GOAL #1   Title Pt. will improve FOTO score by 3 points for clinically relevant change during ADLs, and IADL tasks.    Baseline Eval: FOTO score 63 TR score 75    Time 12    Period Weeks    Status New    Target Date 11/13/20               OT Long Term Goals - 10/02/20 1652       OT LONG TERM GOAL #1   Title Pt. will improve UE strength in order to be able to hold, and feed his baby with  modified independence.    Baseline Eval: Pt. is unable to hold, and feed his baby.    Time 12    Period Weeks    Status New    Target Date 12/25/20      OT LONG TERM GOAL #2   Title Pt. will perform light homemaking tasks with modified indepedence in standing    Baseline EVal: Limited standing tolerance during home management tasks.    Time 12    Period Weeks    Status New    Target Date 12/25/20      OT LONG TERM GOAL #3   Title Pt. will independently demonstrate visual compensatory strategies 100% of the time during tabletop ADLs, and IADL tasks.    Baseline Eval: Pt. has difficulty    Time 12    Period Weeks    Status New    Target Date 12/25/20      OT LONG TERM GOAL #4   Title Pt. will demonstrate visual compensatory strategies 100% of the time while performing IADL tasks within his extrpersonal space.    Baseline Eval: Pt. has difficulty    Time 12    Period Weeks    Status New    Target Date 12/25/20                   Plan - 10/23/20 1614     Clinical Impression Statement Patient reports being nauseous last night.  Patient reports being nauseous last night.  Pt. reports having had visual changes since last week. Pt. was able to demonstrate visual compensatory strategies for kitchen management tasks.  Pt. was able to  perform visual scanning in his extrapersonal space throughout the kitchen with 2 omissions in hard to locate areas. Pt. Is improving with grip strength, and fine motor coordination skills. Patient had difficulty reaching the furthest diagonal target consistently secondary to over and under reaching target.  Patient continues to work on improving bilateral hand coordination and grip strength hand function continues to benefit from education about compensatory strategies work, and simplification tasks during ADLs and IADL tasks.     OT Occupational Profile and History Detailed Assessment- Review of Records and additional review of physical, cognitive,  psychosocial history related to current functional performance    Occupational performance deficits (Please refer to evaluation for details): IADL's;ADL's    Body Structure / Function / Physical Skills ADL;IADL    Rehab Potential Good    Clinical Decision Making Several treatment options, min-mod task modification necessary    Comorbidities Affecting Occupational Performance: May have comorbidities impacting occupational performance    Modification or Assistance to  Complete Evaluation  Min-Moderate modification of tasks or assist with assess necessary to complete eval    OT Frequency 3x / week    OT Duration 12 weeks    OT Treatment/Interventions Therapeutic exercise;DME and/or AE instruction;Patient/family education;Therapeutic activities;Self-care/ADL training    Consulted and Agree with Plan of Care Patient             Patient will benefit from skilled therapeutic intervention in order to improve the following deficits and impairments:   Body Structure / Function / Physical Skills: ADL, IADL       Visit Diagnosis: Muscle weakness (generalized)  Other lack of coordination    Problem List Patient Active Problem List   Diagnosis Date Noted   Slow transit constipation    Morbid obesity (HCC)    Neck pain    ASD (atrial septal defect)    Ischemic cerebrovascular accident (CVA) of frontal lobe (HCC) 09/13/2020   Stroke (HCC) 09/10/2020   Lateral medullary syndrome    Vertebral artery dissection (HCC) 09/09/2020   Obesity (BMI 35.0-39.9 without comorbidity) 09/09/2020   Essential hypertension 09/09/2020   Acute stroke of medulla oblongata (HCC)     Vernelle Emerald, OTR/L 10/23/2020, 4:35 PM  [place] MAIN [facility] SERVICES [address] [place], [place], [address] Phone: [phone]   Fax:  [phone]  Name: Curtis [name] MRN: [id number] Date of Birth: 16-Aug-1985

## 2020-10-23 NOTE — Therapy (Signed)
[facility] MAIN [facility] SERVICES [address] [place], [place], [address] Phone: [phone]   Fax:  [phone]  Occupational Therapy Treatment  Patient Details  Name: Curtis [name] MRN: [id number] Date of Birth: 1985-06-01 Referring Provider (OT): [name]   Encounter Date: 10/22/2020   OT End of Session - 10/23/20 0927     Visit Number 4    Number of Visits 24    Date for OT Re-Evaluation 12/25/20    Authorization Time Period Progress report period starting 10/02/2020    OT Start Time 1345    OT Stop Time 1430    OT Time Calculation (min) 45 min    Activity Tolerance Patient tolerated treatment well    Behavior During Therapy [facility] for tasks assessed/performed             No past medical history on file.  No past surgical history on file.  There were no vitals filed for this visit.   Subjective Assessment - 10/22/20 0922     Subjective  "I feel like my vision has been worse over the last week."    Patient is accompanied by: Family member    Pertinent History Pt. is a 35 y.o. male who was diagnosed with Vertebral Artery Dissection, and Small Acute CVA. Pt. has Dizziness, nausea, diplopia, and fatigue. Pt.'s 1st child was born 3 months ago. Pt. 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    Patient Stated Goals To be able to hold, and feed his daughter, and to be able to play golf again.    Currently in Pain? Yes    Pain Location Neck    Pain Orientation Left    Pain Descriptors / Indicators Aching    Pain Type Chronic pain    Pain Radiating Towards neck    Pain Onset More than a month ago    Pain Frequency Intermittent    Aggravating Factors  prolonged positioning    Pain Relieving Factors sleeping on back           Occupational Therapy Treatment: Neuro re-ed: Participation in LUE FMC/GMC activity placing therapy resistant clothespins onto horizontal and vertical bar and facilitation of visual scanning having pt  target 1" marks on yard stick.  Addressed visual scanning and saccadic movements with use of deck of cards, placing 10 cards in numerical order on table top; 3 trials with R eye covered, 3 trials with L eye covered, then 3 trials with binocular vision/teaming.  Instructed pt in "jumping" exercises for diplopia, choosing a near target then jumping to a distant target; instruction given for repeated blinking and closing eyes for rest when unable to achieve convergence.  Practiced paper/pencil scanning with larger print numbers; pt had to cover lines beneath his current row to reduce visual stimulation and tolerated scanning only 6 rows with rest breaks between each row.  Pt was to circle a given letter within a row and was inconsistent to circle directly around the letter, sometimes undershooting to the L, or overshooting to the R of the letter.   Response to Treatment: Pt reports his LUE strength and coordination have greatly improved.  He can now feed his daughter but states that holding her causes some fatigue in the LUE.  Pt required frequent rest breaks with all visual exercises d/t fatigue and some nausea.  Pt states he's been trying use his eye patch less and less, but that he does continue to alternate between L and R eyes  with patch.  Pt given intermittent min vc to minimize head turns while scanning to the R.  Pt noted to minimally tolerate sustained focus of objects in R visual field and stated that his double vision seems worse over the last week.  OT encouraged frequent but short periods of visual exercise at home for scanning and diplopia exercises.  Pt will continue to benefit from skilled OT to address oculomotor deficits and LUE incoordination in order to maximize indep with ADLs/IADLs/work/leisure.        OT Education - 10/22/20 0926     Education Details visual exercises for scanning and diplopia    Person(s) Educated Patient    Methods Explanation    Comprehension Verbalized  understanding;Returned demonstration;Verbal cues required;Need further instruction              OT Short Term Goals - 10/02/20 1650       OT SHORT TERM GOAL #1   Title Pt. will improve FOTO score by 3 points for clinically relevant change during ADLs, and IADL tasks.    Baseline Eval: FOTO score 63 TR score 75    Time 12    Period Weeks    Status New    Target Date 11/13/20               OT Long Term Goals - 10/02/20 1652       OT LONG TERM GOAL #1   Title Pt. will improve UE strength in order to be able to hold, and feed his baby with modified independence.    Baseline Eval: Pt. is unable to hold, and feed his baby.    Time 12    Period Weeks    Status New    Target Date 12/25/20      OT LONG TERM GOAL #2   Title Pt. will perform light homemaking tasks with modified indepedence in standing    Baseline EVal: Limited standing tolerance during home management tasks.    Time 12    Period Weeks    Status New    Target Date 12/25/20      OT LONG TERM GOAL #3   Title Pt. will independently demonstrate visual compensatory strategies 100% of the time during tabletop ADLs, and IADL tasks.    Baseline Eval: Pt. has difficulty    Time 12    Period Weeks    Status New    Target Date 12/25/20      OT LONG TERM GOAL #4   Title Pt. will demonstrate visual compensatory strategies 100% of the time while performing IADL tasks within his extrpersonal space.    Baseline Eval: Pt. has difficulty    Time 12    Period Weeks    Status New    Target Date 12/25/20                   Plan - 10/22/20 0956     Clinical Impression Statement Pt reports his LUE strength and coordination have greatly improved.  He can now feed his daughter but states that holding her causes some fatigue in the LUE.  Pt required frequent rest breaks with all visual exercises d/t fatigue and some nausea.  Pt states he's been trying use his eye patch less and less, but that he does continue to  alternate between L and R eyes with patch.  Pt given intermittent min vc to minimize head turns while scanning to the R.  Pt noted to minimally tolerate sustained  focus of objects in R visual field and stated that his double vision seems worse over the last week.  OT encouraged frequent but short periods of visual exercise at home for scanning and diplopia exercises.  Pt will continue to benefit from skilled OT to address oculomotor deficits and LUE incoordination in order to maximize indep with ADLs/IADLs/work/leisure.    OT Occupational Profile and History Detailed Assessment- Review of Records and additional review of physical, cognitive, psychosocial history related to current functional performance    Occupational performance deficits (Please refer to evaluation for details): IADL's;ADL's    Body Structure / Function / Physical Skills ADL;IADL    Rehab Potential Good    Clinical Decision Making Several treatment options, min-mod task modification necessary    Comorbidities Affecting Occupational Performance: May have comorbidities impacting occupational performance    Modification or Assistance to Complete Evaluation  Min-Moderate modification of tasks or assist with assess necessary to complete eval    OT Frequency 3x / week    OT Duration 12 weeks    OT Treatment/Interventions Therapeutic exercise;DME and/or AE instruction;Patient/family education;Therapeutic activities;Self-care/ADL training    Consulted and Agree with Plan of Care Patient             Patient will benefit from skilled therapeutic intervention in order to improve the following deficits and impairments:   Body Structure / Function / Physical Skills: ADL, IADL       Visit Diagnosis: Muscle weakness (generalized)  Other lack of coordination  Low vision, both eyes    Problem List Patient Active Problem List   Diagnosis Date Noted   Slow transit constipation    Morbid obesity (HCC)    Neck pain    ASD  (atrial septal defect)    Ischemic cerebrovascular accident (CVA) of frontal lobe (HCC) 09/13/2020   Stroke (HCC) 09/10/2020   Lateral medullary syndrome    Vertebral artery dissection (HCC) 09/09/2020   Obesity (BMI 35.0-39.9 without comorbidity) 09/09/2020   Essential hypertension 09/09/2020   Acute stroke of medulla oblongata (HCC)    Danelle Earthly, MS, OTR/L  Otis Dials, OT/L 10/23/2020, 9:56 AM  [place] [facility] MAIN [facility] SERVICES [address] [place], [place], [address] Phone: [phone]   Fax:  [phone]  Name: Curtis [name] MRN: [id number] Date of Birth: 07/06/1985

## 2020-10-23 NOTE — Therapy (Signed)
[place] [facility] [facility] MAIN [facility] SERVICES [address] [place], [place], [address] Phone: [phone]   Fax:  [phone]  Physical Therapy Treatment  Patient Details  Name: Curtis [name] MRN: [id number] Date of Birth: [birth year] Referring Provider (PT): Alfredo Batty   Encounter Date: 10/23/2020   PT End of Session - 10/23/20 1110     Visit Number 4    Number of Visits 24    Date for PT Re-Evaluation 12/25/20    Authorization Type [employer], no auth req    Authorization Time Period Cert 05/31/41-32/95/18    Progress Note Due on Visit 10    PT Start Time 1103    PT Stop Time 1145    PT Time Calculation (min) 42 min    Equipment Utilized During Treatment Gait belt    Activity Tolerance Patient tolerated treatment well    Behavior During Therapy [facility] for tasks assessed/performed             History reviewed. No pertinent past medical history.  History reviewed. No pertinent surgical history.  There were no vitals filed for this visit.   Subjective Assessment - 10/23/20 1108     Subjective Patient reports being a little dizzy/nausea yesterday stating that it was a little much yesterday. He reports increased fatigue and sore from yesterday. He reports, "I am okay doing exercise today but just take it easy" He reports some numbness on right thumb, states he can move around good but he isn't sure if he just over did it yesterday;    Pertinent History Curtis [name] is a 35 y.o. M with obesity no other significant PMHx presented to hospital  with acute onset nausea, dizziness. Patient had recently seen a chiropractor and being treated with cervical manipulation.  On the morning of admission he developed sudden dizziness, nausea, neck pain, and headache and so he came to the ER.    CT angiogram was obtained that showed findings consistent with dissection of the left vertebral artery.  MRI brain showed small ischemic infarct in the left lateral medulla.  Pt had stay at inpatient rehab and has made significant progress in his funciton since discharge. Pt also has 3 m.o. daughter he would like to be able to have a larger part in caring for but is restricted due to his impairments. at [facility].E. patient presented to therapy ambulating with rollator.    Currently in Pain? Yes    Pain Score 5     Pain Location Neck    Pain Orientation Left    Pain Descriptors / Indicators Aching    Pain Type Chronic pain    Pain Onset More than a month ago    Pain Frequency Intermittent    Aggravating Factors  prolonged positioning    Pain Relieving Factors sitting supported, laying on back    Effect of Pain on Daily Activities decreased activity tolerance;    Multiple Pain Sites No                [facility] PT Assessment - 10/23/20 0001       ROM / Strength   AROM / PROM / Strength Strength      Strength   Strength Assessment Site Shoulder    Right/Left Shoulder Right;Left    Right Shoulder Flexion 4/5    Right Shoulder Extension 4/5    Right Shoulder Internal Rotation 3/5    Right Shoulder External Rotation 4/5    Left Shoulder Flexion 4-/5  Left Shoulder Extension 3+/5    Left Shoulder ABduction 3/5    Left Shoulder Internal Rotation 3+/5    Left Shoulder External Rotation 3+/5           TREATMENT: PT assessed UE strength see above; Patient reports increased pain in RUE with shoulder IR but ROM is functional Tests (+) with hawkin's kennedy on RUE, (-) LUE (-) Neers test bilaterally Concerned for possible impingement in RUE  Strength:  Scapular: Mid trap: 3/5  Pain reported during MMT on RUE Rhomboid 3+/5     Patient prone: BUE shoulder extension 2# x10 reps with decreased motor control noted on LUE requiring min VCs for core stabilizatoin for better trunk control and to improve UE positioning;  BUE shoulder low row 2# x10 reps BUE shoulder mid row 2# x10 reps; Patient exhibits increased instability in LUE with increased  repetitions requiring cues for proper motor control and positioning;   Sidelying: RUE shoulder ER 2#, 2x15 reps; RUE shoulder abduction 2# x5 reps with cues to slow down UE movement to isolate shoulder abduction and work on motor control, increased shoulder crepitis noted; no pain reported  LUE shoulder ER 2# x15 reps LUE shoulder abduction, partial ROM with cues to slow down UE movement to isolate motor control 2x5 reps; Patient does exhibit increased crepitis and inferior instability with shoulder abduction > 90 degrees; Recommend patient only lift UE to <90 to work on better motor control and reduce strain to UE  Patient provided with written HEP for UE strengthening, see patient instructions. He verbalized and demonstrated understanding  Patient denies any increase in discomfort with advanced exercise. He would benefit from additional skilled PT Intervention to improve motor control and strength;                    PT Education - 10/23/20 1110     Education Details shoulder assessment/strength;    Person(s) Educated Patient    Methods Explanation;Verbal cues    Comprehension Verbalized understanding;Returned demonstration;Verbal cues required;Need further instruction              PT Short Term Goals - 10/02/20 1721       PT SHORT TERM GOAL #1   Title Patient will be independent in home exercise program to improve strength/mobility for better functional independence with ADLs.    Baseline Pt has HEP from hospital discharge but it is not progressive    Time 4    Period Weeks    Status New    Target Date 10/30/20      PT SHORT TERM GOAL #2   Title Patient (< 52 years old) will complete five times sit to stand test in < 15 seconds indicating an increased LE strength and improved balance.    Baseline >20 sec    Time 4    Period Weeks    Target Date 10/30/20      PT SHORT TERM GOAL #3   Title Patient will increase six minute walk test distance to >1200 with  LRAD for progression to community ambulator and improve gait ability    Baseline 1015 feet with rollator    Time 4    Period Weeks    Status New               PT Long Term Goals - 10/02/20 1724       PT LONG TERM GOAL #1   Title Patient will increase FOTO score to equal to or greater  than 15    to demonstrate statistically significant improvement in mobility and quality of life.    Baseline 63.5    Time 12    Period Weeks    Status New    Target Date 12/25/20      PT LONG TERM GOAL #2   Title Patient will increase Functional Gait Assessment score to >25/30 as to reduce fall risk and improve dynamic gait safety with community ambulation.    Baseline 20    Time 12    Period Weeks    Status New    Target Date 12/25/20      PT LONG TERM GOAL #3   Title Patient will ascend/descend 4 stairs without rail assist independently without loss of balance to improve ability to get in/out of home.    Baseline requires use of handrails to safely naigate stairs    Time 12    Period Weeks    Status New      PT LONG TERM GOAL #4   Title Patient will increase six minute walk test distance to >1300 without AD for progression to community ambulator and improve gait ability    Baseline 1015 with rollator    Time 12    Period Weeks    Target Date 12/25/20                   Plan - 10/23/20 1311     Clinical Impression Statement Patien reports feeling increased dizziness and nausea after last session. He reports feeling increased LUE proximal shoulder weakness and scapular weakness having difficulty holding 65 month old child. PT reached out to OT who confirmed they are working more on cognition and distal UE coordination; PT assessed UE shoulder strength. He does exhibit weakness in BUE. He also exhibits crepitis with shoulder instability noted in each UE (L>R). He has a significant inferior glide/instability in left shoulder after 90 degrees abduction/flexion; Patient reports  increased pain in RUE with shoulder IR, testing positive for possible impingement. He was instructed in advanced shoulder strengthening exercise, requiring min VCs for proper positioning. He does exhibit decreased motor control on LUE with increased repetition with increased instability. He was provided with written HEP for strengthening. He would benefit from additional skilled PT Intervention to improve strength, balance and mobility;    Personal Factors and Comorbidities Comorbidity 1;Comorbidity 2    Comorbidities obesity, HTN    Examination-Activity Limitations Caring for Others;Carry;Lift;Locomotion Level;Squat;Stairs;Stand    Examination-Participation Restrictions Driving;Occupation;Yard Work    Conservation officer, historic buildings Evolving/Moderate complexity    Rehab Potential Excellent    PT Frequency 2x / week    PT Duration 12 weeks    PT Treatment/Interventions ADLs/Self Care Home Management;Neuromuscular re-education;Patient/family education;Manual techniques;Passive range of motion;Dry needling;Energy conservation;Vestibular;Visual/perceptual remediation/compensation;Therapeutic activities;Functional mobility training;Therapeutic exercise;Balance training;Gait training    PT Next Visit Plan MiniBest or other outcome measure, develop and provide HEP,  consider use of mirror to assist with  finding neutral             Patient will benefit from skilled therapeutic intervention in order to improve the following deficits and impairments:  Abnormal gait, Decreased activity tolerance, Decreased balance, Decreased coordination, Decreased endurance, Decreased mobility, Decreased range of motion, Decreased strength, Difficulty walking, Dizziness, Impaired perceived functional ability, Impaired vision/preception  Visit Diagnosis: Muscle weakness (generalized)  Other lack of coordination  Other abnormalities of gait and mobility  Unsteadiness on feet  Low vision, both  eyes     Problem List Patient  Active Problem List   Diagnosis Date Noted   Slow transit constipation    Morbid obesity (HCC)    Neck pain    ASD (atrial septal defect)    Ischemic cerebrovascular accident (CVA) of frontal lobe (HCC) 09/13/2020   Stroke (HCC) 09/10/2020   Lateral medullary syndrome    Vertebral artery dissection (HCC) 09/09/2020   Obesity (BMI 35.0-39.9 without comorbidity) 09/09/2020   Essential hypertension 09/09/2020   Acute stroke of medulla oblongata (HCC)     Curtis [name], PT, DPT 10/23/2020, 1:19 PM  [facility] MAIN [facility] SERVICES [address] [place], [place], [address] Phone: [phone]   Fax:  [phone]  Name: Curtis [name] MRN: [id number] Date of Birth: 1985/04/15

## 2020-10-23 NOTE — Patient Instructions (Signed)
Access Code: [email or web address] URL: [email or web address] Date: 10/23/2020 Prepared by: Zettie Pho  Exercises Shoulder Extension with Resistance - 1 x daily - 4 x weekly - 2 sets - 10 reps Standing Bilateral Low Shoulder Row with Anchored Resistance - 1 x daily - 4 x weekly - 2 sets - 10 reps Seated High Shoulder Row with Anchored Resistance - 1 x daily - 4 x weekly - 2 sets - 10 reps Seated Shoulder External Rotation with Resistance - 1 x daily - 4 x weekly - 2 sets - 10 reps

## 2020-10-25 ENCOUNTER — Encounter: Payer: Self-pay | Admitting: Physical Medicine and Rehabilitation

## 2020-10-25 ENCOUNTER — Encounter
Payer: BC Managed Care – PPO | Attending: Physical Medicine and Rehabilitation | Admitting: Physical Medicine and Rehabilitation

## 2020-10-25 ENCOUNTER — Other Ambulatory Visit: Payer: Self-pay

## 2020-10-25 DIAGNOSIS — I7774 Dissection of vertebral artery: Secondary | ICD-10-CM | POA: Diagnosis not present

## 2020-10-25 DIAGNOSIS — I639 Cerebral infarction, unspecified: Secondary | ICD-10-CM

## 2020-10-25 DIAGNOSIS — M542 Cervicalgia: Secondary | ICD-10-CM

## 2020-10-25 DIAGNOSIS — H532 Diplopia: Secondary | ICD-10-CM

## 2020-10-25 DIAGNOSIS — R200 Anesthesia of skin: Secondary | ICD-10-CM

## 2020-10-25 DIAGNOSIS — G464 Cerebellar stroke syndrome: Secondary | ICD-10-CM | POA: Diagnosis not present

## 2020-10-25 DIAGNOSIS — R202 Paresthesia of skin: Secondary | ICD-10-CM

## 2020-10-25 MED ORDER — FLUOXETINE HCL 20 MG PO CAPS: 20.0000 mg | ORAL_CAPSULE | Freq: Every day | ORAL | 3 refills | Status: DC

## 2020-10-25 NOTE — Progress Notes (Addendum)
   Subjective:    Patient ID: Curtis [name], male    DOB: 1985-05-31, 35 y.o.   MRN: [id number]  HPI An audio/video tele-health visit is felt to be the most appropriate encounter for this patient at this time. This is a follow up tele-visit via phone. The patient is at home. MD is at office.    Curtis [name] is a 35 year old man who presents for hospital follow-up after CIR admission for ischemic CVA of the frontal lobe. CVA occurred after chiropractic manipulation for neck pain. CTA showed dissection of left vertebral artery. MRI brain showed small left lateral medullary infarct.   Medical problems: ischemic CVA of the frontal lobe, neck pain, lateral medullary syndrome, vertebral artery dissection, slow transit constipation, morbid obesity, neck pain, essential hypertension, active smoker  Smoking history: smokes cigars some days  Social history: first child was born three months ago. 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 Enjoys golfing.   1) Ischemic CVA of the frontal lobe -has had 5 OT visits -feels vision has been worsening +dizziness, nausea, diplopia, and fatigue -strength in both upper extremities has been affected L>R  2) Neck pain -3-5/10 pain in left side of neck, aching, that started more than a month ago.   3) Bilateral upper extremity weakness: -worse on left side     Review of Systems +dizziness, nausea, diplopia, and fatigue, +numbness in right thumb     Objective:   Physical Exam Not performed as patient was seen via phone       Assessment & Plan:  1) Impaired mobility and ADLs s/p ischemic frontal CVA -continue therapies, therapy notes reviewed.  -would benefit from handicap placard to increase her mobility in the community -provided dietary and exercise counseling -discussed that hypertension is number one reversible risk factor for stroke.   -given worsening vision, recommend f/u with neuro-ophthalmology- provided referral -Repeat MRI brain given dipolopia  that is worsening  2)Chronic Pain Syndrome secondary to neck pain -Discussed current symptoms of pain and history of pain.  -recommended applying voltaren gel -MRI ordered of cervical spine given numbness and tingling in both hands. -continue robaxin PRN   4) Bilateral upper extremity weakness L>R -Discussed Sprint PNS system as an option of pain treatment via neuromodulation. Provided following link for patient to learn more about the system: https://www.sprtherapeutics.com/. Explained mechanism of activating A beta fibers which function in touch, pressure, and vibration, to inhibit the sensations of A delta and C fibers which are responsible for pain transmission.     33 min spent in discussion of diplopia, vertigo, his chronic neck pain, recommended applying voltaren gel or blue emu oil for myofasical pain, discussed bilateral upper extremity weakness and SPRINT PNS, obtaining MRI of brain and neck given his worsening dipolpia as well as his bilateral hand numbness, referral to neuroophthalmology for evaluation of diplopia.

## 2020-10-29 ENCOUNTER — Other Ambulatory Visit: Payer: Self-pay | Admitting: Physical Medicine and Rehabilitation

## 2020-10-29 ENCOUNTER — Other Ambulatory Visit: Payer: Self-pay

## 2020-10-29 ENCOUNTER — Ambulatory Visit: Payer: BC Managed Care – PPO

## 2020-10-29 ENCOUNTER — Ambulatory Visit: Payer: BC Managed Care – PPO | Attending: Family Medicine

## 2020-10-29 DIAGNOSIS — R262 Difficulty in walking, not elsewhere classified: Secondary | ICD-10-CM | POA: Insufficient documentation

## 2020-10-29 DIAGNOSIS — I7774 Dissection of vertebral artery: Secondary | ICD-10-CM | POA: Insufficient documentation

## 2020-10-29 DIAGNOSIS — R202 Paresthesia of skin: Secondary | ICD-10-CM

## 2020-10-29 DIAGNOSIS — R2681 Unsteadiness on feet: Secondary | ICD-10-CM | POA: Diagnosis not present

## 2020-10-29 DIAGNOSIS — R2689 Other abnormalities of gait and mobility: Secondary | ICD-10-CM | POA: Insufficient documentation

## 2020-10-29 DIAGNOSIS — H543 Unqualified visual loss, both eyes: Secondary | ICD-10-CM | POA: Diagnosis not present

## 2020-10-29 DIAGNOSIS — R269 Unspecified abnormalities of gait and mobility: Secondary | ICD-10-CM | POA: Diagnosis not present

## 2020-10-29 DIAGNOSIS — M6281 Muscle weakness (generalized): Secondary | ICD-10-CM | POA: Diagnosis not present

## 2020-10-29 DIAGNOSIS — R278 Other lack of coordination: Secondary | ICD-10-CM | POA: Insufficient documentation

## 2020-10-29 DIAGNOSIS — R200 Anesthesia of skin: Secondary | ICD-10-CM

## 2020-10-29 NOTE — Therapy (Signed)
[facility] MAIN [facility] SERVICES [address] [place], [place], [address] Phone: [phone]   Fax:  [phone]  Physical Therapy Treatment  Patient Details  Name: Curtis [name] MRN: [id number] Date of Birth: 05-22-1985 Referring Provider (PT): Curtis [name]   Encounter Date: 10/29/2020   PT End of Session - 10/29/20 1644     Visit Number 5    Number of Visits 24    Date for PT Re-Evaluation 12/25/20    Authorization Type [employer], no auth req    Authorization Time Period Cert 10/02/65-34/19/37    Progress Note Due on Visit 10    PT Start Time 1516    PT Stop Time 1603    PT Time Calculation (min) 47 min    Equipment Utilized During Treatment Gait belt    Activity Tolerance Patient tolerated treatment well    Behavior During Therapy [facility] for tasks assessed/performed             History reviewed. No pertinent past medical history.  History reviewed. No pertinent surgical history.  There were no vitals filed for this visit.   Subjective Assessment - 10/29/20 1631     Subjective Patient reports feeling some better since last visit with decreased overall pain. He reports his video visit with MD on Friday mentioned possible having PT assess his ongoing symptoms of vertigo.    Pertinent History Curtis [name] is a 35 y.o. M with obesity no other significant PMHx presented to hospital  with acute onset nausea, dizziness. Patient had recently seen a chiropractor and being treated with cervical manipulation.  On the morning of admission he developed sudden dizziness, nausea, neck pain, and headache and so he came to the ER.    CT angiogram was obtained that showed findings consistent with dissection of the left vertebral artery.  MRI brain showed small ischemic infarct in the left lateral medulla. Pt had stay at inpatient rehab and has made significant progress in his funciton since discharge. Pt also has 3 m.o. daughter he would like to be  able to have a larger part in caring for but is restricted due to his impairments. at I.E. patient presented to therapy ambulating with rollator.    Currently in Pain? Yes    Pain Score 2     Pain Location Neck    Pain Orientation Left    Pain Descriptors / Indicators Aching    Pain Type Chronic pain    Pain Onset More than a month ago    Pain Frequency Intermittent    Aggravating Factors  prolonged unsupported sitting/positioning    Pain Relieving Factors sitting supported    Effect of Pain on Daily Activities Decreased activity tolerance.    Multiple Pain Sites No                 Interventions:   Brief Vestibular screen performed by Curtis [name], PT Oculomotor testing: smooth pursuits: abnormal, saccadic.     Saccades: abnormal to R.  Therapeutic Exercises:   Patient instructed in postural strengthening using blue theraband x 10-12 reps BUE today:   Shoulder Ext (standing) - Cues for slow/controlled movement Standing scap rows- Cues to hold for 2-3 sec Standing Low trap at wall Shoulder Ext Rot Shoulder horizontal ABD Patient denied any pain during today's session- Performing 10-12 reps of each exercise.   Wall posture stretch - with gentle chin retraction (painfree) x 60 sec hold. (VC and visual demo for correct technique)  Access Code: [email or web address] URL: [email or web address] Date: 10/29/2020 Prepared by: Curtis [name]  Exercises Shoulder Extension with Resistance - 1 x daily - 3 x weekly - 3 sets - 10 reps - 2-3 hold Standing Row with Anchored Resistance - 1 x daily - 3 x weekly - 3 sets - 10 reps - 2-3 hold Low Trap Setting at Wall - 1 x daily - 3 x weekly - 3 sets - 10 reps - 2-3 hold Shoulder External Rotation and Scapular Retraction with Resistance - 1 x daily - 3 x weekly - 3 sets - 10 reps Seated Shoulder Horizontal Abduction with Resistance - 1 x daily - 3 x weekly - 3 sets - 10 reps - 2-3 hold Correct Standing Posture - 1 x daily - 3  x weekly - 3 sets - 60 hold   Clinical Impression: Patient performed well with all postural strengthening with no worsening of any pain and no numbness/tingling. He was briefly screened for vestibular dysfunction. Will contact Neurologist to see if any precautions regarding cervical spine to see if a more comprehensive vestibular assessment appropriate. He would benefit from additional skilled PT Intervention to improve strength, balance and mobility                    PT Education - 10/29/20 1638     Education Details Vestibular ed; postural education    Person(s) Educated Patient    Methods Explanation;Demonstration;Tactile cues;Verbal cues    Comprehension Verbalized understanding;Returned demonstration;Verbal cues required;Need further instruction;Tactile cues required              PT Short Term Goals - 10/02/20 1721       PT SHORT TERM GOAL #1   Title Patient will be independent in home exercise program to improve strength/mobility for better functional independence with ADLs.    Baseline Pt has HEP from hospital discharge but it is not progressive    Time 4    Period Weeks    Status New    Target Date 10/30/20      PT SHORT TERM GOAL #2   Title Patient (< 33 years old) will complete five times sit to stand test in < 15 seconds indicating an increased LE strength and improved balance.    Baseline >20 sec    Time 4    Period Weeks    Target Date 10/30/20      PT SHORT TERM GOAL #3   Title Patient will increase six minute walk test distance to >1200 with LRAD for progression to community ambulator and improve gait ability    Baseline 1015 feet with rollator    Time 4    Period Weeks    Status New               PT Long Term Goals - 10/02/20 1724       PT LONG TERM GOAL #1   Title Patient will increase FOTO score to equal to or greater than 15    to demonstrate statistically significant improvement in mobility and quality of life.    Baseline  63.5    Time 12    Period Weeks    Status New    Target Date 12/25/20      PT LONG TERM GOAL #2   Title Patient will increase Functional Gait Assessment score to >25/30 as to reduce fall risk and improve dynamic gait safety with community ambulation.    Baseline 20    Time 12  Period Weeks    Status New    Target Date 12/25/20      PT LONG TERM GOAL #3   Title Patient will ascend/descend 4 stairs without rail assist independently without loss of balance to improve ability to get in/out of home.    Baseline requires use of handrails to safely naigate stairs    Time 12    Period Weeks    Status New      PT LONG TERM GOAL #4   Title Patient will increase six minute walk test distance to >1300 without AD for progression to community ambulator and improve gait ability    Baseline 1015 with rollator    Time 12    Period Weeks    Target Date 12/25/20                   Plan - 10/29/20 1645     Clinical Impression Statement Patient performed well with all postural strengthening with no worsening of any pain and no numbness/tingling. He was briefly screened for vestibular dysfunction. Will contact Neurologist to see if any precautions regarding cervical spine to see if a more comprehensive vestibular assessment appropriate. He would benefit from additional skilled PT Intervention to improve strength, balance and mobility    Personal Factors and Comorbidities Comorbidity 1;Comorbidity 2    Comorbidities obesity, HTN    Examination-Activity Limitations Caring for Others;Carry;Lift;Locomotion Level;Squat;Stairs;Stand    Examination-Participation Restrictions Driving;Occupation;Yard Work    Conservation officer, historic buildings Evolving/Moderate complexity    Rehab Potential Excellent    PT Frequency 2x / week    PT Duration 12 weeks    PT Treatment/Interventions ADLs/Self Care Home Management;Neuromuscular re-education;Patient/family education;Manual techniques;Passive range of  motion;Dry needling;Energy conservation;Vestibular;Visual/perceptual remediation/compensation;Therapeutic activities;Functional mobility training;Therapeutic exercise;Balance training;Gait training    PT Next Visit Plan MiniBest or other outcome measure, develop and provide HEP,  consider use of mirror to assist with  finding neutral    PT Home Exercise Plan Access Code: [email or web address]             Patient will benefit from skilled therapeutic intervention in order to improve the following deficits and impairments:  Abnormal gait, Decreased activity tolerance, Decreased balance, Decreased coordination, Decreased endurance, Decreased mobility, Decreased range of motion, Decreased strength, Difficulty walking, Dizziness, Impaired perceived functional ability, Impaired vision/preception  Visit Diagnosis: Abnormality of gait and mobility  Difficulty in walking, not elsewhere classified  Muscle weakness (generalized)  Unsteadiness on feet     Problem List Patient Active Problem List   Diagnosis Date Noted   Slow transit constipation    Morbid obesity (HCC)    Neck pain    ASD (atrial septal defect)    Ischemic cerebrovascular accident (CVA) of frontal lobe (HCC) 09/13/2020   Stroke (HCC) 09/10/2020   Lateral medullary syndrome    Vertebral artery dissection (HCC) 09/09/2020   Obesity (BMI 35.0-39.9 without comorbidity) 09/09/2020   Essential hypertension 09/09/2020   Acute stroke of medulla oblongata (HCC)     Lenda Kelp, PT 10/29/2020, 4:57 PM  [facility] MAIN [facility] SERVICES [address] [place], [place], [address] Phone: [phone]   Fax:  [phone]  Name: Curtis [name] MRN: [id number] Date of Birth: 02-03-1985

## 2020-10-30 NOTE — Therapy (Signed)
[facility] MAIN [facility] SERVICES [address] [place], [place], [address] Phone: [phone]   Fax:  [phone]  Occupational Therapy Treatment  Patient Details  Name: Curtis [name] MRN: [id number] Date of Birth: Jun 04, 1985 Referring Provider (OT): [name]   Encounter Date: 10/29/2020   OT End of Session - 10/30/20 1042     Visit Number 6    Number of Visits 24    Date for OT Re-Evaluation 12/25/20    Authorization Time Period Progress report period starting 10/02/2020    OT Start Time 1600    OT Stop Time 1645    OT Time Calculation (min) 45 min    Activity Tolerance Patient tolerated treatment well    Behavior During Therapy [facility] for tasks assessed/performed             No past medical history on file.  No past surgical history on file.  There were no vitals filed for this visit.   Subjective Assessment - 10/29/20 1040     Subjective  "They're going to do a repeat MRI because of my worsening double vision, but it has been a lot better since last week"    Patient is accompanied by: Family member    Pertinent History Pt. is a 35 y.o. male who was diagnosed with Vertebral Artery Dissection, and Small Acute CVA. Pt. has Dizziness, nausea, diplopia, and fatigue. Pt.'s 1st child was born 3 months ago. Pt. 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    Patient Stated Goals To be able to hold, and feed his daughter, and to be able to play golf again.    Pain Onset More than a month ago           Occupational Therapy Treatment: Neuro re-ed: Participated in typing activity to facilitate sustained focus of both eyes in R visual field placing paragraph to copy to the R and chin in midline.  Able to complete with min vc to minimize head turns; occasional pauses for increased blinking to rest eyes, but overall better tolerance to tracking in R visual field with fewer eye shifts back to middle.  Focus on saccadic movements with pt  having to organize numbers 1-10 in first column, letters a-j in a second column on table top with goal to scan L/R with minimal head turns.  Once organized, pt was prompted to shift eyes back and forth between letters and numbers, starting with slow speed and increasing as tolerated.  Faster speed caused additional eye shifts, so pt was cued to reduce speed to maintain good eye focus.  OT provided intermittent adjustment to widen 2 columns to compensate for double vision.  Issued green theraputty and handout and instructed in exercises for gripping/pinching/intrinsic hand strengthening with good return demo.  Encouraged pt to complete 2x daily, 5-10 min per session for improving L hand strength and dexterity.    Response to Treatment: Pt saw Dr. Carlis Abbott yesterday and reported a positive visit.  Dr. Carlis Abbott provided prescription for Voltaren gel which is significantly helping to improve pt's neck pain.  Repeat MRI was also ordered d/t pt's report of worsening diplopia, but MRI is not yet scheduled as of OT session.  Pt does report improvement in diplopia since last week and reported no headache or nausea today.  Pt had fewer eye shifts when scanning to the R visual field today, and tolerated session well with frequent rest breaks.  Pt reports standing tolerance is no longer an issue  and pt was able to go to the outlets with spouse recently for 2-3 hours with good standing tolerance.  Will continue to progress pt's tolerance for smooth pursuits, saccadic movements, and HEP progression to reduce diplopia and visual fatigue which all impact ADL performance.     OT Education - 10/29/20 1041     Education Details HEP for diplopia, saccades, and smooth pursuits    Person(s) Educated Patient    Methods Explanation    Comprehension Verbalized understanding;Returned demonstration;Verbal cues required;Need further instruction              OT Short Term Goals - 10/02/20 1650       OT SHORT TERM GOAL #1    Title Pt. will improve FOTO score by 3 points for clinically relevant change during ADLs, and IADL tasks.    Baseline Eval: FOTO score 63 TR score 75    Time 12    Period Weeks    Status New    Target Date 11/13/20               OT Long Term Goals - 10/02/20 1652       OT LONG TERM GOAL #1   Title Pt. will improve UE strength in order to be able to hold, and feed his baby with modified independence.    Baseline Eval: Pt. is unable to hold, and feed his baby.    Time 12    Period Weeks    Status New    Target Date 12/25/20      OT LONG TERM GOAL #2   Title Pt. will perform light homemaking tasks with modified indepedence in standing    Baseline EVal: Limited standing tolerance during home management tasks.    Time 12    Period Weeks    Status New    Target Date 12/25/20      OT LONG TERM GOAL #3   Title Pt. will independently demonstrate visual compensatory strategies 100% of the time during tabletop ADLs, and IADL tasks.    Baseline Eval: Pt. has difficulty    Time 12    Period Weeks    Status New    Target Date 12/25/20      OT LONG TERM GOAL #4   Title Pt. will demonstrate visual compensatory strategies 100% of the time while performing IADL tasks within his extrpersonal space.    Baseline Eval: Pt. has difficulty    Time 12    Period Weeks    Status New    Target Date 12/25/20                   Plan - 10/29/20 1057     Clinical Impression Statement Pt saw Dr. Carlis Abbott yesterday and reported a positive visit.  Dr. Carlis Abbott provided prescription for Voltaren gel which is significantly helping to improve pt's neck pain.  Repeat MRI was also ordered d/t pt's report of worsening diplopia, but MRI is not yet scheduled as of OT session.  Pt does report improvement in diplopia since last week and reported no headache or nausea today.  Pt had fewer eye shifts when scanning to the R visual field today, and tolerated session well with frequent rest breaks.  Pt  reports standing tolerance is no longer an issue and pt was able to go to the outlets with spouse recently for 2-3 hours with good standing tolerance.  Will continue to progress pt's tolerance for smooth pursuits, saccadic movements, and HEP progression  to reduce diplopia and visual fatigue which all impact ADL performance.    OT Occupational Profile and History Detailed Assessment- Review of Records and additional review of physical, cognitive, psychosocial history related to current functional performance    Occupational performance deficits (Please refer to evaluation for details): IADL's;ADL's    Body Structure / Function / Physical Skills ADL;IADL    Rehab Potential Good    Clinical Decision Making Several treatment options, min-mod task modification necessary    Comorbidities Affecting Occupational Performance: May have comorbidities impacting occupational performance    Modification or Assistance to Complete Evaluation  Min-Moderate modification of tasks or assist with assess necessary to complete eval    OT Frequency 3x / week    OT Duration 12 weeks    OT Treatment/Interventions Therapeutic exercise;DME and/or AE instruction;Patient/family education;Therapeutic activities;Self-care/ADL training    Consulted and Agree with Plan of Care Patient             Patient will benefit from skilled therapeutic intervention in order to improve the following deficits and impairments:   Body Structure / Function / Physical Skills: ADL, IADL       Visit Diagnosis: Muscle weakness (generalized)  Low vision, both eyes  Vertebral artery dissection [facility])    Problem List Patient Active Problem List   Diagnosis Date Noted   Slow transit constipation    Morbid obesity (HCC)    Neck pain    ASD (atrial septal defect)    Ischemic cerebrovascular accident (CVA) of frontal lobe (HCC) 09/13/2020   Stroke (HCC) 09/10/2020   Lateral medullary syndrome    Vertebral artery dissection (HCC)  09/09/2020   Obesity (BMI 35.0-39.9 without comorbidity) 09/09/2020   Essential hypertension 09/09/2020   Acute stroke of medulla oblongata (HCC)    Danelle Earthly, MS, OTR/L  Otis Dials, OT/L 10/30/2020, 10:57 AM  [facility] MAIN [facility] SERVICES [address] [place], [place], [address] Phone: [phone]   Fax:  [phone]  Name: KRISHAV MAMONE MRN: [id number] Date of Birth: [birth year]

## 2020-10-31 ENCOUNTER — Ambulatory Visit: Payer: BC Managed Care – PPO

## 2020-10-31 ENCOUNTER — Ambulatory Visit: Payer: BC Managed Care – PPO | Admitting: Occupational Therapy

## 2020-10-31 ENCOUNTER — Other Ambulatory Visit: Payer: Self-pay

## 2020-10-31 DIAGNOSIS — R278 Other lack of coordination: Secondary | ICD-10-CM | POA: Diagnosis not present

## 2020-10-31 DIAGNOSIS — I7774 Dissection of vertebral artery: Secondary | ICD-10-CM | POA: Diagnosis not present

## 2020-10-31 DIAGNOSIS — R2681 Unsteadiness on feet: Secondary | ICD-10-CM

## 2020-10-31 DIAGNOSIS — M6281 Muscle weakness (generalized): Secondary | ICD-10-CM

## 2020-10-31 DIAGNOSIS — R2689 Other abnormalities of gait and mobility: Secondary | ICD-10-CM | POA: Diagnosis not present

## 2020-10-31 DIAGNOSIS — R262 Difficulty in walking, not elsewhere classified: Secondary | ICD-10-CM | POA: Diagnosis not present

## 2020-10-31 DIAGNOSIS — H543 Unqualified visual loss, both eyes: Secondary | ICD-10-CM | POA: Diagnosis not present

## 2020-10-31 DIAGNOSIS — R269 Unspecified abnormalities of gait and mobility: Secondary | ICD-10-CM | POA: Diagnosis not present

## 2020-10-31 NOTE — Progress Notes (Signed)
[name] [address] [place] [address] Phone: [phone] Subjective:   Bruce Donath, am serving as a scribe for Dr. Antoine Primas.  This visit occurred during the SARS-CoV-2 public health emergency.  Safety protocols were in place, including screening questions prior to the visit, additional usage of staff PPE, and extensive cleaning of exam room while observing appropriate contact time as indicated for disinfecting solutions.   I'm seeing this patient by the request  of:  Larae Grooms, NP  CC: Neck pain follow-up  [id number]  10/01/2020 Patient continues to have more of a neck pain.  Did have the acute stroke noted of the medulla as well as having the vertebral artery dissection.  Discussed with him that at this point time we will not do any type of osteopathic manipulation on the neck but we could consider the possibility of the thoracic area.  Patient at this point though as well as his wife would rather him do some other potential treatment.  Encouraged home exercises and given handout.  Patient is starting with formal physical therapy and I think will be doing relatively well.  Does have gabapentin and we discussed potentially increasing if necessary.  Also does have Robaxin if necessary.  When patient comes back can consider the possibility of trigger point injections or formal osteopathic manipulation.  Follow-up with me again in 4 to 6 weeks.  Updated 11/01/2020 ERVAN HEBER is a 35 y.o. male coming in with complaint of neck pain. Pain has subsided since last visit.  Patient is feeling like he is making significant strides.  Noticing that his eyesight is getting better.  Does have some mild upper back and lower back pain.  Patient would like to see if any type of manipulation could be helpful there.  Patient would like to start becoming more active. Very happy that with the progress he has made gains in physical therapy     No  past medical history on file. No past surgical history on file. Social History   Socioeconomic History   Marital status: Married    Spouse name: Not on file   Number of children: Not on file   Years of education: Not on file   Highest education level: Not on file  Occupational History   Not on file  Tobacco Use   Smoking status: Some Days    Types: Cigars   Smokeless tobacco: Never  Substance and Sexual Activity   Alcohol use: Yes    Alcohol/week: 2.0 standard drinks    Types: 2 Shots of liquor per week   Drug use: Never   Sexual activity: Not on file  Other Topics Concern   Not on file  Social History Narrative   Not on file   Social Determinants of Health   Financial Resource Strain: Not on file  Food Insecurity: Not on file  Transportation Needs: Not on file  Physical Activity: Not on file  Stress: Not on file  Social Connections: Not on file   No Known Allergies No family history on file.   Current Outpatient Medications (Cardiovascular):    atorvastatin (LIPITOR) 40 MG tablet, Take 1 tablet (40 mg total) by mouth daily.   Current Outpatient Medications (Analgesics):    acetaminophen-codeine (TYLENOL #3) 300-30 MG tablet, Take 1 tablet by mouth every 6 (six) hours as needed for moderate pain.   aspirin EC 81 MG EC tablet, Take 1 tablet (81 mg total) by mouth daily. Swallow  whole.  Current Outpatient Medications (Hematological):    clopidogrel (PLAVIX) 75 MG tablet, Take 1 tablet (75 mg total) by mouth daily.   vitamin B-12 (CYANOCOBALAMIN) 1000 MCG tablet, Take 1,000 mcg by mouth daily.  Current Outpatient Medications (Other):    cholecalciferol (VITAMIN D3) 25 MCG (1000 UNIT) tablet, Take 1,000 Units by mouth daily.   FLUoxetine (PROZAC) 20 MG capsule, Take 1 capsule (20 mg total) by mouth daily.   gabapentin (NEURONTIN) 300 MG capsule, Take 1 capsule (300 mg total) by mouth 3 (three) times daily.   lidocaine (LIDODERM) 5 %, Place 1 patch onto the skin  daily. Remove & Discard patch within 12 hours or as directed by MD   meclizine (ANTIVERT) 50 MG tablet, Take 1 tablet (50 mg total) by mouth 3 (three) times daily.   methocarbamol (ROBAXIN) 750 MG tablet, Take 1 tablet (750 mg total) by mouth every 8 (eight) hours as needed for muscle spasms.   Reviewed prior external information including notes and imaging from  primary care provider As well as notes that were available from care everywhere and other healthcare systems.  Past medical history, social, surgical and family history all reviewed in electronic medical record.  No pertanent information unless stated regarding to the chief complaint.   Review of Systems:  No headache, visual changes, nausea, vomiting, diarrhea, constipation, dizziness, abdominal pain, skin rash, fevers, chills, night sweats, weight loss, swollen lymph nodes, body aches, joint swelling, chest pain, shortness of breath, mood changes. POSITIVE muscle aches  Objective  Blood pressure 116/80, pulse 84, height 6' (1.829 m), weight (!) 307 lb (139.3 kg), SpO2 96 %.   General: No apparent distress alert and oriented x3 mood and affect normal, dressed appropriately.  HEENT: Pupils equal, patient is no longer wearing an eye patch Respiratory: Patient's speak in full sentences and does not appear short of breath  Cardiovascular: No lower extremity edema, non tender, no erythema  Gait normal with good balance and coordination.  Patient is ambulating significantly better. MSK:   Overweight.  Patient does have tightness mostly in the thoracolumbar juncture.  Tightness on the right compared to left.  Does have tightness with FABER test bilaterally.  Osteopathic findings T8 extended rotated and side bent right T11 flexed rotated and side bent right L1 flexed rotated and side bent left Sacrum right on right   Impression and Recommendations:     The above documentation has been reviewed and is accurate and complete [name], DO

## 2020-10-31 NOTE — Therapy (Signed)
[facility] MAIN [facility] SERVICES [address] [place], [place], [address] Phone: [phone]   Fax:  [phone]  Occupational Therapy Treatment  Patient Details  Name: Curtis [name] MRN: [id number] Date of Birth: 08-11-1985 Referring Provider (OT): [name]   Encounter Date: 10/31/2020   OT End of Session - 10/31/20 1537     Visit Number 7    Number of Visits 24    Date for OT Re-Evaluation 12/25/20    Authorization Time Period Progress report period starting 10/02/2020    OT Start Time 1430    OT Stop Time 1515    OT Time Calculation (min) 45 min    Activity Tolerance Patient tolerated treatment well    Behavior During Therapy [facility] for tasks assessed/performed             No past medical history on file.  No past surgical history on file.  There were no vitals filed for this visit.   Subjective Assessment - 10/31/20 1530     Subjective  Pt. reports that they are going to be scheduling an appointment for him with the Neuro ophthalmologist. .    Patient is accompanied by: Family member    Pertinent History Pt. is a 35 y.o. male who was diagnosed with Vertebral Artery Dissection, and Small Acute CVA. Pt. has Dizziness, nausea, diplopia, and fatigue. Pt.'s 1st child was born 3 months ago. Pt. 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    Currently in Pain? Yes    Pain Score 2     Pain Location Neck    Pain Orientation Left    Pain Descriptors / Indicators Aching    Pain Type Chronic pain            OT TREATMENT    Pt. had a follow-up appointment with his Physician last week. Pt. reports that they recommended to have the patch removed at all times now. Pt. reports they are scheduling him for an appointment with a neuro-ophthalmologist. Pt. was able to complete visual saccades activities at the vertical whiteboard. Pt. worked on scanning left to right, as well as scanning through all quadrants.Pt. worked on saccades, and  visual scanning 2 rows of letters with progressively increasing scanning plane. Pt. worked on visual scanning tasks at the tabletop identifying double numbers efficiently with 100% accuracy. Pt. worked on reading aloud underlined letters placed randomly among rows of numbers. Pt. was able to identify letters accurately on the first 1/4 of the page. Pt. worked on typing while copying sentences from a page placed vertically to the right. Pt. was able to type the 1st 1/4 of the page with 75% accuracy. Pt. worked on reaching, and accurately placing washers on vertical, and diagonal dowels. Pt. consistently missed the target of the furthest over, and under reaching the targets. Pt. Continues to work on improving visual compensatory strategies in order to work towards improving, and maximizing independence with ADLs, and IADLs.                               OT Short Term Goals - 10/02/20 1650       OT SHORT TERM GOAL #1   Title Pt. will improve FOTO score by 3 points for clinically relevant change during ADLs, and IADL tasks.    Baseline Eval: FOTO score 63 TR score 75    Time 12    Period Weeks  Status New    Target Date 11/13/20               OT Long Term Goals - 10/02/20 1652       OT LONG TERM GOAL #1   Title Pt. will improve UE strength in order to be able to hold, and feed his baby with modified independence.    Baseline Eval: Pt. is unable to hold, and feed his baby.    Time 12    Period Weeks    Status New    Target Date 12/25/20      OT LONG TERM GOAL #2   Title Pt. will perform light homemaking tasks with modified indepedence in standing    Baseline EVal: Limited standing tolerance during home management tasks.    Time 12    Period Weeks    Status New    Target Date 12/25/20      OT LONG TERM GOAL #3   Title Pt. will independently demonstrate visual compensatory strategies 100% of the time during tabletop ADLs, and IADL tasks.    Baseline Eval:  Pt. has difficulty    Time 12    Period Weeks    Status New    Target Date 12/25/20      OT LONG TERM GOAL #4   Title Pt. will demonstrate visual compensatory strategies 100% of the time while performing IADL tasks within his extrpersonal space.    Baseline Eval: Pt. has difficulty    Time 12    Period Weeks    Status New    Target Date 12/25/20                   Plan - 10/31/20 1537     Clinical Impression Statement Pt. had a follow-up appointment with his Physician last week. Pt. reports that they recommended to have the patch removed at all times now. Pt. reports they are scheduling him for an appointment with a neuro-ophthalmologist. Pt. was able to complete visual saccades activities at the vertical whiteboard. Pt. worked on scanning left to right, as well as scanning through all quadrants.Pt. worked on saccades, and visual scanning 2 rows of letters with progressively increasing scanning plane. Pt. worked on visual scanning tasks at the tabletop identifying double numbers efficiently with 100% accuracy. Pt. worked on reading aloud underlined letters placed randomly among rows of numbers. Pt. was able to identify letters accurately on the first 1/4 of the page. Pt. worked on typing while copying sentences from a page placed vertically to the right. Pt. was able to type the 1st 1/4 of the page with 75% accuracy. Pt. worked on reaching, and accurately placing washers on vertical, and diagonal dowels. Pt. consistently missed the target of the furthest over, and under reaching the targets. Pt. Continues to work on improving visual compensatory strategies in order to work towards improving, and maximizing independence with ADLs, and IADLs.     OT Occupational Profile and History Detailed Assessment- Review of Records and additional review of physical, cognitive, psychosocial history related to current functional performance    Occupational performance deficits (Please refer to evaluation  for details): IADL's;ADL's    Body Structure / Function / Physical Skills ADL;IADL    Rehab Potential Good    Clinical Decision Making Several treatment options, min-mod task modification necessary    Comorbidities Affecting Occupational Performance: May have comorbidities impacting occupational performance    Modification or Assistance to Complete Evaluation  Min-Moderate modification of tasks or  assist with assess necessary to complete eval    OT Frequency 3x / week    OT Duration 12 weeks    OT Treatment/Interventions Therapeutic exercise;DME and/or AE instruction;Patient/family education;Therapeutic activities;Self-care/ADL training    Consulted and Agree with Plan of Care Patient             Patient will benefit from skilled therapeutic intervention in order to improve the following deficits and impairments:   Body Structure / Function / Physical Skills: ADL, IADL       Visit Diagnosis: Muscle weakness (generalized)  Other lack of coordination    Problem List Patient Active Problem List   Diagnosis Date Noted   Slow transit constipation    Morbid obesity (HCC)    Neck pain    ASD (atrial septal defect)    Ischemic cerebrovascular accident (CVA) of frontal lobe (HCC) 09/13/2020   Stroke (HCC) 09/10/2020   Lateral medullary syndrome    Vertebral artery dissection (HCC) 09/09/2020   Obesity (BMI 35.0-39.9 without comorbidity) 09/09/2020   Essential hypertension 09/09/2020   Acute stroke of medulla oblongata (HCC)     Olegario Messier, MS, OTR/L 10/31/2020, 3:40 PM  [facility] MAIN [facility] SERVICES [address] [place], [place], [address] Phone: [phone]   Fax:  [phone]  Name: Curtis [name] MRN: [id number] Date of Birth: 07-31-1985

## 2020-10-31 NOTE — Therapy (Signed)
[place] [facility] MAIN [facility] SERVICES [address] [place], [place], [address] Phone: [phone]   Fax:  [phone]  Physical Therapy Treatment  Patient Details  Name: Curtis [name] MRN: [id number] Date of Birth: 15-Mar-1985 Referring Provider (PT): Alfredo Batty   Encounter Date: 10/31/2020   PT End of Session - 10/31/20 1444     Visit Number 6    Number of Visits 24    Date for PT Re-Evaluation 12/25/20    Authorization Type [employer], no auth req    Authorization Time Period Cert 04/29/29-54/00/86    Progress Note Due on Visit 10    PT Start Time 1515    PT Stop Time 1548    PT Time Calculation (min) 33 min    Equipment Utilized During Treatment Gait belt    Activity Tolerance Patient tolerated treatment well    Behavior During Therapy [facility] for tasks assessed/performed             History reviewed. No pertinent past medical history.  History reviewed. No pertinent surgical history.  There were no vitals filed for this visit.   Subjective Assessment - 10/31/20 1443     Subjective Patient contines to report feeling better with no ill effects from any exercises this week. He states his dizziness is improving and presents with no eye patch today.    Pertinent History Curtis [name] is a 35 y.o. M with obesity no other significant PMHx presented to hospital  with acute onset nausea, dizziness. Patient had recently seen a chiropractor and being treated with cervical manipulation.  On the morning of admission he developed sudden dizziness, nausea, neck pain, and headache and so he came to the ER.    CT angiogram was obtained that showed findings consistent with dissection of the left vertebral artery.  MRI brain showed small ischemic infarct in the left lateral medulla. Pt had stay at inpatient rehab and has made significant progress in his funciton since discharge. Pt also has 3 m.o. daughter he would like to be able to have a larger part in  caring for but is restricted due to his impairments. at I.E. patient presented to therapy ambulating with rollator.    Currently in Pain? Yes    Pain Score 2     Pain Location Neck    Pain Orientation Left;Posterior    Pain Descriptors / Indicators Aching    Pain Type Chronic pain    Pain Onset More than a month ago    Pain Frequency Intermittent    Aggravating Factors  Prolonged upsupported sitting/Positioning    Pain Relieving Factors Sitting suppported    Effect of Pain on Daily Activities Decreased activity tolerance    Multiple Pain Sites No            INTERVENTIONS:    Therapeutic Exercises:   Postural strengthening using blue theraband x 10-12 reps BUE today:   Shoulder Ext (standing) - Cues for slow/controlled movement Standing scap rows- Cues to hold for 2-3 sec Standing Low trap at wall Shoulder Ext Rot Shoulder horizontal ABD Patient denied any pain during today's session- Wall angel- 12 reps (VC and visual demo for correct technique) - also reviewed video in Medbridge for demo of correct technique- Patient verbalized understanding and returned correct form with demo.   Prone Lower trap (Single arm)  x 12 reps BUE- active with no weight- Cues to leave his head in supported position with towel roll or pillow to avoid  excessive cervical ext.   Prone Horizontal shoulder Abd x 12 reps BUE- Single arm  Prone Shoulder ext x 12 reps.  Education provided throughout session via VC/TC and demonstration to facilitate movement at target joints and correct muscle activation for all  exercises performed.      Access Code: [email or web address] URL: [email or web address] Date: 10/31/2020 Prepared by: Curtis [name]  Exercises Prone Single Arm Shoulder Y - 1 x daily - 3 x weekly - 3 sets - 10 reps Prone Shoulder Horizontal Abduction - 1 x daily - 3 x weekly - 3 sets - 10 reps Wall Angels - 1 x daily - 3 x weekly - 3 sets - 10 reps Prone Shoulder Extension - Single  Arm - 1 x daily - 3 x weekly - 3 sets - 10 reps     Clinical Impression: Patient presents today with excellent motivation and participation in postural/scapular strength training. He denied any dizziness with position changes from prone to sitting or standing. He was able to perform all previous exercises with less overall VC for correct technique and performed well with newly instructed additional HEP today. Issued handout and patient able to verbalize understanding and return proper demo for all prescribed exercises without pain. He would benefit from additional skilled PT Intervention to improve strength, balance and mobility                 PT Education - 10/31/20 1443     Education Details Postural education    Person(s) Educated Patient    Methods Explanation;Demonstration;Tactile cues;Verbal cues    Comprehension Verbalized understanding;Returned demonstration;Verbal cues required;Need further instruction              PT Short Term Goals - 10/02/20 1721       PT SHORT TERM GOAL #1   Title Patient will be independent in home exercise program to improve strength/mobility for better functional independence with ADLs.    Baseline Pt has HEP from hospital discharge but it is not progressive    Time 4    Period Weeks    Status New    Target Date 10/30/20      PT SHORT TERM GOAL #2   Title Patient (< 33 years old) will complete five times sit to stand test in < 15 seconds indicating an increased LE strength and improved balance.    Baseline >20 sec    Time 4    Period Weeks    Target Date 10/30/20      PT SHORT TERM GOAL #3   Title Patient will increase six minute walk test distance to >1200 with LRAD for progression to community ambulator and improve gait ability    Baseline 1015 feet with rollator    Time 4    Period Weeks    Status New               PT Long Term Goals - 10/02/20 1724       PT LONG TERM GOAL #1   Title Patient will increase FOTO  score to equal to or greater than 15    to demonstrate statistically significant improvement in mobility and quality of life.    Baseline 63.5    Time 12    Period Weeks    Status New    Target Date 12/25/20      PT LONG TERM GOAL #2   Title Patient will increase Functional Gait Assessment score to >25/30 as to reduce fall risk and improve dynamic  gait safety with community ambulation.    Baseline 20    Time 12    Period Weeks    Status New    Target Date 12/25/20      PT LONG TERM GOAL #3   Title Patient will ascend/descend 4 stairs without rail assist independently without loss of balance to improve ability to get in/out of home.    Baseline requires use of handrails to safely naigate stairs    Time 12    Period Weeks    Status New      PT LONG TERM GOAL #4   Title Patient will increase six minute walk test distance to >1300 without AD for progression to community ambulator and improve gait ability    Baseline 1015 with rollator    Time 12    Period Weeks    Target Date 12/25/20                   Plan - 10/31/20 1444     Clinical Impression Statement Patient presents today with excellent motivation and participation in postural/scapular strength training. He denied any dizziness with position changes from prone to sitting or standing. He was able to perform all previous exercises with less overall VC for correct technique and performed well with newly instructed additional HEP today. Issued handout and patient able to verbalize understanding and return proper demo for all prescribed exercises without pain. He would benefit from additional skilled PT Intervention to improve strength, balance and mobility    Personal Factors and Comorbidities Comorbidity 1;Comorbidity 2    Comorbidities obesity, HTN    Examination-Activity Limitations Caring for Others;Carry;Lift;Locomotion Level;Squat;Stairs;Stand    Examination-Participation Restrictions Driving;Occupation;Yard Work     Conservation officer, historic buildings Evolving/Moderate complexity    Rehab Potential Excellent    PT Frequency 2x / week    PT Duration 12 weeks    PT Treatment/Interventions ADLs/Self Care Home Management;Neuromuscular re-education;Patient/family education;Manual techniques;Passive range of motion;Dry needling;Energy conservation;Vestibular;Visual/perceptual remediation/compensation;Therapeutic activities;Functional mobility training;Therapeutic exercise;Balance training;Gait training    PT Next Visit Plan MiniBest or other outcome measure, develop and provide HEP,  consider use of mirror to assist with  finding neutral    PT Home Exercise Plan Access Code: [email or web address]; 10/31/2020=Access Code: [email or web address]             Patient will benefit from skilled therapeutic intervention in order to improve the following deficits and impairments:  Abnormal gait, Decreased activity tolerance, Decreased balance, Decreased coordination, Decreased endurance, Decreased mobility, Decreased range of motion, Decreased strength, Difficulty walking, Dizziness, Impaired perceived functional ability, Impaired vision/preception  Visit Diagnosis: Abnormality of gait and mobility  Difficulty in walking, not elsewhere classified  Muscle weakness (generalized)  Unsteadiness on feet     Problem List Patient Active Problem List   Diagnosis Date Noted   Slow transit constipation    Morbid obesity (HCC)    Neck pain    ASD (atrial septal defect)    Ischemic cerebrovascular accident (CVA) of frontal lobe (HCC) 09/13/2020   Stroke (HCC) 09/10/2020   Lateral medullary syndrome    Vertebral artery dissection (HCC) 09/09/2020   Obesity (BMI 35.0-39.9 without comorbidity) 09/09/2020   Essential hypertension 09/09/2020   Acute stroke of medulla oblongata (HCC)     Lenda Kelp, PT 11/01/2020, 11:46 AM  [facility] [facility] MAIN [facility] SERVICES [address] [place], [place],  [address] Phone: [phone]   Fax:  [phone]  Name: Curtis [name] MRN: [id number] Date of Birth:  11/03/1985    

## 2020-11-01 ENCOUNTER — Ambulatory Visit (INDEPENDENT_AMBULATORY_CARE_PROVIDER_SITE_OTHER): Payer: BC Managed Care – PPO | Admitting: Family Medicine

## 2020-11-01 ENCOUNTER — Ambulatory Visit (INDEPENDENT_AMBULATORY_CARE_PROVIDER_SITE_OTHER): Payer: BC Managed Care – PPO

## 2020-11-01 VITALS — BP 116/80 | HR 84 | Ht 72.0 in | Wt 307.0 lb

## 2020-11-01 DIAGNOSIS — M9902 Segmental and somatic dysfunction of thoracic region: Secondary | ICD-10-CM | POA: Diagnosis not present

## 2020-11-01 DIAGNOSIS — I7774 Dissection of vertebral artery: Secondary | ICD-10-CM

## 2020-11-01 DIAGNOSIS — M545 Low back pain, unspecified: Secondary | ICD-10-CM

## 2020-11-01 DIAGNOSIS — M9904 Segmental and somatic dysfunction of sacral region: Secondary | ICD-10-CM

## 2020-11-01 DIAGNOSIS — M546 Pain in thoracic spine: Secondary | ICD-10-CM | POA: Diagnosis not present

## 2020-11-01 DIAGNOSIS — G8929 Other chronic pain: Secondary | ICD-10-CM

## 2020-11-01 DIAGNOSIS — M9903 Segmental and somatic dysfunction of lumbar region: Secondary | ICD-10-CM

## 2020-11-01 IMAGING — DX DG LUMBAR SPINE 2-3V
3 series · 3 of 3 positions shown · non-contrast
Comparison: No prior.

CLINICAL DATA: Lumbar spine pain.  No known injury.

EXAM:
LUMBAR SPINE - 2-3 VIEW

[l-spine ap]
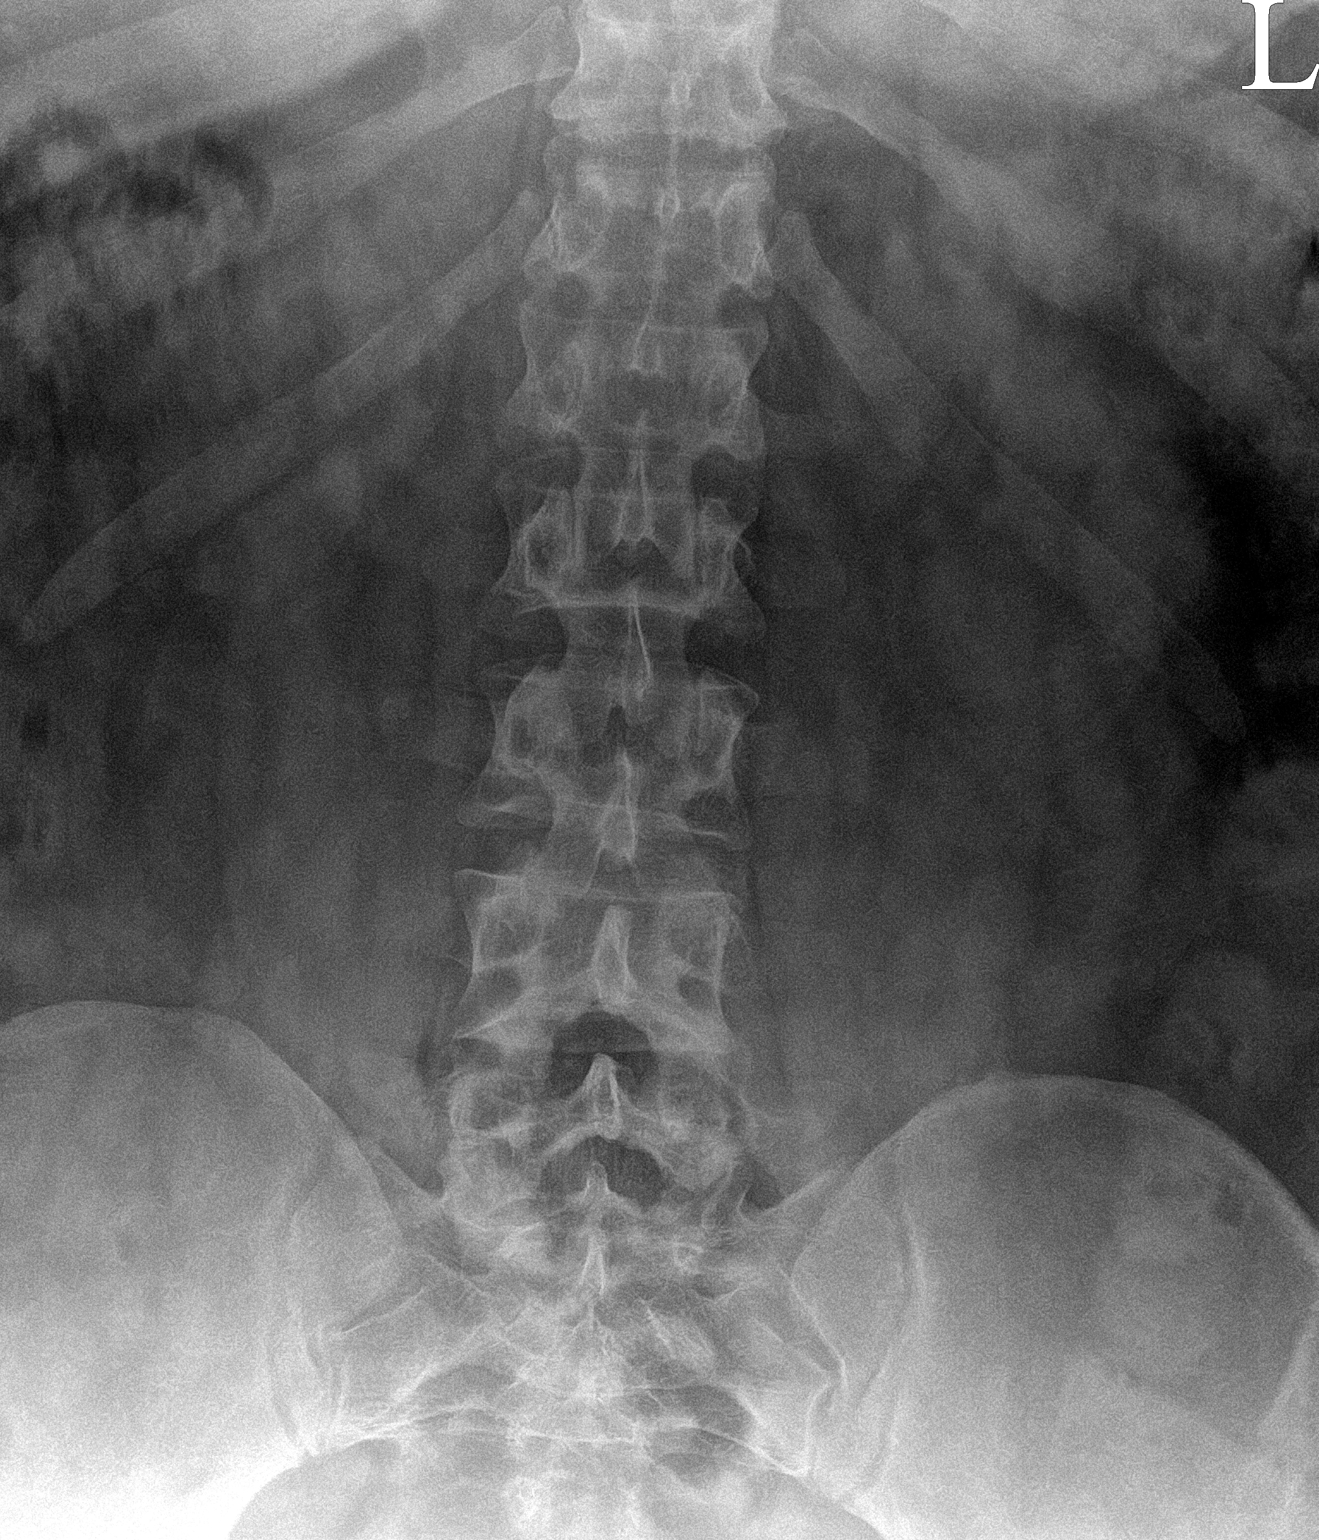

[l-spine lateral]
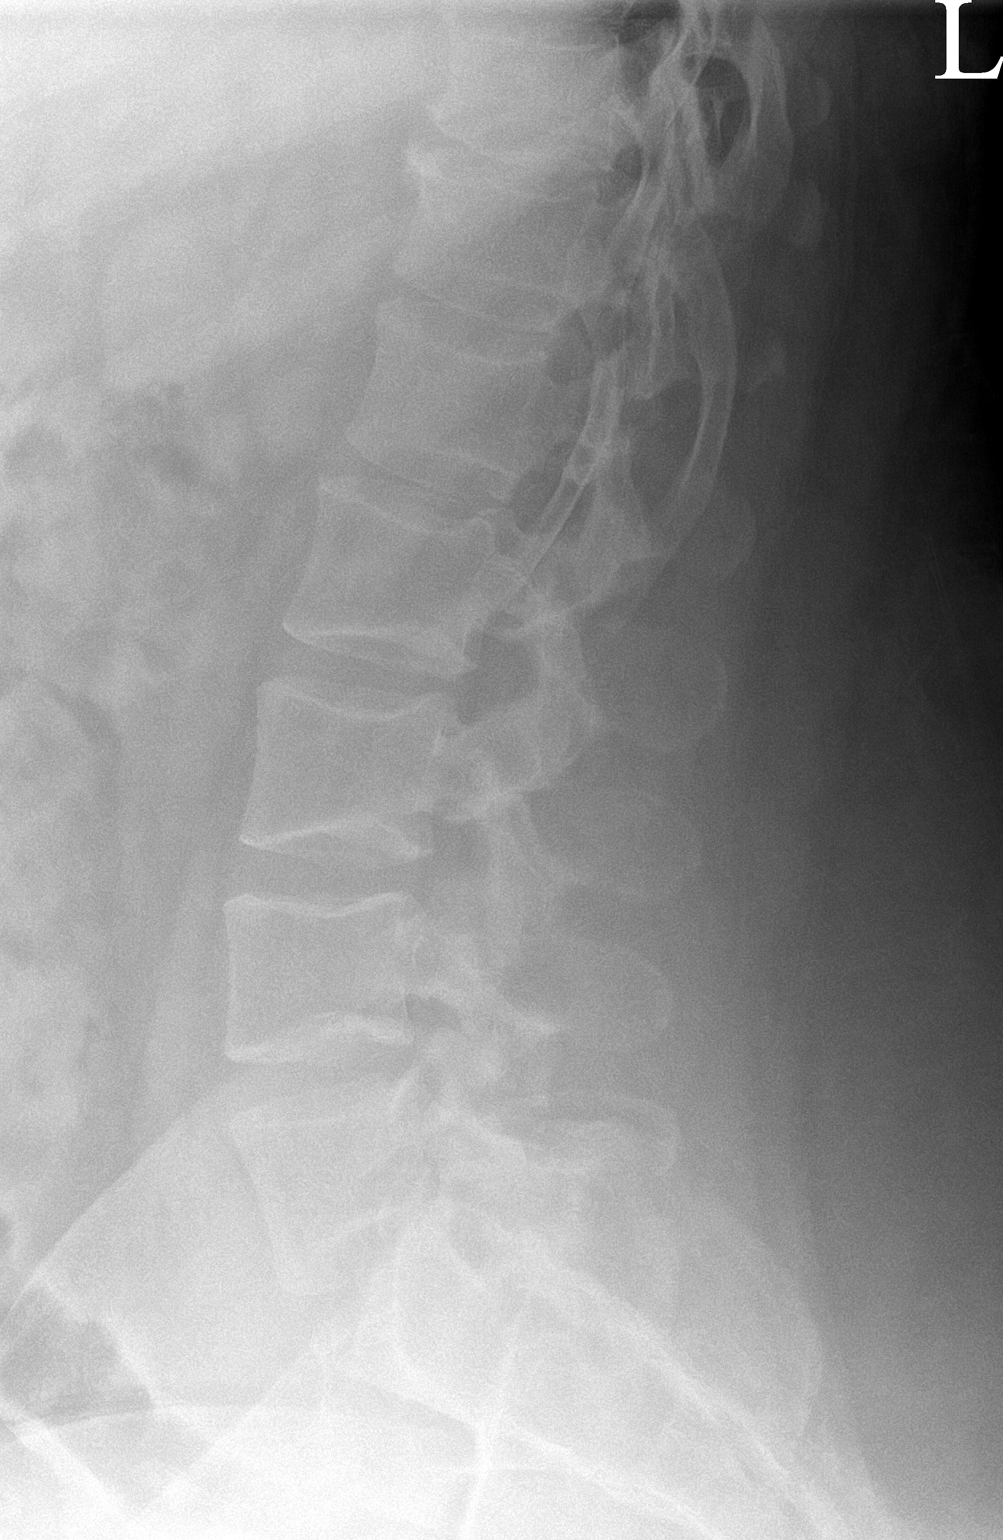

[l-spine spot]
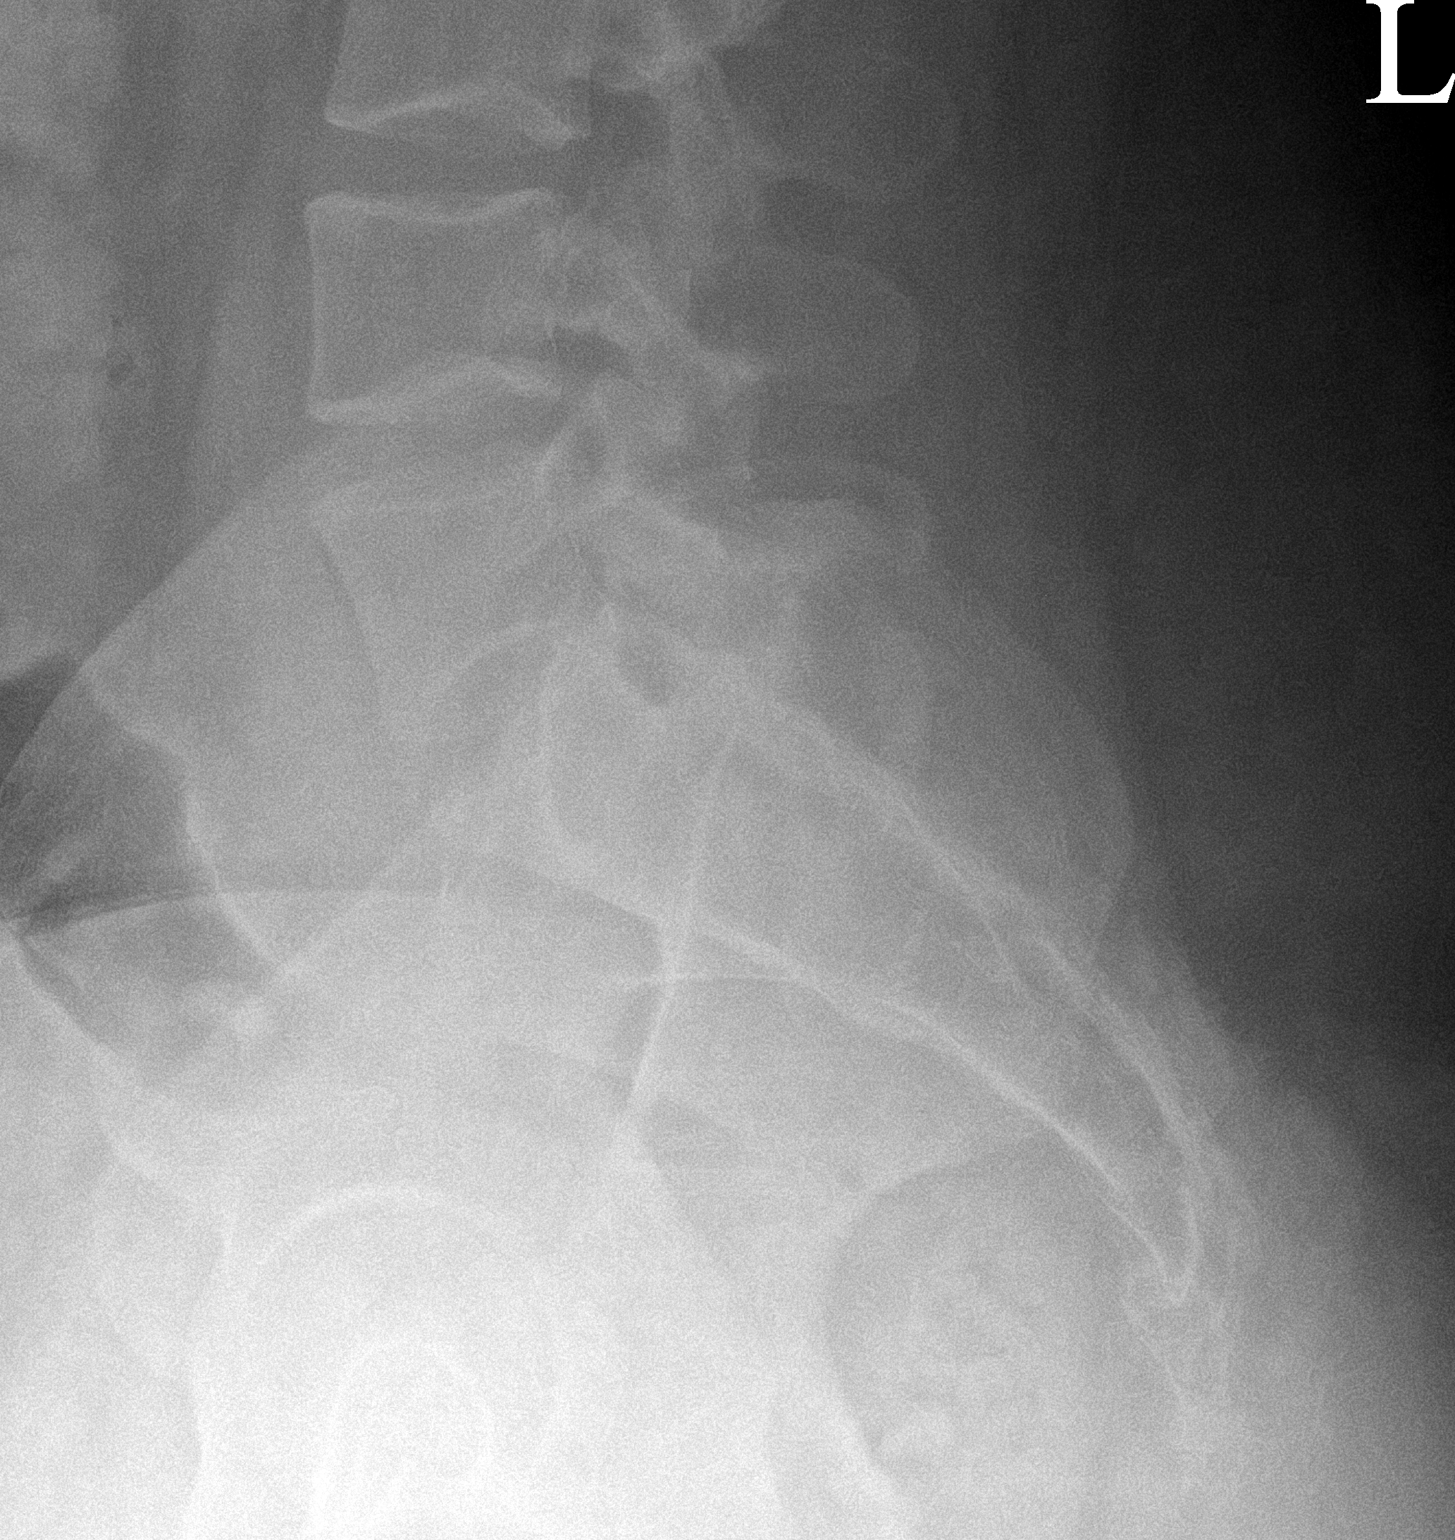

[3 of 3 positions shown; findings below may reference images not displayed]

FINDINGS: There is no evidence of lumbar spine fracture. Alignment is normal.
Intervertebral disc spaces are maintained.
IMPRESSION: No acute or focal abnormality.

## 2020-11-01 NOTE — Assessment & Plan Note (Signed)
   Decision today to treat with OMT was based on Physical Exam  After verbal consent patient was treated with  ME, FPR techniques in  thoracic, lumbar and sacral areas, all areas are chronic secondary to patient not being able to be active.  Patient tolerated the procedure well with improvement in symptoms  Patient given exercises, stretches and lifestyle modifications  See medications in patient instructions if given  Patient will follow up in 4-8 weeks

## 2020-11-01 NOTE — Patient Instructions (Signed)
Lumbar xray today See me in 6 weeks

## 2020-11-01 NOTE — Assessment & Plan Note (Signed)
Patient has had more thoracic pain.  Discussed with patient at great length about icing regimen and home exercises.  Discussed which activities to do which wants to avoid.  Increase activity slowly.  Patient will follow up with me again in 4 to 8 weeks.  Do not see him is needing manipulation for a long period of time.

## 2020-11-01 NOTE — Assessment & Plan Note (Signed)
Discussed with patient I would avoid all manipulation of the cervical spine with me or any other provider.  The patient does can have some potential left upper back manipulation and patient did respond very well to muscle energy of the thoracic and lumbar area.  Encourage patient to work on the weight loss.  Patient is going to be able to start increasing activity.  Patient encouraged to continue to work with physical therapy.

## 2020-11-04 ENCOUNTER — Other Ambulatory Visit: Payer: Self-pay

## 2020-11-04 ENCOUNTER — Ambulatory Visit: Payer: BC Managed Care – PPO | Admitting: Occupational Therapy

## 2020-11-04 ENCOUNTER — Emergency Department
Admission: EM | Admit: 2020-11-04 | Discharge: 2020-11-04 | Disposition: A | Discharge status: Home / Self Care | Payer: BC Managed Care – PPO | Attending: Emergency Medicine | Admitting: Emergency Medicine

## 2020-11-04 ENCOUNTER — Ambulatory Visit: Payer: BC Managed Care – PPO

## 2020-11-04 ENCOUNTER — Emergency Department: Payer: BC Managed Care – PPO

## 2020-11-04 DIAGNOSIS — K8050 Calculus of bile duct without cholangitis or cholecystitis without obstruction: Secondary | ICD-10-CM

## 2020-11-04 DIAGNOSIS — I10 Essential (primary) hypertension: Secondary | ICD-10-CM | POA: Diagnosis not present

## 2020-11-04 DIAGNOSIS — R1011 Right upper quadrant pain: Secondary | ICD-10-CM | POA: Diagnosis not present

## 2020-11-04 DIAGNOSIS — Z7902 Long term (current) use of antithrombotics/antiplatelets: Secondary | ICD-10-CM | POA: Diagnosis not present

## 2020-11-04 DIAGNOSIS — K8020 Calculus of gallbladder without cholecystitis without obstruction: Secondary | ICD-10-CM | POA: Diagnosis not present

## 2020-11-04 DIAGNOSIS — Z7982 Long term (current) use of aspirin: Secondary | ICD-10-CM | POA: Insufficient documentation

## 2020-11-04 DIAGNOSIS — F411 Generalized anxiety disorder: Secondary | ICD-10-CM | POA: Diagnosis not present

## 2020-11-04 DIAGNOSIS — F4322 Adjustment disorder with anxiety: Secondary | ICD-10-CM | POA: Diagnosis not present

## 2020-11-04 DIAGNOSIS — F17290 Nicotine dependence, other tobacco product, uncomplicated: Secondary | ICD-10-CM | POA: Insufficient documentation

## 2020-11-04 HISTORY — DX: Cerebral infarction, unspecified: I63.9

## 2020-11-04 LAB — CBC
HCT: 45.6 % (ref 39.0–52.0)
Hemoglobin: 15.7 g/dL (ref 13.0–17.0)
MCH: 29.6 pg (ref 26.0–34.0)
MCHC: 34.4 g/dL (ref 30.0–36.0)
MCV: 85.9 fL (ref 80.0–100.0)
Platelets: 252 10*3/uL (ref 150–400)
RBC: 5.31 MIL/uL (ref 4.22–5.81)
RDW: 12.4 % (ref 11.5–15.5)
WBC: 10.8 10*3/uL — ABNORMAL HIGH (ref 4.0–10.5)
nRBC: 0 % (ref 0.0–0.2)

## 2020-11-04 LAB — COMPREHENSIVE METABOLIC PANEL
ALT: 64 U/L — ABNORMAL HIGH (ref 0–44)
AST: 47 U/L — ABNORMAL HIGH (ref 15–41)
Albumin: 4 g/dL (ref 3.5–5.0)
Alkaline Phosphatase: 75 U/L (ref 38–126)
Anion gap: 8 (ref 5–15)
BUN: 16 mg/dL (ref 6–20)
CO2: 27 mmol/L (ref 22–32)
Calcium: 9.4 mg/dL (ref 8.9–10.3)
Chloride: 103 mmol/L (ref 98–111)
Creatinine, Ser: 0.93 mg/dL (ref 0.61–1.24)
GFR, Estimated: >60 mL/min (ref >60)
Glucose, Bld: 103 mg/dL — ABNORMAL HIGH (ref 70–99)
Potassium: 4.2 mmol/L (ref 3.5–5.1)
Sodium: 138 mmol/L (ref 135–145)
Total Bilirubin: 0.9 mg/dL (ref 0.3–1.2)
Total Protein: 7.1 g/dL (ref 6.5–8.1)

## 2020-11-04 LAB — LIPASE, BLOOD: Lipase: 41 U/L (ref 11–51)

## 2020-11-04 IMAGING — US US ABDOMEN LIMITED
1 series · 14 of 25 positions shown · non-contrast
Comparison: None.

CLINICAL DATA: Right upper quadrant pain

EXAM:
ULTRASOUND ABDOMEN LIMITED RIGHT UPPER QUADRANT

[Series 1: us abdomen limited ruq (liver/gb) · 14 of 44 slices shown]
[im 1/44]
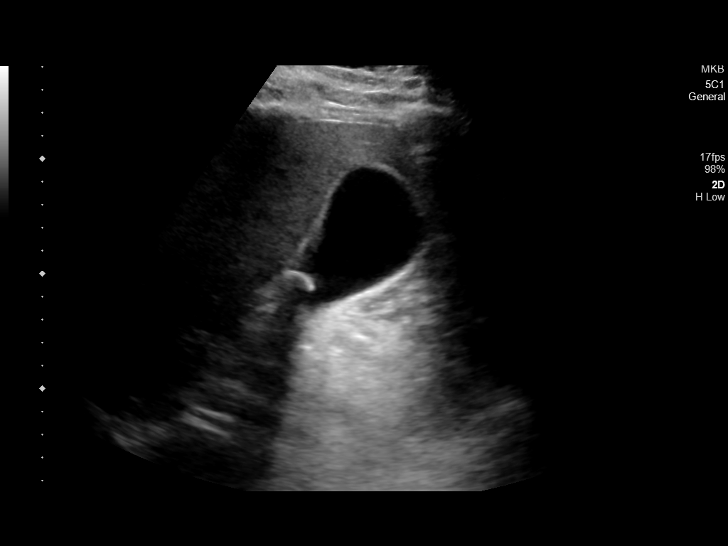
[im 4/44]
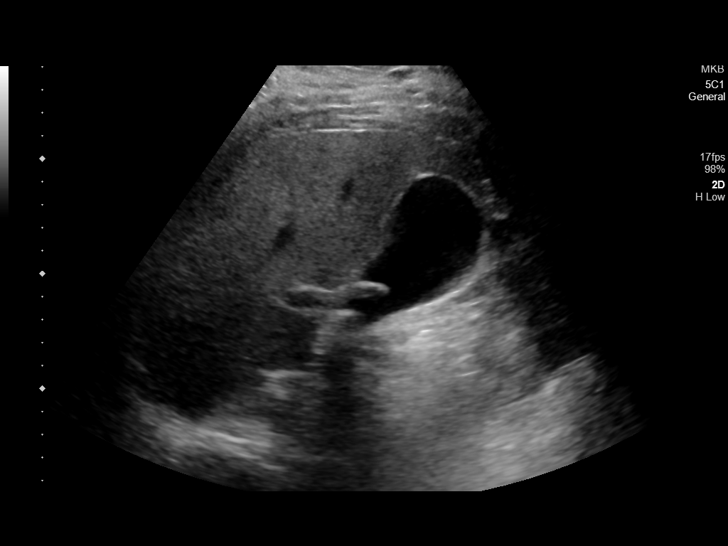
[im 8/44]
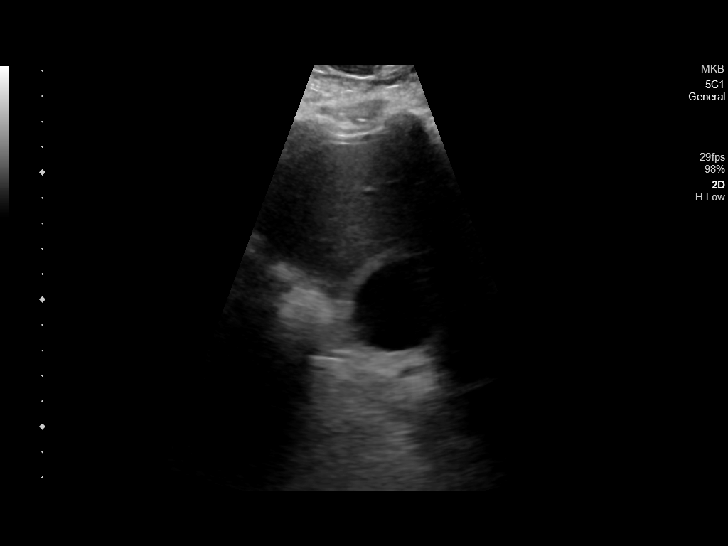
[im 11/44]
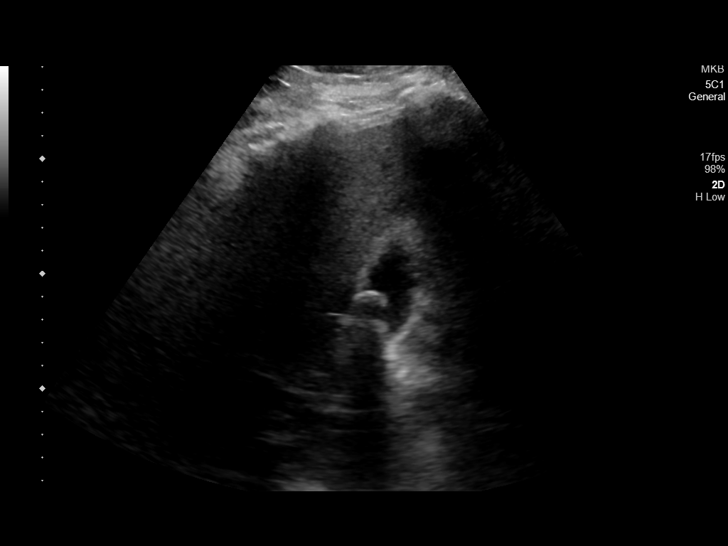
[im 15/44]
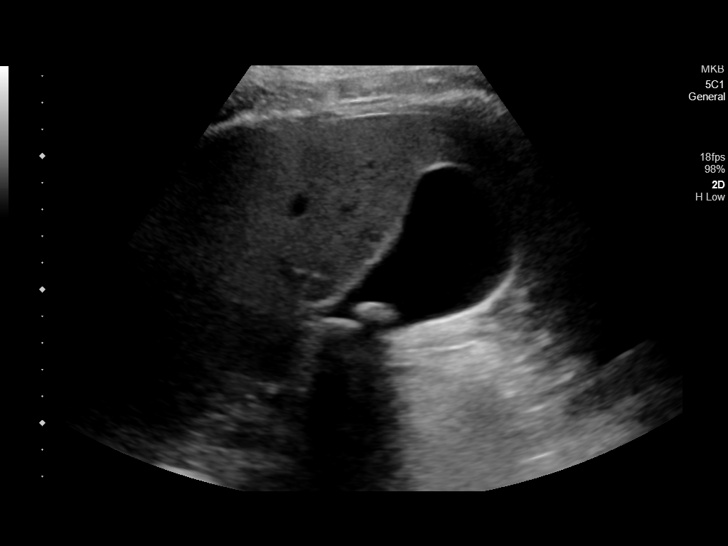
[im 17/44]
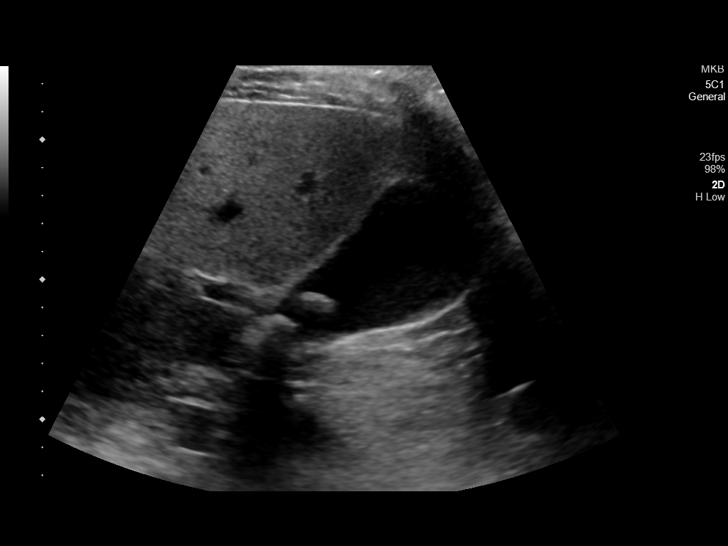
[im 20/44]
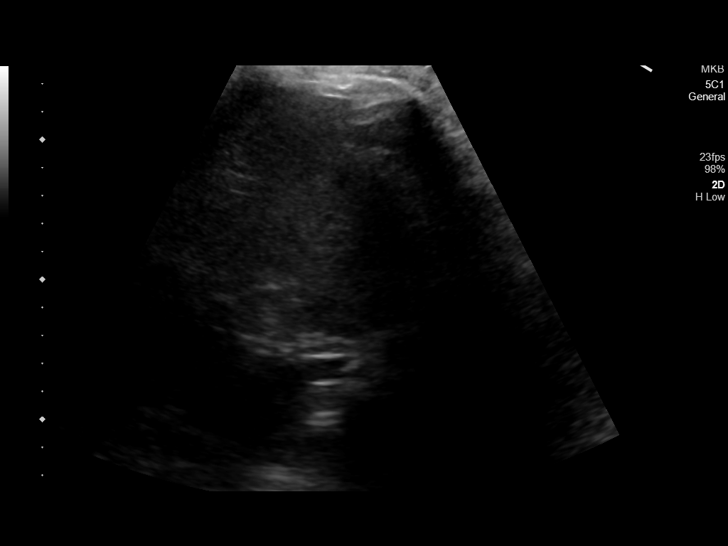
[im 24/44]
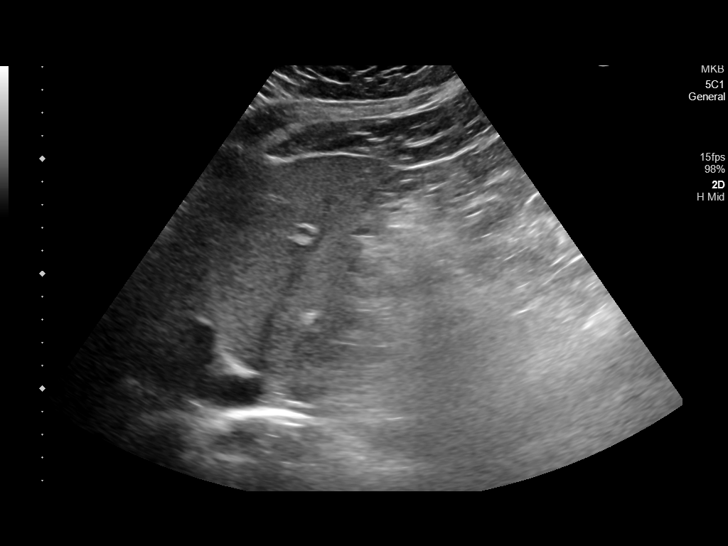
[im 27/44]
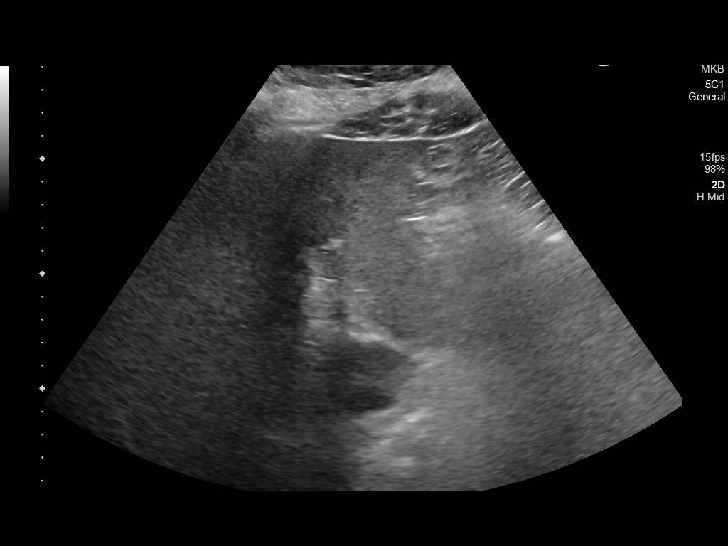
[im 29/44]
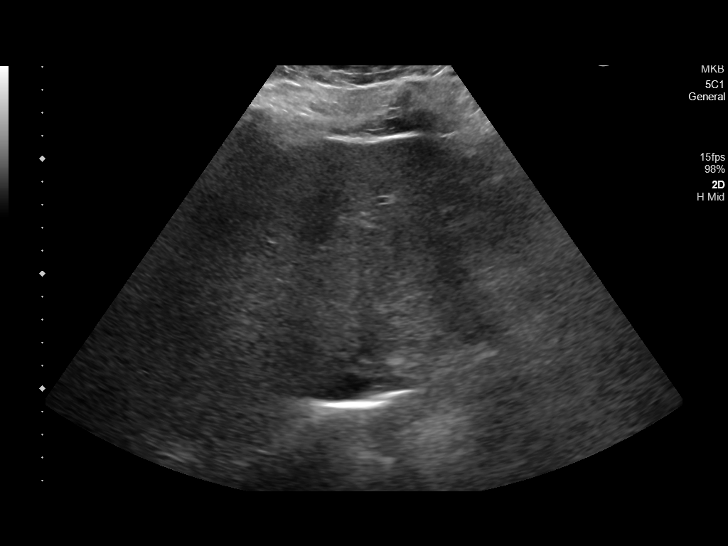
[im 33/44]
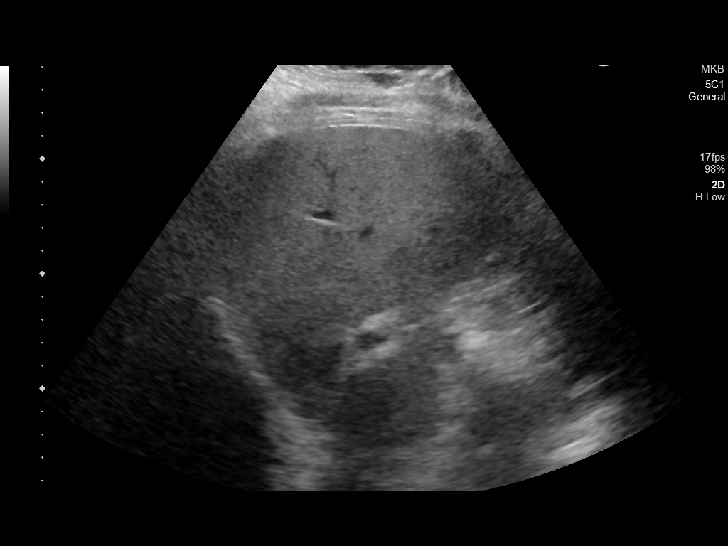
[im 36/44]
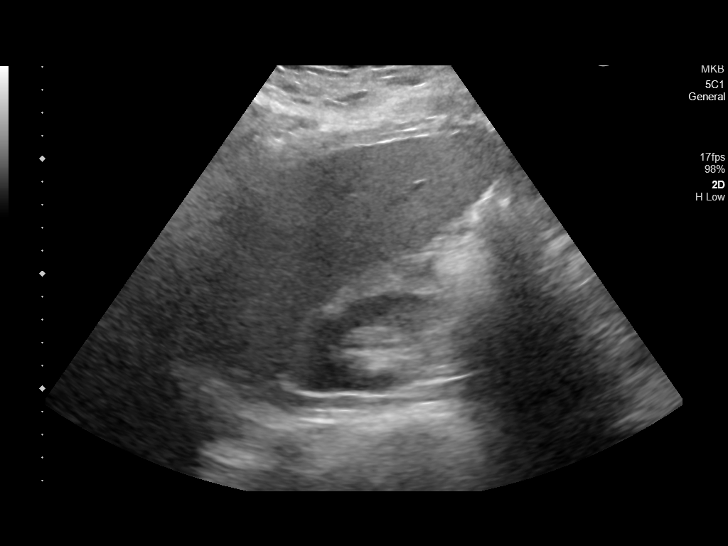
[im 40/44]
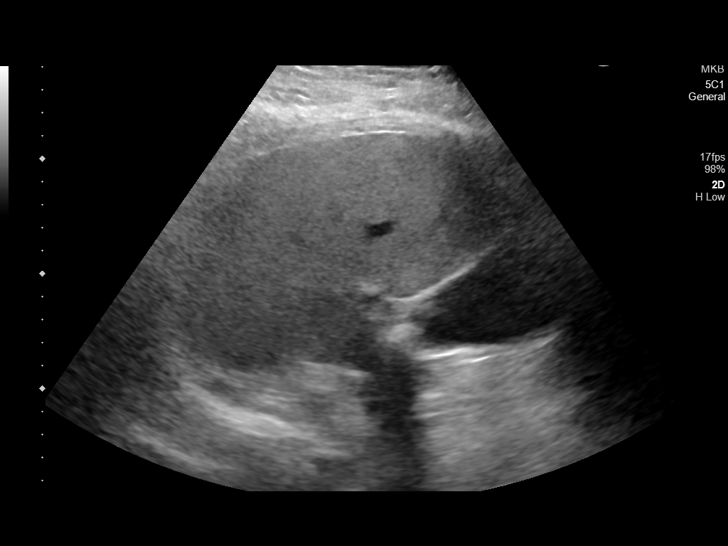
[im 44/44]
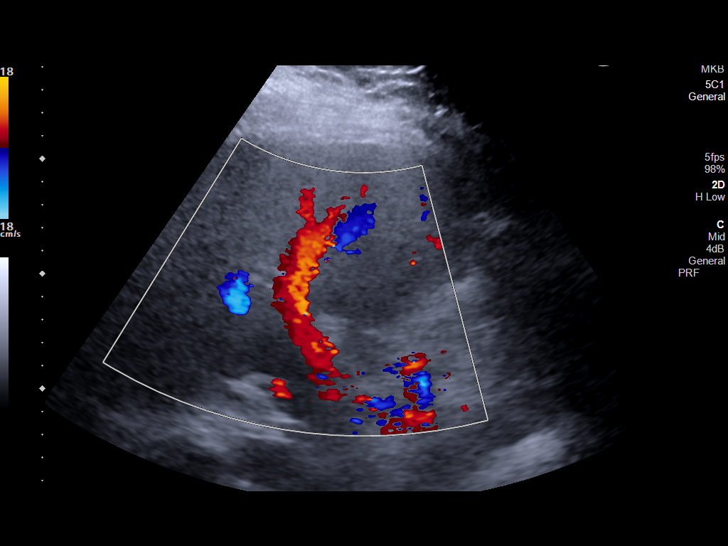

[14 of 25 positions shown; findings below may reference images not displayed]

FINDINGS: Gallbladder:

At least 2 gallstones are identified toward the neck measuring up to
1.4 cm. No wall thickening visualized. No pericholecystic fluid. No
sonographic Murphy sign noted by sonographer.

Common bile duct:

Diameter: 4.8 mm, normal

Liver:

No focal lesion identified. Increased parenchymal echogenicity.
Portal vein is patent on color Doppler imaging with normal direction
of blood flow towards the liver.

Other: None.
IMPRESSION: Cholelithiasis without sonographic evidence of acute cholecystitis.

Increased liver echogenicity likely reflecting steatosis.

## 2020-11-04 MED ORDER — ONDANSETRON HCL 4 MG/2ML IJ SOLN
4.0000 mg | Freq: Once | INTRAMUSCULAR | Status: AC
  Administered 2020-11-04: 4 mg via INTRAVENOUS
  Filled 2020-11-04: qty 2

## 2020-11-04 MED ORDER — FAMOTIDINE IN NACL 20-0.9 MG/50ML-% IV SOLN
20.0000 mg | Freq: Once | INTRAVENOUS | Status: AC
  Administered 2020-11-04: 20 mg via INTRAVENOUS
  Filled 2020-11-04: qty 50

## 2020-11-04 MED ORDER — ONDANSETRON 4 MG PO TBDP: 4.0000 mg | ORAL_TABLET | Freq: Three times a day (TID) | ORAL | 0 refills | Status: DC | PRN

## 2020-11-04 MED ORDER — OXYCODONE HCL 5 MG PO TABS: 5.0000 mg | ORAL_TABLET | Freq: Three times a day (TID) | ORAL | 0 refills | Status: DC | PRN

## 2020-11-04 NOTE — ED Provider Notes (Signed)
[facility] Emergency Department Provider Note ____________________________________________   Event Date/Time   First MD Initiated Contact with Patient 11/04/20 [phone]     (approximate)  I have reviewed the triage vital signs and the nursing notes.  HISTORY  Chief Complaint Abdominal Pain   HPI AARONJAMES KELSAY is a 35 y.o. malewho presents to the ED for evaluation of abd pain and nausea.   Chart review indicates morbid obesity, stroke from left VA dissection.  On DAPT with Plavix.  Patient presents to the ED from home for evaluation of upper abdominal pain and nausea for the past 2.5 hours.  He reports pain starting around 5 AM this morning, has been constant and progressive since that time.  Reports moderate intensity pain with associated nausea, but no emesis.  Reports having a "normal" bowel movement this morning without changes to his pain, denies dysuria or hematuria.  Denies chest pain, shortness of breath, cough or trauma.  Denies fevers or syncope.  Past Medical History:  Diagnosis Date   Stroke [facility])     Patient Active Problem List   Diagnosis Date Noted   Thoracic back pain 11/01/2020   Somatic dysfunction of spine, thoracic 11/01/2020   Slow transit constipation    Morbid obesity (HCC)    Neck pain    ASD (atrial septal defect)    Ischemic cerebrovascular accident (CVA) of frontal lobe (HCC) 09/13/2020   Stroke (HCC) 09/10/2020   Lateral medullary syndrome    Vertebral artery dissection (HCC) 09/09/2020   Obesity (BMI 35.0-39.9 without comorbidity) 09/09/2020   Essential hypertension 09/09/2020   Acute stroke of medulla oblongata (HCC)     History reviewed. No pertinent surgical history.  Prior to Admission medications   Medication Sig Start Date End Date Taking? Authorizing Provider  ondansetron (ZOFRAN ODT) 4 MG disintegrating tablet Take 1 tablet (4 mg total) by mouth every 8 (eight) hours as needed for nausea or vomiting.  11/04/20  Yes Delton Prairie, MD  oxyCODONE (ROXICODONE) 5 MG immediate release tablet Take 1 tablet (5 mg total) by mouth every 8 (eight) hours as needed for severe pain or breakthrough pain. 11/04/20 11/04/21 Yes Delton Prairie, MD  acetaminophen-codeine (TYLENOL #3) 300-30 MG tablet Take 1 tablet by mouth every 6 (six) hours as needed for moderate pain. 09/25/20   [name], Mcarthur Rossetti, PA-C  aspirin EC 81 MG EC tablet Take 1 tablet (81 mg total) by mouth daily. Swallow whole. 09/14/20   Leroy Sea, MD  atorvastatin (LIPITOR) 40 MG tablet Take 1 tablet (40 mg total) by mouth daily. 10/17/20   Ihor Austin, NP  cholecalciferol (VITAMIN D3) 25 MCG (1000 UNIT) tablet Take 1,000 Units by mouth daily.    [provider]  clopidogrel (PLAVIX) 75 MG tablet Take 1 tablet (75 mg total) by mouth daily. 10/17/20   Ihor Austin, NP  FLUoxetine (PROZAC) 20 MG capsule Take 1 capsule (20 mg total) by mouth daily. 10/25/20   Raulkar, Drema Pry, MD  gabapentin (NEURONTIN) 300 MG capsule Take 1 capsule (300 mg total) by mouth 3 (three) times daily. 10/17/20   Ihor Austin, NP  lidocaine (LIDODERM) 5 % Place 1 patch onto the skin daily. Remove & Discard patch within 12 hours or as directed by MD 09/25/20   [name], Mcarthur Rossetti, PA-C  meclizine (ANTIVERT) 50 MG tablet Take 1 tablet (50 mg total) by mouth 3 (three) times daily. 10/17/20   Ihor Austin, NP  methocarbamol (ROBAXIN) 750 MG tablet Take 1  tablet (750 mg total) by mouth every 8 (eight) hours as needed for muscle spasms. 09/25/20   [name], Mcarthur Rossetti, PA-C  vitamin B-12 (CYANOCOBALAMIN) 1000 MCG tablet Take 1,000 mcg by mouth daily.    [provider]    Allergies Patient has no known allergies.  No family history on file.  Social History Social History   Tobacco Use   Smoking status: Some Days    Types: Cigars   Smokeless tobacco: Never  Substance Use Topics   Alcohol use: Yes    Alcohol/week: 2.0 standard drinks    Types: 2  Shots of liquor per week   Drug use: Never    Review of Systems  Constitutional: No fever/chills Eyes: No visual changes. ENT: No sore throat. Cardiovascular: Denies chest pain. Respiratory: Denies shortness of breath. Gastrointestinal: Positive for RUQ pain with nausea  no vomiting.  No diarrhea.  No constipation. Genitourinary: Negative for dysuria. Musculoskeletal: Negative for back pain. Skin: Negative for rash. Neurological: Negative for headaches, focal weakness or numbness.  ____________________________________________   PHYSICAL EXAM:  VITAL SIGNS: Vitals:   11/04/20 0708 11/04/20 0800  BP: (!) 151/89 138/90  Pulse: (!) 57 60  Resp: 19 13  Temp:    SpO2: 95% 99%    Constitutional: Alert and oriented. Well appearing and in no acute distress.  Morbidly obese.  Pleasant and conversational in full sentences. Eyes: Conjunctivae are normal. PERRL. EOMI. Head: Atraumatic. Nose: No congestion/rhinnorhea. Mouth/Throat: Mucous membranes are moist.  Oropharynx non-erythematous. Neck: No stridor. No cervical spine tenderness to palpation. Cardiovascular: Normal rate, regular rhythm. Grossly normal heart sounds.  Good peripheral circulation. Respiratory: Normal respiratory effort.  No retractions. Lungs CTAB. Gastrointestinal: Soft , nondistended. No CVA tenderness. Epigastric and RUQ tenderness without peritoneal features.  No guarding.  Lower abdomen is benign. Musculoskeletal: No lower extremity tenderness nor edema.  No joint effusions. No signs of acute trauma. Neurologic:  Normal speech and language. No gross focal neurologic deficits are appreciated.  Skin:  Skin is warm, dry and intact. No rash noted. Psychiatric: Mood and affect are normal. Speech and behavior are normal. ____________________________________________   LABS (all labs ordered are listed, but only abnormal results are displayed)  Labs Reviewed  COMPREHENSIVE METABOLIC PANEL - Abnormal; Notable for  the following components:      Result Value   Glucose, Bld 103 (*)    AST 47 (*)    ALT 64 (*)    All other components within normal limits  CBC - Abnormal; Notable for the following components:   WBC 10.8 (*)    All other components within normal limits  LIPASE, BLOOD  URINALYSIS, COMPLETE (UACMP) WITH MICROSCOPIC   ____________________________________________  12 Lead EKG  Sinus rhythm, rate of 56 bpm.  Normal axis and intervals.  No evidence of acute ischemia.  1 PAC. ____________________________________________  RADIOLOGY  ED MD interpretation: RUQ ultrasound reviewed by me with stones without cholecystitis  Official radiology report(s): US ABDOMEN LIMITED RUQ (LIVER/GB)  Result Date: 11/04/2020 CLINICAL DATA:  Right upper quadrant pain EXAM: ULTRASOUND ABDOMEN LIMITED RIGHT UPPER QUADRANT COMPARISON:  None. FINDINGS: Gallbladder: At least 2 gallstones are identified toward the neck measuring up to 1.4 cm. No wall thickening visualized. No pericholecystic fluid. No sonographic Murphy sign noted by sonographer. Common bile duct: Diameter: 4.8 mm, normal Liver: No focal lesion identified. Increased parenchymal echogenicity. Portal vein is patent on color Doppler imaging with normal direction of blood flow towards the liver. Other: None. IMPRESSION: Cholelithiasis without  sonographic evidence of acute cholecystitis. Increased liver echogenicity likely reflecting steatosis. Electronically Signed   By: Guadlupe Spanish M.D.   On: 11/04/2020 08:23    ____________________________________________   PROCEDURES and INTERVENTIONS  Procedure(s) performed (including Critical Care):  Procedures  Medications  ondansetron (ZOFRAN) injection 4 mg (4 mg Intravenous Given 11/04/20 0754)  famotidine (PEPCID) IVPB 20 mg premix (0 mg Intravenous Stopped 11/04/20 0827)    ____________________________________________   MDM / ED COURSE   Obese 35 year old male presents to the ED with  evidence of symptomatic cholelithiasis, and amenable to outpatient management with surgical follow-up.  Normal vitals.  Exam generally reassuring without peritoneal abdomen or distress.  Mild upper abdominal tenderness.  RUQ ultrasound confirms cholelithiasis without sonographic evidence of cholecystitis.  No increased CBD diameter or bilirubinemia to suggest biliary obstruction, or choledocholithiasis.  No cardiopulmonary symptoms to suggest lower thoracic etiology of pain.  Will discharge with surgical referral and return precautions.  Clinical Course as of 11/04/20 0849  Mon Nov 04, 2020  5885 Discussed possible and likely etiologies of his pain.  We discussed management and work-up.  He is in agreement. [DS]  0277 Reassessed.  Patient reports feeling better.  We discussed cholelithiasis as the likely etiology of his symptoms.  We discussed symptomatic control at home and following up with surgery as an outpatient.  We discussed return precautions for the ED.  Answered questions. [DS]    Clinical Course User Index [DS] Delton Prairie, MD    ____________________________________________   FINAL CLINICAL IMPRESSION(S) / ED DIAGNOSES  Final diagnoses:  RUQ abdominal pain  Calculus of bile duct without cholecystitis and without obstruction     ED Discharge Orders          Ordered    ondansetron (ZOFRAN ODT) 4 MG disintegrating tablet  Every 8 hours PRN        11/04/20 0848    oxyCODONE (ROXICODONE) 5 MG immediate release tablet  Every 8 hours PRN        11/04/20 0848             Katriona Schmierer   Note:  This document was prepared using Dragon voice recognition software and may include unintentional dictation errors.    Delton Prairie, MD 11/04/20 [phone]

## 2020-11-04 NOTE — Discharge Instructions (Addendum)
As we discussed, you have gallstones in your gallbladder likely causing your symptoms.  Please follow-up with a local surgeon in their clinic in the next week or 2 to discuss possibly having your gallbladder removed.  You Zofran as needed for nausea and vomiting.  Use Tylenol for pain and fevers.  Up to 1000 mg per dose, up to 4 times per day.  Do not take more than 4000 mg of Tylenol/acetaminophen within 24 hours..  Use oxycodone as needed on top of this for more severe pain.  Do not mix your Tylenol/codeine with oxycodone.  If you develop any further worsening symptoms, uncontrolled pain, fevers, please return to the ED.

## 2020-11-04 NOTE — ED Triage Notes (Addendum)
Pt comes with c/o upper RQ pain that started at 5am. Pt states some nausea. Pt denies any other new symptoms. Pt states he took all meds this am.  Pt denies any vomiting. Pt states he took tylenol this am for pain with little relief.

## 2020-11-04 NOTE — ED Notes (Signed)
RN to bedside to introduce self to pt.  MD at bedside. Pt CAOx4 and in no acute distress.

## 2020-11-05 ENCOUNTER — Ambulatory Visit: Payer: BC Managed Care – PPO | Admitting: Occupational Therapy

## 2020-11-05 ENCOUNTER — Ambulatory Visit: Payer: BC Managed Care – PPO

## 2020-11-05 DIAGNOSIS — I7774 Dissection of vertebral artery: Secondary | ICD-10-CM | POA: Diagnosis not present

## 2020-11-05 DIAGNOSIS — K8050 Calculus of bile duct without cholangitis or cholecystitis without obstruction: Secondary | ICD-10-CM | POA: Diagnosis not present

## 2020-11-06 ENCOUNTER — Ambulatory Visit: Payer: BC Managed Care – PPO | Admitting: Physical Therapy

## 2020-11-06 ENCOUNTER — Inpatient Hospital Stay: Payer: BC Managed Care – PPO | Admitting: Adult Health

## 2020-11-06 ENCOUNTER — Ambulatory Visit: Payer: BC Managed Care – PPO

## 2020-11-06 ENCOUNTER — Other Ambulatory Visit: Payer: Self-pay

## 2020-11-06 DIAGNOSIS — R262 Difficulty in walking, not elsewhere classified: Secondary | ICD-10-CM | POA: Diagnosis not present

## 2020-11-06 DIAGNOSIS — I7774 Dissection of vertebral artery: Secondary | ICD-10-CM | POA: Diagnosis not present

## 2020-11-06 DIAGNOSIS — R278 Other lack of coordination: Secondary | ICD-10-CM

## 2020-11-06 DIAGNOSIS — H543 Unqualified visual loss, both eyes: Secondary | ICD-10-CM | POA: Diagnosis not present

## 2020-11-06 DIAGNOSIS — R269 Unspecified abnormalities of gait and mobility: Secondary | ICD-10-CM

## 2020-11-06 DIAGNOSIS — R2689 Other abnormalities of gait and mobility: Secondary | ICD-10-CM | POA: Diagnosis not present

## 2020-11-06 DIAGNOSIS — R2681 Unsteadiness on feet: Secondary | ICD-10-CM | POA: Diagnosis not present

## 2020-11-06 DIAGNOSIS — M6281 Muscle weakness (generalized): Secondary | ICD-10-CM

## 2020-11-06 NOTE — Therapy (Signed)
[facility] MAIN [facility] SERVICES [address] [place], [place], [address] Phone: [phone]   Fax:  [phone]  Physical Therapy Treatment  Patient Details  Name: Curtis [name] MRN: [id number] Date of Birth: [birth year] Referring Provider (PT): Alfredo Batty   Encounter Date: 11/06/2020   PT End of Session - 11/06/20 1116     Visit Number 7    Number of Visits 24    Date for PT Re-Evaluation 12/25/20    Authorization Type [employer], no auth req    Authorization Time Period Cert 06/03/50-77/82/42    Progress Note Due on Visit 10    PT Start Time 1100    PT Stop Time 1140    PT Time Calculation (min) 40 min    Equipment Utilized During Treatment Gait belt    Activity Tolerance Patient tolerated treatment well    Behavior During Therapy WFL for tasks assessed/performed             Past Medical History:  Diagnosis Date   Stroke [facility])     No past surgical history on file.  There were no vitals filed for this visit.   Subjective Assessment - 11/06/20 1109     Subjective Pt repots he has been doing well. Has continued progressing at home and has questions regarding carrying his daughter in carseat. Reports she is about 20# with carseat. No questions regarding HEP, reports HEP has been going well.    Pertinent History Curtis [name] is a 35 y.o. M with obesity no other significant PMHx presented to hospital  with acute onset nausea, dizziness. Patient had recently seen a chiropractor and being treated with cervical manipulation.  On the morning of admission he developed sudden dizziness, nausea, neck pain, and headache and so he came to the ER.    CT angiogram was obtained that showed findings consistent with dissection of the left vertebral artery.  MRI brain showed small ischemic infarct in the left lateral medulla. Pt had stay at inpatient rehab and has made significant progress in his funciton since discharge. Pt also has 3 m.o.  daughter he would like to be able to have a larger part in caring for but is restricted due to his impairments. at [facility]E. patient presented to therapy ambulating with rollator.    Limitations Lifting;Walking;Standing    How long can you sit comfortably? no issues with sitting    How long can you stand comfortably? 5-10 min    How long can you walk comfortably? 30 min (at grocery store)    Currently in Pain? No/denies               Treatment provided this session  Therex:  Shoulder girdle and postural muscular strengthening  Prone: Shoulder extension, low row, mid row,   SL:  R:  ER(3#),  Abduction Red thera band L   ER (3#),  Abduction Red thera band-  -cues for thumb up to improve joint space with GH abduction (popping noted prior to cue, popping ceased following correction)          Neuro re-ed :  Lift and carry 20# weighted box  2 x laps from treatment table to mat table and back -pt reports easy, no LOB noted  -Ambulation 20 feet, ascend and descend 4 steps with box and without rails, no LOB, difficulty with descending steps.  -signs of instability noted with activity although no LOB noted   Lifting and carrying 8# box  to  steps 3 x ascending and descending steps,  -1 LOB noted and pt utilized left hand in order to maintain balance (on rail)  Ascending and descending steps without box 3 x 4 steps  -no UE assist, some difficulty with ascending due to small lip on step and pt dragging toe over lip  Hurdle step overs X 10  X 10 while holding box (approximately 8 #) with 2 hands to mimich part to whole practice of carrying DTR in transport carseat -no errors noted   Balance course 1 hurdle, 1/2 foam rollers (x2) for step over, and turn on airex pad x 3 laps through -goal to improve coordination with stepping  -cues to look forward and not at feet in order to simulate practice for carrying objects that limit vision -few minor errors with step  length  completed same course without airex pad carring 8# wooden box in hands to simulate carrying car seat/ child transport device x 4 laps -1 error on lap 3, no other errors or LOB noted -min signs of instability and lacking smooth gait pattern  -pt reports mod difficulty        Pt educated throughout session about proper posture and technique with exercises. Improved exercise technique, movement at target joints, use of target muscles after min to mod verbal, visual, tactile cues.                          PT Education - 11/06/20 1112     Education Details Education regarding lifting form and Administrator) Educated Patient    Methods Explanation;Demonstration;Tactile cues;Verbal cues    Comprehension Verbalized understanding;Returned demonstration;Verbal cues required;Tactile cues required;Need further instruction              PT Short Term Goals - 11/06/20 1104       PT SHORT TERM GOAL #1   Title Patient will be independent in home exercise program to improve strength/mobility for better functional independence with ADLs.    Baseline Pt has HEP from hospital discharge but it is not progressive    Time 4    Period Weeks    Status Achieved    Target Date 10/30/20      PT SHORT TERM GOAL #2   Title Patient (< 78 years old) will complete five times sit to stand test in < 15 seconds indicating an increased LE strength and improved balance.    Baseline >20 sec, 11.6 seconds    Time 4    Period Weeks    Status Achieved    Target Date 10/30/20      PT SHORT TERM GOAL #3   Title Patient will increase six minute walk test distance to >1200 with LRAD for progression to community ambulator and improve gait ability    Baseline 1015 feet with rollator    Time 4    Period Weeks    Status New               PT Long Term Goals - 10/02/20 1724       PT LONG TERM GOAL #1   Title Patient will increase FOTO score to equal to or greater  than 15    to demonstrate statistically significant improvement in mobility and quality of life.    Baseline 63.5    Time 12    Period Weeks    Status New    Target Date 12/25/20  PT LONG TERM GOAL #2   Title Patient will increase Functional Gait Assessment score to >25/30 as to reduce fall risk and improve dynamic gait safety with community ambulation.    Baseline 20    Time 12    Period Weeks    Status New    Target Date 12/25/20      PT LONG TERM GOAL #3   Title Patient will ascend/descend 4 stairs without rail assist independently without loss of balance to improve ability to get in/out of home.    Baseline requires use of handrails to safely naigate stairs    Time 12    Period Weeks    Status New      PT LONG TERM GOAL #4   Title Patient will increase six minute walk test distance to >1300 without AD for progression to community ambulator and improve gait ability    Baseline 1015 with rollator    Time 12    Period Weeks    Target Date 12/25/20                   Plan - 11/06/20 1116     Clinical Impression Statement plan    Personal Factors and Comorbidities Comorbidity 1;Comorbidity 2    Comorbidities obesity, HTN    Examination-Activity Limitations Caring for Others;Carry;Lift;Locomotion Level;Squat;Stairs;Stand    Examination-Participation Restrictions Driving;Occupation;Yard Work    Conservation officer, historic buildings Evolving/Moderate complexity    Rehab Potential Excellent    PT Frequency 2x / week    PT Duration 12 weeks    PT Treatment/Interventions ADLs/Self Care Home Management;Neuromuscular re-education;Patient/family education;Manual techniques;Passive range of motion;Dry needling;Energy conservation;Vestibular;Visual/perceptual remediation/compensation;Therapeutic activities;Functional mobility training;Therapeutic exercise;Balance training;Gait training    PT Next Visit Plan MiniBest or other outcome measure, develop and provide HEP,  consider  use of mirror to assist with  finding neutral    PT Home Exercise Plan Access Code: [email or web address]; 10/31/2020=Access Code: [email or web address]             Patient will benefit from skilled therapeutic intervention in order to improve the following deficits and impairments:  Abnormal gait, Decreased activity tolerance, Decreased balance, Decreased coordination, Decreased endurance, Decreased mobility, Decreased range of motion, Decreased strength, Difficulty walking, Dizziness, Impaired perceived functional ability, Impaired vision/preception  Visit Diagnosis: Abnormality of gait and mobility  Difficulty in walking, not elsewhere classified  Muscle weakness (generalized)  Other abnormalities of gait and mobility  Unsteadiness on feet     Problem List Patient Active Problem List   Diagnosis Date Noted   Thoracic back pain 11/01/2020   Somatic dysfunction of spine, thoracic 11/01/2020   Slow transit constipation    Morbid obesity (HCC)    Neck pain    ASD (atrial septal defect)    Ischemic cerebrovascular accident (CVA) of frontal lobe (HCC) 09/13/2020   Stroke (HCC) 09/10/2020   Lateral medullary syndrome    Vertebral artery dissection (HCC) 09/09/2020   Obesity (BMI 35.0-39.9 without comorbidity) 09/09/2020   Essential hypertension 09/09/2020   Acute stroke of medulla oblongata (HCC)     Norman Herrlich, PT 11/06/2020, 1:00 PM  [place] [facility] MAIN [facility] SERVICES [address] [place], [place], [address] Phone: [phone]   Fax:  [phone]  Name: MUNIR VICTORIAN MRN: [id number] Date of Birth: 1985-04-27

## 2020-11-07 ENCOUNTER — Ambulatory Visit: Payer: BC Managed Care – PPO

## 2020-11-07 NOTE — Therapy (Signed)
[facility] MAIN [facility] SERVICES [address] [place], [place], [address] Phone: [phone]   Fax:  [phone]  Occupational Therapy Treatment  Patient Details  Name: Curtis [name] MRN: [id number] Date of Birth: 1986-01-12 Referring Provider (OT): [name]   Encounter Date: 11/06/2020   OT End of Session - 11/07/20 0825     Visit Number 8    Number of Visits 24    Date for OT Re-Evaluation 12/25/20    Authorization Time Period Progress report period starting 10/02/2020    OT Start Time 1015    OT Stop Time 1100    OT Time Calculation (min) 45 min    Activity Tolerance Patient tolerated treatment well    Behavior During Therapy [facility] for tasks assessed/performed             Past Medical History:  Diagnosis Date   Stroke [facility])     No past surgical history on file.  There were no vitals filed for this visit.   Subjective Assessment - 11/06/20 0823     Subjective  Pt has his neuro opthamalogy appointment next week.    Patient is accompanied by: Family member    Pertinent History Pt. is a 35 y.o. male who was diagnosed with Vertebral Artery Dissection, and Small Acute CVA. Pt. has Dizziness, nausea, diplopia, and fatigue. Pt.'s 1st child was born 3 months ago. Pt. 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    Patient Stated Goals To be able to hold, and feed his daughter, and to be able to play golf again.    Currently in Pain? No/denies    Pain Score 0-No pain    Pain Onset More than a month ago            Occupational Therapy Treatment: Neuro re-ed: Participated in typing activity to facilitate sustained focus of both eyes in R visual field placing paragraph to copy to the R and chin in midline.  Able to complete with min vc to minimize head turns; minimal pausing for repeated blinking to rest eyes; overall better tolerance to tracking in R visual field with fewer eye shifts back to middle.  Addressed visual scanning  with paper/pencil task to find numbers 1-9 on page of disorganized numbers.  1 Vc to identify 1 missed number on the top R of the page.  Practiced larger scale visual scanning with deck of cards spread out on table top, with pt having to numerically organize cards, alternating between L and R visual fields ~2 ft apart.  Educated on various small scale-wide scale visual scanning and saccadic activities to perform at home, including phone activities, reading from coffee table books, computer activities, puzzles, card activities across the table top, and large scale saccadic activities with targets around a room.    Response to Treatment: Pt in the ED this week for gall stones and will have procedure later this Fall/early Winter to take care of these stones.  Pt reports he was given pain medicine prn for gall stones, but has not had to use them.  Pt reports feeling a great deal better, and also notes that he is no longer wearing his eye patch and feels vision is steadily improving.  Pt shows fewer eyes shifts when practicing sustained focus activities in the R upper and R lower quadrants.  Saccades activities are improving, but eyes are still lacking a smooth track from 1 point to the other, but rather circling up toward the  target vs direct line back and forth, also slight undershooting at targets.  Will continue to progress visual motor deficits with skilled OT in order to maximize tolerance to self care, work, leisure activities.     OT Education - 11/06/20 0825     Education Details HEP activities to address saccades and smooth pursuits    Person(s) Educated Patient    Methods Explanation    Comprehension Verbalized understanding;Returned demonstration;Verbal cues required;Need further instruction              OT Short Term Goals - 10/02/20 1650       OT SHORT TERM GOAL #1   Title Pt. will improve FOTO score by 3 points for clinically relevant change during ADLs, and IADL tasks.    Baseline  Eval: FOTO score 63 TR score 75    Time 12    Period Weeks    Status New    Target Date 11/13/20               OT Long Term Goals - 10/02/20 1652       OT LONG TERM GOAL #1   Title Pt. will improve UE strength in order to be able to hold, and feed his baby with modified independence.    Baseline Eval: Pt. is unable to hold, and feed his baby.    Time 12    Period Weeks    Status New    Target Date 12/25/20      OT LONG TERM GOAL #2   Title Pt. will perform light homemaking tasks with modified indepedence in standing    Baseline EVal: Limited standing tolerance during home management tasks.    Time 12    Period Weeks    Status New    Target Date 12/25/20      OT LONG TERM GOAL #3   Title Pt. will independently demonstrate visual compensatory strategies 100% of the time during tabletop ADLs, and IADL tasks.    Baseline Eval: Pt. has difficulty    Time 12    Period Weeks    Status New    Target Date 12/25/20      OT LONG TERM GOAL #4   Title Pt. will demonstrate visual compensatory strategies 100% of the time while performing IADL tasks within his extrpersonal space.    Baseline Eval: Pt. has difficulty    Time 12    Period Weeks    Status New    Target Date 12/25/20             Plan - 11/06/20 0949     Clinical Impression Statement Pt in the ED this week for gall stones and will have procedure later this Fall/early Winter to take care of these stones.  Pt reports he was given pain medicine prn for gall stones, but has not had to use them.  Pt reports feeling a great deal better, and also notes that he is no longer wearing his eye patch and feels vision is steadily improving.  Pt shows fewer eyes shifts when practicing sustained focus activities in the R upper and R lower quadrants.  Saccades activities are improving, but eyes are still lacking a smooth track from 1 point to the other, but rather circling up toward the target vs direct line back and forth, also  slight undershooting at targets.  Will continue to progress visual motor deficits with skilled OT in order to maximize tolerance to self care, work, leisure activities.  OT Occupational Profile and History Detailed Assessment- Review of Records and additional review of physical, cognitive, psychosocial history related to current functional performance    Occupational performance deficits (Please refer to evaluation for details): IADL's;ADL's    Body Structure / Function / Physical Skills ADL;IADL    Rehab Potential Good    Clinical Decision Making Several treatment options, min-mod task modification necessary    Comorbidities Affecting Occupational Performance: May have comorbidities impacting occupational performance    Modification or Assistance to Complete Evaluation  Min-Moderate modification of tasks or assist with assess necessary to complete eval    OT Frequency 3x / week    OT Duration 12 weeks    OT Treatment/Interventions Therapeutic exercise;DME and/or AE instruction;Patient/family education;Therapeutic activities;Self-care/ADL training    Consulted and Agree with Plan of Care Patient             Patient will benefit from skilled therapeutic intervention in order to improve the following deficits and impairments:   Body Structure / Function / Physical Skills: ADL, IADL       Visit Diagnosis: Muscle weakness (generalized)  Other lack of coordination  Low vision, both eyes    Problem List Patient Active Problem List   Diagnosis Date Noted   Thoracic back pain 11/01/2020   Somatic dysfunction of spine, thoracic 11/01/2020   Slow transit constipation    Morbid obesity (HCC)    Neck pain    ASD (atrial septal defect)    Ischemic cerebrovascular accident (CVA) of frontal lobe (HCC) 09/13/2020   Stroke (HCC) 09/10/2020   Lateral medullary syndrome    Vertebral artery dissection (HCC) 09/09/2020   Obesity (BMI 35.0-39.9 without comorbidity) 09/09/2020   Essential  hypertension 09/09/2020   Acute stroke of medulla oblongata (HCC)    Danelle Earthly, MS, OTR/L  Otis Dials, OT/L 11/07/2020, 9:50 AM  [place] [facility] MAIN [facility] SERVICES [address] [place], [place], [address] Phone: [phone]   Fax:  [phone]  Name: Curtis [name] MRN: [id number] Date of Birth: [birth year]

## 2020-11-09 ENCOUNTER — Other Ambulatory Visit: Payer: BC Managed Care – PPO

## 2020-11-11 ENCOUNTER — Encounter: Payer: Self-pay | Admitting: Physical Therapy

## 2020-11-11 ENCOUNTER — Other Ambulatory Visit: Payer: Self-pay

## 2020-11-11 ENCOUNTER — Ambulatory Visit: Payer: BC Managed Care – PPO

## 2020-11-11 ENCOUNTER — Ambulatory Visit: Payer: BC Managed Care – PPO | Admitting: Physical Therapy

## 2020-11-11 DIAGNOSIS — M6281 Muscle weakness (generalized): Secondary | ICD-10-CM

## 2020-11-11 DIAGNOSIS — R262 Difficulty in walking, not elsewhere classified: Secondary | ICD-10-CM

## 2020-11-11 DIAGNOSIS — R2689 Other abnormalities of gait and mobility: Secondary | ICD-10-CM

## 2020-11-11 DIAGNOSIS — R269 Unspecified abnormalities of gait and mobility: Secondary | ICD-10-CM

## 2020-11-11 DIAGNOSIS — R2681 Unsteadiness on feet: Secondary | ICD-10-CM | POA: Diagnosis not present

## 2020-11-11 DIAGNOSIS — R278 Other lack of coordination: Secondary | ICD-10-CM

## 2020-11-11 DIAGNOSIS — H543 Unqualified visual loss, both eyes: Secondary | ICD-10-CM

## 2020-11-11 DIAGNOSIS — I7774 Dissection of vertebral artery: Secondary | ICD-10-CM | POA: Diagnosis not present

## 2020-11-11 NOTE — Patient Instructions (Signed)
Access Code: [email or web address] URL: [email or web address] Date: 11/11/2020 Prepared by: Zettie Pho  Exercises Lunge with Counter Support - 1 x daily - 4 x weekly - 2 sets - 10 reps Single Leg Heel Raise with Counter Support - 1 x daily - 4 x weekly - 2 sets - 10 reps Sit to Stand with Arm Swing - 1 x daily - 4 x weekly - 1 sets - 10 reps

## 2020-11-11 NOTE — Therapy (Signed)
[facility] MAIN [facility] SERVICES [address] [place], [place], [address] Phone: [phone]   Fax:  [phone]  Physical Therapy Treatment  Patient Details  Name: Curtis [name] MRN: [id number] Date of Birth: 03/01/1985 Referring Provider (PT): Alfredo Batty   Encounter Date: 11/11/2020   PT End of Session - 11/11/20 1058     Visit Number 8    Number of Visits 24    Date for PT Re-Evaluation 12/25/20    Authorization Type [employer], no auth req    Authorization Time Period Cert 2/0/25-42/70/62    Progress Note Due on Visit 10    PT Start Time 1146    PT Stop Time 1230    PT Time Calculation (min) 44 min    Equipment Utilized During Treatment Gait belt    Activity Tolerance Patient tolerated treatment well    Behavior During Therapy WFL for tasks assessed/performed             Past Medical History:  Diagnosis Date   Stroke [facility])     History reviewed. No pertinent surgical history.  There were no vitals filed for this visit.   Subjective Assessment - 11/11/20 1148     Subjective Patient reports a little "heaviness/fatigue" in RUE triceps which he feels when he pushes up. Patient reports he noticed it when trying to get up out of the floor with his child. He reports the neck pain is doing well. He is still having vertigo episodes of 15 sec duration, he reports having it daily but mostly at night before laying down;    Pertinent History Curtis [name] is a 35 y.o. M with obesity no other significant PMHx presented to hospital  with acute onset nausea, dizziness. Patient had recently seen a chiropractor and being treated with cervical manipulation.  On the morning of admission he developed sudden dizziness, nausea, neck pain, and headache and so he came to the ER.    CT angiogram was obtained that showed findings consistent with dissection of the left vertebral artery.  MRI brain showed small ischemic infarct in the left lateral  medulla. Pt had stay at inpatient rehab and has made significant progress in his funciton since discharge. Pt also has 3 m.o. daughter he would like to be able to have a larger part in caring for but is restricted due to his impairments. at I.E. patient presented to therapy ambulating with rollator.    Limitations Lifting;Walking;Standing    How long can you sit comfortably? no issues with sitting    How long can you stand comfortably? 5-10 min    How long can you walk comfortably? 30 min (at grocery store)    Currently in Pain? No/denies                       Treatment provided this session   Therex:  Standing with green tband: BUE shoulder low row x20 reps BUE shoulder extension x20 reps;  Standing single UE shoulder ER green tband x15 reps with min VCs to keep elbow by side for better shoulder strengthening;     Facing wall: Green tband BUE Scapular protraction x10 reps  BUE low "V" to high "V" with green tband resistance x10 reps;   Seated: Thoracic extension against 1/2 bolster for upper back stretch x10 reps AROM;     Advanced HEP: -Single leg heel raise x5 reps each foot with rail assist for balance -forward lunge on firm  surface with cues to use counter support for safety -sit<>stand with 10# arm lift x10 reps; Patient required min VCs to avoid overhead lift for less strain on shoulder;   Neuro re-ed :   Lift and carry 20# weighted box (Box plus 10# weight) Around gym x80 feet -pt reports easy, no LOB noted  -Ambulation 20 feet, ascend and descend 4 steps with box and without rails (x2 sets, initially ascend non-reciprocal, progressed to reciprocal ascend and descend), no LOB, difficulty with descending steps.  -signs of instability noted with activity although no LOB noted; Required CGA for safety;   Stepping forward/backward airex to airex with orange hurdle in the middle for obstacle clearance x10 reps with 1-0 rail assist;   Forward/backward step with  BUE overhead reach without hurdle x5 reps, required min A for safety with increased ankle instability noted;  Forward lunge airex to airex with rail assist as needed, slow controlled motion x5 reps each foot in front; Patient initially exhibits decreased motor control with poor placement of foot and increased eversion/inversion of foot; With increased repetition and visual cues better motor control noted;           Pt educated throughout session about proper posture and technique with exercises. Improved exercise technique, movement at target joints, use of target muscles after min to mod verbal, visual, tactile cues.    Tolerated session well. Patient denies any increase in pain or symptoms with advanced exercise;                      PT Education - 11/11/20 1453     Education Details exercise technique/positioning;    Person(s) Educated Patient    Methods Explanation;Verbal cues    Comprehension Verbalized understanding;Returned demonstration;Verbal cues required;Need further instruction              PT Short Term Goals - 11/06/20 1104       PT SHORT TERM GOAL #1   Title Patient will be independent in home exercise program to improve strength/mobility for better functional independence with ADLs.    Baseline Pt has HEP from hospital discharge but it is not progressive    Time 4    Period Weeks    Status Achieved    Target Date 10/30/20      PT SHORT TERM GOAL #2   Title Patient (< 55 years old) will complete five times sit to stand test in < 15 seconds indicating an increased LE strength and improved balance.    Baseline >20 sec, 11.6 seconds    Time 4    Period Weeks    Status Achieved    Target Date 10/30/20      PT SHORT TERM GOAL #3   Title Patient will increase six minute walk test distance to >1200 with LRAD for progression to community ambulator and improve gait ability    Baseline 1015 feet with rollator    Time 4    Period Weeks    Status  New               PT Long Term Goals - 10/02/20 1724       PT LONG TERM GOAL #1   Title Patient will increase FOTO score to equal to or greater than 15    to demonstrate statistically significant improvement in mobility and quality of life.    Baseline 63.5    Time 12    Period Weeks    Status New  Target Date 12/25/20      PT LONG TERM GOAL #2   Title Patient will increase Functional Gait Assessment score to >25/30 as to reduce fall risk and improve dynamic gait safety with community ambulation.    Baseline 20    Time 12    Period Weeks    Status New    Target Date 12/25/20      PT LONG TERM GOAL #3   Title Patient will ascend/descend 4 stairs without rail assist independently without loss of balance to improve ability to get in/out of home.    Baseline requires use of handrails to safely naigate stairs    Time 12    Period Weeks    Status New      PT LONG TERM GOAL #4   Title Patient will increase six minute walk test distance to >1300 without AD for progression to community ambulator and improve gait ability    Baseline 1015 with rollator    Time 12    Period Weeks    Target Date 12/25/20                   Plan - 11/11/20 1153     Clinical Impression Statement Patient motivated and participated well within session. He was instructed in advanced scapular strengthening. Patient does require min VCs for proper exercise technique and positioning. He was able to lift/carry 10+# on level surface and up/down stairs. He does require CGA with step negotiation for safety with slight unsteadiness especially when descending steps. Patient instructed in advanced balance tasks. He does exhibit increased ankle instability while on unstable surface. Instructed patient in advanced strengthening for HEP to improve overall mobility. Patient would benefit from additional skilled PT Intervention to improve strength, balance and mobility; Will reach out to Vestibular therapist  to see if patient would be appropriate for BPPV assessment as he is complaining of increased vertigo symptoms daily.    Personal Factors and Comorbidities Comorbidity 1;Comorbidity 2    Comorbidities obesity, HTN    Examination-Activity Limitations Caring for Others;Carry;Lift;Locomotion Level;Squat;Stairs;Stand    Examination-Participation Restrictions Driving;Occupation;Yard Work    Conservation officer, historic buildings Evolving/Moderate complexity    Rehab Potential Excellent    PT Frequency 2x / week    PT Duration 12 weeks    PT Treatment/Interventions ADLs/Self Care Home Management;Neuromuscular re-education;Patient/family education;Manual techniques;Passive range of motion;Dry needling;Energy conservation;Vestibular;Visual/perceptual remediation/compensation;Therapeutic activities;Functional mobility training;Therapeutic exercise;Balance training;Gait training    PT Next Visit Plan MiniBest or other outcome measure, develop and provide HEP,  consider use of mirror to assist with  finding neutral    PT Home Exercise Plan Access Code: [email or web address]; 10/31/2020=Access Code: [email or web address]             Patient will benefit from skilled therapeutic intervention in order to improve the following deficits and impairments:  Abnormal gait, Decreased activity tolerance, Decreased balance, Decreased coordination, Decreased endurance, Decreased mobility, Decreased range of motion, Decreased strength, Difficulty walking, Dizziness, Impaired perceived functional ability, Impaired vision/preception  Visit Diagnosis: Abnormality of gait and mobility  Difficulty in walking, not elsewhere classified  Muscle weakness (generalized)  Other abnormalities of gait and mobility  Unsteadiness on feet  Other lack of coordination     Problem List Patient Active Problem List   Diagnosis Date Noted   Thoracic back pain 11/01/2020   Somatic dysfunction of spine, thoracic 11/01/2020   Slow transit constipation     Morbid obesity (HCC)    Neck pain    ASD (atrial septal  defect)    Ischemic cerebrovascular accident (CVA) of frontal lobe (HCC) 09/13/2020   Stroke (HCC) 09/10/2020   Lateral medullary syndrome    Vertebral artery dissection (HCC) 09/09/2020   Obesity (BMI 35.0-39.9 without comorbidity) 09/09/2020   Essential hypertension 09/09/2020   Acute stroke of medulla oblongata (HCC)     Jasreet Dickie, PT, DPT 11/11/2020, 3:09 PM  [place] [facility] MAIN [facility] SERVICES [address] [place], [place], [address] Phone: [phone]   Fax:  [phone]  Name: Curtis [name] MRN: [id number] Date of Birth: 1985-04-06

## 2020-11-11 NOTE — Therapy (Signed)
[place] [facility] MAIN [facility] SERVICES [address] [place], [place], [address] Phone: [phone]   Fax:  [phone]  Occupational Therapy Treatment  Patient Details  Name: Curtis [name] MRN: [id number] Date of Birth: November 15, 1985 Referring Provider (OT): [name]   Encounter Date: 11/11/2020   OT End of Session - 11/11/20 1131     Visit Number 9    Number of Visits 24    Date for OT Re-Evaluation 12/25/20    Authorization Time Period Progress report period starting 10/02/2020    OT Start Time 1100    OT Stop Time 1143    OT Time Calculation (min) 43 min    Activity Tolerance Patient tolerated treatment well    Behavior During Therapy [facility] for tasks assessed/performed             Past Medical History:  Diagnosis Date   Stroke [facility])     No past surgical history on file.  There were no vitals filed for this visit.   Subjective Assessment - 11/11/20 1130     Subjective  "I go on Thurs to my eye appointment."    Patient is accompanied by: Family member    Pertinent History Pt. is a 35 y.o. male who was diagnosed with Vertebral Artery Dissection, and Small Acute CVA. Pt. has Dizziness, nausea, diplopia, and fatigue. Pt.'s 1st child was born 3 months ago. Pt. 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    Patient Stated Goals To be able to hold, and feed his daughter, and to be able to play golf again.    Currently in Pain? No/denies    Pain Score 0-No pain    Pain Onset More than a month ago    Multiple Pain Sites No            Occupational Therapy Treatment: Therapeutic Exercise: Participation in visuomotor exercises; Participated in typing activity to facilitate sustained focus of both eyes in R visual field placing paragraph to copy to the R lower and R upper quadrants, chin in midline.  Able to complete with intermittent head turns; minimal pausing for repeated blinking to rest eyes; overall better tolerance to tracking in  R visual field lower quadrant, more challenging in R upper quadrant.  Completed hidden picture activities with min vc to utilize organized scanning patterns; able to locate ~50% of pictures indep, ~50% with min vc.  Focus on saccades with sorting deck of cards in L and R visual fields; minimal difficulty, minimal visual fatigue noted or observed.   Response to Treatment: See Plan/clinical impression below.   OT Education - 11/11/20 1131     Education Details HEP progression    Person(s) Educated Patient    Methods Explanation    Comprehension Verbalized understanding;Returned demonstration;Verbal cues required              OT Short Term Goals - 10/02/20 1650       OT SHORT TERM GOAL #1   Title Pt. will improve FOTO score by 3 points for clinically relevant change during ADLs, and IADL tasks.    Baseline Eval: FOTO score 63 TR score 75    Time 12    Period Weeks    Status New    Target Date 11/13/20               OT Long Term Goals - 10/02/20 1652       OT LONG TERM GOAL #1   Title Pt. will  improve UE strength in order to be able to hold, and feed his baby with modified independence.    Baseline Eval: Pt. is unable to hold, and feed his baby.    Time 12    Period Weeks    Status New    Target Date 12/25/20      OT LONG TERM GOAL #2   Title Pt. will perform light homemaking tasks with modified indepedence in standing    Baseline EVal: Limited standing tolerance during home management tasks.    Time 12    Period Weeks    Status New    Target Date 12/25/20      OT LONG TERM GOAL #3   Title Pt. will independently demonstrate visual compensatory strategies 100% of the time during tabletop ADLs, and IADL tasks.    Baseline Eval: Pt. has difficulty    Time 12    Period Weeks    Status New    Target Date 12/25/20      OT LONG TERM GOAL #4   Title Pt. will demonstrate visual compensatory strategies 100% of the time while performing IADL tasks within his  extrpersonal space.    Baseline Eval: Pt. has difficulty    Time 12    Period Weeks    Status New    Target Date 12/25/20                Plan - 11/11/20 1531     Clinical Impression Statement Pt reports that he bought a word search and has been using at home.  Pt reported visual fatigue this weekend when watching 3 hours of football on tv, having to compensate with head positioning to comfortably view tv (still fatigue when viewing objects in R upper quadrant of visual field).  Pt reports continued difficulty searching for items in the grocery store d/t increased visual stimulation, but overall pt reports he sees improvements and continues to be able to tolerate his day without his eye patch.  Pt is showing improved sustained focus when viewing objects in R lower quadrant, more fatigue noted when sustaining focus in R upper quadrant, or when tracking on a larger scare (table top or within a room or store).  Pt will continue to benefit from skilled OT for improving visual motor control for improving tolerance to ADLs/IADLs/leisure activities.    OT Occupational Profile and History Detailed Assessment- Review of Records and additional review of physical, cognitive, psychosocial history related to current functional performance    Occupational performance deficits (Please refer to evaluation for details): IADL's;ADL's    Body Structure / Function / Physical Skills ADL;IADL    Rehab Potential Good    Clinical Decision Making Several treatment options, min-mod task modification necessary    Comorbidities Affecting Occupational Performance: May have comorbidities impacting occupational performance    Modification or Assistance to Complete Evaluation  Min-Moderate modification of tasks or assist with assess necessary to complete eval    OT Frequency 3x / week    OT Duration 12 weeks    OT Treatment/Interventions Therapeutic exercise;DME and/or AE instruction;Patient/family education;Therapeutic  activities;Self-care/ADL training    Consulted and Agree with Plan of Care Patient             Patient will benefit from skilled therapeutic intervention in order to improve the following deficits and impairments:   Body Structure / Function / Physical Skills: ADL, IADL       Visit Diagnosis: Muscle weakness (generalized)  Other lack of coordination  Low vision, both eyes    Problem List Patient Active Problem List   Diagnosis Date Noted   Thoracic back pain 11/01/2020   Somatic dysfunction of spine, thoracic 11/01/2020   Slow transit constipation    Morbid obesity (HCC)    Neck pain    ASD (atrial septal defect)    Ischemic cerebrovascular accident (CVA) of frontal lobe (HCC) 09/13/2020   Stroke (HCC) 09/10/2020   Lateral medullary syndrome    Vertebral artery dissection (HCC) 09/09/2020   Obesity (BMI 35.0-39.9 without comorbidity) 09/09/2020   Essential hypertension 09/09/2020   Acute stroke of medulla oblongata (HCC)    Danelle Earthly, MS, OTR/L  [employer] Dials, OT/L 11/11/2020, 3:33 PM  [place] [facility] MAIN [facility] SERVICES [address] [place], [place], [address] Phone: [phone]   Fax:  [phone]  Name: ERVAN HEBER MRN: [id number] Date of Birth: 1985-06-24

## 2020-11-12 ENCOUNTER — Encounter: Payer: Self-pay | Admitting: Physical Therapy

## 2020-11-12 ENCOUNTER — Ambulatory Visit
Admission: RE | Admit: 2020-11-12 | Discharge: 2020-11-12 | Disposition: A | Discharge status: Home / Self Care | Payer: BC Managed Care – PPO | Source: Ambulatory Visit | Attending: Physical Medicine and Rehabilitation | Admitting: Physical Medicine and Rehabilitation

## 2020-11-12 ENCOUNTER — Other Ambulatory Visit: Payer: Self-pay | Admitting: Adult Health

## 2020-11-12 DIAGNOSIS — Z8673 Personal history of transient ischemic attack (TIA), and cerebral infarction without residual deficits: Secondary | ICD-10-CM | POA: Diagnosis not present

## 2020-11-12 DIAGNOSIS — M542 Cervicalgia: Secondary | ICD-10-CM | POA: Diagnosis not present

## 2020-11-12 DIAGNOSIS — R200 Anesthesia of skin: Secondary | ICD-10-CM

## 2020-11-12 DIAGNOSIS — I7774 Dissection of vertebral artery: Secondary | ICD-10-CM

## 2020-11-12 DIAGNOSIS — J329 Chronic sinusitis, unspecified: Secondary | ICD-10-CM | POA: Diagnosis not present

## 2020-11-12 DIAGNOSIS — R202 Paresthesia of skin: Secondary | ICD-10-CM

## 2020-11-12 DIAGNOSIS — H532 Diplopia: Secondary | ICD-10-CM | POA: Diagnosis not present

## 2020-11-12 IMAGING — MR MR CERVICAL SPINE W/O CM
4 of 5 series · 29 of 48 positions shown · non-contrast
Comparison: Cervical spine MRI [DATE].

CLINICAL DATA: Numbness and tingling in both hands. Neck pain
radiating into both hands. Additional history provided by scanning
technologist: Patient reports stroke [DATE], nausea, difficulty
walking, bilateral eye blurriness, bilateral hand tingling/numbness.

EXAM:
MRI CERVICAL SPINE WITHOUT CONTRAST
TECHNIQUE: Multiplanar, multisequence MR imaging of the cervical spine was
performed. No intravenous contrast was administered.

[Series 3: T2 · sagittal · 3.0mm · 0.75mm/px · 8 of 17 slices shown (1 of 2)]
[im 1/17]
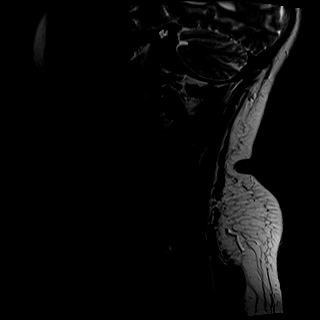
[im 3/17]
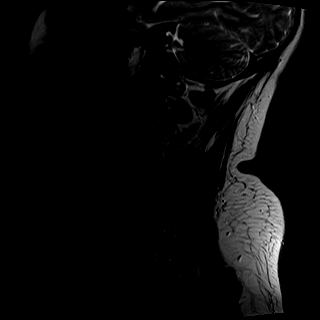
[im 5/17]
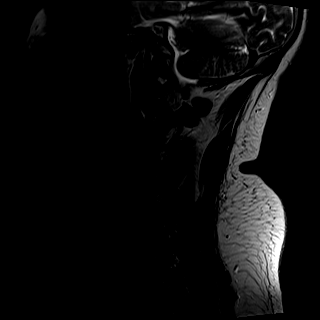
[im 7/17]
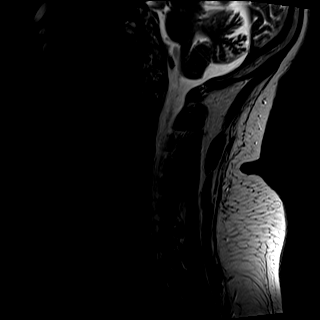
[im 10/17]
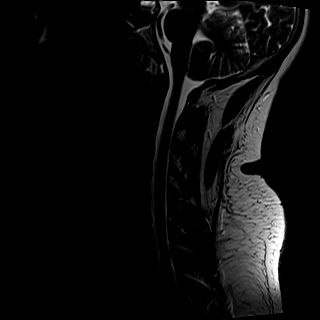
[im 12/17]
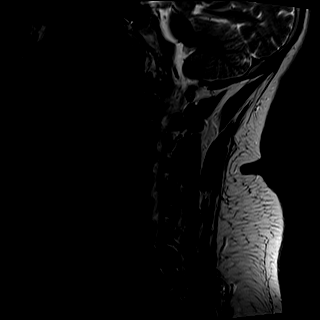
[im 14/17]
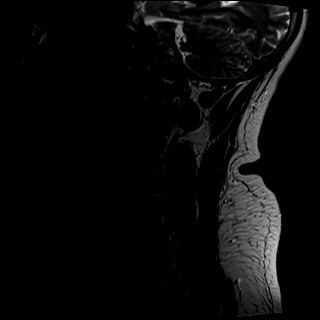
[im 17/17]
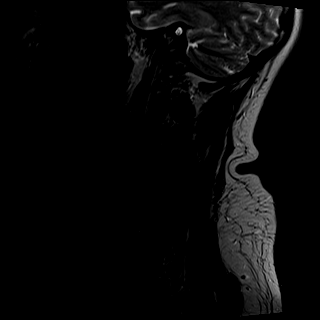

[Series 4: T1 · sagittal · 3.0mm · 0.47mm/px · 7 of 17 slices shown]
[im 1/17]
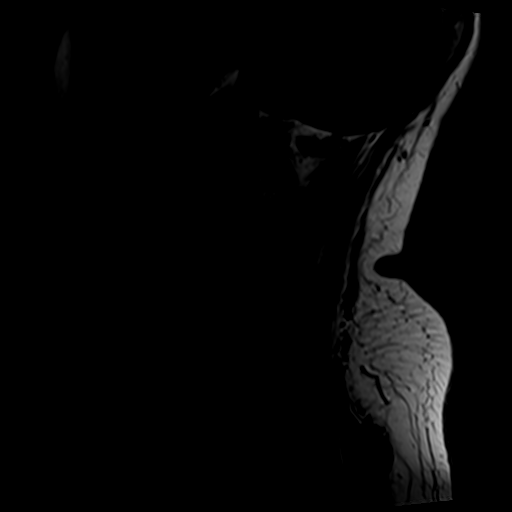
[im 3/17]
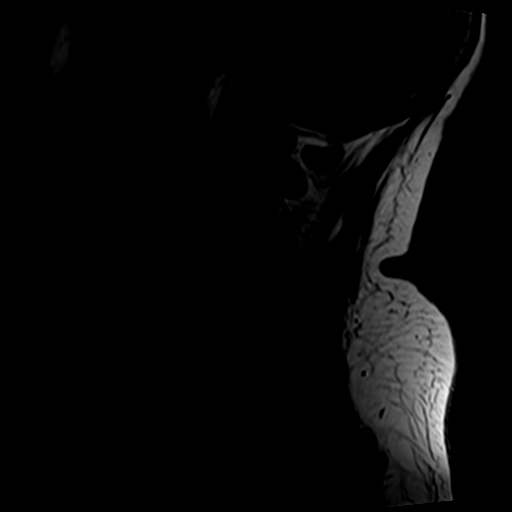
[im 6/17]
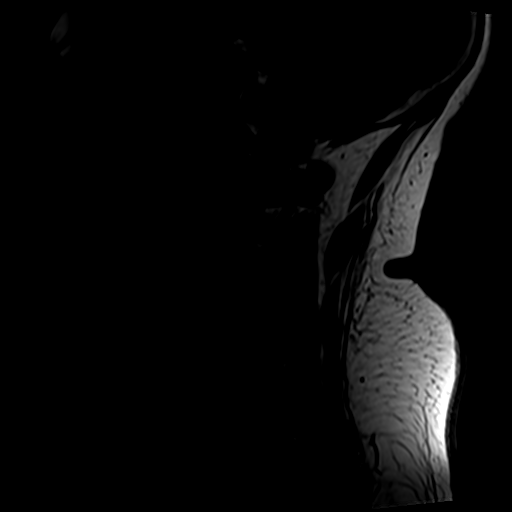
[im 9/17]
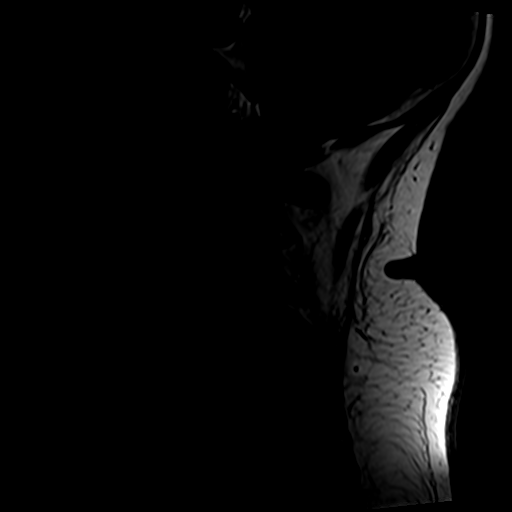
[im 11/17]
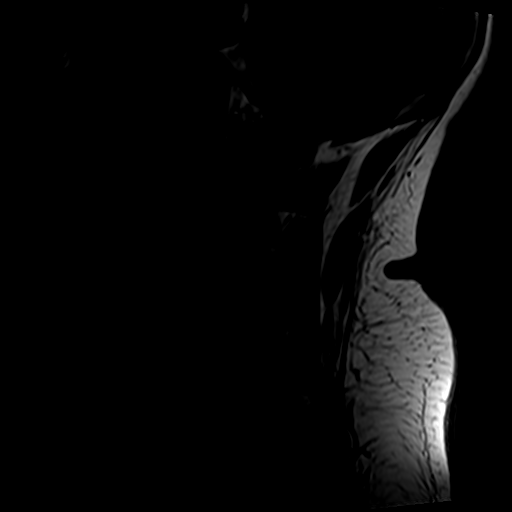
[im 14/17]
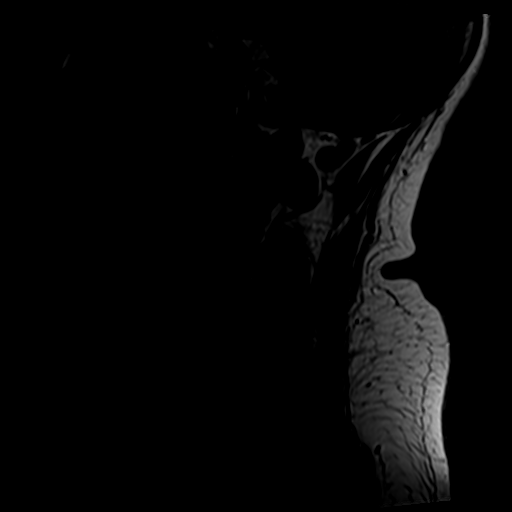
[im 17/17]
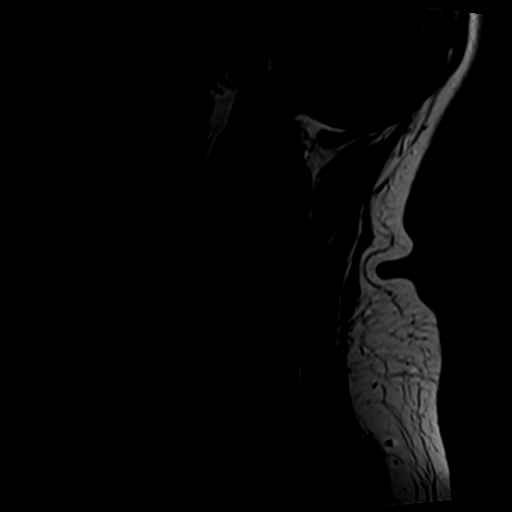

[Series 5: tir sag · sagittal · 3.0mm · 0.47mm/px · 5 of 17 slices shown]
[im 1/17]
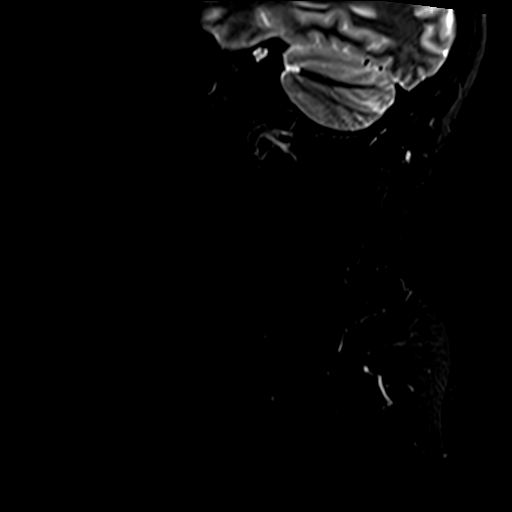
[im 3/17]
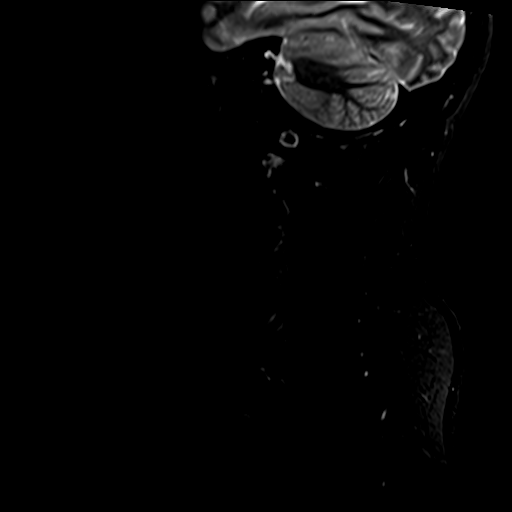
[im 6/17]
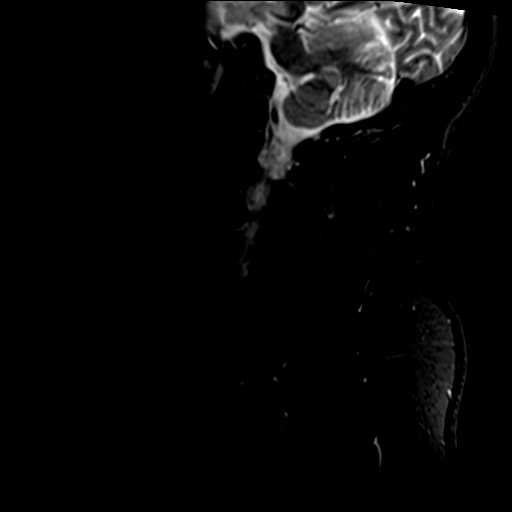
[im 9/17]
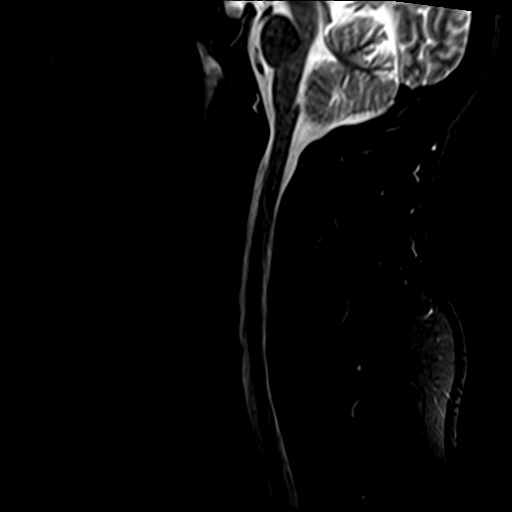
[im 14/17]
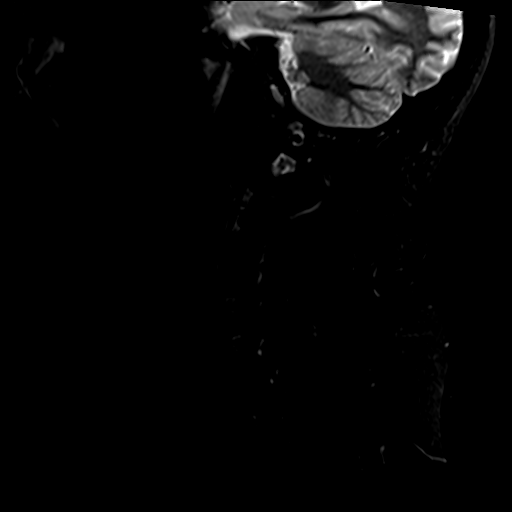

[Series 7: T2 · axial · 3.0mm · 0.70mm/px · z∈[-246,-133]mm · 9 of 31 slices shown (2 of 2)]
[im 1/31]
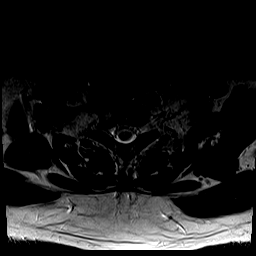
[im 6/31]
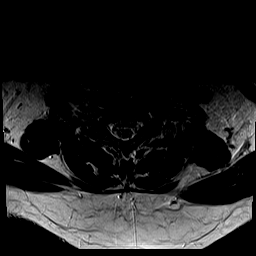
[im 11/31]
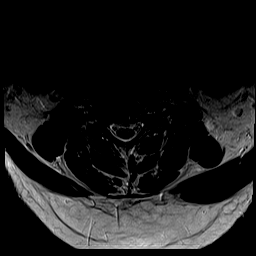
[im 13/31]
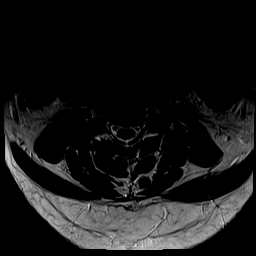
[im 16/31]
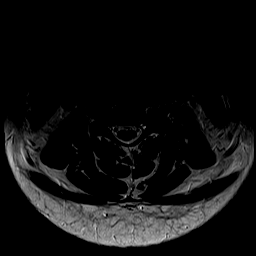
[im 18/31]
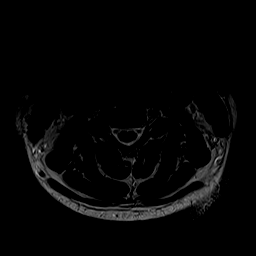
[im 21/31]
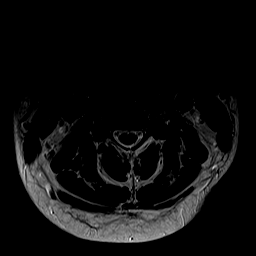
[im 26/31]
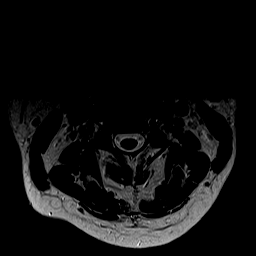
[im 31/31]
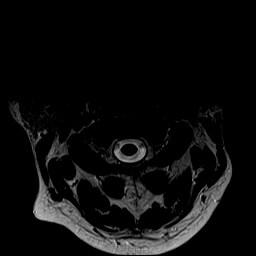

[29 of 48 positions shown; findings below may reference images not displayed]

FINDINGS: The examination is intermittently motion degraded, limiting
evaluation. Most notably, there is moderate motion degradation of
the sagittal T1 weighted sequence and sagittal STIR sequence, and
mild-to-moderate motion degradation of the axial T2 weighted
imaging.

Alignment: Straightening of the expected cervical lordosis. No
significant spondylolisthesis.

Vertebrae: Vertebral body height is maintained. Redemonstrated small
hemangioma within the right aspect of the T1 vertebral body. No
significant marrow edema or focal suspicious osseous lesion.

Cord: Within the limitations of motion degradation, no signal
abnormality is identified within the cervical spinal cord.

Posterior Fossa, vertebral arteries, paraspinal tissues: Posterior
fossa better assessed on same-day brain MRI. Flow voids maintained
within the imaged cervical vertebral arteries. Paraspinal soft
tissues unremarkable.

Disc levels:

Unless otherwise stated, the level by level findings below have not
significantly changed from the prior MRI of [DATE].

No more than mild disc degeneration at any level.

C2-C3: No significant disc herniation or stenosis.

C3-C4: No significant disc herniation or stenosis.

C4-C5: Minimal facet arthrosis (greater on the right). No
significant disc herniation or stenosis.

C5-C6: Shallow disc bulge. Mild uncovertebral hypertrophy on the
left. Minimal facet arthrosis (greater on the left). No significant
spinal canal stenosis. Mild left neural foraminal narrowing.

C6-C7: Small central disc protrusion. Mild facet arthrosis (greater
on the left). The disc protrusion results in mild focal effacement
of the ventral thecal sac (without spinal cord mass effect). No
significant foraminal stenosis.

C7-T1: Mild facet arthrosis (predominantly on the left). No
significant disc herniation or stenosis.
IMPRESSION: Motion degraded exam, as described.

Cervical spondylosis, as outlined and unchanged from the cervical
spine MRI of [DATE]. At C6-C7, a small central disc protrusion
results in mild focal effacement of the ventral thecal sac, without
spinal cord mass effect. Multifactorial mild neural foraminal
narrowing on the left at C5-C6.

Straightening of the expected cervical lordosis.

## 2020-11-12 NOTE — Therapy (Signed)
[facility] MAIN [facility] SERVICES [address] [place], [place], [address] Phone: [phone]   Fax:  [phone]  Patient Details  Name: Curtis [name] MRN: [id number] Date of Birth: [birth year] Referring Provider: Dr. Cleatrice Burke PA   Patient reports continued vertigo symptoms (room spinning) lasting approximately 15 sec at a time, occurring daily, usually at night as he is getting ready for bed.  PMH significant: vertebral artery dissection (Aug 15th) after visiting a chiropractor  PT wanted to reach out to physician to obtain clearance to assess patient for BPPV. Given precautions of Vertebral Artery Dissection include to avoid/limit cervical extension and rotation we would likely use a tilt table and have patient in sidelying to minimize these positions.   Please advise if you are okay with this treatment or if you would prefer we avoid assessment for BPPV given patient's medical history and precautions.   Please let us know if this patient has any other precautions we need to be aware of before assessing vestibular impairments.   Encounter Date: 11/12/2020   Xavion Muscat PT, DPT 11/12/2020, 9:03 AM  [facility] MAIN [facility] SERVICES [address] [place], [place], [address] Phone: [phone]   Fax:  [phone]

## 2020-11-13 ENCOUNTER — Ambulatory Visit: Payer: BC Managed Care – PPO

## 2020-11-13 ENCOUNTER — Encounter: Payer: Self-pay | Admitting: Occupational Therapy

## 2020-11-13 ENCOUNTER — Ambulatory Visit: Payer: BC Managed Care – PPO | Admitting: Occupational Therapy

## 2020-11-13 ENCOUNTER — Other Ambulatory Visit: Payer: Self-pay

## 2020-11-13 DIAGNOSIS — M6281 Muscle weakness (generalized): Secondary | ICD-10-CM | POA: Diagnosis not present

## 2020-11-13 DIAGNOSIS — R2681 Unsteadiness on feet: Secondary | ICD-10-CM

## 2020-11-13 DIAGNOSIS — R262 Difficulty in walking, not elsewhere classified: Secondary | ICD-10-CM

## 2020-11-13 DIAGNOSIS — H543 Unqualified visual loss, both eyes: Secondary | ICD-10-CM | POA: Diagnosis not present

## 2020-11-13 DIAGNOSIS — R269 Unspecified abnormalities of gait and mobility: Secondary | ICD-10-CM

## 2020-11-13 DIAGNOSIS — R278 Other lack of coordination: Secondary | ICD-10-CM | POA: Diagnosis not present

## 2020-11-13 DIAGNOSIS — I7774 Dissection of vertebral artery: Secondary | ICD-10-CM | POA: Diagnosis not present

## 2020-11-13 DIAGNOSIS — R2689 Other abnormalities of gait and mobility: Secondary | ICD-10-CM | POA: Diagnosis not present

## 2020-11-13 NOTE — Therapy (Signed)
[place] MAIN [facility] SERVICES [address] [place], [place], [address] Phone: [phone]   Fax:  [phone]  Occupational Therapy Progress Note  Dates of reporting period  10/02/2020   to   11/13/2020   Patient Details  Name: Curtis [name] MRN: [id number] Date of Birth: Aug 24, 1985 Referring Provider (OT): [place]   Encounter Date: 11/13/2020   OT End of Session - 11/13/20 1524     Visit Number 10    Number of Visits 24    Date for OT Re-Evaluation 12/25/20    Authorization Time Period Progress report period starting 10/02/2020    OT Start Time 1100    OT Stop Time 1145    OT Time Calculation (min) 45 min    Activity Tolerance Patient tolerated treatment well    Behavior During Therapy [facility] for tasks assessed/performed             Past Medical History:  Diagnosis Date   Stroke [facility])     History reviewed. No pertinent surgical history.  There were no vitals filed for this visit.   Subjective Assessment - 11/13/20 1523     Subjective  Pt. reports having an eye appointment tomorrow    Patient is accompanied by: Family member    Pertinent History Pt. is a 35 y.o. male who was diagnosed with Vertebral Artery Dissection, and Small Acute CVA. Pt. has Dizziness, nausea, diplopia, and fatigue. Pt.'s 1st child was born 3 months ago. Pt. 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    Currently in Pain? No/denies                [facility] OT Assessment - 11/13/20 1158       Coordination   Left 9 Hole Peg Test 22      Hand Function   Right Hand Grip (lbs) 94    Right Hand Lateral Pinch 25 lbs    Right Hand 3 Point Pinch 26 lbs    Left Hand Grip (lbs) 82    Left Hand Lateral Pinch 29 lbs    Left 3 point pinch 27 lbs              OT TREATMENT     Measurements were obtained and goals were reviewed with the pt. Pt. has met most established goals, and is now appropriate to transition to 1x week. Pt. Has progressed  with LUE functioning, left grip strength, pinch strength, and [place] skills. Pt. is now able to hold, feed, and burp his daughter. Pt. Is able to perform IADL home management tasks at home. FOTO score is 76 with TR score of 75. Pt. had a follow-up appointment with his Physician last week. Pt. reports that they recommended to have the patch removed at all times now. Pt. reports they are scheduling him for an appointment with a neuro-ophthalmologist. Pt. was able to complete visual saccades activities at the vertical whiteboard. Pt. worked on scanning left to right, as well as scanning through all quadrants. Pt. worked on saccades, and visual scanning 2 rows of letters with progressively increasing scanning plane. Pt. worked on the accuracy with remaining in the lines when following designs on a vertical whiteboard. Pt. continues to work on improving visual compensatory strategies in order to work towards improving, and maximizing independence with ADLs, and IADLs.                     OT Education - 11/13/20 1524  Education Details HEP progression    Person(s) Educated Patient    Comprehension Verbalized understanding;Returned demonstration;Verbal cues required              OT Short Term Goals - 11/13/20 1525       OT SHORT TERM GOAL #1   Title Pt. will improve FOTO score by 3 points for clinically relevant change during ADLs, and IADL tasks.    Baseline 10th visit: FOTO: 76, TR 75 Eval: FOTO score 63 TR score 75    Time 12    Period Weeks    Status Achieved               OT Long Term Goals - 11/13/20 1525       OT LONG TERM GOAL #1   Title Pt. will improve UE strength in order to be able to hold, and feed his baby with modified independence.    Baseline 10th visit: Pt. is able to hold, feed, and burp his daughter from a sitting posiiton.Eval: Pt. is unable to hold, and feed his baby.    Time 12    Period Weeks      OT LONG TERM GOAL #2   Title Pt. will perform  light homemaking tasks with modified indepedence in standing    Baseline 10th: Pt. is able to perform laundry, dishes, making the bed, cooking, cleaning.EVal: Limited standing tolerance during home management tasks.    Time 12    Period Weeks    Status Achieved      OT LONG TERM GOAL #3   Title Pt. will independently demonstrate visual compensatory strategies 100% of the time during tabletop ADLs, and IADL tasks.    Baseline 10th visit: Pt. is improving with utilizing visual compensatory strategies  during tabletop tasks. Eval: Pt. has difficulty    Time 12    Period Weeks    Status On-going    Target Date 12/25/20      OT LONG TERM GOAL #4   Title Pt. will demonstrate visual compensatory strategies 100% of the time while performing IADL tasks within his extrpersonal space.    Baseline 10th visit: Pt. is improving with utilizing visual compensatory strategies when navigating through his environment. Eval: Pt. has difficulty    Time 12    Period Weeks    Status On-going    Target Date 12/25/20                   Plan - 11/13/20 1524     Clinical Impression Statement MMeasurements were obtained and goals were reviewed with the pt. Pt. has met most established goals, and is now appropriate to transition to 1x week. Pt. Has progressed with LUE functioning, left grip strength, pinch strength, and [place] skills. Pt. is now able to hold, feed, and burp his daughter. Pt. Is able to perform IADL home management tasks at home. FOTO score is 76 with TR score of 75. Pt. had a follow-up appointment with his Physician last week. Pt. reports that they recommended to have the patch removed at all times now. Pt. reports they are scheduling him for an appointment with a neuro-ophthalmologist. Pt. was able to complete visual saccades activities at the vertical whiteboard. Pt. worked on scanning left to right, as well as scanning through all quadrants. Pt. worked on saccades, and visual scanning 2 rows of  letters with progressively increasing scanning plane. Pt. worked on the accuracy with remaining in the lines when following designs on a  vertical whiteboard. Pt. continues to work on improving visual compensatory strategies in order to work towards improving, and maximizing independence with ADLs, and IADLs.       OT Occupational Profile and History Detailed Assessment- Review of Records and additional review of physical, cognitive, psychosocial history related to current functional performance    Occupational performance deficits (Please refer to evaluation for details): IADL's;ADL's    Body Structure / Function / Physical Skills ADL;IADL    Rehab Potential Good    Clinical Decision Making Several treatment options, min-mod task modification necessary    Comorbidities Affecting Occupational Performance: May have comorbidities impacting occupational performance    Modification or Assistance to Complete Evaluation  Min-Moderate modification of tasks or assist with assess necessary to complete eval    OT Frequency 3x / week    OT Duration 12 weeks    OT Treatment/Interventions Therapeutic exercise;DME and/or AE instruction;Patient/family education;Therapeutic activities;Self-care/ADL training    Consulted and Agree with Plan of Care Patient             Patient will benefit from skilled therapeutic intervention in order to improve the following deficits and impairments:   Body Structure / Function / Physical Skills: ADL, IADL       Visit Diagnosis: Muscle weakness (generalized)    Problem List Patient Active Problem List   Diagnosis Date Noted   Thoracic back pain 11/01/2020   Somatic dysfunction of spine, thoracic 11/01/2020   Slow transit constipation    Morbid obesity ([place])    Neck pain    ASD (atrial septal defect)    Ischemic cerebrovascular accident (CVA) of frontal lobe ([place]) 09/13/2020   Stroke ([place]) 09/10/2020   Lateral medullary syndrome    Vertebral artery  dissection ([place]) 09/09/2020   Obesity (BMI 35.0-39.9 without comorbidity) 09/09/2020   Essential hypertension 09/09/2020   Acute stroke of medulla oblongata ([place])     Harrel Carina, MS, OTR/L 11/13/2020, 3:30 PM  [place] [address] [place], [place], [address] Phone: [phone]   Fax:  [phone]  Name: AFNAN EMBERTON MRN: [id number] Date of Birth: [birth year]

## 2020-11-13 NOTE — Progress Notes (Signed)
BP 127/83   Pulse 72   Temp 98.7 F (37.1 C) (Oral)   Ht 6' (1.829 m)   Wt (!) 311 lb 9.6 oz (141.3 kg)   SpO2 98%   BMI 42.26 kg/m    Subjective:    Patient ID: Curtis [name], male    DOB: [birth year], 35 y.o.   MRN: [id number]  HPI: Curtis [name] is a 35 y.o. male  Chief Complaint  Patient presents with   Cerebrovascular Accident    Patient states he was hospitalized on 09/09/20 for a Stroke. Patient states he would like to discuss the general progression of the stroke since hospitalization. Patient states things are getting better for sure.    Presented to [facility] 09/09/2020 left-sided numbness headache dizziness as well as nausea x1 week.  Patient had been seeing the MRI of the brain showed a small acute stroke of the lateral left medulla.  CTA head neck irregularity of the V2 segment of the left vertebral artery with sites of up to mild to moderate stenosis.  Irregularity of the V4 left vertebral artery with sites of up to moderate/severe stenosis.  MRI of the brain follow-up more conspicuous signal abnormality in the left medulla that could possibly evolutionary change of previous stroke.  Neurology follow-up maintained on aspirin and Plavix x3 months.  Patient would require repeat vascular imaging in 3 months to determine need to continue DAPT.   Patient has been doing PT and OT to 2x weekly.  Yesterday they cut his OT back to 1x weekly.  Patient states the weakness has improved significantly since doing OT.  Patient states it feels like their is a layer of something over his skin.  He can't completely feel things. Patient states he is still having dizziness.  He is taking Meclizine 50mg  TID.  Feels like it is related his vision.  Does have an ophthalmology appointment today.     Patient presents to clinic to establish care with new PCP.  Patient reports a history of Stroke.  Patient denies a history of: Hypertension, Elevated Cholesterol, Diabetes, Thyroid problems, Depression,  Anxiety, Neurological problems, and Abdominal problems.    Active Ambulatory Problems    Diagnosis Date Noted   Vertebral artery dissection (HCC) 09/09/2020   Obesity (BMI 35.0-39.9 without comorbidity) 09/09/2020   Essential hypertension 09/09/2020   Acute stroke of medulla oblongata (HCC)    Stroke (HCC) 09/10/2020   Lateral medullary syndrome    Ischemic cerebrovascular accident (CVA) of frontal lobe (HCC) 09/13/2020   ASD (atrial septal defect)    Slow transit constipation    Morbid obesity (HCC)    Neck pain    Thoracic back pain 11/01/2020   Somatic dysfunction of spine, thoracic 11/01/2020   Resolved Ambulatory Problems    Diagnosis Date Noted   No Resolved Ambulatory Problems   No Additional Past Medical History   History reviewed. No pertinent surgical history.  History reviewed. No pertinent family history.   Review of Systems  Neurological:  Positive for dizziness.       Change in sensation   Per HPI unless specifically indicated above     Objective:    BP 127/83   Pulse 72   Temp 98.7 F (37.1 C) (Oral)   Ht 6' (1.829 m)   Wt (!) 311 lb 9.6 oz (141.3 kg)   SpO2 98%   BMI 42.26 kg/m   Wt Readings from Last 3 Encounters:  11/14/20 (!) 311 lb 9.6 oz (141.3 kg)  11/01/20 (!) 307 lb (139.3 kg)  10/17/20 295 lb 2 oz (133.9 kg)    Physical Exam Vitals and nursing note reviewed.  Constitutional:      General: He is not in acute distress.    Appearance: Normal appearance. He is not ill-appearing, toxic-appearing or diaphoretic.  HENT:     Head: Normocephalic.     Right Ear: External ear normal.     Left Ear: External ear normal.     Nose: Nose normal. No congestion or rhinorrhea.     Mouth/Throat:     Mouth: Mucous membranes are moist.  Eyes:     General:        Right eye: No discharge.        Left eye: No discharge.     Extraocular Movements: Extraocular movements intact.     Conjunctiva/sclera: Conjunctivae normal.     Pupils: Pupils are  equal, round, and reactive to light.  Cardiovascular:     Rate and Rhythm: Normal rate and regular rhythm.     Heart sounds: No murmur heard. Pulmonary:     Effort: Pulmonary effort is normal. No respiratory distress.     Breath sounds: Normal breath sounds. No wheezing, rhonchi or rales.  Abdominal:     General: Abdomen is flat. Bowel sounds are normal.  Musculoskeletal:     Cervical back: Normal range of motion and neck supple.  Skin:    General: Skin is warm and dry.     Capillary Refill: Capillary refill takes less than 2 seconds.  Neurological:     General: No focal deficit present.     Mental Status: He is alert and oriented to person, place, and time.  Psychiatric:        Mood and Affect: Mood normal.        Behavior: Behavior normal.        Thought Content: Thought content normal.        Judgment: Judgment normal.    Results for orders placed or performed during the hospital encounter of 11/04/20  Lipase, blood  Result Value Ref Range   Lipase 41 11 - 51 U/L  Comprehensive metabolic panel  Result Value Ref Range   Sodium 138 135 - 145 mmol/L   Potassium 4.2 3.5 - 5.1 mmol/L   Chloride 103 98 - 111 mmol/L   CO2 27 22 - 32 mmol/L   Glucose, Bld 103 (H) 70 - 99 mg/dL   BUN 16 6 - 20 mg/dL   Creatinine, Ser 6.38 0.61 - 1.24 mg/dL   Calcium 9.4 8.9 - 75.6 mg/dL   Total Protein 7.1 6.5 - 8.1 g/dL   Albumin 4.0 3.5 - 5.0 g/dL   AST 47 (H) 15 - 41 U/L   ALT 64 (H) 0 - 44 U/L   Alkaline Phosphatase 75 38 - 126 U/L   Total Bilirubin 0.9 0.3 - 1.2 mg/dL   GFR, Estimated >43 >32 mL/min   Anion gap 8 5 - 15  CBC  Result Value Ref Range   WBC 10.8 (H) 4.0 - 10.5 K/uL   RBC 5.31 4.22 - 5.81 MIL/uL   Hemoglobin 15.7 13.0 - 17.0 g/dL   HCT 95.1 88.4 - 16.6 %   MCV 85.9 80.0 - 100.0 fL   MCH 29.6 26.0 - 34.0 pg   MCHC 34.4 30.0 - 36.0 g/dL   RDW 06.3 01.6 - 01.0 %   Platelets 252 150 - 400 K/uL   nRBC 0.0 0.0 - 0.2 %  Assessment & Plan:   Problem List Items  Addressed This Visit       Cardiovascular and Mediastinum   Vertebral artery dissection (HCC)    Improving after stroke.  Continue to follow up with Neurology.  Continue with Plavix and ASA for 3 months.  Follow up in 1 month.      Ischemic cerebrovascular accident (CVA) of frontal lobe (HCC) - Primary    Ongoing. Patient continues to work with PT/OT weekly.  He has follow up with Neurology today to discuss most recent imaging results.  Will be seeing Neuro Ophthalmology for his vision concerns as well as the dizziness.  Patient will also have crystal therapy done with PT next week to help with the dizziness.  If symptoms do not improve at that point, will refer to ENT for further evaluation and management.  Patient understands and agrees with the plan of care.       Other Visit Diagnoses     Encounter to establish care            Follow up plan: Return in about 1 month (around 12/15/2020) for Physical and Fasting labs.   A total of 40 minutes were spent on this encounter today.  When total time is documented, this includes both the face-to-face and non-face-to-face time personally spent before, during and after the visit on the date of the encounter reviewing hospitalization, neurology notes, imaging, and discussing plan of care with patient.

## 2020-11-13 NOTE — Therapy (Signed)
[facility] MAIN [facility] SERVICES [address] [place], [place], [address] Phone: [phone]   Fax:  [phone]  Physical Therapy Treatment/Recertification for dates 11/13/2020- 12/25/2020  Patient Details  Name: Curtis [name] MRN: [id number] Date of Birth: April 02, 1985 Referring Provider (PT): Alfredo Batty   Encounter Date: 11/13/2020    Past Medical History:  Diagnosis Date   Stroke [facility])     History reviewed. No pertinent surgical history.  There were no vitals filed for this visit.   Interventions:    *Patient has new orders from Dr. Wynn Banker including vestibular rehab for vertigo and assessment of BPPV including use of tilt table. Patient will require today's visit to be labeled as a recertification due to change in plan. Will include canalith repositioning and Vestibular rehab in plan. All goals are still appropriate and plan to recertify patient for remaining 6 weeks until original cert ends on 12/25/2020.    INTERVENTIONS:   Therex:  Seated  with green tband: BUE shoulder mid row x20 reps BUE Scap row- flared elbows  BUE Scap low row x 20 reps   Supine with foam roll along spine:  Serratus scap protrac uisng 5lb x 20 reps BUE ER using Green theraband x 20 reps BUE reps D2 PNF using 5 lb.  X1 5 reps B UE  Horizontal ABD using green theraband Supine Overhead ER with PVC x 15 reps BUE Prone Y scaption using 5lb dumbell Prone BUE shoulder extension x20 reps using 5lb. dumbell   Standing facing wall- Y scap overhead x 15 reps BUE.  Standing wall posture stretch - holding a PVC pipe overhead for anterior chest/shoulder stretch- hold 20 sec x 3.   Education provided throughout session via VC/TC and demonstration to facilitate movement at target joints and correct muscle activation for all testing and exercises performed.     Clinical Impression: Patient able to perform all postural strengthening without  complaint of shoulder pain or any vertigo symptoms with position changes today. He was able to progress with additional scapular exercises and remains well motivated with just verbal/visual cues for correct technique. He was able to use 5lb dumbell well with scapular and shoulder strengthening. He will need to be recertified with all goals still appropriate- Just updating plan of care to reflect addition order for vestibular rehab with canalith repositioning using tilt table. Patient's condition has the potential to improve in response to therapy. Maximum improvement is yet to be obtained. The anticipated improvement is attainable and reasonable in a generally predictable time.                            PT Short Term Goals - 11/06/20 1104       PT SHORT TERM GOAL #1   Title Patient will be independent in home exercise program to improve strength/mobility for better functional independence with ADLs.    Baseline Pt has HEP from hospital discharge but it is not progressive    Time 4    Period Weeks    Status Achieved    Target Date 10/30/20      PT SHORT TERM GOAL #2   Title Patient (< 71 years old) will complete five times sit to stand test in < 15 seconds indicating an increased LE strength and improved balance.    Baseline >20 sec, 11.6 seconds    Time 4    Period Weeks    Status Achieved  Target Date 10/30/20      PT SHORT TERM GOAL #3   Title Patient will increase six minute walk test distance to >1200 with LRAD for progression to community ambulator and improve gait ability    Baseline 1015 feet with rollator    Time 4    Period Weeks    Status New               PT Long Term Goals - 10/02/20 1724       PT LONG TERM GOAL #1   Title Patient will increase FOTO score to equal to or greater than 15    to demonstrate statistically significant improvement in mobility and quality of life.    Baseline 63.5    Time 12    Period Weeks    Status New     Target Date 12/25/20      PT LONG TERM GOAL #2   Title Patient will increase Functional Gait Assessment score to >25/30 as to reduce fall risk and improve dynamic gait safety with community ambulation.    Baseline 20    Time 12    Period Weeks    Status New    Target Date 12/25/20      PT LONG TERM GOAL #3   Title Patient will ascend/descend 4 stairs without rail assist independently without loss of balance to improve ability to get in/out of home.    Baseline requires use of handrails to safely naigate stairs    Time 12    Period Weeks    Status New      PT LONG TERM GOAL #4   Title Patient will increase six minute walk test distance to >1300 without AD for progression to community ambulator and improve gait ability    Baseline 1015 with rollator    Time 12    Period Weeks    Target Date 12/25/20                   Plan - 11/13/20 1109     Personal Factors and Comorbidities Comorbidity 1;Comorbidity 2    Comorbidities obesity, HTN    Examination-Activity Limitations Caring for Others;Carry;Lift;Locomotion Level;Squat;Stairs;Stand    Examination-Participation Restrictions Driving;Occupation;Yard Work    Conservation officer, historic buildings Evolving/Moderate complexity    Rehab Potential Excellent    PT Frequency 2x / week    PT Duration 12 weeks    PT Treatment/Interventions ADLs/Self Care Home Management;Neuromuscular re-education;Patient/family education;Manual techniques;Passive range of motion;Dry needling;Energy conservation;Vestibular;Visual/perceptual remediation/compensation;Therapeutic activities;Functional mobility training;Therapeutic exercise;Balance training;Gait training;Canalith Repostioning    PT Next Visit Plan MiniBest or other outcome measure, develop and provide HEP,  consider use of mirror to assist with  finding neutral    PT Home Exercise Plan Access Code: [email or web address]; 10/31/2020=Access Code: [email or web address]             Patient will benefit from skilled  therapeutic intervention in order to improve the following deficits and impairments:  Abnormal gait, Decreased activity tolerance, Decreased balance, Decreased coordination, Decreased endurance, Decreased mobility, Decreased range of motion, Decreased strength, Difficulty walking, Dizziness, Impaired perceived functional ability, Impaired vision/preception  Visit Diagnosis: No diagnosis found.     Problem List Patient Active Problem List   Diagnosis Date Noted   Thoracic back pain 11/01/2020   Somatic dysfunction of spine, thoracic 11/01/2020   Slow transit constipation    Morbid obesity (HCC)    Neck pain    ASD (atrial septal defect)    Ischemic cerebrovascular accident (CVA) of  frontal lobe (HCC) 09/13/2020   Stroke (HCC) 09/10/2020   Lateral medullary syndrome    Vertebral artery dissection (HCC) 09/09/2020   Obesity (BMI 35.0-39.9 without comorbidity) 09/09/2020   Essential hypertension 09/09/2020   Acute stroke of medulla oblongata (HCC)     Lenda Kelp, PT 11/13/2020, 11:10 AM  [facility] MAIN [facility] SERVICES [address] [place], [place], [address] Phone: [phone]   Fax:  [phone]  Name: Curtis [name] MRN: [id number] Date of Birth: 06-Aug-1985

## 2020-11-14 ENCOUNTER — Encounter: Payer: Self-pay | Admitting: Nurse Practitioner

## 2020-11-14 ENCOUNTER — Ambulatory Visit: Payer: BC Managed Care – PPO | Admitting: Nurse Practitioner

## 2020-11-14 ENCOUNTER — Other Ambulatory Visit: Payer: Self-pay

## 2020-11-14 ENCOUNTER — Encounter
Payer: BC Managed Care – PPO | Attending: Physical Medicine and Rehabilitation | Admitting: Physical Medicine and Rehabilitation

## 2020-11-14 ENCOUNTER — Ambulatory Visit: Payer: BC Managed Care – PPO

## 2020-11-14 VITALS — BP 127/83 | HR 72 | Temp 98.7°F | Ht 72.0 in | Wt 311.6 lb

## 2020-11-14 DIAGNOSIS — G464 Cerebellar stroke syndrome: Secondary | ICD-10-CM | POA: Diagnosis not present

## 2020-11-14 DIAGNOSIS — H532 Diplopia: Secondary | ICD-10-CM | POA: Diagnosis not present

## 2020-11-14 DIAGNOSIS — M542 Cervicalgia: Secondary | ICD-10-CM

## 2020-11-14 DIAGNOSIS — I639 Cerebral infarction, unspecified: Secondary | ICD-10-CM | POA: Diagnosis not present

## 2020-11-14 DIAGNOSIS — R200 Anesthesia of skin: Secondary | ICD-10-CM

## 2020-11-14 DIAGNOSIS — Z7689 Persons encountering health services in other specified circumstances: Secondary | ICD-10-CM | POA: Diagnosis not present

## 2020-11-14 DIAGNOSIS — I7774 Dissection of vertebral artery: Secondary | ICD-10-CM

## 2020-11-14 DIAGNOSIS — R202 Paresthesia of skin: Secondary | ICD-10-CM

## 2020-11-14 DIAGNOSIS — H518 Other specified disorders of binocular movement: Secondary | ICD-10-CM | POA: Diagnosis not present

## 2020-11-14 MED ORDER — FLUOXETINE HCL 20 MG PO CAPS: 40.0000 mg | ORAL_CAPSULE | Freq: Two times a day (BID) | ORAL | 3 refills | Status: DC

## 2020-11-14 MED ORDER — CLOPIDOGREL BISULFATE 75 MG PO TABS: 75.0000 mg | ORAL_TABLET | Freq: Every day | ORAL | 0 refills | Status: DC

## 2020-11-14 NOTE — Assessment & Plan Note (Signed)
Ongoing. Patient continues to work with PT/OT weekly.  He has follow up with Neurology today to discuss most recent imaging results.  Will be seeing Neuro Ophthalmology for his vision concerns as well as the dizziness.  Patient will also have crystal therapy done with PT next week to help with the dizziness.  If symptoms do not improve at that point, will refer to ENT for further evaluation and management.  Patient understands and agrees with the plan of care.

## 2020-11-14 NOTE — Assessment & Plan Note (Signed)
Improving after stroke.  Continue to follow up with Neurology.  Continue with Plavix and ASA for 3 months.  Follow up in 1 month.

## 2020-11-14 NOTE — Progress Notes (Signed)
Subjective:    Patient ID: Curtis [name], male    DOB: 02-01-1985, 35 y.o.   MRN: [id number]  HPI An audio/video tele-health visit is felt to be the most appropriate encounter for this patient at this time. This is a follow up tele-visit via MyChart. The patient is at home. MD is at office.    [name] is a 35 year old man who presents for hospital follow-up after CIR admission for ischemic CVA of the frontal lobe. CVA occurred after chiropractic manipulation for neck pain. CTA showed dissection of left vertebral artery. MRI brain showed small left lateral medullary infarct.   Medical problems: ischemic CVA of the frontal lobe, neck pain, lateral medullary syndrome, vertebral artery dissection, slow transit constipation, morbid obesity, neck pain, essential hypertension, active smoker  Smoking history: smokes cigars some days  Social history: first child was born three months ago. 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 Enjoys golfing.   1) Ischemic CVA of the frontal lobe -has been going to OT -feels vision has been worsening +dizziness, nausea, diplopia, and fatigue -strength in both upper extremities has been affected L>R  2) Neck pain -initially 3-5/10 pain in left side of neck, aching, chronic -has greatly improved with exercises -discussed MRI results today  3) Bilateral upper extremity weakness: -worse on left side  4) Burning in wrist  5) Popping throughout the body -multiple joints -has been seeing Terrilee Files, DO who has been doing mild manipulations -does  6) vertigo -continues on meclizine 50mg  TID  7) Cholecystitis -plan for gallbladder removal in December.   8) BMI 42.26  -sometimes has late night snacks.    Review of Systems +dizziness, nausea, diplopia, and fatigue, +numbness in right thumb     Objective:   Physical Exam Not er       Assessment & Plan:  1) Impaired mobility and ADLs s/p ischemic frontal CVA -continue therapies, therapy notes  reviewed.  -provided dietary and exercise counseling -discussed that hypertension is number one reversible risk factor for stroke.   -given worsening vision, recommend f/u with neuro-ophthalmology- provided referral -Brain MRI stable. Shows prior infarct, paraspinal disease- asymptomatic. -reviewed lipid panel: TG, LDL, HDL look excellent: decrease atorvastatin to 20mg  and can stop 11/15. Discussed potential side effects of statin and reviewed neurology note- stroke etiology is related to chiropractic manipulation he received rather than cardiac risk factors.  -refilled plavix- can stop plavix and aspirin 11/15 -continue to walk daily, especially after meals. -may begin light resistance training   2)Chronic Pain Syndrome secondary to neck pain -Discussed current symptoms of pain and history of pain.  -recommended applying voltaren gel -discussed that MRI shows left sided neural foraminal impingement, and there is spondylosis -continue HEP which has been very effective -continue weight loss -d/c robaxin -discussed wean for gabapentin  4) Bilateral upper extremity weakness L>R -Discussed [employer] PNS system as an option of pain treatment via neuromodulation. Provided following link for patient to learn more about the system: https://www.spr[employer].com/. Explained mechanism of activating A beta fibers which function in touch, pressure, and vibration, to inhibit the sensations of A delta and C fibers which are responsible for pain transmission.    5) Vertigo: continue with PT eval for BPPV -continue meclizine  6) Obesity BMI 42.26 -walk daily -begin light resistance training -encouraged healthy diet -encouraged no snacks after dinner time -reviewed lipid panel and HgbA1c which are in normal limits -discussed that Cbgs were elevated in hospital and insulin resistance can take place years  before it is reflected in HgbA1c

## 2020-11-17 ENCOUNTER — Other Ambulatory Visit: Payer: Self-pay | Admitting: Physical Medicine and Rehabilitation

## 2020-11-17 MED ORDER — FLUOXETINE HCL 40 MG PO CAPS: 40.0000 mg | ORAL_CAPSULE | Freq: Every day | ORAL | 2 refills | Status: DC

## 2020-11-18 ENCOUNTER — Other Ambulatory Visit: Payer: Self-pay | Admitting: *Deleted

## 2020-11-18 DIAGNOSIS — F4322 Adjustment disorder with anxiety: Secondary | ICD-10-CM | POA: Diagnosis not present

## 2020-11-18 DIAGNOSIS — F411 Generalized anxiety disorder: Secondary | ICD-10-CM | POA: Diagnosis not present

## 2020-11-18 MED ORDER — FLUOXETINE HCL 40 MG PO CAPS: 40.0000 mg | ORAL_CAPSULE | Freq: Every day | ORAL | 0 refills | Status: DC

## 2020-11-18 NOTE — Telephone Encounter (Signed)
Pharmacy request 90 day supply of fluoxetine.

## 2020-11-19 ENCOUNTER — Ambulatory Visit: Payer: BC Managed Care – PPO | Admitting: Physical Therapy

## 2020-11-19 ENCOUNTER — Encounter: Payer: Self-pay | Admitting: Physical Therapy

## 2020-11-19 ENCOUNTER — Ambulatory Visit: Payer: BC Managed Care – PPO | Admitting: Occupational Therapy

## 2020-11-19 ENCOUNTER — Other Ambulatory Visit: Payer: Self-pay

## 2020-11-19 DIAGNOSIS — R278 Other lack of coordination: Secondary | ICD-10-CM

## 2020-11-19 DIAGNOSIS — R262 Difficulty in walking, not elsewhere classified: Secondary | ICD-10-CM

## 2020-11-19 DIAGNOSIS — R2681 Unsteadiness on feet: Secondary | ICD-10-CM | POA: Diagnosis not present

## 2020-11-19 DIAGNOSIS — M6281 Muscle weakness (generalized): Secondary | ICD-10-CM | POA: Diagnosis not present

## 2020-11-19 DIAGNOSIS — I7774 Dissection of vertebral artery: Secondary | ICD-10-CM | POA: Diagnosis not present

## 2020-11-19 DIAGNOSIS — R269 Unspecified abnormalities of gait and mobility: Secondary | ICD-10-CM | POA: Diagnosis not present

## 2020-11-19 DIAGNOSIS — R2689 Other abnormalities of gait and mobility: Secondary | ICD-10-CM | POA: Diagnosis not present

## 2020-11-19 DIAGNOSIS — H543 Unqualified visual loss, both eyes: Secondary | ICD-10-CM | POA: Diagnosis not present

## 2020-11-19 NOTE — Therapy (Signed)
[facility] MAIN [facility] SERVICES [address] [place], [place], [address] Phone: [phone]   Fax:  [phone]  Occupational Therapy Treatment  Patient Details  Name: Curtis [name] MRN: [id number] Date of Birth: [birth year] Referring Provider (OT): [name]   Encounter Date: 11/19/2020   OT End of Session - 11/19/20 1542     Visit Number 11    Number of Visits 24    Date for OT Re-Evaluation 12/25/20    Authorization Time Period Progress report period starting 10/02/2020    OT Start Time 1433    OT Stop Time 1515    OT Time Calculation (min) 42 min    Activity Tolerance Patient tolerated treatment well    Behavior During Therapy [facility] for tasks assessed/performed             Past Medical History:  Diagnosis Date   Stroke [facility])     No past surgical history on file.  There were no vitals filed for this visit.   Subjective Assessment - 11/19/20 1541     Subjective  Pt. reports having had an eye appointment last week.    Patient is accompanied by: Family member    Pertinent History Pt. is a 35 y.o. male who was diagnosed with Vertebral Artery Dissection, and Small Acute CVA. Pt. has Dizziness, nausea, diplopia, and fatigue. Pt.'s 1st child was born 3 months ago. Pt. 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    Patient Stated Goals To be able to hold, and feed his daughter, and to be able to play golf again.    Currently in Pain? No/denies            OT TREATMENT    Pt. is making excellent progress with visual compensatory strategies. Pt. was able to complete simple, and complex trail making tasks, and visual saccades for tabletop tasks efficiently in a wide visual scan plane. Pt. was able to complete a word search, structured complex circles search, random plain circles , and random complex circles searches all efficiently using horizontal, and horizontal rectilinear searches. Pt. omitted one complex circle in bottom  right. Pt. was able to efficiently identify the omission, and self correct it independently. Pt. was able to efficiently type copying a full [facility] sheet of sentences placed vertically to the right. Pt. reports that he has difficulty filling out forms/documents efficiently. Pt. continues to work on improving UE strength, and [facility] skills in order to work towards improving, and maximizing independence with ADLs, and IADL tasks.                         OT Education - 11/19/20 1542     Education Details HEP progression    Person(s) Educated Patient    Methods Explanation    Comprehension Verbalized understanding;Returned demonstration;Verbal cues required              OT Short Term Goals - 11/13/20 1525       OT SHORT TERM GOAL #1   Title Pt. will improve FOTO score by 3 points for clinically relevant change during ADLs, and IADL tasks.    Baseline 10th visit: FOTO: 76, TR 75 Eval: FOTO score 63 TR score 75    Time 12    Period Weeks    Status Achieved               OT Long Term Goals - 11/13/20 1525  OT LONG TERM GOAL #1   Title Pt. will improve UE strength in order to be able to hold, and feed his baby with modified independence.    Baseline 10th visit: Pt. is able to hold, feed, and burp his daughter from a sitting posiiton.Eval: Pt. is unable to hold, and feed his baby.    Time 12    Period Weeks      OT LONG TERM GOAL #2   Title Pt. will perform light homemaking tasks with modified indepedence in standing    Baseline 10th: Pt. is able to perform laundry, dishes, making the bed, cooking, cleaning.EVal: Limited standing tolerance during home management tasks.    Time 12    Period Weeks    Status Achieved      OT LONG TERM GOAL #3   Title Pt. will independently demonstrate visual compensatory strategies 100% of the time during tabletop ADLs, and IADL tasks.    Baseline 10th visit: Pt. is improving with utilizing visual compensatory strategies   during tabletop tasks. Eval: Pt. has difficulty    Time 12    Period Weeks    Status On-going    Target Date 12/25/20      OT LONG TERM GOAL #4   Title Pt. will demonstrate visual compensatory strategies 100% of the time while performing IADL tasks within his extrpersonal space.    Baseline 10th visit: Pt. is improving with utilizing visual compensatory strategies when navigating through his environment. Eval: Pt. has difficulty    Time 12    Period Weeks    Status On-going    Target Date 12/25/20                   Plan - 11/19/20 1542     Clinical Impression Statement Pt. is making excellent progress with visual compensatory strategies. Pt. was able to complete simple, and complex trail making tasks, and visual saccades for tabletop tasks efficiently in a wide visual scan plane. Pt. was able to complete a word search, structured complex circles search, random plain circles , and random complex circles searches all efficiently using horizontal, and horizontal rectilinear searches. Pt. omitted one complex circle in bottom right. Pt. was able to efficiently identify the omission, and self correct it independently. Pt. was able to efficiently type copying a full [facility] sheet of sentences placed vertically to the right. Pt. reports that he has difficulty filling out forms/documents efficiently. Pt. continues to work on improving UE strength, and [facility] skills in order to work towards improving, and maximizing independence with ADLs, and IADL tasks.   OT Occupational Profile and History Detailed Assessment- Review of Records and additional review of physical, cognitive, psychosocial history related to current functional performance    Occupational performance deficits (Please refer to evaluation for details): IADL's;ADL's    Body Structure / Function / Physical Skills ADL;IADL    Rehab Potential Good    Clinical Decision Making Several treatment options, min-mod task modification necessary     Comorbidities Affecting Occupational Performance: May have comorbidities impacting occupational performance    Modification or Assistance to Complete Evaluation  Min-Moderate modification of tasks or assist with assess necessary to complete eval    OT Frequency 3x / week    OT Duration 12 weeks    OT Treatment/Interventions Therapeutic exercise;DME and/or AE instruction;Patient/family education;Therapeutic activities;Self-care/ADL training    Consulted and Agree with Plan of Care Patient             Patient will  benefit from skilled therapeutic intervention in order to improve the following deficits and impairments:   Body Structure / Function / Physical Skills: ADL, IADL       Visit Diagnosis: Muscle weakness (generalized)  Other lack of coordination    Problem List Patient Active Problem List   Diagnosis Date Noted   Thoracic back pain 11/01/2020   Somatic dysfunction of spine, thoracic 11/01/2020   Slow transit constipation    Morbid obesity (HCC)    Neck pain    ASD (atrial septal defect)    Ischemic cerebrovascular accident (CVA) of frontal lobe (HCC) 09/13/2020   Stroke (HCC) 09/10/2020   Lateral medullary syndrome    Vertebral artery dissection (HCC) 09/09/2020   Obesity (BMI 35.0-39.9 without comorbidity) 09/09/2020   Essential hypertension 09/09/2020   Acute stroke of medulla oblongata (HCC)     Olegario Messier, MS, OTR/L 11/19/2020, 3:44 PM  [facility] MAIN [facility] SERVICES [address] [place], [place], [address] Phone: [phone]   Fax:  [phone]  Name: Curtis [name] MRN: [id number] Date of Birth: 1985/06/13

## 2020-11-19 NOTE — Therapy (Signed)
[place] MAIN [facility] SERVICES [address] [place], [place], [address] Phone: [phone]   Fax:  [phone]  Physical Therapy Treatment Physical Therapy Progress Note   Dates of reporting period  10/02/20   to   11/19/20   Patient Details  Name: Curtis [name] MRN: [id number] Date of Birth: 1985-07-19 Referring Provider (PT): Dawayne Patricia   Encounter Date: 11/19/2020   PT End of Session - 11/19/20 1359     Visit Number 10    Number of Visits 24    Date for PT Re-Evaluation 12/25/20    Authorization Type [employer], no auth req    Authorization Time Period Cert 06/02/95-28/20/60    Progress Note Due on Visit 10    PT Start Time 1561    PT Stop Time 1430    PT Time Calculation (min) 41 min    Equipment Utilized During Treatment Gait belt    Activity Tolerance Patient tolerated treatment well    Behavior During Therapy WFL for tasks assessed/performed             Past Medical History:  Diagnosis Date   Stroke [facility])     History reviewed. No pertinent surgical history.  There were no vitals filed for this visit.   Subjective Assessment - 11/19/20 1356     Subjective Patient reports still having dizziness/vertigo symptoms at night. He reports its worse at night. He has been having to get up more at night with his daughter as she has been sick and that has been a challenge; He does report some dizziness and fatigue during the day. He is also having some muscle soreness in RUE;    Pertinent History Curtis [name] is a 35 y.o. M with obesity no other significant PMHx presented to hospital  with acute onset nausea, dizziness. Patient had recently seen a chiropractor and being treated with cervical manipulation.  On the morning of admission he developed sudden dizziness, nausea, neck pain, and headache and so he came to the ER.    CT angiogram was obtained that showed findings consistent with dissection of the left vertebral artery.   MRI brain showed small ischemic infarct in the left lateral medulla. Pt had stay at inpatient rehab and has made significant progress in his funciton since discharge. Pt also has 3 m.o. daughter he would like to be able to have a larger part in caring for but is restricted due to his impairments. at I.E. patient presented to therapy ambulating with rollator.    Limitations Lifting;Walking;Standing    How long can you sit comfortably? no issues with sitting    How long can you stand comfortably? 5-10 min    How long can you walk comfortably? 30 min (at grocery store)    Currently in Pain? Yes    Pain Score 4     Pain Location Shoulder    Pain Orientation Right    Pain Descriptors / Indicators Aching;Sore    Pain Type Acute pain    Pain Onset In the past 7 days    Pain Frequency Intermittent    Aggravating Factors  shoulder abduction/inferior glide    Pain Relieving Factors changing positioning/stretch    Effect of Pain on Daily Activities decreased ADL tolerance;    Multiple Pain Sites No                [facility] PT Assessment - 11/19/20 0001       Observation/Other Assessments  Focus on Therapeutic Outcomes (FOTO)  61%      6 Minute walk- Post Test   6 Minute Walk Post Test yes    HR (bpm) 105    02 Sat (%RA) 96 %    Modified Borg Scale for Dyspnea 3- Moderate shortness of breath or breathing difficulty    Perceived Rate of Exertion (Borg) 13- Somewhat hard      6 minute walk test results    Aerobic Endurance Distance Walked 1450    Endurance additional comments without AD, community ambulator but less than age group norms of >2000 feet      Functional Gait  Assessment   Gait Level Surface Walks 20 ft in less than 5.5 sec, no assistive devices, good speed, no evidence for imbalance, normal gait pattern, deviates no more than 6 in outside of the 12 in walkway width.    Change in Gait Speed Able to smoothly change walking speed without loss of balance or gait deviation. Deviate  no more than 6 in outside of the 12 in walkway width.    Gait with Horizontal Head Turns Performs head turns smoothly with no change in gait. Deviates no more than 6 in outside 12 in walkway width    Gait with Vertical Head Turns Performs task with slight change in gait velocity (eg, minor disruption to smooth gait path), deviates 6 - 10 in outside 12 in walkway width or uses assistive device    Gait and Pivot Turn Pivot turns safely within 3 sec and stops quickly with no loss of balance.    Step Over Obstacle Is able to step over one shoe box (4.5 in total height) without changing gait speed. No evidence of imbalance.    Gait with Narrow Base of Support Ambulates 4-7 steps.    Gait with Eyes Closed Walks 20 ft, uses assistive device, slower speed, mild gait deviations, deviates 6-10 in outside 12 in walkway width. Ambulates 20 ft in less than 9 sec but greater than 7 sec.    Ambulating Backwards Walks 20 ft, uses assistive device, slower speed, mild gait deviations, deviates 6-10 in outside 12 in walkway width.    Steps Alternating feet, must use rail.    Total Score 23                TREATMENT: PT assessed outcome measures, see above  Patient expressed continued difficulty getting up out of glider when holding daughter;  He also reports difficulty walking on unlevel surface such as outside in yard.  Patient's condition has the potential to improve in response to therapy. Maximum improvement is yet to be obtained. The anticipated improvement is attainable and reasonable in a generally predictable time.  Patient reports adherence with HEP. He reports feeling like he is doing better overall with improvement in mobility. However he is still having vertigo symptoms at night and feels unsteady at times. He is currently walking without any AD at all times.                     PT Education - 11/19/20 1359     Education Details progress towards goals;    Person(s) Educated  Patient    Methods Explanation;Verbal cues    Comprehension Verbalized understanding;Returned demonstration;Verbal cues required;Need further instruction              PT Short Term Goals - 11/19/20 1359       PT SHORT TERM GOAL #1  Title Patient will be independent in home exercise program to improve strength/mobility for better functional independence with ADLs.    Baseline Pt has HEP from hospital discharge but it is not progressive, 10/25: doing exercises every other day    Time 4    Period Weeks    Status Achieved    Target Date 10/30/20      PT SHORT TERM GOAL #2   Title Patient (< 75 years old) will complete five times sit to stand test in < 15 seconds indicating an increased LE strength and improved balance.    Baseline >20 sec, 11.6 seconds    Time 4    Period Weeks    Status Achieved    Target Date 10/30/20      PT SHORT TERM GOAL #3   Title Patient will increase six minute walk test distance to >1200 with LRAD for progression to community ambulator and improve gait ability    Baseline 1015 feet with rollator, 10/25: 1450 feet without AD    Time 4    Period Weeks    Status Achieved    Target Date 10/30/20               PT Long Term Goals - 11/19/20 1410       PT LONG TERM GOAL #1   Title Patient will increase FOTO score to equal to or greater than 15    to demonstrate statistically significant improvement in mobility and quality of life.    Baseline 63.5, 10/25: 61%    Time 12    Period Weeks    Status On-going    Target Date 12/25/20      PT LONG TERM GOAL #2   Title Patient will increase Functional Gait Assessment score to >25/30 as to reduce fall risk and improve dynamic gait safety with community ambulation.    Baseline 20, 10/25: 23/30    Time 12    Period Weeks    Status Partially Met    Target Date 12/25/20      PT LONG TERM GOAL #3   Title Patient will ascend/descend 4 stairs without rail assist independently without loss of balance to  improve ability to get in/out of home.    Baseline requires use of handrails to safely naigate stairs, 10/25: requires 1 rail assist when descending;    Time 12    Period Weeks    Status Partially Met    Target Date 12/25/20      PT LONG TERM GOAL #4   Title Patient will increase six minute walk test distance to >1800 feet without AD for progression to community ambulator and improve gait ability    Baseline 1015 with rollator, 10/25: 1450    Time 12    Period Weeks    Status Revised    Target Date 12/25/20      PT LONG TERM GOAL #5   Title Patient will improve UE shoulder/scapular strength to 4/5 or greater to improve shoulder stability and safety when lifting daughter or doing ADLs.    Baseline grossly 3/5    Time 6    Period Weeks    Status New    Target Date 12/25/20                   Plan - 11/19/20 1437     Clinical Impression Statement Patient motivated and participated well within session. PT instructed patient in outcome measures to address progress towards goals. He exhibits  significant improvement in gait ability and balance. However despite these improvements he still has deficits. he exhibits ankle instability with prolonged ambulation or when on uneven surface which makes high level walking difficulty. He also reports difficulty when getting up from unstable surfaces like rockers when holding his daughter. Patient is able to negotiate steps reciprocally but does require intermittent rail assist especially when descending. He exhibits increased instability when looking down with forward propulsion. He reports continued vertigo symptoms especially at night when getting in bed. Patient recently saw a neurological opthamologist who states that his eyes are not quite level which could be contributing to some dizziness. He would benefit from additional skilled PT Intervention to improve proximal scapular strength, improve ankle stabilization and improve high level balance  tasks for return to PLOF. Patient agreeable.    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 complexity    Rehab Potential Excellent    PT Frequency 2x / week    PT Duration 12 weeks    PT Treatment/Interventions ADLs/Self Care Home Management;Neuromuscular re-education;Patient/family education;Manual techniques;Passive range of motion;Dry needling;Energy conservation;Vestibular;Visual/perceptual remediation/compensation;Therapeutic activities;Functional mobility training;Therapeutic exercise;Balance training;Gait training;Canalith Repostioning    PT Next Visit Plan MiniBest or other outcome measure, develop and provide HEP,  consider use of mirror to assist with  finding neutral    PT Home Exercise Plan Access Code: [email or web address]; 10/31/2020=Access Code: [email or web address]             Patient will benefit from skilled therapeutic intervention in order to improve the following deficits and impairments:  Abnormal gait, Decreased activity tolerance, Decreased balance, Decreased coordination, Decreased endurance, Decreased mobility, Decreased range of motion, Decreased strength, Difficulty walking, Dizziness, Impaired perceived functional ability, Impaired vision/preception  Visit Diagnosis: Muscle weakness (generalized)  Abnormality of gait and mobility  Difficulty in walking, not elsewhere classified  Unsteadiness on feet     Problem List Patient Active Problem List   Diagnosis Date Noted   Thoracic back pain 11/01/2020   Somatic dysfunction of spine, thoracic 11/01/2020   Slow transit constipation    Morbid obesity ([place])    Neck pain    ASD (atrial septal defect)    Ischemic cerebrovascular accident (CVA) of frontal  lobe ([place]) 09/13/2020   Stroke ([place]) 09/10/2020   Lateral medullary syndrome    Vertebral artery dissection ([place]) 09/09/2020   Obesity (BMI 35.0-39.9 without comorbidity) 09/09/2020   Essential hypertension 09/09/2020   Acute stroke of medulla oblongata ([place])     Kleigh Hoelzer, PT, DPT 11/19/2020, 2:40 PM  [facility] [address] [place], [place], [address] Phone: [phone]   Fax:  [phone]  Name: Curtis [name] MRN: [id number] Date of Birth: 28-May-1985

## 2020-11-20 ENCOUNTER — Other Ambulatory Visit: Payer: Self-pay | Admitting: Adult Health

## 2020-11-20 DIAGNOSIS — I7774 Dissection of vertebral artery: Secondary | ICD-10-CM

## 2020-11-20 DIAGNOSIS — I639 Cerebral infarction, unspecified: Secondary | ICD-10-CM

## 2020-11-21 ENCOUNTER — Ambulatory Visit: Payer: BC Managed Care – PPO | Admitting: Physical Therapy

## 2020-11-21 ENCOUNTER — Ambulatory Visit: Payer: BC Managed Care – PPO | Admitting: Occupational Therapy

## 2020-11-21 ENCOUNTER — Ambulatory Visit: Payer: BC Managed Care – PPO

## 2020-11-21 ENCOUNTER — Other Ambulatory Visit: Payer: Self-pay

## 2020-11-21 DIAGNOSIS — I7774 Dissection of vertebral artery: Secondary | ICD-10-CM | POA: Diagnosis not present

## 2020-11-21 DIAGNOSIS — R269 Unspecified abnormalities of gait and mobility: Secondary | ICD-10-CM | POA: Diagnosis not present

## 2020-11-21 DIAGNOSIS — R278 Other lack of coordination: Secondary | ICD-10-CM | POA: Diagnosis not present

## 2020-11-21 DIAGNOSIS — R2681 Unsteadiness on feet: Secondary | ICD-10-CM | POA: Diagnosis not present

## 2020-11-21 DIAGNOSIS — R262 Difficulty in walking, not elsewhere classified: Secondary | ICD-10-CM | POA: Diagnosis not present

## 2020-11-21 DIAGNOSIS — R2689 Other abnormalities of gait and mobility: Secondary | ICD-10-CM | POA: Diagnosis not present

## 2020-11-21 DIAGNOSIS — M6281 Muscle weakness (generalized): Secondary | ICD-10-CM | POA: Diagnosis not present

## 2020-11-21 DIAGNOSIS — H543 Unqualified visual loss, both eyes: Secondary | ICD-10-CM | POA: Diagnosis not present

## 2020-11-21 NOTE — Therapy (Signed)
[place] [facility] SERVICES [address] [place], [place], [address] Phone: [phone]   Fax:  [phone]  Physical Therapy Treatment  Patient Details  Name: Curtis [name] MRN: [id number] Date of Birth: 08-21-1985 Referring Provider (PT): Dawayne Patricia   Encounter Date: 11/21/2020   PT End of Session - 11/21/20 1429     Visit Number 11    Number of Visits 24    Date for PT Re-Evaluation 12/25/20    Authorization Type [employer], no auth req    Authorization Time Period Cert 01/30/38-08/67/61    Progress Note Due on Visit 10    PT Start Time 1430    PT Stop Time 1514    PT Time Calculation (min) 44 min    Equipment Utilized During Treatment Gait belt    Activity Tolerance Patient tolerated treatment well    Behavior During Therapy WFL for tasks assessed/performed             Past Medical History:  Diagnosis Date   Stroke [facility])     History reviewed. No pertinent surgical history.  There were no vitals filed for this visit.   Subjective Assessment - 11/21/20 1449     Subjective Patient played put put yesterday, tripped on bottom step since last session but caught self.    Pertinent History Curtis [name] is a 35 y.o. M with obesity no other significant PMHx presented to hospital  with acute onset nausea, dizziness. Patient had recently seen a chiropractor and being treated with cervical manipulation.  On the morning of admission he developed sudden dizziness, nausea, neck pain, and headache and so he came to the ER.    CT angiogram was obtained that showed findings consistent with dissection of the left vertebral artery.  MRI brain showed small ischemic infarct in the left lateral medulla. Pt had stay at inpatient rehab and has made significant progress in his funciton since discharge. Pt also has 3 m.o. daughter he would like to be able to have a larger part in caring for but is restricted due to his impairments. at [facility]  patient presented to therapy ambulating with rollator.    Limitations Lifting;Walking;Standing    How long can you sit comfortably? no issues with sitting    How long can you stand comfortably? 5-10 min    How long can you walk comfortably? 30 min (at grocery store)    Currently in Pain? No/denies                 Treatment:  Supine:  Scapular punches; 4lb dumbbell 12x each UE; tactile cueing ; 2 sets, cues for control and decreased speed  Seated on plinth table  On airex pad: sit to stand 10x with weighted blue ball in LUE  On dynadisc sit to stand 10x  with weighted blue ball in LUE BTB row 15x  Standing.  On airex pad:  -Scapular punches with blue weighted ball,10 times patient reports very challenging -Narrow BOS 45 seconds holds noted increased instability with narrowing base of support -bicep curl and overhead raise 12x with blue weighted ball  -Modfiied tandem stance 2x 30 seconds with one foot on airex pad one foot on dynadisc ; x2 sets each LE  Wall pushup with a plus 10x cueing required for proper technique Wall cross body shoulder taps 10x each UE Wall jump and reach terminated after 9 jumps due to increase in patient dizziness.  ambulate across red mat for stabilization  training: Ambulate with just red mat x2 trials increase stability upon second attempt.  Intervention then progressed to include 5 hedgehog's underneath red mat for carryover to unstable surface/grassy situations x4 attempts to cross with increasing stabilization each attempt; close contact-guard required at all times   Ambulate in hallway with forward/backwards walking tossing rainbow ball. 4x 86 ft.  Close contact-guard assist however patient has no episodes of instability   Speed ladder -one foot each square x2 lengths of ladder -two feet in two feet out x2 lengths of ladder patient able to perform with functional speed -lateral stepping two feet in 2 feet out x4 trials increasing speed with  each repetition -diagonal two feet in two feet out.  Increased challenge for patient to perform x4 trials  Pt educated throughout session about proper posture and technique with exercises. Improved exercise technique, movement at target joints, use of target muscles after min to mod verbal, visual, tactile cues.  Note: Portions of this document were prepared using Dragon voice recognition software and although reviewed may contain unintentional dictation errors in syntax, grammar, or spelling.   Patient tolerates general shoulder strengthening and scapular control in addition to ankle pertubation stabilization training on unstable surfaces. Sit to stand from unstable surfaces tolerated well with weighted ball. Close chained strengthening interventions will continue to strengthen scapular region at this time.  Coordination with use of speed ladder is challenging and fatiguing for patient and will continue to be an area of focus.  Patient will see vestibular therapist next session.He would benefit from additional skilled PT Intervention to improve proximal scapular strength, improve ankle stabilization and improve high level balance tasks for return to PLOF         PT Education - 11/21/20 1428     Education Details exercise technique, body mechanics    Person(s) Educated Patient    Methods Explanation;Demonstration;Tactile cues;Verbal cues    Comprehension Verbalized understanding;Returned demonstration;Verbal cues required;Tactile cues required              PT Short Term Goals - 11/19/20 1359       PT SHORT TERM GOAL #1   Title Patient will be independent in home exercise program to improve strength/mobility for better functional independence with ADLs.    Baseline Pt has HEP from hospital discharge but it is not progressive, 10/25: doing exercises every other day    Time 4    Period Weeks    Status Achieved    Target Date 10/30/20      PT SHORT TERM GOAL #2   Title Patient (<  33 years old) will complete five times sit to stand test in < 15 seconds indicating an increased LE strength and improved balance.    Baseline >20 sec, 11.6 seconds    Time 4    Period Weeks    Status Achieved    Target Date 10/30/20      PT SHORT TERM GOAL #3   Title Patient will increase six minute walk test distance to >1200 with LRAD for progression to community ambulator and improve gait ability    Baseline 1015 feet with rollator, 10/25: 1450 feet without AD    Time 4    Period Weeks    Status Achieved    Target Date 10/30/20               PT Long Term Goals - 11/19/20 1410       PT LONG TERM GOAL #1   Title Patient will  increase FOTO score to equal to or greater than 15    to demonstrate statistically significant improvement in mobility and quality of life.    Baseline 63.5, 10/25: 61%    Time 12    Period Weeks    Status On-going    Target Date 12/25/20      PT LONG TERM GOAL #2   Title Patient will increase Functional Gait Assessment score to >25/30 as to reduce fall risk and improve dynamic gait safety with community ambulation.    Baseline 20, 10/25: 23/30    Time 12    Period Weeks    Status Partially Met    Target Date 12/25/20      PT LONG TERM GOAL #3   Title Patient will ascend/descend 4 stairs without rail assist independently without loss of balance to improve ability to get in/out of home.    Baseline requires use of handrails to safely naigate stairs, 10/25: requires 1 rail assist when descending;    Time 12    Period Weeks    Status Partially Met    Target Date 12/25/20      PT LONG TERM GOAL #4   Title Patient will increase six minute walk test distance to >1800 feet without AD for progression to community ambulator and improve gait ability    Baseline 1015 with rollator, 10/25: 1450    Time 12    Period Weeks    Status Revised    Target Date 12/25/20      PT LONG TERM GOAL #5   Title Patient will improve UE shoulder/scapular strength to  4/5 or greater to improve shoulder stability and safety when lifting daughter or doing ADLs.    Baseline grossly 3/5    Time 6    Period Weeks    Status New    Target Date 12/25/20                   Plan - 11/21/20 1624     Clinical Impression Statement Patient tolerates general shoulder strengthening and scapular control in addition to ankle pertubation stabilization training on unstable surfaces. Sit to stand from unstable surfaces tolerated well with weighted ball. Close chained strengthening interventions will continue to strengthen scapular region at this time.  Coordination with use of speed ladder is challenging and fatiguing for patient and will continue to be an area of focus.  Patient will see vestibular therapist next session.He would benefit from additional skilled PT Intervention to improve proximal scapular strength, improve ankle stabilization and improve high level balance tasks for return to PLOF    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 complexity    Rehab Potential Excellent    PT Frequency 2x / week    PT Duration 12 weeks    PT Treatment/Interventions ADLs/Self Care Home Management;Neuromuscular re-education;Patient/family education;Manual techniques;Passive range of motion;Dry needling;Energy conservation;Vestibular;Visual/perceptual remediation/compensation;Therapeutic activities;Functional mobility training;Therapeutic exercise;Balance training;Gait training;Canalith Repostioning    PT Next Visit Plan MiniBest or other outcome measure, develop and provide HEP,  consider use of mirror to assist with  finding neutral    PT Home Exercise Plan Access Code: [email or web address]; 10/31/2020=Access Code:  [email or web address]             Patient will benefit from skilled therapeutic intervention in order to improve the following deficits and impairments:  Abnormal gait, Decreased activity tolerance, Decreased balance, Decreased coordination, Decreased endurance, Decreased mobility, Decreased range of motion, Decreased strength, Difficulty walking, Dizziness, Impaired perceived functional ability, Impaired vision/preception  Visit Diagnosis: Muscle weakness (generalized)  Abnormality of gait and mobility  Difficulty in walking, not elsewhere classified  Unsteadiness on feet     Problem List Patient Active Problem List   Diagnosis Date Noted   Thoracic back pain 11/01/2020   Somatic dysfunction of spine, thoracic 11/01/2020   Slow transit constipation    Morbid obesity ([place])    Neck pain    ASD (atrial septal defect)    Ischemic cerebrovascular accident (CVA) of frontal lobe ([place]) 09/13/2020   Stroke ([place]) 09/10/2020   Lateral medullary syndrome    Vertebral artery dissection ([place]) 09/09/2020   Obesity (BMI 35.0-39.9 without comorbidity) 09/09/2020   Essential hypertension 09/09/2020   Acute stroke of medulla oblongata ([place])    Janna Arch, PT, DPT  11/21/2020, 4:26 PM  [place] [facility] SERVICES [address] [place], [place], [address] Phone: [phone]   Fax:  [phone]  Name: Curtis [name] MRN: [id number] Date of Birth: 12/08/1985

## 2020-11-25 ENCOUNTER — Ambulatory Visit: Payer: BC Managed Care – PPO | Admitting: Physical Therapy

## 2020-11-25 ENCOUNTER — Ambulatory Visit: Payer: BC Managed Care – PPO

## 2020-11-25 ENCOUNTER — Encounter: Payer: Self-pay | Admitting: Physical Therapy

## 2020-11-25 ENCOUNTER — Other Ambulatory Visit: Payer: Self-pay

## 2020-11-25 ENCOUNTER — Telehealth: Payer: Self-pay | Admitting: Adult Health

## 2020-11-25 DIAGNOSIS — R262 Difficulty in walking, not elsewhere classified: Secondary | ICD-10-CM | POA: Diagnosis not present

## 2020-11-25 DIAGNOSIS — R278 Other lack of coordination: Secondary | ICD-10-CM

## 2020-11-25 DIAGNOSIS — R269 Unspecified abnormalities of gait and mobility: Secondary | ICD-10-CM

## 2020-11-25 DIAGNOSIS — R2689 Other abnormalities of gait and mobility: Secondary | ICD-10-CM

## 2020-11-25 DIAGNOSIS — R2681 Unsteadiness on feet: Secondary | ICD-10-CM | POA: Diagnosis not present

## 2020-11-25 DIAGNOSIS — I7774 Dissection of vertebral artery: Secondary | ICD-10-CM | POA: Diagnosis not present

## 2020-11-25 DIAGNOSIS — H543 Unqualified visual loss, both eyes: Secondary | ICD-10-CM

## 2020-11-25 DIAGNOSIS — M6281 Muscle weakness (generalized): Secondary | ICD-10-CM

## 2020-11-25 NOTE — Telephone Encounter (Signed)
Are you wanting a CT Angio Head w or wo contrast?  I've never heard of CT Angio Head Code Stroke?

## 2020-11-25 NOTE — Addendum Note (Signed)
Addended by: Ihor Austin L on: 11/25/2020 04:27 PM   Modules accepted: Orders

## 2020-11-25 NOTE — Telephone Encounter (Signed)
Yes. Sorry. I corrected it!

## 2020-11-25 NOTE — Therapy (Signed)
[place] MAIN [facility] SERVICES [address] [place], [place], [address] Phone: [phone]   Fax:  [phone]  Physical Therapy Treatment  Patient Details  Name: Curtis [name] MRN: [id number] Date of Birth: 1985-05-09 Referring Provider (PT): Dawayne Patricia   Encounter Date: 11/25/2020   PT End of Session - 11/25/20 1321     Visit Number 12    Number of Visits 24    Date for PT Re-Evaluation 12/25/20    Authorization Type [employer], no auth req    Authorization Time Period Cert 06/04/27-24/46/28    Progress Note Due on Visit 10    PT Start Time 1015    PT Stop Time 1100    PT Time Calculation (min) 45 min    Activity Tolerance Patient tolerated treatment well    Behavior During Therapy [facility] for tasks assessed/performed             Past Medical History:  Diagnosis Date   Stroke [facility])     History reviewed. No pertinent surgical history.  There were no vitals filed for this visit.   Subjective Assessment - 11/25/20 1316     Subjective Patient states he is doing well today. Continues to report dizziness, especially at night with bed mobility as well as reports double vision. No recent falls or LOB.    Patient is accompained by: Family member   [name]   Pertinent History Mr. Gilliam is a 35 y.o. M with obesity no other significant PMHx presented to hospital  with acute onset nausea, dizziness. Patient had recently seen a chiropractor and being treated with cervical manipulation.  On the morning of admission he developed sudden dizziness, nausea, neck pain, and headache and so he came to the ER.    CT angiogram was obtained that showed findings consistent with dissection of the left vertebral artery.  MRI brain showed small ischemic infarct in the left lateral medulla. Pt had stay at inpatient rehab and has made significant progress in his funciton since discharge. Pt also has 3 m.o. daughter he would like to be able to have a larger  part in caring for but is restricted due to his impairments. at I.E. patient presented to therapy ambulating with rollator.    Limitations Lifting;Walking;Standing    How long can you sit comfortably? no issues with sitting    How long can you stand comfortably? 5-10 min    How long can you walk comfortably? 30 min (at grocery store)    Patient Stated Goals holding dighter, being able to return to golf    Currently in Pain? No/denies    Pain Onset In the past 7 days              Vestibular Screening  History: Patient with history of left vertebral artery dissection status post chiropractic adjustment per medical report. In addition, MRI brain showed small ischemic infarct in the left lateral medulla per MR. Patient reports that he does not have any restrictions from the doctor other than to not participate in contact sports. Per MR, patient has new orders from Dr. Dianna Limbo including vestibular rehab for vertigo and assessment of BPPV including use of tilt table. Patient reports that he began to have episodes of dizziness about one month ago. Patient denies any recent falls, jarring head movements, concussions. Patient reports that he only gets daily episodes of dizziness at night when he goes to lie down usually on his left side and when turning  his head in bed. Patient describes his dizziness as vertigo which lasts about 15 seconds. Patient reports that opening his eyes helps to stop the dizziness, tipping his head up or down helps "calms it down a little bit", and reports that he takes Meclizine three times a day. Patient reports that exacerbating factors include lying down and rolling over or turning his head will bring on vertigo, and that if he is sitting watching TV with his head tilted to the right he can feel it. Pt states that he gets some unsteadiness during the day and states that he gets some mild lightheadedness sometimes when he stands up after sitting for a while. Patient reports  that his dizziness symptoms have increased recently. Patient reports that he has also been having double vision and was given eye follow exercises from his neuro-optometrist. Patient reports that he wore an eye patch over one eye for one month. Patient reports that the neuro-optometrist said that his right eye is higher than his left eye and might be contributing to some of his symptoms per patient report. Patient states he has a follow-up appointment with the neuro-optometrist in December. Patient reports that he has a follow-up neurologist appointment on December 12th.     BPPV Canal Testing:  Performed in inverted mat table, left and right Dix-Hallpike test and both were negative with no nystagmus observed and patient denied vertigo.  Performed in inverted mat table, left and right horizontal canal roll tests and both were negative with no nystagmus observed and patient denied vertigo.  Note: patient reports that he took Meclizine this morning prior to testing and that he has been taking Meclizine three times a day which might have impacted test results this date.     OCULOMOTOR / VESTIBULAR Testing in room light:  Ocular DJT:TSVXBL  Spontaneous Nystagmus: Normal  Smooth Pursuit: Normal  Saccades:noted mild decrease in speed with saccades to the right; no under or overshooting observed  VOR:Abn; patient reports 3-4/10 dizziness after 1 minute rep  Left Head Impulse:not tested  Right Head Impulse:not tested  Head Shaking Nystagmus:not tested     VORx1:  Performed 1 rep of VOR X 1 in sitting 30 second rep and then 1 rep of 1 minute. Note: patient reports he is not having double vision, but reports mild difficulty focusing on the X target at rest.  Patient denied dizziness after 30 second rep and reported 3-4/10 dizziness after 1 minute rep. Patient reports that he has an easier time seeing the X when he looks to the left as compared to looking to the right.    DGI  testing:  Patient scored 2 with ambulation with vertical head turns and with ambulating up/down stairs.    Speed ladder -one foot each square x4 lengths of ladder -two feet in each square x2 lengths of ladder BLE -"in-in-out" ("icky shuffle") x4 lengths  -"in-in-out-out", 2 feet in 2 feet out x2 lengths of ladder BLE -lateral stepping 2 feet in each x2 trials BLE -lateral "in-in-out-out" two feet in two feet out "zipper".  x2 trials BLE -lateral "in-in-out-out" full diagonal through ladder. x2 trials BLE  Scapular protraction/retraction on EOM (progressed from wall), x12 reps.  Clinical Impression: Patient demonstrates excellent motivation and participation throughout session. Session began with vestibular screening - all BPPV testing negative with mild deficits observed in speed of movement during saccades to the right. Plan to reassess for BPPV in future session due to possible inaccurate results 2/2 pt taking Meclizine this  morning. Remainder of session focused on coordination/footwork drills. Pt demo increased stepping cadence with lateral movement compared to forward propulsion. Additionally, moving to the right/leading with the right foot was more challenging than leading with the LLE, leading to decreased velocity and accuracy of stepping, mild difficulty with coordination. He would benefit from additional skilled PT Intervention to improve proximal scapular strength, improve ankle stabilization and improve high level balance tasks for return to PLOF.           PT Short Term Goals - 11/19/20 1359       PT SHORT TERM GOAL #1   Title Patient will be independent in home exercise program to improve strength/mobility for better functional independence with ADLs.    Baseline Pt has HEP from hospital discharge but it is not progressive, 10/25: doing exercises every other day    Time 4    Period Weeks    Status Achieved    Target Date 10/30/20      PT SHORT TERM GOAL #2   Title  Patient (< 18 years old) will complete five times sit to stand test in < 15 seconds indicating an increased LE strength and improved balance.    Baseline >20 sec, 11.6 seconds    Time 4    Period Weeks    Status Achieved    Target Date 10/30/20      PT SHORT TERM GOAL #3   Title Patient will increase six minute walk test distance to >1200 with LRAD for progression to community ambulator and improve gait ability    Baseline 1015 feet with rollator, 10/25: 1450 feet without AD    Time 4    Period Weeks    Status Achieved    Target Date 10/30/20               PT Long Term Goals - 11/19/20 1410       PT LONG TERM GOAL #1   Title Patient will increase FOTO score to equal to or greater than 15    to demonstrate statistically significant improvement in mobility and quality of life.    Baseline 63.5, 10/25: 61%    Time 12    Period Weeks    Status On-going    Target Date 12/25/20      PT LONG TERM GOAL #2   Title Patient will increase Functional Gait Assessment score to >25/30 as to reduce fall risk and improve dynamic gait safety with community ambulation.    Baseline 20, 10/25: 23/30    Time 12    Period Weeks    Status Partially Met    Target Date 12/25/20      PT LONG TERM GOAL #3   Title Patient will ascend/descend 4 stairs without rail assist independently without loss of balance to improve ability to get in/out of home.    Baseline requires use of handrails to safely naigate stairs, 10/25: requires 1 rail assist when descending;    Time 12    Period Weeks    Status Partially Met    Target Date 12/25/20      PT LONG TERM GOAL #4   Title Patient will increase six minute walk test distance to >1800 feet without AD for progression to community ambulator and improve gait ability    Baseline 1015 with rollator, 10/25: 1450    Time 12    Period Weeks    Status Revised    Target Date 12/25/20      PT  LONG TERM GOAL #5   Title Patient will improve UE shoulder/scapular  strength to 4/5 or greater to improve shoulder stability and safety when lifting daughter or doing ADLs.    Baseline grossly 3/5    Time 6    Period Weeks    Status New    Target Date 12/25/20                   Plan - 11/25/20 1325     Clinical Impression Statement Patient demonstrates excellent motivation and participation throughout session. Session began with vestibular screening - all BPPV testing negative with mild deficits observed in speed of movement during saccades to the right. Plan to reassess for BPPV in future session due to possible inaccurate results 2/2 pt taking Meclizine this morning. Remainder of session focused on coordination/footwork drills. Pt demo increased stepping cadence with lateral movement compared to forward propulsion. Additionally, moving to the right/leading with the right foot was more challenging than leading with the LLE, leading to decreased velocity and accuracy of stepping, mild difficulty with coordination. He would benefit from additional skilled PT Intervention to improve proximal scapular strength, improve ankle stabilization and improve high level balance tasks for return to PLOF.    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 complexity    Rehab Potential Excellent    PT Frequency 2x / week    PT Duration 12 weeks    PT Treatment/Interventions ADLs/Self Care Home Management;Neuromuscular re-education;Patient/family education;Manual techniques;Passive range of motion;Dry needling;Energy conservation;Vestibular;Visual/perceptual remediation/compensation;Therapeutic activities;Functional mobility training;Therapeutic exercise;Balance training;Gait training;Canalith  Repostioning    PT Next Visit Plan MiniBest or other outcome measure, develop and provide HEP,  consider use of mirror to assist with  finding neutral    PT Home Exercise Plan Access Code: [email or web address]; 10/31/2020=Access Code: [email or web address]             Patient will benefit from skilled therapeutic intervention in order to improve the following deficits and impairments:  Abnormal gait, Decreased activity tolerance, Decreased balance, Decreased coordination, Decreased endurance, Decreased mobility, Decreased range of motion, Decreased strength, Difficulty walking, Dizziness, Impaired perceived functional ability, Impaired vision/preception  Visit Diagnosis: Abnormality of gait and mobility  Other lack of coordination  Difficulty in walking, not elsewhere classified  Muscle weakness (generalized)  Other abnormalities of gait and mobility  Unsteadiness on feet     Problem List Patient Active Problem List   Diagnosis Date Noted   Thoracic back pain 11/01/2020   Somatic dysfunction of spine, thoracic 11/01/2020   Slow transit constipation    Morbid obesity ([place])    Neck pain    ASD (atrial septal defect)    Ischemic cerebrovascular accident (CVA) of frontal lobe ([place]) 09/13/2020   Stroke ([place]) 09/10/2020   Lateral medullary syndrome    Vertebral artery dissection ([place]) 09/09/2020   Obesity (BMI 35.0-39.9 without comorbidity) 09/09/2020   Essential hypertension 09/09/2020   Acute stroke of medulla oblongata ([place])      Patrina Levering PT, [place] [address] [place], [place], [address] Phone: [phone]   Fax:  [phone]  Name: Curtis [name] MRN: [id number] Date of Birth: [birth year]

## 2020-11-25 NOTE — Therapy (Signed)
[place] [facility] MAIN [facility] SERVICES [address] [place], [place], [address] Phone: [phone]   Fax:  [phone]  Occupational Therapy Treatment  Patient Details  Name: Curtis [name] MRN: [id number] Date of Birth: 14-Oct-1985 Referring Provider (OT): [name]   Encounter Date: 11/25/2020   OT End of Session - 11/25/20 0956     Visit Number 12    Number of Visits 24    Date for OT Re-Evaluation 12/25/20    Authorization Time Period Progress report period starting 10/02/2020    OT Start Time 0931    OT Stop Time 1013    OT Time Calculation (min) 42 min    Activity Tolerance Patient tolerated treatment well    Behavior During Therapy [facility] for tasks assessed/performed             Past Medical History:  Diagnosis Date   Stroke [facility])     No past surgical history on file.  There were no vitals filed for this visit.   Subjective Assessment - 11/25/20 0936     Subjective  "It's hard to see this morning."    Patient is accompanied by: Family member    Pertinent History Pt. is a 35 y.o. male who was diagnosed with Vertebral Artery Dissection, and Small Acute CVA. Pt. has Dizziness, nausea, diplopia, and fatigue. Pt.'s 1st child was born 3 months ago. Pt. 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    Patient Stated Goals To be able to hold, and feed his daughter, and to be able to play golf again.    Currently in Pain? No/denies    Pain Score 0-No pain    Pain Onset In the past 7 days            Occupational Therapy Treatment: Therapeutic Activity: Pt participated in visual scanning activity filling out practice medical forms.  Pt was able to complete 2 forms each 5 pages, omitting only 1 question.  Completed hidden picture form independently locating 10/10 hidden pictures without complaint of eye fatigue or strain.  Facilitated larger scale visual scanning with locating items within cafeteria setting, identifying items on  posters on wall in a hallway, and saccadic task calling out letters and numbers posted on L and R walls of hallway with good accuracy.  Response to Treatment: Pt began filling out first medical form needing to alternate between closing R eye, then left d/t poor focus with both eyes.  After a couple of minutes, pt reported improved focus with both eyes.  Pt took brief rest breaks between activities by closing eyes.  Overall, paper/pencil tasks have greatly improved with accuracy and tolerance.  Pt reported that he went [place] festival over the weekend and had difficulty locating his sister in a crowd of ~100 people.  Pt reports less fatigue and less need to compensate with varying head positions when watching TV at home.  OT advised on participation in short trips to the grocery store, working to locate 5 items on a list within the store.  Pt will continue to benefit from skilled OT for improving small and large scale visual scanning and saccadic movements in order to maximize independence and tolerance to ADL/IADL tasks.     OT Education - 11/25/20 0956     Education Details HEP progression    Person(s) Educated Patient    Methods Explanation    Comprehension Verbalized understanding;Returned demonstration;Verbal cues required  OT Short Term Goals - 11/13/20 1525       OT SHORT TERM GOAL #1   Title Pt. will improve FOTO score by 3 points for clinically relevant change during ADLs, and IADL tasks.    Baseline 10th visit: FOTO: 76, TR 75 Eval: FOTO score 63 TR score 75    Time 12    Period Weeks    Status Achieved               OT Long Term Goals - 11/13/20 1525       OT LONG TERM GOAL #1   Title Pt. will improve UE strength in order to be able to hold, and feed his baby with modified independence.    Baseline 10th visit: Pt. is able to hold, feed, and burp his daughter from a sitting posiiton.Eval: Pt. is unable to hold, and feed his baby.    Time 12    Period  Weeks      OT LONG TERM GOAL #2   Title Pt. will perform light homemaking tasks with modified indepedence in standing    Baseline 10th: Pt. is able to perform laundry, dishes, making the bed, cooking, cleaning.EVal: Limited standing tolerance during home management tasks.    Time 12    Period Weeks    Status Achieved      OT LONG TERM GOAL #3   Title Pt. will independently demonstrate visual compensatory strategies 100% of the time during tabletop ADLs, and IADL tasks.    Baseline 10th visit: Pt. is improving with utilizing visual compensatory strategies  during tabletop tasks. Eval: Pt. has difficulty    Time 12    Period Weeks    Status On-going    Target Date 12/25/20      OT LONG TERM GOAL #4   Title Pt. will demonstrate visual compensatory strategies 100% of the time while performing IADL tasks within his extrpersonal space.    Baseline 10th visit: Pt. is improving with utilizing visual compensatory strategies when navigating through his environment. Eval: Pt. has difficulty    Time 12    Period Weeks    Status On-going    Target Date 12/25/20               Plan - 11/25/20 1027     Clinical Impression Statement Pt began filling out first medical form needing to alternate between closing R eye, then left d/t poor focus with both eyes.  After a couple of minutes, pt reported improved focus with both eyes.  Pt took brief rest breaks between activities by closing eyes.  Overall, paper/pencil tasks have greatly improved with accuracy and tolerance.  Pt reported that he went to a Fall festival over the weekend and had difficulty locating his sister in a crowd of ~100 people.  Pt reports less fatigue and less need to compensate with varying head positions when watching TV at home.  OT advised on participation in short trips to the grocery store, working to locate 5 items on a list within the store.  Pt will continue to benefit from skilled OT for improving small and large scale visual  scanning and saccadic movements in order to maximize independence and tolerance to ADL/IADL tasks.    OT Occupational Profile and History Detailed Assessment- Review of Records and additional review of physical, cognitive, psychosocial history related to current functional performance    Occupational performance deficits (Please refer to evaluation for details): IADL's;ADL's    Body Structure /  Function / Physical Skills ADL;IADL    Rehab Potential Good    Clinical Decision Making Several treatment options, min-mod task modification necessary    Comorbidities Affecting Occupational Performance: May have comorbidities impacting occupational performance    Modification or Assistance to Complete Evaluation  Min-Moderate modification of tasks or assist with assess necessary to complete eval    OT Frequency 3x / week    OT Duration 12 weeks    OT Treatment/Interventions Therapeutic exercise;DME and/or AE instruction;Patient/family education;Therapeutic activities;Self-care/ADL training    Consulted and Agree with Plan of Care Patient             Patient will benefit from skilled therapeutic intervention in order to improve the following deficits and impairments:   Body Structure / Function / Physical Skills: ADL, IADL       Visit Diagnosis: Other lack of coordination  Low vision, both eyes    Problem List Patient Active Problem List   Diagnosis Date Noted   Thoracic back pain 11/01/2020   Somatic dysfunction of spine, thoracic 11/01/2020   Slow transit constipation    Morbid obesity ([facility])    Neck pain    ASD (atrial septal defect)    Ischemic cerebrovascular accident (CVA) of frontal lobe ([facility]) 09/13/2020   Stroke ([facility]) 09/10/2020   Lateral medullary syndrome    Vertebral artery dissection ([facility]) 09/09/2020   Obesity (BMI 35.0-39.9 without comorbidity) 09/09/2020   Essential hypertension 09/09/2020   Acute stroke of medulla oblongata ([facility])    Danelle Earthly, MS,  OTR/L  O[employer] Dials, OT/L 11/25/2020, 10:27 AM  [facility] MAIN [facility] SERVICES [address] [place], [place], [address] Phone: [phone]   Fax:  [phone]  Name: Curtis [name] MRN: [id number] Date of Birth: December 18, 1985

## 2020-11-26 ENCOUNTER — Encounter: Payer: BC Managed Care – PPO | Admitting: Occupational Therapy

## 2020-11-26 ENCOUNTER — Ambulatory Visit: Payer: BC Managed Care – PPO | Admitting: Physical Therapy

## 2020-11-27 ENCOUNTER — Ambulatory Visit: Payer: BC Managed Care – PPO | Admitting: Physical Therapy

## 2020-11-27 ENCOUNTER — Ambulatory Visit: Payer: BC Managed Care – PPO

## 2020-11-27 ENCOUNTER — Other Ambulatory Visit: Payer: Self-pay

## 2020-11-28 ENCOUNTER — Encounter: Payer: BC Managed Care – PPO | Admitting: Occupational Therapy

## 2020-11-28 ENCOUNTER — Ambulatory Visit: Payer: BC Managed Care – PPO

## 2020-12-02 ENCOUNTER — Other Ambulatory Visit: Payer: Self-pay | Admitting: Physical Medicine and Rehabilitation

## 2020-12-02 ENCOUNTER — Ambulatory Visit: Payer: BC Managed Care – PPO | Attending: Family Medicine | Admitting: Occupational Therapy

## 2020-12-02 ENCOUNTER — Encounter: Payer: Self-pay | Admitting: Occupational Therapy

## 2020-12-02 ENCOUNTER — Ambulatory Visit: Payer: BC Managed Care – PPO

## 2020-12-02 ENCOUNTER — Other Ambulatory Visit: Payer: Self-pay

## 2020-12-02 DIAGNOSIS — M6281 Muscle weakness (generalized): Secondary | ICD-10-CM | POA: Diagnosis not present

## 2020-12-02 DIAGNOSIS — R2681 Unsteadiness on feet: Secondary | ICD-10-CM

## 2020-12-02 DIAGNOSIS — R269 Unspecified abnormalities of gait and mobility: Secondary | ICD-10-CM | POA: Diagnosis not present

## 2020-12-02 DIAGNOSIS — R278 Other lack of coordination: Secondary | ICD-10-CM | POA: Insufficient documentation

## 2020-12-02 DIAGNOSIS — I7774 Dissection of vertebral artery: Secondary | ICD-10-CM | POA: Diagnosis not present

## 2020-12-02 DIAGNOSIS — F411 Generalized anxiety disorder: Secondary | ICD-10-CM | POA: Diagnosis not present

## 2020-12-02 DIAGNOSIS — R2689 Other abnormalities of gait and mobility: Secondary | ICD-10-CM | POA: Diagnosis not present

## 2020-12-02 DIAGNOSIS — R262 Difficulty in walking, not elsewhere classified: Secondary | ICD-10-CM | POA: Insufficient documentation

## 2020-12-02 DIAGNOSIS — H543 Unqualified visual loss, both eyes: Secondary | ICD-10-CM | POA: Insufficient documentation

## 2020-12-02 DIAGNOSIS — F4322 Adjustment disorder with anxiety: Secondary | ICD-10-CM | POA: Diagnosis not present

## 2020-12-02 MED ORDER — MECLIZINE HCL 50 MG PO TABS: 50.0000 mg | ORAL_TABLET | Freq: Three times a day (TID) | ORAL | 5 refills | Status: DC

## 2020-12-02 NOTE — Therapy (Signed)
[facility] MAIN [facility] SERVICES [address] [place], [place], [address] Phone: [phone]   Fax:  [phone]  Occupational Therapy Treatment  Patient Details  Name: Curtis [name] MRN: [id number] Date of Birth: 05-17-1985 Referring Provider (OT): [name]   Encounter Date: 12/02/2020   OT End of Session - 12/02/20 0850     Visit Number 13    Number of Visits 24    Date for OT Re-Evaluation 12/25/20    Authorization Time Period Progress report period starting 10/02/2020    OT Start Time 0845    OT Stop Time 0930    OT Time Calculation (min) 45 min    Activity Tolerance Patient tolerated treatment well    Behavior During Therapy [facility] for tasks assessed/performed             Past Medical History:  Diagnosis Date   Stroke [facility])     History reviewed. No pertinent surgical history.  There were no vitals filed for this visit.   Subjective Assessment - 12/02/20 0847     Subjective  Pt. reports that his dizziness in more noticable today due to holding his Meclizine.    Patient is accompanied by: Family member    Pertinent History Pt. is a 35 y.o. male who was diagnosed with Vertebral Artery Dissection, and Small Acute CVA. Pt. has Dizziness, nausea, diplopia, and fatigue. Pt.'s 1st child was born 3 months ago. Pt. 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    Currently in Pain? No/denies            OT TREATMENT    Pt. reports his dizziness is more noticeable today as he stopped taking the Meclizine for his Vestibular Rehab session today. Pt. worked on visual scanning tasks while incorporating typing text from a sheet positioned vertically to the right. Pt. Worked on shifting eye gaze from the left to the right while omitting double numbers, and letters form each row. Pt. was able to initiate the task, however had difficulty seeing the numbers, and letters at the beginning of each row about half way through the task. Pt. was  able to type a full page of word text positioned at the same vertical angle to the right without difficulty. Pt. worked worked on Building surveyor through Veterinary surgeon. Pt. Continues to work on improving visual scanning, and visual compensatory strategies for improved performance, and efficiency during ADLs, and IADL tasks.                         OT Education - 12/02/20 0850     Education Details HEP progression    Person(s) Educated Patient    Methods Explanation    Comprehension Verbalized understanding;Returned demonstration;Verbal cues required              OT Short Term Goals - 11/13/20 1525       OT SHORT TERM GOAL #1   Title Pt. will improve FOTO score by 3 points for clinically relevant change during ADLs, and IADL tasks.    Baseline 10th visit: FOTO: 76, TR 75 Eval: FOTO score 63 TR score 75    Time 12    Period Weeks    Status Achieved               OT Long Term Goals - 11/13/20 1525       OT LONG TERM GOAL #1   Title Pt. will improve UE  strength in order to be able to hold, and feed his baby with modified independence.    Baseline 10th visit: Pt. is able to hold, feed, and burp his daughter from a sitting posiiton.Eval: Pt. is unable to hold, and feed his baby.    Time 12    Period Weeks      OT LONG TERM GOAL #2   Title Pt. will perform light homemaking tasks with modified indepedence in standing    Baseline 10th: Pt. is able to perform laundry, dishes, making the bed, cooking, cleaning.EVal: Limited standing tolerance during home management tasks.    Time 12    Period Weeks    Status Achieved      OT LONG TERM GOAL #3   Title Pt. will independently demonstrate visual compensatory strategies 100% of the time during tabletop ADLs, and IADL tasks.    Baseline 10th visit: Pt. is improving with utilizing visual compensatory strategies  during tabletop tasks. Eval: Pt. has difficulty    Time 12    Period Weeks    Status On-going     Target Date 12/25/20      OT LONG TERM GOAL #4   Title Pt. will demonstrate visual compensatory strategies 100% of the time while performing IADL tasks within his extrpersonal space.    Baseline 10th visit: Pt. is improving with utilizing visual compensatory strategies when navigating through his environment. Eval: Pt. has difficulty    Time 12    Period Weeks    Status On-going    Target Date 12/25/20                   Plan - 12/02/20 0939     Clinical Impression Statement Pt. reports his dizziness is more noticeable today as he stopped taking the Meclizine for his Vestibular Rehab session today. Pt. worked on visual scanning tasks while incorporating typing text from a sheet positioned vertically to the right. Pt. worked on shifting eye gaze from the left to the right while omitting double numbers, and letters form each row. Pt. was able to initiate the task, however had difficulty seeing the numbers, and letters at the beginning of each row about half way through the task. Pt. was able to type a full page of word text positioned at the same vertical angle to the right without difficulty. Pt. worked worked on Building surveyor through Veterinary surgeon. Pt. Continues to work on improving visual scanning, and visual compensatory strategies for improved performance, and efficiency during ADLs, and IADL tasks.   OT Occupational Profile and History Detailed Assessment- Review of Records and additional review of physical, cognitive, psychosocial history related to current functional performance    Occupational performance deficits (Please refer to evaluation for details): IADL's;ADL's    Body Structure / Function / Physical Skills ADL;IADL    Rehab Potential Good    Clinical Decision Making Several treatment options, min-mod task modification necessary    Comorbidities Affecting Occupational Performance: May have comorbidities impacting occupational performance    Modification or  Assistance to Complete Evaluation  Min-Moderate modification of tasks or assist with assess necessary to complete eval    OT Frequency 3x / week    OT Duration 12 weeks    OT Treatment/Interventions Therapeutic exercise;DME and/or AE instruction;Patient/family education;Therapeutic activities;Self-care/ADL training    Consulted and Agree with Plan of Care Patient             Patient will benefit from skilled therapeutic intervention in order to  improve the following deficits and impairments:   Body Structure / Function / Physical Skills: ADL, IADL       Visit Diagnosis: Muscle weakness (generalized)  Other lack of coordination  Low vision, both eyes    Problem List Patient Active Problem List   Diagnosis Date Noted   Thoracic back pain 11/01/2020   Somatic dysfunction of spine, thoracic 11/01/2020   Slow transit constipation    Morbid obesity (HCC)    Neck pain    ASD (atrial septal defect)    Ischemic cerebrovascular accident (CVA) of frontal lobe (HCC) 09/13/2020   Stroke (HCC) 09/10/2020   Lateral medullary syndrome    Vertebral artery dissection (HCC) 09/09/2020   Obesity (BMI 35.0-39.9 without comorbidity) 09/09/2020   Essential hypertension 09/09/2020   Acute stroke of medulla oblongata (HCC)     Olegario Messier, MS, OTR/L 12/02/2020, 9:41 AM  [facility] MAIN [facility] SERVICES [address] [place], [place], [address] Phone: [phone]   Fax:  [phone]  Name: Curtis [name] MRN: [id number] Date of Birth: Feb 22, 1985

## 2020-12-02 NOTE — Therapy (Signed)
[place] MAIN [facility] SERVICES [address] [place], [place], [address] Phone: [phone]   Fax:  [phone]  Physical Therapy Treatment  Patient Details  Name: Curtis [name] MRN: [id number] Date of Birth: 1985/10/07 Referring Provider (PT): Dawayne Patricia   Encounter Date: 12/02/2020   PT End of Session - 12/02/20 1158     Visit Number 13    Number of Visits 24    Date for PT Re-Evaluation 12/25/20    Authorization Type [employer], no auth req    Authorization Time Period Cert 2/0/94-70/96/28    Progress Note Due on Visit 10    PT Start Time 0932    PT Stop Time 1015    PT Time Calculation (min) 43 min    Equipment Utilized During Treatment Gait belt    Activity Tolerance Patient tolerated treatment well    Behavior During Therapy WFL for tasks assessed/performed             Past Medical History:  Diagnosis Date   Stroke [facility])     History reviewed. No pertinent surgical history.  There were no vitals filed for this visit.      Subjective Assessment - 12/02/20 0934     Subjective Patient reports moderate dizziness today because he has not taken his medication. He denies falls or LOB since last session but does have more right shoulder pain/soreness (unrated) today.    Patient is accompained by: Family member   [name]   Pertinent History [name] is a 35 y.o. M with obesity no other significant PMHx presented to hospital  with acute onset nausea, dizziness. Patient had recently seen a chiropractor and being treated with cervical manipulation.  On the morning of admission he developed sudden dizziness, nausea, neck pain, and headache and so he came to the ER.    CT angiogram was obtained that showed findings consistent with dissection of the left vertebral artery.  MRI brain showed small ischemic infarct in the left lateral medulla. Pt had stay at inpatient rehab and has made significant progress in his funciton since  discharge. Pt also has 3 m.o. daughter he would like to be able to have a larger part in caring for but is restricted due to his impairments. at I.E. patient presented to therapy ambulating with rollator.    Limitations Lifting;Walking;Standing    How long can you sit comfortably? no issues with sitting    How long can you stand comfortably? 5-10 min    How long can you walk comfortably? 30 min (at grocery store)    Patient Stated Goals holding dighter, being able to return to golf    Currently in Pain? Yes    Pain Location Shoulder    Pain Orientation Right    Pain Onset In the past 7 days           Vestibular Testing/Neuro Re-Ed:  Dix hallpike testing: -Right Dix Hallpike; observed mild torsional component and patient rated 2-3/10 dizziness. -Left Dix Hallpike; no nystagmus observed, patient reports 1/10 "swirling" symptoms. -Dix hallpike repeated on right side and modified epley maneuver administered x2. Upon repeat, right DH produced mild torsional nystagmus but no upbeating component; patient reported dizziness so PT performed one trial Epley maneuver. Performed second right maneuver and noted potential direction changing nystagmus. - Pt reported worst dizziness when moving from sidelying to upright during Epley maneuver (5/10); symptoms resolved within 15sec once upright.  *All Marye Round and Modified Epley maneuvers performed  on inverted mat table.  VOR X 1 exercise: Demonstrated and educated as to VOR X1. Patient performed VOR X 1 horizontal in sitting 1 rep of 30 seconds and 1 rep of 1 minute each. Pt able to perform VOR X1 with good speed and reports the target is staying in focus. Patient reports 3/10 dizziness upon stopping after 30 second rep. Pt reports he had dizziness during the one minute rep that increased to 4/10 upon stopping. Patient states that he has a "swaying" sensation. Ambulation with head turns: Patient performed 50' trials of forwards ambulation with horizontal  head turns with CGA. Pt denied dizziness. Patient performed 53' trials of forwards ambulation with vertical head turns with CGA. Pt reports 4/10 dizziness which he states the dizziness mostly occurred upon stopping. Upon 2nd rep, patient reports 1/10 dizziness upon stopping and on 3rd rep reports 2-3/10 dizziness and pt reports that he feels "less coordinated". Upon 4th rep, pt reported 2/10 dizziness and a "swaying" sensation. Patient demonstrates no veering ambulation with vertical head turns but was observed to have small pause in his gait when looking down.   With gait belt and CGA:  - Walking in hall scanning side to side and calling out Numbers x1, Letters x1, Letters and symbols x1; no symptoms reported. - Walking in hall with vertical head nods; dizziness rated as 3/10 and worse when looking down. -Walking in hall with horizontal head turns; no symptoms reported. -Vertical head turns repeated x3 lengths of hallway with variable report of symptom severity.  -At parallel bars but cued to not use UE support:  - static standing on firm surface, narrow BOS, eyes closed and horizontal head turns; mild dizziness reported and worse upon opening eyes.  - static standing on firm surface, narrow BOS, eyes closed and vertical head nods; no dizziness reported, but increased AP sway and imbalance noted.    Patient presented to PT today with increased complaints of dizziness due to not taking meclizine this AM in preparation for Vestibular testing. Throughout testing, patient symptoms and nystagmus were inconsistent in presentation and severity. Patient tolerated testing and treatments well, however Epley maneuver did not appear to decrease symptoms. Future plans include increasing visual scanning tasks and further investigating dizziness symptoms as testing was inconclusive today. Patient will continue to benefit from skilled PT to address balance, strength, and functional mobility deficits to improve  safety with caretaker responsibilities, ambulation, and quality of life.                      PT Education - 12/02/20 1157     Education Details Plan of Care    Person(s) Educated Patient    Methods Explanation    Comprehension Verbalized understanding              PT Short Term Goals - 11/19/20 1359       PT SHORT TERM GOAL #1   Title Patient will be independent in home exercise program to improve strength/mobility for better functional independence with ADLs.    Baseline Pt has HEP from hospital discharge but it is not progressive, 10/25: doing exercises every other day    Time 4    Period Weeks    Status Achieved    Target Date 10/30/20      PT SHORT TERM GOAL #2   Title Patient (< 11 years old) will complete five times sit to stand test in < 15 seconds indicating an increased LE strength and improved  balance.    Baseline >20 sec, 11.6 seconds    Time 4    Period Weeks    Status Achieved    Target Date 10/30/20      PT SHORT TERM GOAL #3   Title Patient will increase six minute walk test distance to >1200 with LRAD for progression to community ambulator and improve gait ability    Baseline 1015 feet with rollator, 10/25: 1450 feet without AD    Time 4    Period Weeks    Status Achieved    Target Date 10/30/20               PT Long Term Goals - 11/19/20 1410       PT LONG TERM GOAL #1   Title Patient will increase FOTO score to equal to or greater than 15    to demonstrate statistically significant improvement in mobility and quality of life.    Baseline 63.5, 10/25: 61%    Time 12    Period Weeks    Status On-going    Target Date 12/25/20      PT LONG TERM GOAL #2   Title Patient will increase Functional Gait Assessment score to >25/30 as to reduce fall risk and improve dynamic gait safety with community ambulation.    Baseline 20, 10/25: 23/30    Time 12    Period Weeks    Status Partially Met    Target Date 12/25/20      PT  LONG TERM GOAL #3   Title Patient will ascend/descend 4 stairs without rail assist independently without loss of balance to improve ability to get in/out of home.    Baseline requires use of handrails to safely naigate stairs, 10/25: requires 1 rail assist when descending;    Time 12    Period Weeks    Status Partially Met    Target Date 12/25/20      PT LONG TERM GOAL #4   Title Patient will increase six minute walk test distance to >1800 feet without AD for progression to community ambulator and improve gait ability    Baseline 1015 with rollator, 10/25: 1450    Time 12    Period Weeks    Status Revised    Target Date 12/25/20      PT LONG TERM GOAL #5   Title Patient will improve UE shoulder/scapular strength to 4/5 or greater to improve shoulder stability and safety when lifting daughter or doing ADLs.    Baseline grossly 3/5    Time 6    Period Weeks    Status New    Target Date 12/25/20                   Plan - 12/02/20 1159     Clinical Impression Statement Patient presented to PT today with increased complaints of dizziness due to not taking meclizine this AM in preparation for Vestibular testing. Throughout testing, patient symptoms and nystagmus were inconsistent in presentation and severity. Patient tolerated testing and treatments well, however Epley maneuver did not appear to decrease symptoms. Patient will continue to benefit from skilled PT to address balance, strength, and functional mobility deficits to improve safety with caretaker responsibilities, ambulation, and quality of life.    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 complexity  Rehab Potential Excellent    PT Frequency  2x / week    PT Duration 12 weeks    PT Treatment/Interventions ADLs/Self Care Home Management;Neuromuscular re-education;Patient/family education;Manual techniques;Passive range of motion;Dry needling;Energy conservation;Vestibular;Visual/perceptual remediation/compensation;Therapeutic activities;Functional mobility training;Therapeutic exercise;Balance training;Gait training;Canalith Repostioning    PT Next Visit Plan MiniBest or other outcome measure, develop and provide HEP,  consider use of mirror to assist with  finding neutral, progress VOR and balance training with head turns.    PT Home Exercise Plan Access Code: [email or web address]; 10/31/2020=Access Code: [email or web address]             Patient will benefit from skilled therapeutic intervention in order to improve the following deficits and impairments:  Abnormal gait, Decreased activity tolerance, Decreased balance, Decreased coordination, Decreased endurance, Decreased mobility, Decreased range of motion, Decreased strength, Difficulty walking, Dizziness, Impaired perceived functional ability, Impaired vision/preception  Visit Diagnosis: Other lack of coordination  Difficulty in walking, not elsewhere classified  Unsteadiness on feet     Problem List Patient Active Problem List   Diagnosis Date Noted   Thoracic back pain 11/01/2020   Somatic dysfunction of spine, thoracic 11/01/2020   Slow transit constipation    Morbid obesity ([place])    Neck pain    ASD (atrial septal defect)    Ischemic cerebrovascular accident (CVA) of frontal lobe ([place]) 09/13/2020   Stroke ([place]) 09/10/2020   Lateral medullary syndrome    Vertebral artery dissection ([place]) 09/09/2020   Obesity (BMI 35.0-39.9 without comorbidity) 09/09/2020   Essential hypertension 09/09/2020   Acute stroke of medulla oblongata ([place])    Arsenio Katz, SPT  This entire session was performed under direct supervision and direction of a licensed Chiropractor . I have  personally read, edited and approve of the note as written.  Janna Arch, PT, DPT  12/02/2020, 12:05 PM  [place] MAIN [facility] SERVICES [address] [place], [place], [address] Phone: [phone]   Fax:  [phone]  Name: Curtis [name] MRN: [id number] Date of Birth: 1985-03-02

## 2020-12-03 ENCOUNTER — Encounter: Payer: BC Managed Care – PPO | Admitting: Occupational Therapy

## 2020-12-03 ENCOUNTER — Other Ambulatory Visit: Payer: Self-pay | Admitting: Adult Health

## 2020-12-03 ENCOUNTER — Ambulatory Visit: Payer: BC Managed Care – PPO

## 2020-12-03 DIAGNOSIS — I639 Cerebral infarction, unspecified: Secondary | ICD-10-CM

## 2020-12-03 DIAGNOSIS — I7774 Dissection of vertebral artery: Secondary | ICD-10-CM

## 2020-12-04 ENCOUNTER — Other Ambulatory Visit: Payer: Self-pay

## 2020-12-04 ENCOUNTER — Ambulatory Visit: Payer: BC Managed Care – PPO | Admitting: Physical Therapy

## 2020-12-04 ENCOUNTER — Encounter: Payer: Self-pay | Admitting: Physical Therapy

## 2020-12-04 DIAGNOSIS — R269 Unspecified abnormalities of gait and mobility: Secondary | ICD-10-CM

## 2020-12-04 DIAGNOSIS — R278 Other lack of coordination: Secondary | ICD-10-CM | POA: Diagnosis not present

## 2020-12-04 DIAGNOSIS — R262 Difficulty in walking, not elsewhere classified: Secondary | ICD-10-CM | POA: Diagnosis not present

## 2020-12-04 DIAGNOSIS — H543 Unqualified visual loss, both eyes: Secondary | ICD-10-CM | POA: Diagnosis not present

## 2020-12-04 DIAGNOSIS — I7774 Dissection of vertebral artery: Secondary | ICD-10-CM | POA: Diagnosis not present

## 2020-12-04 DIAGNOSIS — M6281 Muscle weakness (generalized): Secondary | ICD-10-CM

## 2020-12-04 DIAGNOSIS — R2681 Unsteadiness on feet: Secondary | ICD-10-CM

## 2020-12-04 DIAGNOSIS — R2689 Other abnormalities of gait and mobility: Secondary | ICD-10-CM | POA: Diagnosis not present

## 2020-12-04 NOTE — Therapy (Signed)
[place] MAIN [facility] SERVICES [address] [place], [place], [address] Phone: [phone]   Fax:  [phone]  Physical Therapy Treatment  Patient Details  Name: Curtis [name] MRN: [id number] Date of Birth: [birth year] Referring Provider (PT): Dawayne Patricia   Encounter Date: 12/04/2020   PT End of Session - 12/04/20 1401     Visit Number 14    Number of Visits 24    Date for PT Re-Evaluation 12/25/20    Authorization Type BCBS, no auth req    Authorization Time Period Cert 09/30/16-84/16/60    Progress Note Due on Visit 10    PT Start Time 1000    PT Stop Time 1044    PT Time Calculation (min) 44 min    Equipment Utilized During Treatment Gait belt    Activity Tolerance Patient tolerated treatment well    Behavior During Therapy WFL for tasks assessed/performed             Past Medical History:  Diagnosis Date   Stroke [facility])     History reviewed. No pertinent surgical history.  There were no vitals filed for this visit.   Subjective Assessment - 12/04/20 1001     Subjective Patient reports dizziness this morning although he did take Meclizine. He also states his vision is blurry and he is having difficulty focusing. Pt states he continues to have R shoulder soreness. Denies falls/LOB. He states that since he stopped taking Gabapentin last week, he is having pain in MCP joint of his R 1st digit.    Patient is accompained by: Family member   [name]   Pertinent History Mr. Cassatt is a 35 y.o. M with obesity no other significant PMHx presented to hospital  with acute onset nausea, dizziness. Patient had recently seen a chiropractor and being treated with cervical manipulation.  On the morning of admission he developed sudden dizziness, nausea, neck pain, and headache and so he came to the ER.    CT angiogram was obtained that showed findings consistent with dissection of the left vertebral artery.  MRI brain showed small  ischemic infarct in the left lateral medulla. Pt had stay at inpatient rehab and has made significant progress in his funciton since discharge. Pt also has 3 m.o. daughter he would like to be able to have a larger part in caring for but is restricted due to his impairments. at I.E. patient presented to therapy ambulating with rollator.    Limitations Lifting;Walking;Standing    How long can you sit comfortably? no issues with sitting    How long can you stand comfortably? 5-10 min    How long can you walk comfortably? 30 min (at grocery store)    Patient Stated Goals holding daughter, being able to return to golf    Currently in Pain? No/denies    Pain Onset In the past 7 days             Treatment:   Supine:  Scapular punches; 5lb dumbbell 2x12 BUE; tactile cueing  Scapular stability via perturbations while holding 5lb dumbbells, 2x1 minute BUE.    Seated on stool, fully lowered:   Sit to stand 2x10 with weighted orange ball in LUE (first set) and RUE (second set)  Matrix standing row, 2x12 at 32.5lb   Standing on airex pad in // bars; challenged with no UE support:  Wide stance, horizontal head turns (small amplitude) - 30 seconds Wide stance, horizontal head turns (large amplitude) -  30 seconds Wide stance, vertical head turns - 30 seconds  Narrow stance, horizontal head turns (small amplitude) - 30 seconds Narrow stance, horizontal head turns (large amplitude) - 30 seconds Narrow stance, vertical head turns - 30 seconds  Semi-tandem stance, horizontal head turns (small amplitude) - 30 seconds, each side Semi-tandem stance, horizontal head turns (large amplitude) - 30 seconds, each side Semi-tandem stance, vertical head turns - 30 seconds, each side Tandem stance, horizontal head turns (small amplitude) - 30 seconds, each side Tandem stance, horizontal head turns (large amplitude) - 30 seconds, each side Tandem stance, vertical head turns - 30 seconds, each side *seated rest  break during tandem stance to stretch calves due to cramping in lateral L foot. *3 seated rest breaks taken during standing exercises  SLS - attempted 90 seconds each side with occasional to down for stability Heel raises with 3 second hold, 2x10 reps.   Pt educated throughout session about proper posture and technique with exercises. Improved exercise technique, movement at target joints, use of target muscles after min to mod verbal, visual, tactile cues.     Patient demonstrates excellent motivation throughout session today. After previous session and discovery of difficulty with head turns, PT incorporated multiple balance exercises with both horizontal and vertical head turns. He was challenged with vertical more than horizontal however horizontal still proved to be difficult especially with a narrowed BOS. PT noticed increased supination of R ankle on airex pad, increased with fatigue. Decreased range within heel lifts (final exercise) due to muscular fatigue. He would benefit from additional skilled PT Intervention to improve proximal scapular strength, improve ankle stabilization and improve high level balance tasks for return to PLOF.            PT Short Term Goals - 11/19/20 1359       PT SHORT TERM GOAL #1   Title Patient will be independent in home exercise program to improve strength/mobility for better functional independence with ADLs.    Baseline Pt has HEP from hospital discharge but it is not progressive, 10/25: doing exercises every other day    Time 4    Period Weeks    Status Achieved    Target Date 10/30/20      PT SHORT TERM GOAL #2   Title Patient (< 42 years old) will complete five times sit to stand test in < 15 seconds indicating an increased LE strength and improved balance.    Baseline >20 sec, 11.6 seconds    Time 4    Period Weeks    Status Achieved    Target Date 10/30/20      PT SHORT TERM GOAL #3   Title Patient will increase six minute walk  test distance to >1200 with LRAD for progression to community ambulator and improve gait ability    Baseline 1015 feet with rollator, 10/25: 1450 feet without AD    Time 4    Period Weeks    Status Achieved    Target Date 10/30/20               PT Long Term Goals - 11/19/20 1410       PT LONG TERM GOAL #1   Title Patient will increase FOTO score to equal to or greater than 15    to demonstrate statistically significant improvement in mobility and quality of life.    Baseline 63.5, 10/25: 61%    Time 12    Period Weeks    Status On-going  Target Date 12/25/20      PT LONG TERM GOAL #2   Title Patient will increase Functional Gait Assessment score to >25/30 as to reduce fall risk and improve dynamic gait safety with community ambulation.    Baseline 20, 10/25: 23/30    Time 12    Period Weeks    Status Partially Met    Target Date 12/25/20      PT LONG TERM GOAL #3   Title Patient will ascend/descend 4 stairs without rail assist independently without loss of balance to improve ability to get in/out of home.    Baseline requires use of handrails to safely naigate stairs, 10/25: requires 1 rail assist when descending;    Time 12    Period Weeks    Status Partially Met    Target Date 12/25/20      PT LONG TERM GOAL #4   Title Patient will increase six minute walk test distance to >1800 feet without AD for progression to community ambulator and improve gait ability    Baseline 1015 with rollator, 10/25: 1450    Time 12    Period Weeks    Status Revised    Target Date 12/25/20      PT LONG TERM GOAL #5   Title Patient will improve UE shoulder/scapular strength to 4/5 or greater to improve shoulder stability and safety when lifting daughter or doing ADLs.    Baseline grossly 3/5    Time 6    Period Weeks    Status New    Target Date 12/25/20                   Plan - 12/04/20 1402     Clinical Impression Statement Patient demonstrates excellent  motivation throughout session today. After previous session and discovery of difficulty with head turns, PT incorporated multiple balance exercises with both horizontal and vertical head turns. He was challenged with vertical more than horizontal however horizontal still proved to be difficult especially with a narrowed BOS. PT noticed increased supination of R ankle on airex pad, increased with fatigue. Decreased range within heel lifts (final exercise) due to muscular fatigue. He would benefit from additional skilled PT Intervention to improve proximal scapular strength, improve ankle stabilization and improve high level balance tasks for return to PLOF.    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 complexity    Rehab Potential Excellent    PT Frequency 2x / week    PT Duration 12 weeks    PT Treatment/Interventions ADLs/Self Care Home Management;Neuromuscular re-education;Patient/family education;Manual techniques;Passive range of motion;Dry needling;Energy conservation;Vestibular;Visual/perceptual remediation/compensation;Therapeutic activities;Functional mobility training;Therapeutic exercise;Balance training;Gait training;Canalith Repostioning    PT Next Visit Plan MiniBest or other outcome measure, develop and provide HEP,  consider use of mirror to assist with  finding neutral, progress VOR and balance training with head turns.    PT Home Exercise Plan Access Code: [email or web address]; 10/31/2020=Access Code: [email or web address]             Patient will benefit from skilled therapeutic intervention in order to improve the following deficits and impairments:  Abnormal gait, Decreased activity tolerance, Decreased balance, Decreased  coordination, Decreased endurance, Decreased mobility, Decreased range of motion, Decreased strength, Difficulty walking, Dizziness, Impaired perceived functional ability, Impaired vision/preception  Visit Diagnosis: Abnormality of gait and mobility  Other  abnormalities of gait and mobility  Difficulty in walking, not elsewhere classified  Other lack of coordination  Muscle weakness (generalized)  Unsteadiness on feet     Problem List Patient Active Problem List   Diagnosis Date Noted   Thoracic back pain 11/01/2020   Somatic dysfunction of spine, thoracic 11/01/2020   Slow transit constipation    Morbid obesity ([place])    Neck pain    ASD (atrial septal defect)    Ischemic cerebrovascular accident (CVA) of frontal lobe ([place]) 09/13/2020   Stroke ([place]) 09/10/2020   Lateral medullary syndrome    Vertebral artery dissection ([place]) 09/09/2020   Obesity (BMI 35.0-39.9 without comorbidity) 09/09/2020   Essential hypertension 09/09/2020   Acute stroke of medulla oblongata ([place])     Patrina Levering PT, [place] [address] [place], [place], [address] Phone: [phone]   Fax:  [phone]  Name: HASHIM EICHHORST MRN: [id number] Date of Birth: Jun 10, 1985

## 2020-12-05 ENCOUNTER — Ambulatory Visit: Payer: BC Managed Care – PPO

## 2020-12-09 ENCOUNTER — Ambulatory Visit: Payer: BC Managed Care – PPO

## 2020-12-09 ENCOUNTER — Other Ambulatory Visit: Payer: Self-pay

## 2020-12-09 DIAGNOSIS — R2689 Other abnormalities of gait and mobility: Secondary | ICD-10-CM | POA: Diagnosis not present

## 2020-12-09 DIAGNOSIS — R269 Unspecified abnormalities of gait and mobility: Secondary | ICD-10-CM | POA: Diagnosis not present

## 2020-12-09 DIAGNOSIS — H543 Unqualified visual loss, both eyes: Secondary | ICD-10-CM | POA: Diagnosis not present

## 2020-12-09 DIAGNOSIS — I7774 Dissection of vertebral artery: Secondary | ICD-10-CM | POA: Diagnosis not present

## 2020-12-09 DIAGNOSIS — M6281 Muscle weakness (generalized): Secondary | ICD-10-CM

## 2020-12-09 DIAGNOSIS — R278 Other lack of coordination: Secondary | ICD-10-CM | POA: Diagnosis not present

## 2020-12-09 DIAGNOSIS — R2681 Unsteadiness on feet: Secondary | ICD-10-CM | POA: Diagnosis not present

## 2020-12-09 DIAGNOSIS — R262 Difficulty in walking, not elsewhere classified: Secondary | ICD-10-CM | POA: Diagnosis not present

## 2020-12-09 NOTE — Therapy (Signed)
[place] MAIN [facility] SERVICES [address] [place], [place], [address] Phone: [phone]   Fax:  [phone]  Physical Therapy Treatment  Patient Details  Name: Curtis [name] MRN: [id number] Date of Birth: [birth year] Referring Provider (PT): Curtis [name]   Encounter Date: 12/09/2020   PT End of Session - 12/09/20 1016     Visit Number 15    Number of Visits 24    Date for PT Re-Evaluation 12/25/20    Authorization Type BCBS, no auth req    Authorization Time Period Cert 0/2/54-27/06/23    Progress Note Due on Visit 10    PT Start Time 0933    PT Stop Time 1016    PT Time Calculation (min) 43 min    Equipment Utilized During Treatment Gait belt    Activity Tolerance Patient tolerated treatment well    Behavior During Therapy WFL for tasks assessed/performed             Past Medical History:  Diagnosis Date   Stroke Curtis [name])     History reviewed. No pertinent surgical history.  There were no vitals filed for this visit.   Subjective Assessment - 12/09/20 0935     Subjective Patient reports mild dizziness 2/10. No falls or LOB since last visit. He says shoulder pain has gone away, but he does notice occasional tingling/numbness in RUE that increases when he moves it or puts pressure on it.    Patient is accompained by: Family member   Curtis [name]   Pertinent History Curtis [name] is a 35 y.o. M with obesity no other significant PMHx presented to hospital  with acute onset nausea, dizziness. Patient had recently seen a chiropractor and being treated with cervical manipulation.  On the morning of admission he developed sudden dizziness, nausea, neck pain, and headache and so he came to the ER.    CT angiogram was obtained that showed findings consistent with dissection of the left vertebral artery.  MRI brain showed small ischemic infarct in the left lateral medulla. Pt had stay at inpatient rehab and has made significant progress  in his funciton since discharge. Pt also has 3 m.o. daughter he would like to be able to have a larger part in caring for but is restricted due to his impairments. at I.E. patient presented to therapy ambulating with rollator.    Limitations Lifting;Walking;Standing    How long can you sit comfortably? no issues with sitting    How long can you stand comfortably? 5-10 min    How long can you walk comfortably? 30 min (at grocery store)    Patient Stated Goals holding daughter, being able to return to golf    Currently in Pain? No/denies    Pain Onset In the past 7 days                TherEx:  STS training from soft, lowered Mat table 1x30sec, mild fatigue reported towards end 1x30sec with 4 in step beneath feet to increase depth 1x30sec with 4 in step beneath feet and pillow on table to increase unstable surface. Forward lunge onto half BOSU, blue side up, 1x12 each side, Ankle instability increased when lunging on RLE. Pallof press on cable machine x15# 2x10 Each side Modified Bird Dogs, cuing for core and scapular engagement to focus on stabilizing shoulder girdle. 2x10 alternating arm extension. Reported moderate tingling/numbness in RUE In quadraped. Dumbbells used for neutral grip in second round.  Neuro Re-Ed: Standing  scapular lift offs; Facing wall, SA lift offs in shoulder scaption/elevation 2x12 each side. Decreased mobility/increased tightness noted in LUE. Lunge on half BOSU; ball tosses x35mn each side.  KoreBalance; PHunts[employer] Corporationfor encouraging multidirectional weight shift, visual tracking, and reaction time. X459m  Upper limb tension test, median nerve bias performed on RUE after complaints of numbness reported. Mild neural tension felt, but distal component did not affect symptoms. Median nerve glide flossing was performed but patient did not report consistent increase or relief of symptoms with glides.  Gross UE sensory discrimination testing performed in sitting;  patient can discriminate sharp/dull on BUE but reports general decreased sensation on RUE compared to LUE.   Pt educated throughout session about proper posture and technique with exercises. Improved exercise technique, movement at target joints, use of target muscles after min to mod verbal, visual, tactile cues.   Patient demonstrates great effort and tolerance to increased balance and core strengthening throughout session today. Patient reported increased numbness in RUE during closed chain tasks, up to 8/10 tingling (non painful) that dissipates quickly upon stopping activity. ULTT was negative and nerve glides did not consistently reproduce symptoms. Patient will continue to benefit from skilled PT to improve dynamic balance and strengthening to increase safety as caregiver, promote independence and return to prior level of function.                        PT Education - 12/09/20 1134     Education Details Exercise Technique, sensory and neural tension testing.    Person(s) Educated Patient    Methods Explanation;Demonstration;Tactile cues;Verbal cues    Comprehension Verbal cues required;Returned demonstration;Verbalized understanding;Tactile cues required              PT Short Term Goals - 11/19/20 1359       PT SHORT TERM GOAL #1   Title Patient will be independent in home exercise program to improve strength/mobility for better functional independence with ADLs.    Baseline Pt has HEP from hospital discharge but it is not progressive, 10/25: doing exercises every other day    Time 4    Period Weeks    Status Achieved    Target Date 10/30/20      PT SHORT TERM GOAL #2   Title Patient (< [age]ears old) will complete five times sit to stand test in < 15 seconds indicating an increased LE strength and improved balance.    Baseline >20 sec, 11.6 seconds    Time 4    Period Weeks    Status Achieved    Target Date 10/30/20      PT SHORT TERM GOAL #3    Title Patient will increase six minute walk test distance to >1200 with LRAD for progression to community ambulator and improve gait ability    Baseline 1015 feet with rollator, 10/25: 1450 feet without AD    Time 4    Period Weeks    Status Achieved    Target Date 10/30/20               PT Long Term Goals - 11/19/20 1410       PT LONG TERM GOAL #1   Title Patient will increase FOTO score to equal to or greater than 15    to demonstrate statistically significant improvement in mobility and quality of life.    Baseline 63.5, 10/25: 61%    Time 12    Period Weeks  Status On-going    Target Date 12/25/20      PT LONG TERM GOAL #2   Title Patient will increase Functional Gait Assessment score to >25/30 as to reduce fall risk and improve dynamic gait safety with community ambulation.    Baseline 20, 10/25: 23/30    Time 12    Period Weeks    Status Partially Met    Target Date 12/25/20      PT LONG TERM GOAL #3   Title Patient will ascend/descend 4 stairs without rail assist independently without loss of balance to improve ability to get in/out of home.    Baseline requires use of handrails to safely naigate stairs, 10/25: requires 1 rail assist when descending;    Time 12    Period Weeks    Status Partially Met    Target Date 12/25/20      PT LONG TERM GOAL #4   Title Patient will increase six minute walk test distance to >1800 feet without AD for progression to community ambulator and improve gait ability    Baseline 1015 with rollator, 10/25: 1450    Time 12    Period Weeks    Status Revised    Target Date 12/25/20      PT LONG TERM GOAL #5   Title Patient will improve UE shoulder/scapular strength to 4/5 or greater to improve shoulder stability and safety when lifting daughter or doing ADLs.    Baseline grossly 3/5    Time 6    Period Weeks    Status New    Target Date 12/25/20                   Plan - 12/09/20 1137     Clinical Impression  Statement Patient gave great effort and tolerated increased balance and core strengthening well  today. Patient reported increased numbness in RUE during closed chain tasks, up to 8/10 tingling (non painful) that dissipates quickly upon stopping activity. ULTT was negative and nerve glides did not consistently reproduce symptoms. Patient will continue to benefit from skilled PT to improve dynamic balance and strengthening to increase safety as caregiver, promote independence and return to prior level of function.    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 complexity    Rehab Potential Excellent    PT Frequency 2x / week    PT Duration 12 weeks    PT Treatment/Interventions ADLs/Self Care Home Management;Neuromuscular re-education;Patient/family education;Manual techniques;Passive range of motion;Dry needling;Energy conservation;Vestibular;Visual/perceptual remediation/compensation;Therapeutic activities;Functional mobility training;Therapeutic exercise;Balance training;Gait training;Canalith Repostioning    PT Next Visit Plan Increase scapular strengthening and dynamic balance on uneven surfaces, develop HEP.    PT Home Exercise Plan Access Code: [email or web address]; 10/31/2020=Access Code: [email or web address]             Patient will benefit from skilled therapeutic intervention in order to improve the following deficits and impairments:  Abnormal gait, Decreased activity tolerance, Decreased balance, Decreased coordination, Decreased endurance, Decreased mobility, Decreased range of motion, Decreased strength, Difficulty walking, Dizziness, Impaired perceived functional ability, Impaired vision/preception  Visit Diagnosis: Other lack of  coordination  Other abnormalities of gait and mobility  Muscle weakness (generalized)     Problem List Patient Active Problem List   Diagnosis Date Noted   Thoracic back pain 11/01/2020   Somatic dysfunction of spine, thoracic  11/01/2020   Slow transit constipation    Morbid obesity ([facility])    Neck pain    ASD (atrial septal defect)    Ischemic cerebrovascular accident (CVA) of frontal lobe ([place]) 09/13/2020   Stroke ([place]) 09/10/2020   Lateral medullary syndrome    Vertebral artery dissection ([place]) 09/09/2020   Obesity (BMI 35.0-39.9 without comorbidity) 09/09/2020   Essential hypertension 09/09/2020   Acute stroke of medulla oblongata ([place])    Curtis [name], SPT   This entire session was performed under direct supervision and direction of a licensed therapist/therapist assistant . I have personally read, edited and approve of the note as written.  Janna Arch, PT, DPT  12/09/2020, 3:22 PM  [place] MAIN [facility] SERVICES [address] [place], [place], [address] Phone: [phone]   Fax:  [phone]  Name: Curtis [name] MRN: [id number] Date of Birth: 1985/08/29

## 2020-12-10 ENCOUNTER — Ambulatory Visit: Payer: BC Managed Care – PPO

## 2020-12-10 ENCOUNTER — Encounter: Payer: BC Managed Care – PPO | Admitting: Occupational Therapy

## 2020-12-10 NOTE — Telephone Encounter (Signed)
Curtis [name]: [id number] (exp. 12/02/20 to 01/30/21) patient is scheduled at GI for 12/27/20.

## 2020-12-11 ENCOUNTER — Other Ambulatory Visit: Payer: Self-pay

## 2020-12-11 ENCOUNTER — Ambulatory Visit: Payer: BC Managed Care – PPO

## 2020-12-11 DIAGNOSIS — R262 Difficulty in walking, not elsewhere classified: Secondary | ICD-10-CM

## 2020-12-11 DIAGNOSIS — R2681 Unsteadiness on feet: Secondary | ICD-10-CM

## 2020-12-11 DIAGNOSIS — H543 Unqualified visual loss, both eyes: Secondary | ICD-10-CM | POA: Diagnosis not present

## 2020-12-11 DIAGNOSIS — M6281 Muscle weakness (generalized): Secondary | ICD-10-CM | POA: Diagnosis not present

## 2020-12-11 DIAGNOSIS — R269 Unspecified abnormalities of gait and mobility: Secondary | ICD-10-CM

## 2020-12-11 DIAGNOSIS — R2689 Other abnormalities of gait and mobility: Secondary | ICD-10-CM | POA: Diagnosis not present

## 2020-12-11 DIAGNOSIS — I7774 Dissection of vertebral artery: Secondary | ICD-10-CM | POA: Diagnosis not present

## 2020-12-11 DIAGNOSIS — R278 Other lack of coordination: Secondary | ICD-10-CM | POA: Diagnosis not present

## 2020-12-11 NOTE — Therapy (Signed)
[facility] [address] [place], [place], [address] Phone: [phone]   Fax:  [phone]  Physical Therapy Treatment  Patient Details  Name: Curtis [name] MRN: [id number] Date of Birth: 21-Sep-1985 Referring Provider (PT): Dawayne Patricia   Encounter Date: 12/11/2020   PT End of Session - 12/11/20 1111     Visit Number 16    Number of Visits 24    Date for PT Re-Evaluation 12/25/20    Authorization Type [employer], no auth req    Authorization Time Period Cert 10/02/00-63/78/58    Progress Note Due on Visit 10    PT Start Time 0932    PT Stop Time 1011    PT Time Calculation (min) 39 min    Equipment Utilized During Treatment Gait belt    Activity Tolerance Patient tolerated treatment well    Behavior During Therapy WFL for tasks assessed/performed             Past Medical History:  Diagnosis Date   Stroke [facility])     History reviewed. No pertinent surgical history.  There were no vitals filed for this visit.   Subjective Assessment - 12/11/20 1114     Subjective Patient reports feeling better overall with less dizziness and no scapular pain. He states he has been able to get up and down from floor and play with his daughter. Reports he feels like balance is the biggest issue at this point.    Patient is accompained by: Family member   [name]   Pertinent History [name] is a 35 y.o. M with obesity no other significant PMHx presented to hospital  with acute onset nausea, dizziness. Patient had recently seen a chiropractor and being treated with cervical manipulation.  On the morning of admission he developed sudden dizziness, nausea, neck pain, and headache and so he came to the ER.    CT angiogram was obtained that showed findings consistent with dissection of the left vertebral artery.  MRI brain showed small ischemic infarct in the left lateral medulla. Pt had stay at inpatient rehab and has made  significant progress in his funciton since discharge. Pt also has 3 m.o. daughter he would like to be able to have a larger part in caring for but is restricted due to his impairments. at [facility] patient presented to therapy ambulating with rollator.    Limitations Lifting;Walking;Standing    How long can you sit comfortably? no issues with sitting    How long can you stand comfortably? 5-10 min    How long can you walk comfortably? 30 min (at grocery store)    Patient Stated Goals holding daughter, being able to return to golf    Currently in Pain? No/denies    Pain Onset In the past 7 days                Interventions:  Wall squats x 20 sec hold x 3 (at 90 deg hip/knee or below) - Patient reports "The first 15 sec is medium but the last 5 are hard."  lunge squat with trunk twist-  Left to right and alternating BLE   Standing on airex pads (one in front of the other) and trunk twist-  x 12 reps each direction- Patient experienced increased ankle instability (right more unstable than left yet no LOB)  (Standing with 1 foot on pad and opp foot directly behind on another pad) - tandem standing x 30 sec x 2  each leg. Then progressed to dynamic head motion:  -Head turn with EO x 5 then EC x 5 - Unsteady yet no LOB -Vertical head turn x 5 with EO and 5 with EC- Increased unsteadiness.      Floor to stand transfer:  Start in quadraped- Transition to tall kneeling- then 1/2 kneeling and then lean forward with 1 UE support-push up to standing. Patient instructed in technique with VC and visual Demo and then able to return demo performing 2 trials - one with left LE as power leg then next with Right leg- Patient able to perform well- independent without any assistance or support.    [place]ore balance machine: Dynamic training - follow the red ball - patient able to track and dynamic weight shift with hips/ankles.  Maze game- Patient performed 2 trials of dynamic balance completing maze- VC for  weight shift. Patient performed well but did require UE support.   Education provided throughout session via VC/TC and demonstration to facilitate movement at target joints and correct muscle activation for all testing and exercises performed.   Clinical Impression: Patient presented with good motivation and reports dizziness and back pain were much improved. He performed well - challenged with LE strengthening/balance activities yet no significant LOB and no report of dizziness or pain. Patient exhibited no difficulty with floor to stand transfers and reports he has been able to get down on floor to play with his daughter. Patient will continue to benefit from skilled PT to improve dynamic balance and strengthening to increase safety as caregiver, promote independence and return to prior level of function.            PT Education - 12/11/20 1115     Education Details Exercise technique    Person(s) Educated Patient    Methods Explanation;Demonstration;Tactile cues;Verbal cues    Comprehension Verbalized understanding;Returned demonstration;Verbal cues required;Tactile cues required;Need further instruction              PT Short Term Goals - 11/19/20 1359       PT SHORT TERM GOAL #1   Title Patient will be independent in home exercise program to improve strength/mobility for better functional independence with ADLs.    Baseline Pt has HEP from hospital discharge but it is not progressive, 10/25: doing exercises every other day    Time 4    Period Weeks    Status Achieved    Target Date 10/30/20      PT SHORT TERM GOAL #2   Title Patient (< 54 years old) will complete five times sit to stand test in < 15 seconds indicating an increased LE strength and improved balance.    Baseline >20 sec, 11.6 seconds    Time 4    Period Weeks    Status Achieved    Target Date 10/30/20      PT SHORT TERM GOAL #3   Title Patient will increase six minute walk test distance to >1200 with  LRAD for progression to community ambulator and improve gait ability    Baseline 1015 feet with rollator, 10/25: 1450 feet without AD    Time 4    Period Weeks    Status Achieved    Target Date 10/30/20               PT Long Term Goals - 11/19/20 1410       PT LONG TERM GOAL #1   Title Patient will increase FOTO score to equal to or greater  than 15    to demonstrate statistically significant improvement in mobility and quality of life.    Baseline 63.5, 10/25: 61%    Time 12    Period Weeks    Status On-going    Target Date 12/25/20      PT LONG TERM GOAL #2   Title Patient will increase Functional Gait Assessment score to >25/30 as to reduce fall risk and improve dynamic gait safety with community ambulation.    Baseline 20, 10/25: 23/30    Time 12    Period Weeks    Status Partially Met    Target Date 12/25/20      PT LONG TERM GOAL #3   Title Patient will ascend/descend 4 stairs without rail assist independently without loss of balance to improve ability to get in/out of home.    Baseline requires use of handrails to safely naigate stairs, 10/25: requires 1 rail assist when descending;    Time 12    Period Weeks    Status Partially Met    Target Date 12/25/20      PT LONG TERM GOAL #4   Title Patient will increase six minute walk test distance to >1800 feet without AD for progression to community ambulator and improve gait ability    Baseline 1015 with rollator, 10/25: 1450    Time 12    Period Weeks    Status Revised    Target Date 12/25/20      PT LONG TERM GOAL #5   Title Patient will improve UE shoulder/scapular strength to 4/5 or greater to improve shoulder stability and safety when lifting daughter or doing ADLs.    Baseline grossly 3/5    Time 6    Period Weeks    Status New    Target Date 12/25/20                   Plan - 12/11/20 1111     Clinical Impression Statement Patient presented with good motivation and reports dizziness and  back pain were much improved. He performed well - challenged with LE strengthening/balance activities yet no significant LOB and no report of dizziness or pain. Patient exhibited no difficulty with floor to stand transfers and reports he has been able to get down on floor to play with his daughter. Patient will continue to benefit from skilled PT to improve dynamic balance and strengthening to increase safety as caregiver, promote independence and return to prior level of function.    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 complexity    Rehab Potential Excellent    PT Frequency 2x / week    PT Duration 12 weeks    PT Treatment/Interventions ADLs/Self Care Home Management;Neuromuscular re-education;Patient/family education;Manual techniques;Passive range of motion;Dry needling;Energy conservation;Vestibular;Visual/perceptual remediation/compensation;Therapeutic activities;Functional mobility training;Therapeutic exercise;Balance training;Gait training;Canalith Repostioning    PT Next Visit Plan Increase scapular strengthening and dynamic balance on uneven surfaces, develop HEP.    PT Home Exercise Plan Access Code: [email or web address]; 10/31/2020=Access Code: [email or web address]             Patient will benefit from skilled therapeutic intervention in order to improve the following deficits and impairments:  Abnormal gait, Decreased activity tolerance, Decreased balance, Decreased coordination, Decreased endurance, Decreased mobility, Decreased range of motion, Decreased strength, Difficulty walking, Dizziness, Impaired perceived functional ability, Impaired  vision/preception  Visit Diagnosis: Abnormality of gait and mobility  Difficulty  in walking, not elsewhere classified  Muscle weakness (generalized)  Unsteadiness on feet     Problem List Patient Active Problem List   Diagnosis Date Noted   Thoracic back pain 11/01/2020   Somatic dysfunction of spine, thoracic 11/01/2020   Slow transit constipation    Morbid obesity ([place])    Neck pain    ASD (atrial septal defect)    Ischemic cerebrovascular accident (CVA) of frontal lobe ([place]) 09/13/2020   Stroke ([place]) 09/10/2020   Lateral medullary syndrome    Vertebral artery dissection ([place]) 09/09/2020   Obesity (BMI 35.0-39.9 without comorbidity) 09/09/2020   Essential hypertension 09/09/2020   Acute stroke of medulla oblongata ([place])     Lewis Moccasin, PT 12/11/2020, 11:16 AM  [facility] [facility] [address] [place], [place], [address] Phone: [phone]   Fax:  [phone]  Name: WEBER MONNIER MRN: [id number] Date of Birth: December 22, 1985

## 2020-12-12 ENCOUNTER — Ambulatory Visit: Payer: BC Managed Care – PPO

## 2020-12-16 ENCOUNTER — Ambulatory Visit: Payer: BC Managed Care – PPO | Admitting: Family Medicine

## 2020-12-16 DIAGNOSIS — F4322 Adjustment disorder with anxiety: Secondary | ICD-10-CM | POA: Diagnosis not present

## 2020-12-16 DIAGNOSIS — F411 Generalized anxiety disorder: Secondary | ICD-10-CM | POA: Diagnosis not present

## 2020-12-17 ENCOUNTER — Ambulatory Visit: Payer: BC Managed Care – PPO

## 2020-12-17 ENCOUNTER — Encounter: Payer: BC Managed Care – PPO | Admitting: Occupational Therapy

## 2020-12-17 ENCOUNTER — Other Ambulatory Visit: Payer: Self-pay

## 2020-12-17 DIAGNOSIS — M6281 Muscle weakness (generalized): Secondary | ICD-10-CM | POA: Diagnosis not present

## 2020-12-17 DIAGNOSIS — R278 Other lack of coordination: Secondary | ICD-10-CM | POA: Diagnosis not present

## 2020-12-17 DIAGNOSIS — I7774 Dissection of vertebral artery: Secondary | ICD-10-CM

## 2020-12-17 DIAGNOSIS — H543 Unqualified visual loss, both eyes: Secondary | ICD-10-CM | POA: Diagnosis not present

## 2020-12-17 DIAGNOSIS — R269 Unspecified abnormalities of gait and mobility: Secondary | ICD-10-CM | POA: Diagnosis not present

## 2020-12-17 DIAGNOSIS — R2681 Unsteadiness on feet: Secondary | ICD-10-CM | POA: Diagnosis not present

## 2020-12-17 DIAGNOSIS — R262 Difficulty in walking, not elsewhere classified: Secondary | ICD-10-CM | POA: Diagnosis not present

## 2020-12-17 DIAGNOSIS — R2689 Other abnormalities of gait and mobility: Secondary | ICD-10-CM | POA: Diagnosis not present

## 2020-12-17 NOTE — Therapy (Signed)
[facility] MAIN [facility] SERVICES [address] [place], [place], [address] Phone: [phone]   Fax:  [phone]  Occupational Therapy Discharge  Patient Details  Name: Curtis [name] MRN: [id number] Date of Birth: [birth year] Referring Provider (OT): Dr. Mariam Dollar   Encounter Date: 12/17/2020   OT End of Session - 12/17/20 1407     Visit Number 14    Number of Visits 24    Date for OT Re-Evaluation 12/25/20    Authorization Time Period Progress report period starting 11/19/2020    OT Start Time 0930    OT Stop Time 0955    OT Time Calculation (min) 25 min    Activity Tolerance Patient tolerated treatment well    Behavior During Therapy [facility] for tasks assessed/performed             Past Medical History:  Diagnosis Date   Stroke [facility])     No past surgical history on file.  There were no vitals filed for this visit.   Subjective Assessment - 12/17/20 1405     Subjective  Pt reports he feels ready to discharge OT today.    Patient is accompanied by: Family member    Pertinent History Pt. is a 35 y.o. male who was diagnosed with Vertebral Artery Dissection, and Small Acute CVA. Pt. has Dizziness, nausea, diplopia, and fatigue. Pt.'s 1st child was born 3 months ago. Pt. 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    Patient Stated Goals To be able to hold, and feed his daughter, and to be able to play golf again.    Currently in Pain? No/denies    Pain Score 0-No pain    Pain Onset In the past 7 days             [facility] OT Assessment - 12/18/20 0001       Assessment   Medical Diagnosis CVA    Referring Provider (OT) Dr. Mariam Dollar    Onset Date/Surgical Date 09/09/20    Hand Dominance Right    Next MD Visit Monday 12/23/2020      Vision Assessment   Tracking/Visual Pursuits Requires cues, head turns, or add eye shifts to track   minimal head turns, visual fatigue compared to baseline, but pt can now  type/copy an entire page of text using laptop, has increased tolerance for watching tv; pt intermittently closes eyes in the car, some difficulty locating family members in a crowd   Saccades Within functional limits    Diplopia Assessment Disappears with one eye closed   diplopia is far less frequent     Observation/Other Assessments   Focus on Therapeutic Outcomes (FOTO)  90      Coordination   Right 9 Hole Peg Test 18 sec    Left 9 Hole Peg Test 21 sec      Strength   Overall Strength Comments L shoulder flex 4+/5, R 5/5, bilat shoulder abd 5/5, bilat elbow flex/ext 5/5, wrist flex/ext 5/5      Hand Function   Right Hand Grip (lbs) 102    Right Hand Lateral Pinch 25 lbs    Right Hand 3 Point Pinch 27 lbs    Left Hand Grip (lbs) 106    Left Hand Lateral Pinch 27 lbs    Left 3 point pinch 27 lbs               [facility] OT Assessment - 12/18/20 0001  Assessment   Medical Diagnosis CVA    Referring Provider (OT) Dr. Mariam Dollar    Onset Date/Surgical Date 09/09/20    Hand Dominance Right    Next MD Visit Monday 12/23/2020      Vision Assessment   Tracking/Visual Pursuits Requires cues, head turns, or add eye shifts to track   minimal head turns, visual fatigue compared to baseline, but pt can now type/copy an entire page of text using laptop, has increased tolerance for watching tv; pt intermittently closes eyes in the car, some difficulty locating family members in a crowd   Saccades Within functional limits    Diplopia Assessment Disappears with one eye closed   diplopia is far less frequent     Observation/Other Assessments   Focus on Therapeutic Outcomes (FOTO)  90      Coordination   Right 9 Hole Peg Test 18 sec    Left 9 Hole Peg Test 21 sec      Strength   Overall Strength Comments L shoulder flex 4+/5, R 5/5, bilat shoulder abd 5/5, bilat elbow flex/ext 5/5, wrist flex/ext 5/5      Hand Function   Right Hand Grip (lbs) 102    Right Hand Lateral Pinch  25 lbs    Right Hand 3 Point Pinch 27 lbs    Left Hand Grip (lbs) 106    Left Hand Lateral Pinch 27 lbs    Left 3 point pinch 27 lbs             [facility] OT Assessment - 12/18/20 0001       Assessment   Medical Diagnosis CVA    Referring Provider (OT) Dr. Mariam Dollar    Onset Date/Surgical Date 09/09/20    Hand Dominance Right    Next MD Visit Monday 12/23/2020      Vision Assessment   Tracking/Visual Pursuits Requires cues, head turns, or add eye shifts to track   minimal head turns, visual fatigue compared to baseline, but pt can now type/copy an entire page of text using laptop, has increased tolerance for watching tv; pt intermittently closes eyes in the car, some difficulty locating family members in a crowd   Saccades Within functional limits    Diplopia Assessment Disappears with one eye closed   diplopia is far less frequent     Observation/Other Assessments   Focus on Therapeutic Outcomes (FOTO)  90      Coordination   Right 9 Hole Peg Test 18 sec    Left 9 Hole Peg Test 21 sec      Strength   Overall Strength Comments L shoulder flex 4+/5, R 5/5, bilat shoulder abd 5/5, bilat elbow flex/ext 5/5, wrist flex/ext 5/5      Hand Function   Right Hand Grip (lbs) 102    Right Hand Lateral Pinch 25 lbs    Right Hand 3 Point Pinch 27 lbs    Left Hand Grip (lbs) 106    Left Hand Lateral Pinch 27 lbs    Left 3 point pinch 27 lbs             [facility] OT Assessment - 12/18/20 0001       Assessment   Medical Diagnosis CVA    Referring Provider (OT) Dr. Mariam Dollar    Onset Date/Surgical Date 09/09/20    Hand Dominance Right    Next MD Visit Monday 12/23/2020      Vision Assessment   Tracking/Visual Pursuits Requires cues, head turns,  or add eye shifts to track   minimal head turns, visual fatigue compared to baseline, but pt can now type/copy an entire page of text using laptop, has increased tolerance for watching tv; pt intermittently closes eyes in the car,  some difficulty locating family members in a crowd   Saccades Within functional limits    Diplopia Assessment Disappears with one eye closed   diplopia is far less frequent     Observation/Other Assessments   Focus on Therapeutic Outcomes (FOTO)  90      Coordination   Right 9 Hole Peg Test 18 sec    Left 9 Hole Peg Test 21 sec      Strength   Overall Strength Comments L shoulder flex 4+/5, R 5/5, bilat shoulder abd 5/5, bilat elbow flex/ext 5/5, wrist flex/ext 5/5      Hand Function   Right Hand Grip (lbs) 102    Right Hand Lateral Pinch 25 lbs    Right Hand 3 Point Pinch 27 lbs    Left Hand Grip (lbs) 106    Left Hand Lateral Pinch 27 lbs    Left 3 point pinch 27 lbs             [facility] OT Assessment - 12/18/20 0001       Assessment   Medical Diagnosis CVA    Referring Provider (OT) Dr. Mariam Dollar    Onset Date/Surgical Date 09/09/20    Hand Dominance Right    Next MD Visit Monday 12/23/2020      Vision Assessment   Tracking/Visual Pursuits Requires cues, head turns, or add eye shifts to track   minimal head turns, visual fatigue compared to baseline, but pt can now type/copy an entire page of text using laptop, has increased tolerance for watching tv; pt intermittently closes eyes in the car, some difficulty locating family members in a crowd   Saccades Within functional limits    Diplopia Assessment Disappears with one eye closed   diplopia is far less frequent     Observation/Other Assessments   Focus on Therapeutic Outcomes (FOTO)  90      Coordination   Right 9 Hole Peg Test 18 sec    Left 9 Hole Peg Test 21 sec      Strength   Overall Strength Comments L shoulder flex 4+/5, R 5/5, bilat shoulder abd 5/5, bilat elbow flex/ext 5/5, wrist flex/ext 5/5      Hand Function   Right Hand Grip (lbs) 102    Right Hand Lateral Pinch 25 lbs    Right Hand 3 Point Pinch 27 lbs    Left Hand Grip (lbs) 106    Left Hand Lateral Pinch 27 lbs    Left 3 point pinch 27  lbs               [facility] OT Assessment - 12/18/20 0001       Assessment   Medical Diagnosis CVA    Referring Provider (OT) Dr. Mariam Dollar    Onset Date/Surgical Date 09/09/20    Hand Dominance Right    Next MD Visit Monday 12/23/2020      Vision Assessment   Tracking/Visual Pursuits Requires cues, head turns, or add eye shifts to track   minimal head turns, visual fatigue compared to baseline, but pt can now type/copy an entire page of text using laptop, has increased tolerance for watching tv; pt intermittently closes eyes in the car, some difficulty locating family members in a  crowd   Saccades Within functional limits    Diplopia Assessment Disappears with one eye closed   diplopia is far less frequent     Observation/Other Assessments   Focus on Therapeutic Outcomes (FOTO)  90      Coordination   Right 9 Hole Peg Test 18 sec    Left 9 Hole Peg Test 21 sec      Strength   Overall Strength Comments L shoulder flex 4+/5, R 5/5, bilat shoulder abd 5/5, bilat elbow flex/ext 5/5, wrist flex/ext 5/5      Hand Function   Right Hand Grip (lbs) 102    Right Hand Lateral Pinch 25 lbs    Right Hand 3 Point Pinch 27 lbs    Left Hand Grip (lbs) 106    Left Hand Lateral Pinch 27 lbs    Left 3 point pinch 27 lbs             Occupational Therapy Discharge: Pt has completed 14 OT visits post CVA to address LUE weakness, lack of coordination, and oculomotor deficits.  Pt now fully engages LUE into self care with good accuracy and efficiency, and strength is WNL.  Pt no longer requires use of eye patch for diplopia, and experiences diplopia less frequently.  Pt is now able to successfully track in all visual fields, is able to sustain gaze into R upper and lower quadrants for typing a page of text (previously struggled with excessive blinking, fatigue, and eye shifts back to midline), and is increasing tolerance for scanning in community settings.  Pt still has limitations with  riding in a car (pt intermittently closes eyes), and some difficulty remains with locating a family member in a crowded community setting, however, pt is indep with HEP, indep with visual compensatory strategies, and is indep with ADL/IADL tasks.  Pt in agreement with readiness for OT discharge.   Therapeutic Activity: Objective measurements taken, goals reassessed and updated.  Reviewed HEP and visual compensatory strategies with goal to work towards reducing compensatory strategies as able for strengthening ocular motor function.  Pt verbalized understanding and indep with therapeutic exercises and activities for strengthening functional ocular motor functions such as scanning/tracking and saccades, as well as compensatory strategies when diplopia is present.      OT Education - 12/17/20 1407     Education Details HEP review    Person(s) Educated Patient    Methods Explanation;Verbal cues    Comprehension Verbalized understanding;Verbal cues required              OT Short Term Goals - 12/17/20 1410       OT SHORT TERM GOAL #1   Title Pt. will improve FOTO score by 3 points for clinically relevant change during ADLs, and IADL tasks.    Baseline 10th visit: FOTO: 76, TR 75 Eval: FOTO score 63 TR score 75; 12/17/20 d/c FOTO 90    Time 12    Period Weeks    Status Achieved    Target Date 11/13/20               OT Long Term Goals - 12/17/20 1412       OT LONG TERM GOAL #1   Title Pt. will improve UE strength in order to be able to hold, and feed his baby with modified independence.    Baseline 10th visit: Pt. is able to hold, feed, and burp his daughter from a sitting posiiton.Eval: Pt. is unable to hold, and feed his  baby; 12/17/20 d/c: able to feed baby independently    Time 12    Period Weeks    Status Achieved    Target Date 12/25/20      OT LONG TERM GOAL #2   Title Pt. will perform light homemaking tasks with modified indepedence in standing    Baseline 10th: Pt.  is able to perform laundry, dishes, making the bed, cooking, cleaning.EVal: Limited standing tolerance during home management tasks; 12/17/20 d/c: able to manage all light homemaking tasks independently in standing without AD    Time 12    Period Weeks    Status Achieved    Target Date 12/25/20      OT LONG TERM GOAL #3   Title Pt. will independently demonstrate visual compensatory strategies 100% of the time during tabletop ADLs, and IADL tasks.    Baseline 10th visit: Pt. is improving with utilizing visual compensatory strategies  during tabletop tasks. Eval: Pt. has difficulty; 12/17/20 d/c: Pt is indep to utilize visual compensatory strategies for ADL and IADL tasks.    Time 12    Period Weeks    Status Achieved    Target Date 12/25/20      OT LONG TERM GOAL #4   Title Pt. will demonstrate visual compensatory strategies 100% of the time while performing IADL tasks within his extrpersonal space.    Baseline 10th visit: Pt. is improving with utilizing visual compensatory strategies when navigating through his environment. Eval: Pt. has difficulty; 12/17/20 d/c: indep to utilize visual compensatory strategies during IADL tasks wtihin extrapersonal space    Time 12    Period Weeks    Status Achieved    Target Date 12/25/20              Plan - 12/17/20 4098     Clinical Impression Statement Pt has completed 14 OT visits post CVA to address LUE weakness, lack of coordination, and oculomotor deficits.  Pt now fully engages LUE into self care with good accuracy and efficiency, and strength is WNL.  Pt no longer requires use of eye patch for diplopia, and experiences diplopia less frequently.  Pt is now able to successfully track in all visual fields, is able to sustain gaze into R upper and lower quadrants for typing a page of text (previously struggled with excessive blinking, fatigue, and eye shifts back to midline), and is increasing tolerance for scanning in community settings.  Pt  still has limitations with riding in a car (pt intermittently closes eyes), and some difficulty remains with locating a family member in a crowded community setting, however, pt is indep with HEP, indep with visual compensatory strategies, and is indep with ADL/IADL tasks.  Pt in agreement with readiness for OT discharge.    OT Occupational Profile and History Detailed Assessment- Review of Records and additional review of physical, cognitive, psychosocial history related to current functional performance    Occupational performance deficits (Please refer to evaluation for details): IADL's;ADL's    Body Structure / Function / Physical Skills ADL;IADL    Rehab Potential Good    Clinical Decision Making Several treatment options, min-mod task modification necessary    Comorbidities Affecting Occupational Performance: May have comorbidities impacting occupational performance    Modification or Assistance to Complete Evaluation  Min-Moderate modification of tasks or assist with assess necessary to complete eval    OT Frequency 3x / week    OT Duration 12 weeks    OT Treatment/Interventions Therapeutic exercise;DME and/or AE instruction;Patient/family  education;Therapeutic activities;Self-care/ADL training    Consulted and Agree with Plan of Care Patient             Patient will benefit from skilled therapeutic intervention in order to improve the following deficits and impairments:   Body Structure / Function / Physical Skills: ADL, IADL       Visit Diagnosis: Muscle weakness (generalized)  Other lack of coordination  Vertebral artery dissection [facility])    Problem List Patient Active Problem List   Diagnosis Date Noted   Thoracic back pain 11/01/2020   Somatic dysfunction of spine, thoracic 11/01/2020   Slow transit constipation    Morbid obesity (HCC)    Neck pain    ASD (atrial septal defect)    Ischemic cerebrovascular accident (CVA) of frontal lobe (HCC) 09/13/2020   Stroke  (HCC) 09/10/2020   Lateral medullary syndrome    Vertebral artery dissection (HCC) 09/09/2020   Obesity (BMI 35.0-39.9 without comorbidity) 09/09/2020   Essential hypertension 09/09/2020   Acute stroke of medulla oblongata (HCC)    Danelle Earthly, MS, OTR/L  [employer], OT/L 12/18/2020, 8:26 AM  [facility] MAIN [facility] SERVICES [address] [place], [place], [address] Phone: [phone]   Fax:  [phone]  Name: Curtis [name] MRN: [id number] Date of Birth: 10/27/1985

## 2020-12-17 NOTE — Therapy (Signed)
[place] [facility] SERVICES [address] [place], [place], [address] Phone: [phone]   Fax:  [phone]  Physical Therapy Treatment  Patient Details  Name: Curtis [name] MRN: [id number] Date of Birth: [birth year] Referring Provider (PT): Dawayne Patricia   Encounter Date: 12/17/2020   PT End of Session - 12/17/20 0917     Visit Number 17    Number of Visits 24    Date for PT Re-Evaluation 12/25/20    Authorization Type [employer], no auth req    Authorization Time Period Cert 04/02/44-65/99/35    Progress Note Due on Visit 10    PT Start Time 0849    PT Stop Time 0930    PT Time Calculation (min) 41 min    Equipment Utilized During Treatment Gait belt    Activity Tolerance Patient tolerated treatment well    Behavior During Therapy [facility] for tasks assessed/performed             Past Medical History:  Diagnosis Date   Stroke [facility])     History reviewed. No pertinent surgical history.  There were no vitals filed for this visit.   Subjective Assessment - 12/17/20 0850     Subjective Pt reports his dog tripped him yesterday when he was walking down his stairs. He reports he had a "slow fall." Pt reports he has been more dizzy lately and thinks it is because he has missed taking Meclizine.    Patient is accompained by: Family member   [name]   Pertinent History Curtis [name] is a 35 y.o. M with obesity no other significant PMHx presented to hospital  with acute onset nausea, dizziness. Patient had recently seen a chiropractor and being treated with cervical manipulation.  On the morning of admission he developed sudden dizziness, nausea, neck pain, and headache and so he came to the ER.    CT angiogram was obtained that showed findings consistent with dissection of the left vertebral artery.  MRI brain showed small ischemic infarct in the left lateral medulla. Pt had stay at inpatient rehab and has made significant progress in his  funciton since discharge. Pt also has 3 m.o. daughter he would like to be able to have a larger part in caring for but is restricted due to his impairments. at [facility]E. patient presented to therapy ambulating with rollator.    Limitations Lifting;Walking;Standing    How long can you sit comfortably? no issues with sitting    How long can you stand comfortably? 5-10 min    How long can you walk comfortably? 30 min (at grocery store)    Patient Stated Goals holding daughter, being able to return to golf    Currently in Pain? No/denies    Pain Onset In the past 7 days              Interventions: CGA provided throughout unless otherwise noted    Standing on airex pads (one in front of the other) and trunk twist-  2x 20 reps each direction- Patient experienced increased ankle instability (right more unstable than left yet no LOB)  Standing on airex WBOS, EC 30 sec, rates easy Standing on ariex NBOS, EC; 2x30 sec requires UE support to maintain balance Standing, semi-tandem, EC 2x30 sec each LE; intermittent UE support; pt rates more challenging with the R -Horizontal head turn with EO 30 sec  - Unsteady upon stopping, requiring UE support. -Vertical head turn  with EO, NBOS 30 sec -  pt reports feels similar to last time performed.   Wall squats x 30 sec, x 25 sec, x 22 sec (at 90 deg hip/knee or below) - reports fatigue   lunge squat with trunk twist-  Left to right and alternating BLE 8x; pt rates as fatiguing, exhibits some decreased postural stability     Kore balance machine: to promote ankle strategies, hip strategies, increased limits of stability with dual task of aiming for targets.  Tux Racer; Pt starts with BUE support and decreases to intermittent UE support SDL ball with intermittent UE support Maze performed with intermittent UE support; exhibits increased use of hip strategy.Cuing to keep feet on platform, exhibits use of step strategy. Pt reports "a touch" of tightness felt in  gastrocs when attempted to keep foot contact.   Standing gastrocsol stretch 30 sec each LE   Education provided throughout session via VC/TC and demonstration to facilitate movement at target joints and correct muscle activation for all testing and exercises performed.    Clinical Impression: Pt exhibits excellent motivation to participate in PT. The pt was able to progress to performing Kore balance interventions with intermittent UE support, but not yet able to maintain balance without UE support. Pt did report feelings of continued motion with rotational interventions on compliant surface this session, and reports he has missed a few doses of his Meclizine.The patient will continue to benefit from skilled PT to improve dynamic balance and strengthening to increase safety as caregiver, promote independence and return to prior level of function.           PT Education - 12/17/20 0916     Education Details exercise technique, body mechanics    Person(s) Educated Patient    Methods Explanation;Demonstration;Verbal cues    Comprehension Verbalized understanding;Returned demonstration;Need further instruction              PT Short Term Goals - 11/19/20 1359       PT SHORT TERM GOAL #1   Title Patient will be independent in home exercise program to improve strength/mobility for better functional independence with ADLs.    Baseline Pt has HEP from hospital discharge but it is not progressive, 10/25: doing exercises every other day    Time 4    Period Weeks    Status Achieved    Target Date 10/30/20      PT SHORT TERM GOAL #2   Title Patient (< 74 years old) will complete five times sit to stand test in < 15 seconds indicating an increased LE strength and improved balance.    Baseline >20 sec, 11.6 seconds    Time 4    Period Weeks    Status Achieved    Target Date 10/30/20      PT SHORT TERM GOAL #3   Title Patient will increase six minute walk test distance to >1200 with  LRAD for progression to community ambulator and improve gait ability    Baseline 1015 feet with rollator, 10/25: 1450 feet without AD    Time 4    Period Weeks    Status Achieved    Target Date 10/30/20               PT Long Term Goals - 11/19/20 1410       PT LONG TERM GOAL #1   Title Patient will increase FOTO score to equal to or greater than 15    to demonstrate statistically significant improvement in mobility and quality of life.  Baseline 63.5, 10/25: 61%    Time 12    Period Weeks    Status On-going    Target Date 12/25/20      PT LONG TERM GOAL #2   Title Patient will increase Functional Gait Assessment score to >25/30 as to reduce fall risk and improve dynamic gait safety with community ambulation.    Baseline 20, 10/25: 23/30    Time 12    Period Weeks    Status Partially Met    Target Date 12/25/20      PT LONG TERM GOAL #3   Title Patient will ascend/descend 4 stairs without rail assist independently without loss of balance to improve ability to get in/out of home.    Baseline requires use of handrails to safely naigate stairs, 10/25: requires 1 rail assist when descending;    Time 12    Period Weeks    Status Partially Met    Target Date 12/25/20      PT LONG TERM GOAL #4   Title Patient will increase six minute walk test distance to >1800 feet without AD for progression to community ambulator and improve gait ability    Baseline 1015 with rollator, 10/25: 1450    Time 12    Period Weeks    Status Revised    Target Date 12/25/20      PT LONG TERM GOAL #5   Title Patient will improve UE shoulder/scapular strength to 4/5 or greater to improve shoulder stability and safety when lifting daughter or doing ADLs.    Baseline grossly 3/5    Time 6    Period Weeks    Status New    Target Date 12/25/20                   Plan - 12/17/20 1610     Clinical Impression Statement Pt exhibits excellent motivation to participate in PT. The pt was  able to progress to performing Kore balance interventions with intermittent UE support, but not yet able to maintain balance without UE support. Pt did report feelings of continued motion with rotational interventions on compliant surface this session, and reports he has missed a few doses of his Meclizine.The patient will continue to benefit from skilled PT to improve dynamic balance and strengthening to increase safety as caregiver, promote independence and return to prior level of function.    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 complexity    Rehab Potential Excellent    PT Frequency 2x / week    PT Duration 12 weeks    PT Treatment/Interventions ADLs/Self Care Home Management;Neuromuscular re-education;Patient/family education;Manual techniques;Passive range of motion;Dry needling;Energy conservation;Vestibular;Visual/perceptual remediation/compensation;Therapeutic activities;Functional mobility training;Therapeutic exercise;Balance training;Gait training;Canalith Repostioning    PT Next Visit Plan Increase scapular strengthening and dynamic balance on uneven surfaces, develop HEP. Continue POC of previously indicated    PT Home Exercise Plan Access Code: [email or web address]; 10/31/2020=Access Code: [email or web address]; no updates    Consulted and Agree with Plan of Care Patient             Patient will benefit from skilled therapeutic intervention in order to improve the following deficits and impairments:  Abnormal gait, Decreased activity tolerance, Decreased balance, Decreased coordination, Decreased endurance, Decreased mobility, Decreased range of motion, Decreased strength, Difficulty walking, Dizziness, Impaired perceived  functional ability, Impaired  vision/preception  Visit Diagnosis: Unsteadiness on feet  Muscle weakness (generalized)     Problem List Patient Active Problem List   Diagnosis Date Noted   Thoracic back pain 11/01/2020   Somatic dysfunction of spine, thoracic 11/01/2020   Slow transit constipation    Morbid obesity (HCC)    Neck pain    ASD (atrial septal defect)    Ischemic cerebrovascular accident (CVA) of frontal lobe ([place]) 09/13/2020   Stroke ([place]) 09/10/2020   Lateral medullary syndrome    Vertebral artery dissection ([place]) 09/09/2020   Obesity (BMI 35.0-39.9 without comorbidity) 09/09/2020   Essential hypertension 09/09/2020   Acute stroke of medulla oblongata ([place])     Zollie Pee, PT 12/17/2020, 9:46 AM  [place] [facility] SERVICES [address] [place], [place], [address] Phone: [phone]   Fax:  [phone]  Name: Curtis [name] MRN: [id number] Date of Birth: [birth year]

## 2020-12-22 NOTE — Progress Notes (Deleted)
There were no vitals taken for this visit.   Subjective:    Patient ID: Curtis [name], male    DOB: [birth year], 35 y.o.   MRN: [id number]  HPI: Curtis [name] is a 35 y.o. male  No chief complaint on file.  Presented to [facility] 09/09/2020 left-sided numbness headache dizziness as well as nausea x1 week.  Patient had been seeing the MRI of the brain showed a small acute stroke of the lateral left medulla.  CTA head neck irregularity of the V2 segment of the left vertebral artery with sites of up to mild to moderate stenosis.  Irregularity of the V4 left vertebral artery with sites of up to moderate/severe stenosis.  MRI of the brain follow-up more conspicuous signal abnormality in the left medulla that could possibly evolutionary change of previous stroke.  Neurology follow-up maintained on aspirin and Plavix x3 months.  Patient would require repeat vascular imaging in 3 months to determine need to continue DAPT.   Patient has been doing PT and OT to 2x weekly.  Yesterday they cut his OT back to 1x weekly.  Patient states the weakness has improved significantly since doing OT.  Patient states it feels like their is a layer of something over his skin.  He can't completely feel things. Patient states he is still having dizziness.  He is taking Meclizine 50mg  TID.  Feels like it is related his vision.  Does have an ophthalmology appointment today.       Active Ambulatory Problems    Diagnosis Date Noted   Vertebral artery dissection (HCC) 09/09/2020   Obesity (BMI 35.0-39.9 without comorbidity) 09/09/2020   Essential hypertension 09/09/2020   Acute stroke of medulla oblongata (HCC)    Stroke (HCC) 09/10/2020   Lateral medullary syndrome    Ischemic cerebrovascular accident (CVA) of frontal lobe (HCC) 09/13/2020   ASD (atrial septal defect)    Slow transit constipation    Morbid obesity (HCC)    Neck pain    Thoracic back pain 11/01/2020   Somatic dysfunction of spine, thoracic  11/01/2020   Resolved Ambulatory Problems    Diagnosis Date Noted   No Resolved Ambulatory Problems   No Additional Past Medical History   No past surgical history on file.  No family history on file.   Review of Systems  Neurological:  Positive for dizziness.       Change in sensation   Per HPI unless specifically indicated above     Objective:    There were no vitals taken for this visit.  Wt Readings from Last 3 Encounters:  11/14/20 (!) 311 lb 9.6 oz (141.3 kg)  11/01/20 (!) 307 lb (139.3 kg)  10/17/20 295 lb 2 oz (133.9 kg)    Physical Exam Vitals and nursing note reviewed.  Constitutional:      General: He is not in acute distress.    Appearance: Normal appearance. He is not ill-appearing, toxic-appearing or diaphoretic.  HENT:     Head: Normocephalic.     Right Ear: External ear normal.     Left Ear: External ear normal.     Nose: Nose normal. No congestion or rhinorrhea.     Mouth/Throat:     Mouth: Mucous membranes are moist.  Eyes:     General:        Right eye: No discharge.        Left eye: No discharge.     Extraocular Movements: Extraocular movements intact.  Conjunctiva/sclera: Conjunctivae normal.     Pupils: Pupils are equal, round, and reactive to light.  Cardiovascular:     Rate and Rhythm: Normal rate and regular rhythm.     Heart sounds: No murmur heard. Pulmonary:     Effort: Pulmonary effort is normal. No respiratory distress.     Breath sounds: Normal breath sounds. No wheezing, rhonchi or rales.  Abdominal:     General: Abdomen is flat. Bowel sounds are normal.  Musculoskeletal:     Cervical back: Normal range of motion and neck supple.  Skin:    General: Skin is warm and dry.     Capillary Refill: Capillary refill takes less than 2 seconds.  Neurological:     General: No focal deficit present.     Mental Status: He is alert and oriented to person, place, and time.  Psychiatric:        Mood and Affect: Mood normal.         Behavior: Behavior normal.        Thought Content: Thought content normal.        Judgment: Judgment normal.    Results for orders placed or performed during the hospital encounter of 11/04/20  Lipase, blood  Result Value Ref Range   Lipase 41 11 - 51 U/L  Comprehensive metabolic panel  Result Value Ref Range   Sodium 138 135 - 145 mmol/L   Potassium 4.2 3.5 - 5.1 mmol/L   Chloride 103 98 - 111 mmol/L   CO2 27 22 - 32 mmol/L   Glucose, Bld 103 (H) 70 - 99 mg/dL   BUN 16 6 - 20 mg/dL   Creatinine, Ser 7.41 0.61 - 1.24 mg/dL   Calcium 9.4 8.9 - 28.7 mg/dL   Total Protein 7.1 6.5 - 8.1 g/dL   Albumin 4.0 3.5 - 5.0 g/dL   AST 47 (H) 15 - 41 U/L   ALT 64 (H) 0 - 44 U/L   Alkaline Phosphatase 75 38 - 126 U/L   Total Bilirubin 0.9 0.3 - 1.2 mg/dL   GFR, Estimated >86 >76 mL/min   Anion gap 8 5 - 15  CBC  Result Value Ref Range   WBC 10.8 (H) 4.0 - 10.5 K/uL   RBC 5.31 4.22 - 5.81 MIL/uL   Hemoglobin 15.7 13.0 - 17.0 g/dL   HCT 72.0 94.7 - 09.6 %   MCV 85.9 80.0 - 100.0 fL   MCH 29.6 26.0 - 34.0 pg   MCHC 34.4 30.0 - 36.0 g/dL   RDW 28.3 66.2 - 94.7 %   Platelets 252 150 - 400 K/uL   nRBC 0.0 0.0 - 0.2 %      Assessment & Plan:   Problem List Items Addressed This Visit   None    Follow up plan: No follow-ups on file.   A total of 40 minutes were spent on this encounter today.  When total time is documented, this includes both the face-to-face and non-face-to-face time personally spent before, during and after the visit on the date of the encounter reviewing hospitalization, neurology notes, imaging, and discussing plan of care with patient.

## 2020-12-23 ENCOUNTER — Ambulatory Visit: Payer: BC Managed Care – PPO | Admitting: Nurse Practitioner

## 2020-12-23 ENCOUNTER — Encounter: Payer: Self-pay | Admitting: Nurse Practitioner

## 2020-12-23 ENCOUNTER — Other Ambulatory Visit: Payer: Self-pay

## 2020-12-23 VITALS — BP 131/94 | HR 81 | Temp 98.5°F | Ht 71.9 in | Wt 315.6 lb

## 2020-12-23 DIAGNOSIS — Z1159 Encounter for screening for other viral diseases: Secondary | ICD-10-CM | POA: Diagnosis not present

## 2020-12-23 DIAGNOSIS — I7774 Dissection of vertebral artery: Secondary | ICD-10-CM

## 2020-12-23 DIAGNOSIS — H538 Other visual disturbances: Secondary | ICD-10-CM | POA: Diagnosis not present

## 2020-12-23 DIAGNOSIS — Z23 Encounter for immunization: Secondary | ICD-10-CM

## 2020-12-23 DIAGNOSIS — Z0000 Encounter for general adult medical examination without abnormal findings: Secondary | ICD-10-CM | POA: Diagnosis not present

## 2020-12-23 DIAGNOSIS — H532 Diplopia: Secondary | ICD-10-CM | POA: Diagnosis not present

## 2020-12-23 DIAGNOSIS — H518 Other specified disorders of binocular movement: Secondary | ICD-10-CM | POA: Diagnosis not present

## 2020-12-23 DIAGNOSIS — I639 Cerebral infarction, unspecified: Secondary | ICD-10-CM | POA: Diagnosis not present

## 2020-12-23 DIAGNOSIS — Z136 Encounter for screening for cardiovascular disorders: Secondary | ICD-10-CM | POA: Diagnosis not present

## 2020-12-23 DIAGNOSIS — I6389 Other cerebral infarction: Secondary | ICD-10-CM

## 2020-12-23 DIAGNOSIS — I10 Essential (primary) hypertension: Secondary | ICD-10-CM

## 2020-12-23 NOTE — Progress Notes (Signed)
BP (!) 134/93   Pulse 78   Temp 98.5 F (36.9 C) (Oral)   Ht 5' 11.9" (1.826 m)   Wt (!) 315 lb 9.6 oz (143.2 kg)   SpO2 98%   BMI 42.92 kg/m    Subjective:    Patient ID: Curtis [name], male    DOB: April 29, 1985, 35 y.o.   MRN: [id number]  HPI: Curtis [name] is a 35 y.o. male presenting on 12/23/2020 for comprehensive medical examination. Current medical complaints include:none  He currently lives with: Interim Problems from his last visit: no  Presented to [facility] 09/09/2020 left-sided numbness headache dizziness as well as nausea x1 week.  Patient had been seeing the MRI of the brain showed a small acute stroke of the lateral left medulla.  CTA head neck irregularity of the V2 segment of the left vertebral artery with sites of up to mild to moderate stenosis.  Irregularity of the V4 left vertebral artery with sites of up to moderate/severe stenosis.  MRI of the brain follow-up more conspicuous signal abnormality in the left medulla that could possibly evolutionary change of previous stroke.  Neurology follow-up maintained on aspirin and Plavix x3 months.  Patient would require repeat vascular imaging in 3 months to determine need to continue DAPT.   Patient has been doing PT and OT to 2x weekly.  Yesterday they cut his OT back to 1x weekly.  Patient states the weakness has improved significantly since doing OT.  Patient states it feels like their is a layer of something over his skin.  He can't completely feel things. Patient states he is still having dizziness.  He is taking Meclizine 50mg  TID.  He does have some vision issues.  He is following up with opthalmology today.    Patient states he is getting better everyday.  He has stopped taking the Gabapentin.   He does have some numbness and tingling in the right arm.    Depression Screen done today and results listed below:  Depression screen [facility] 2/9 12/23/2020 11/14/2020 10/17/2020  Decreased Interest 0 0 0  Down, Depressed,  Hopeless 0 0 0  PHQ - 2 Score 0 0 0  Altered sleeping 0 1 -  Tired, decreased energy 1 1 -  Change in appetite 1 0 -  Feeling bad or failure about yourself  0 0 -  Trouble concentrating 1 1 -  Moving slowly or fidgety/restless 0 0 -  Suicidal thoughts 0 0 -  PHQ-9 Score 3 3 -  Difficult doing work/chores Not difficult at all Somewhat difficult -    The patient has a history of falls. I did complete a risk assessment for falls. A plan of care for falls was documented.   Past Medical History:  Past Medical History:  Diagnosis Date   Stroke [facility])     Surgical History:  History reviewed. No pertinent surgical history.  Medications:  Current Outpatient Medications on File Prior to Visit  Medication Sig   cholecalciferol (VITAMIN D3) 25 MCG (1000 UNIT) tablet Take 1,000 Units by mouth daily.   FLUoxetine (PROZAC) 40 MG capsule Take 1 capsule (40 mg total) by mouth daily.   meclizine (ANTIVERT) 50 MG tablet Take 1 tablet (50 mg total) by mouth 3 (three) times daily.   vitamin B-12 (CYANOCOBALAMIN) 1000 MCG tablet Take 1,000 mcg by mouth daily.   [DISCONTINUED] atorvastatin (LIPITOR) 40 MG tablet Take 1 tablet (40 mg total) by mouth daily.   No current facility-administered medications on  file prior to visit.    Allergies:  No Known Allergies  Social History:  Social History   Socioeconomic History   Marital status: Married    Spouse name: Not on file   Number of children: Not on file   Years of education: Not on file   Highest education level: Not on file  Occupational History   Not on file  Tobacco Use   Smoking status: Former    Types: Cigars   Smokeless tobacco: Never  Vaping Use   Vaping Use: Never used  Substance and Sexual Activity   Alcohol use: Yes    Alcohol/week: 2.0 standard drinks    Types: 2 Shots of liquor per week   Drug use: Never   Sexual activity: Yes  Other Topics Concern   Not on file  Social History Narrative   Not on file   Social  Determinants of Health   Financial Resource Strain: Not on file  Food Insecurity: Not on file  Transportation Needs: Not on file  Physical Activity: Not on file  Stress: Not on file  Social Connections: Not on file  Intimate Partner Violence: Not on file   Social History   Tobacco Use  Smoking Status Former   Types: Cigars  Smokeless Tobacco Never   Social History   Substance and Sexual Activity  Alcohol Use Yes   Alcohol/week: 2.0 standard drinks   Types: 2 Shots of liquor per week    Family History:  Family History  Problem Relation Age of Onset   COPD Maternal Grandmother    Arthritis Maternal Grandmother    Alcohol abuse Maternal Grandfather    Heart attack Maternal Grandfather     Past medical history, surgical history, medications, allergies, family history and social history reviewed with patient today and changes made to appropriate areas of the chart.   Review of Systems  Eyes:  Negative for blurred vision and double vision.  Respiratory:  Negative for shortness of breath.   Cardiovascular:  Negative for chest pain, palpitations and leg swelling.  Neurological:  Positive for tingling. Negative for dizziness and headaches.  All other ROS negative except what is listed above and in the HPI.      Objective:    BP (!) 134/93   Pulse 78   Temp 98.5 F (36.9 C) (Oral)   Ht 5' 11.9" (1.826 m)   Wt (!) 315 lb 9.6 oz (143.2 kg)   SpO2 98%   BMI 42.92 kg/m   Wt Readings from Last 3 Encounters:  12/23/20 (!) 315 lb 9.6 oz (143.2 kg)  11/14/20 (!) 311 lb 9.6 oz (141.3 kg)  11/01/20 (!) 307 lb (139.3 kg)    Physical Exam Vitals and nursing note reviewed.  Constitutional:      General: He is not in acute distress.    Appearance: Normal appearance. He is not ill-appearing, toxic-appearing or diaphoretic.  HENT:     Head: Normocephalic.     Right Ear: Tympanic membrane, ear canal and external ear normal.     Left Ear: Tympanic membrane, ear canal and  external ear normal.     Nose: Nose normal. No congestion or rhinorrhea.     Mouth/Throat:     Mouth: Mucous membranes are moist.  Eyes:     General:        Right eye: No discharge.        Left eye: No discharge.     Extraocular Movements: Extraocular movements intact.  Conjunctiva/sclera: Conjunctivae normal.     Pupils: Pupils are equal, round, and reactive to light.  Cardiovascular:     Rate and Rhythm: Normal rate and regular rhythm.     Heart sounds: No murmur heard. Pulmonary:     Effort: Pulmonary effort is normal. No respiratory distress.     Breath sounds: Normal breath sounds. No wheezing, rhonchi or rales.  Abdominal:     General: Abdomen is flat. Bowel sounds are normal. There is no distension.     Palpations: Abdomen is soft.     Tenderness: There is no abdominal tenderness. There is no guarding.  Musculoskeletal:     Cervical back: Normal range of motion and neck supple.  Skin:    General: Skin is warm and dry.     Capillary Refill: Capillary refill takes less than 2 seconds.  Neurological:     General: No focal deficit present.     Mental Status: He is alert and oriented to person, place, and time.     Cranial Nerves: No cranial nerve deficit.     Motor: No weakness.     Deep Tendon Reflexes: Reflexes normal.  Psychiatric:        Mood and Affect: Mood normal.        Behavior: Behavior normal.        Thought Content: Thought content normal.        Judgment: Judgment normal.    Results for orders placed or performed during the hospital encounter of 11/04/20  Lipase, blood  Result Value Ref Range   Lipase 41 11 - 51 U/L  Comprehensive metabolic panel  Result Value Ref Range   Sodium 138 135 - 145 mmol/L   Potassium 4.2 3.5 - 5.1 mmol/L   Chloride 103 98 - 111 mmol/L   CO2 27 22 - 32 mmol/L   Glucose, Bld 103 (H) 70 - 99 mg/dL   BUN 16 6 - 20 mg/dL   Creatinine, Ser 0.04 0.61 - 1.24 mg/dL   Calcium 9.4 8.9 - 59.9 mg/dL   Total Protein 7.1 6.5 - 8.1  g/dL   Albumin 4.0 3.5 - 5.0 g/dL   AST 47 (H) 15 - 41 U/L   ALT 64 (H) 0 - 44 U/L   Alkaline Phosphatase 75 38 - 126 U/L   Total Bilirubin 0.9 0.3 - 1.2 mg/dL   GFR, Estimated >77 >41 mL/min   Anion gap 8 5 - 15  CBC  Result Value Ref Range   WBC 10.8 (H) 4.0 - 10.5 K/uL   RBC 5.31 4.22 - 5.81 MIL/uL   Hemoglobin 15.7 13.0 - 17.0 g/dL   HCT 42.3 95.3 - 20.2 %   MCV 85.9 80.0 - 100.0 fL   MCH 29.6 26.0 - 34.0 pg   MCHC 34.4 30.0 - 36.0 g/dL   RDW 33.4 35.6 - 86.1 %   Platelets 252 150 - 400 K/uL   nRBC 0.0 0.0 - 0.2 %      Assessment & Plan:   Problem List Items Addressed This Visit       Cardiovascular and Mediastinum   Vertebral artery dissection [facility])    Following up with Neurology and Ophthalmology. Improving daily. Continue to follow per the specialists recommendations.       Essential hypertension    Chronic. Well controlled.  Elevated on first check but improved on second. Due to history of stroke may benefit from a low dose blood pressure medication. If elevated at next visit will  discuss at that time. Recommend weight loss and DASH diet to aid in blood pressure control.       Stroke [facility])    Following up with Neurology and Ophthalmology. Improving daily. Continue to follow per the specialists recommendations.       Relevant Orders   Lipid panel     Other   Morbid obesity (HCC)    Patient will be having gallbladder out in December. Recommend low fat diet and beginning to walk. Recommend smaller meals that prioritize protein.      Other Visit Diagnoses     Annual physical exam    -  Primary   Health maintenance reviewed during visit today. Labs ordered. Flu shot given.    Relevant Orders   TSH   CBC with Differential/Platelet   Comprehensive metabolic panel   Urinalysis, Routine w reflex microscopic   Encounter for hepatitis C screening test for low risk patient       Relevant Orders   Hepatitis C Antibody   Need for influenza vaccination        Relevant Orders   Flu Vaccine QUAD 80mo+IM (Fluarix, Fluzone & Alfiuria Quad PF) (Completed)   Encounter for screening for cardiovascular disorders       Relevant Orders   Lipid panel        Discussed aspirin prophylaxis for myocardial infarction prevention and decision was it was not indicated  LABORATORY TESTING:  Health maintenance labs ordered today as discussed above.    IMMUNIZATIONS:   - Tdap: Tetanus vaccination status reviewed: last tetanus booster within 10 years. - Influenza: Administered today - Pneumovax: Not applicable - Prevnar: Not applicable - COVID: Refused - HPV:  Monogamous relationship - Shingrix vaccine: Not applicable  SCREENING: - Colonoscopy: Not applicable  Discussed with patient purpose of the colonoscopy is to detect colon cancer at curable precancerous or early stages   - AAA Screening: Not applicable  -Hearing Test: Not applicable  -Spirometry: Not applicable   PATIENT COUNSELING:    Sexuality: Discussed sexually transmitted diseases, partner selection, use of condoms, avoidance of unintended pregnancy  and contraceptive alternatives.   Advised to avoid cigarette smoking.  I discussed with the patient that most people either abstain from alcohol or drink within safe limits (<=14/week and <=4 drinks/occasion for males, <=7/weeks and <= 3 drinks/occasion for females) and that the risk for alcohol disorders and other health effects rises proportionally with the number of drinks per week and how often a drinker exceeds daily limits.  Discussed cessation/primary prevention of drug use and availability of treatment for abuse.   Diet: Encouraged to adjust caloric intake to maintain  or achieve ideal body weight, to reduce intake of dietary saturated fat and total fat, to limit sodium intake by avoiding high sodium foods and not adding table salt, and to maintain adequate dietary potassium and calcium preferably from fresh fruits, vegetables, and low-fat  dairy products.    stressed the importance of regular exercise  Injury prevention: Discussed safety belts, safety helmets, smoke detector, smoking near bedding or upholstery.   Dental health: Discussed importance of regular tooth brushing, flossing, and dental visits.   Follow up plan: NEXT PREVENTATIVE PHYSICAL DUE IN 1 YEAR. Return in about 3 months (around 03/25/2021) for Follow up after stroke.

## 2020-12-23 NOTE — Assessment & Plan Note (Signed)
Following up with Neurology and Ophthalmology. Improving daily. Continue to follow per the specialists recommendations.

## 2020-12-23 NOTE — Assessment & Plan Note (Signed)
Chronic. Well controlled.  Elevated on first check but improved on second. Due to history of stroke may benefit from a low dose blood pressure medication. If elevated at next visit will discuss at that time. Recommend weight loss and DASH diet to aid in blood pressure control.

## 2020-12-23 NOTE — Assessment & Plan Note (Signed)
Patient will be having gallbladder out in December. Recommend low fat diet and beginning to walk. Recommend smaller meals that prioritize protein.

## 2020-12-24 ENCOUNTER — Ambulatory Visit: Payer: BC Managed Care – PPO

## 2020-12-24 DIAGNOSIS — R2689 Other abnormalities of gait and mobility: Secondary | ICD-10-CM | POA: Diagnosis not present

## 2020-12-24 DIAGNOSIS — M6281 Muscle weakness (generalized): Secondary | ICD-10-CM

## 2020-12-24 DIAGNOSIS — R269 Unspecified abnormalities of gait and mobility: Secondary | ICD-10-CM

## 2020-12-24 DIAGNOSIS — R262 Difficulty in walking, not elsewhere classified: Secondary | ICD-10-CM | POA: Diagnosis not present

## 2020-12-24 DIAGNOSIS — R2681 Unsteadiness on feet: Secondary | ICD-10-CM

## 2020-12-24 DIAGNOSIS — H543 Unqualified visual loss, both eyes: Secondary | ICD-10-CM | POA: Diagnosis not present

## 2020-12-24 DIAGNOSIS — I7774 Dissection of vertebral artery: Secondary | ICD-10-CM | POA: Diagnosis not present

## 2020-12-24 DIAGNOSIS — R278 Other lack of coordination: Secondary | ICD-10-CM | POA: Diagnosis not present

## 2020-12-24 LAB — CBC WITH DIFFERENTIAL/PLATELET
Basophils Absolute: 0 10*3/uL (ref 0.0–0.2)
Basos: 1 %
EOS (ABSOLUTE): 0.6 10*3/uL — ABNORMAL HIGH (ref 0.0–0.4)
Eos: 7 %
Hematocrit: 48.4 % (ref 37.5–51.0)
Hemoglobin: 16.7 g/dL (ref 13.0–17.7)
Immature Grans (Abs): 0 10*3/uL (ref 0.0–0.1)
Immature Granulocytes: 0 %
Lymphocytes Absolute: 2.7 10*3/uL (ref 0.7–3.1)
Lymphs: 31 %
MCH: 30.1 pg (ref 26.6–33.0)
MCHC: 34.5 g/dL (ref 31.5–35.7)
MCV: 87 fL (ref 79–97)
Monocytes Absolute: 1.3 10*3/uL — ABNORMAL HIGH (ref 0.1–0.9)
Monocytes: 15 %
Neutrophils Absolute: 4 10*3/uL (ref 1.4–7.0)
Neutrophils: 46 %
Platelets: 246 10*3/uL (ref 150–450)
RBC: 5.55 x10E6/uL (ref 4.14–5.80)
RDW: 12.4 % (ref 11.6–15.4)
WBC: 8.7 10*3/uL (ref 3.4–10.8)

## 2020-12-24 LAB — COMPREHENSIVE METABOLIC PANEL
ALT: 28 IU/L (ref 0–44)
AST: 25 IU/L (ref 0–40)
Albumin/Globulin Ratio: 2.2 (ref 1.2–2.2)
Albumin: 4.7 g/dL (ref 4.0–5.0)
Alkaline Phosphatase: 101 IU/L (ref 44–121)
BUN/Creatinine Ratio: 14 (ref 9–20)
BUN: 11 mg/dL (ref 6–20)
Bilirubin Total: 0.4 mg/dL (ref 0.0–1.2)
CO2: 24 mmol/L (ref 20–29)
Calcium: 9.6 mg/dL (ref 8.7–10.2)
Chloride: 103 mmol/L (ref 96–106)
Creatinine, Ser: 0.8 mg/dL (ref 0.76–1.27)
Globulin, Total: 2.1 g/dL (ref 1.5–4.5)
Glucose: 83 mg/dL (ref 70–99)
Potassium: 4.6 mmol/L (ref 3.5–5.2)
Sodium: 139 mmol/L (ref 134–144)
Total Protein: 6.8 g/dL (ref 6.0–8.5)
eGFR: 120 mL/min/{1.73_m2} (ref >59)

## 2020-12-24 LAB — URINALYSIS, ROUTINE W REFLEX MICROSCOPIC
Bilirubin, UA: NEGATIVE
Glucose, UA: NEGATIVE
Ketones, UA: NEGATIVE
Leukocytes,UA: NEGATIVE
Nitrite, UA: NEGATIVE
Protein,UA: NEGATIVE
RBC, UA: NEGATIVE
Specific Gravity, UA: 1.015 (ref 1.005–1.030)
Urobilinogen, Ur: 1 mg/dL (ref 0.2–1.0)
pH, UA: 7.5 (ref 5.0–7.5)

## 2020-12-24 LAB — LIPID PANEL
Chol/HDL Ratio: 3.4 ratio (ref 0.0–5.0)
Cholesterol, Total: 172 mg/dL (ref 100–199)
HDL: 51 mg/dL (ref >39)
LDL Chol Calc (NIH): 104 mg/dL — ABNORMAL HIGH (ref 0–99)
Triglycerides: 90 mg/dL (ref 0–149)
VLDL Cholesterol Cal: 17 mg/dL (ref 5–40)

## 2020-12-24 LAB — TSH: TSH: 1.85 u[IU]/mL (ref 0.450–4.500)

## 2020-12-24 LAB — HEPATITIS C ANTIBODY: Hep C Virus Ab: 0.2 s/co ratio (ref 0.0–0.9)

## 2020-12-24 NOTE — Therapy (Signed)
[place] [facility] SERVICES [address] [place], [place], [address] Phone: [phone]   Fax:  [phone]  Physical Therapy Treatment/RECERT  Patient Details  Name: Curtis [name] MRN: [id number] Date of Birth: [birth year] Referring Provider (PT): Dawayne Patricia   Encounter Date: 12/24/2020   PT End of Session - 12/24/20 1228     Visit Number 18    Number of Visits 42    Date for PT Re-Evaluation 03/18/21    Authorization Type [employer], no auth req    Authorization Time Period Cert 09/29/31-01/75/10    Progress Note Due on Visit 10    PT Start Time 1100    PT Stop Time 1146    PT Time Calculation (min) 46 min    Equipment Utilized During Treatment Gait belt    Activity Tolerance Patient tolerated treatment well    Behavior During Therapy [facility] for tasks assessed/performed             Past Medical History:  Diagnosis Date   Stroke [facility])     History reviewed. No pertinent surgical history.  There were no vitals filed for this visit.   Subjective Assessment - 12/24/20 1227     Subjective Patient reports he was at the beach over the weekend.Patient reports he is 75-80% back to where he wants to be. Wants to improve his stamina and balance. Still has dizziness when moving quickly.    Patient is accompained by: Family member   Curtis [name]   Pertinent History [name] is a 35 y.o. M with obesity no other significant PMHx presented to hospital  with acute onset nausea, dizziness. Patient had recently seen a chiropractor and being treated with cervical manipulation.  On the morning of admission he developed sudden dizziness, nausea, neck pain, and headache and so he came to the ER.    CT angiogram was obtained that showed findings consistent with dissection of the left vertebral artery.  MRI brain showed small ischemic infarct in the left lateral medulla. Pt had stay at inpatient rehab and has made significant progress in his funciton  since discharge. Pt also has 3 m.o. daughter he would like to be able to have a larger part in caring for but is restricted due to his impairments. at [place] patient presented to therapy ambulating with rollator.    Limitations Lifting;Walking;Standing    How long can you sit comfortably? no issues with sitting    How long can you stand comfortably? 5-10 min    How long can you walk comfortably? 30 min (at grocery store)    Patient Stated Goals holding daughter, being able to return to golf    Currently in Pain? No/denies                [facility] PT Assessment - 12/24/20 0001       Functional Gait  Assessment   Gait assessed  Yes    Gait Level Surface Walks 20 ft in less than 5.5 sec, no assistive devices, good speed, no evidence for imbalance, normal gait pattern, deviates no more than 6 in outside of the 12 in walkway width.    Change in Gait Speed Able to smoothly change walking speed without loss of balance or gait deviation. Deviate no more than 6 in outside of the 12 in walkway width.    Gait with Horizontal Head Turns Performs head turns smoothly with no change in gait. Deviates no more than 6 in outside 12  in walkway width    Gait with Vertical Head Turns Performs task with slight change in gait velocity (eg, minor disruption to smooth gait path), deviates 6 - 10 in outside 12 in walkway width or uses assistive device    Gait and Pivot Turn Pivot turns safely within 3 sec and stops quickly with no loss of balance.    Step Over Obstacle Is able to step over 2 stacked shoe boxes taped together (9 in total height) without changing gait speed. No evidence of imbalance.    Gait with Narrow Base of Support Ambulates 7-9 steps.    Gait with Eyes Closed Walks 20 ft, uses assistive device, slower speed, mild gait deviations, deviates 6-10 in outside 12 in walkway width. Ambulates 20 ft in less than 9 sec but greater than 7 sec.    Ambulating Backwards Walks 20 ft, no assistive devices, good  speed, no evidence for imbalance, normal gait    Steps Alternating feet, no rail.    Total Score 27                Goals:  FOTO: 82% MET FGA: 27/30 MET Ascend descend 4 steps able to perform without UE support MET 6 min walk test: 1735 ft  RUE strength: 4/5 (MET)  New Goals:  Single limb stance R 7 seconds L 5 LEFS: 65/80 Return to running  Patient reports he is 75-80% back to where he wants to be. Wants to improve his stamina and balance. Still has dizziness when moving quickly.     Treatment: Interval on treadmill: Ambulate 30 seconds at 2.7 mph then jog at 4.5-4.7 mph 20 seconds x 3 trials  TRX posterior lunge 10x each LE Squat with overhead press of weighted ball (5000 gr) 10x bosu ball flat side up: visual scan laterally to read alphabet on wall x 2 minutes  Pt educated throughout session about proper posture and technique with exercises. Improved exercise technique, movement at target joints, use of target muscles after min to mod verbal, visual, tactile cues.  Patient will benefit from decrease to 1x/week due to significant improvement in mobility but slight deficit still present. He has made significant progress and met his FOTO, FGA, stair and strength goals. New goals addressing SLS, LEFS, and return to running added to POC. He is hoping to return to work in January. Patient reports he is 75-80% back to where he wants to be. Wants to improve his stamina and balance. Still has dizziness when moving quickly. The patient will continue to benefit from skilled PT to improve dynamic balance and strengthening to increase safety as caregiver, promote independence and return to prior level of function.             PT Education - 12/24/20 1227     Education Details goals, POC    Person(s) Educated Patient    Methods Explanation;Demonstration;Tactile cues;Verbal cues    Comprehension Verbalized understanding;Returned demonstration;Verbal cues required;Tactile  cues required              PT Short Term Goals - 11/19/20 1359       PT SHORT TERM GOAL #1   Title Patient will be independent in home exercise program to improve strength/mobility for better functional independence with ADLs.    Baseline Pt has HEP from hospital discharge but it is not progressive, 10/25: doing exercises every other day    Time 4    Period Weeks    Status Achieved    Target  Date 10/30/20      PT SHORT TERM GOAL #2   Title Patient (< 59 years old) will complete five times sit to stand test in < 15 seconds indicating an increased LE strength and improved balance.    Baseline >20 sec, 11.6 seconds    Time 4    Period Weeks    Status Achieved    Target Date 10/30/20      PT SHORT TERM GOAL #3   Title Patient will increase six minute walk test distance to >1200 with LRAD for progression to community ambulator and improve gait ability    Baseline 1015 feet with rollator, 10/25: 1450 feet without AD    Time 4    Period Weeks    Status Achieved    Target Date 10/30/20               PT Long Term Goals - 12/24/20 1124       PT LONG TERM GOAL #1   Title Patient will increase FOTO score to equal to or greater than 15    to demonstrate statistically significant improvement in mobility and quality of life.    Baseline 63.5, 10/25: 61% 11/29: 82%    Time 12    Period Weeks    Status Achieved      PT LONG TERM GOAL #2   Title Patient will increase Functional Gait Assessment score to >25/30 as to reduce fall risk and improve dynamic gait safety with community ambulation.    Baseline 20, 10/25: 23/30 11/29: 27/30    Time 12    Period Weeks    Status Achieved      PT LONG TERM GOAL #3   Title Patient will ascend/descend 4 stairs without rail assist independently without loss of balance to improve ability to get in/out of home.    Baseline requires use of handrails to safely naigate stairs, 10/25: requires 1 rail assist when descending;11/29: able to perform  without UE    Time 12    Period Weeks    Status Achieved      PT LONG TERM GOAL #4   Title Patient will increase six minute walk test distance to >1800 feet without AD for progression to community ambulator and improve gait ability    Baseline 1015 with rollator, 10/25: 1450 11/29: 1735 ft    Time 12    Period Weeks    Status Partially Met    Target Date 03/18/21      PT LONG TERM GOAL #5   Title Patient will improve UE shoulder/scapular strength to 4/5 or greater to improve shoulder stability and safety when lifting daughter or doing ADLs.    Baseline grossly 3/5 11/29: grossly 4/5    Time 6    Period Weeks    Status Achieved      Additional Long Term Goals   Additional Long Term Goals Yes      PT LONG TERM GOAL #6   Title Patient will tolerate 20 seconds of single leg stance without loss of balance to improve ability to get in and out of shower safely, up and down stairs, and improve overall stability.    Baseline 11/20: R 7 seconds L 5 seconds    Time 12    Period Weeks    Status New    Target Date 03/18/21      PT LONG TERM GOAL #7   Title Patient will improve LEFS score to >75/ 80 to demonstrate improved  functional mobility and increased tolerance with ADLs.    Baseline 11/29: 65/80    Time 12    Period Weeks    Status New    Target Date 03/18/21      PT LONG TERM GOAL #8   Title Patient will return to jogging/running for 5 minutes without use of UE support for return to PLOF.    Baseline 11/29: 20 seconds with UE support    Time 12    Period Weeks    Status New    Target Date 03/18/21                   Plan - 12/24/20 1233     Clinical Impression Statement Patient will benefit from decrease to 1x/week due to significant improvement in mobility but slight deficit still present. He has made significant progress and met his FOTO, FGA, stair and strength goals. New goals addressing SLS, LEFS, and return to running added to POC. He is hoping to return to  work in January. ?Patient reports he is 75-80% back to where he wants to be. Wants to improve his stamina and balance. Still has dizziness when moving quickly. The patient will continue to benefit from skilled PT to improve dynamic balance and strengthening to increase safety as caregiver, promote independence and return to prior level of function.    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 complexity    Rehab Potential Excellent    PT Frequency 2x / week    PT Duration 12 weeks    PT Treatment/Interventions ADLs/Self Care Home Management;Neuromuscular re-education;Patient/family education;Manual techniques;Passive range of motion;Dry needling;Energy conservation;Vestibular;Visual/perceptual remediation/compensation;Therapeutic activities;Functional mobility training;Therapeutic exercise;Balance training;Gait training;Canalith Repostioning    PT Next Visit Plan Increase scapular strengthening and dynamic balance on uneven surfaces, develop HEP. Continue POC of previously indicated    PT Home Exercise Plan Access Code: [email or web address]; 10/31/2020=Access Code: [email or web address]; no updates    Consulted and Agree with Plan of Care Patient             Patient will benefit from skilled therapeutic intervention in order to improve the following deficits and impairments:  Abnormal gait, Decreased activity tolerance, Decreased balance, Decreased coordination, Decreased endurance, Decreased mobility, Decreased range of motion, Decreased strength, Difficulty walking, Dizziness, Impaired perceived functional ability, Impaired vision/preception  Visit Diagnosis: Muscle weakness (generalized)  Unsteadiness on feet  Abnormality of gait and  mobility     Problem List Patient Active Problem List   Diagnosis Date Noted   Thoracic back pain 11/01/2020   Somatic dysfunction of spine, thoracic 11/01/2020   Slow transit constipation    Morbid obesity ([place])    Neck pain    ASD (atrial septal defect)    Ischemic cerebrovascular accident (CVA) of frontal lobe ([place]) 09/13/2020   Stroke ([place]) 09/10/2020   Lateral medullary syndrome    Vertebral artery dissection ([place]) 09/09/2020   Obesity (BMI 35.0-39.9 without comorbidity) 09/09/2020   Essential hypertension 09/09/2020   Acute stroke of medulla oblongata ([place])    Janna Arch, PT, DPT  12/24/2020, 12:35 PM  [place] MAIN [facility] SERVICES [address] [place], [place], [address] Phone: [phone]   Fax:  [phone]  Name: KYNG MATLOCK MRN: [id number] Date of Birth: [birth year]

## 2020-12-24 NOTE — Progress Notes (Signed)
Hi Dean.  It was good to see you.  Your cholesterol was slightly elevated.  Recommend a low fat diet and exercise as tolerated. Otherwise, your lab work looks great.  No concerns at this time.  Follow up as discussed.

## 2020-12-26 ENCOUNTER — Ambulatory Visit: Payer: BC Managed Care – PPO | Admitting: Physical Therapy

## 2020-12-27 ENCOUNTER — Ambulatory Visit
Admission: RE | Admit: 2020-12-27 | Discharge: 2020-12-27 | Disposition: A | Discharge status: Home / Self Care | Payer: BC Managed Care – PPO | Source: Ambulatory Visit | Attending: Adult Health | Admitting: Adult Health

## 2020-12-27 ENCOUNTER — Other Ambulatory Visit: Payer: Self-pay

## 2020-12-27 DIAGNOSIS — I7774 Dissection of vertebral artery: Secondary | ICD-10-CM | POA: Diagnosis not present

## 2020-12-27 DIAGNOSIS — Z8673 Personal history of transient ischemic attack (TIA), and cerebral infarction without residual deficits: Secondary | ICD-10-CM | POA: Diagnosis not present

## 2020-12-27 DIAGNOSIS — I639 Cerebral infarction, unspecified: Secondary | ICD-10-CM

## 2020-12-27 IMAGING — CT CT ANGIO HEAD-NECK (W OR W/O PERF)
2 of 11 series · 4 of 33 positions shown · IV contrast (iopamidol)
Comparison: [DATE] CTA of the head and neck

CLINICAL DATA: Follow-up vertebral artery dissection. Stroke
[DATE]

EXAM:
CT ANGIOGRAPHY HEAD AND NECK
TECHNIQUE: Multidetector CT imaging of the head and neck was performed using
the standard protocol during bolus administration of intravenous
contrast. Multiplanar CT image reconstructions and MIPs were
obtained to evaluate the vascular anatomy. Carotid stenosis
measurements (when applicable) are obtained utilizing NASCET
criteria, using the distal internal carotid diameter as the
denominator.
CONTRAST:  75mL [OV] IOPAMIDOL ([OV]) INJECTION 61%

[Series 12: brain 3.00 hr40 s3 sag without ibhc · sagittal · non-contrast · 0.37mm/px · 1 of 64 slices shown]
[im 32/64  soft-tissue]
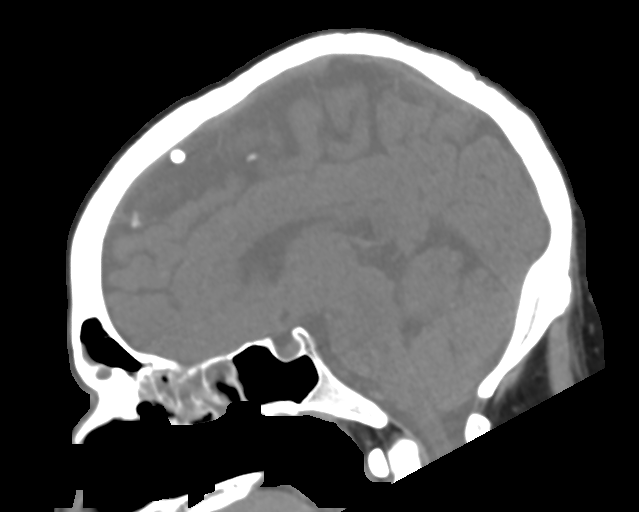

[Series 15: cta head & neck 1.00 hv48 s3 ax thin mips · axial · 0.52mm/px · z∈[-827,-441]mm · 3 of 387 slices shown]
[im 1/387  soft-tissue]
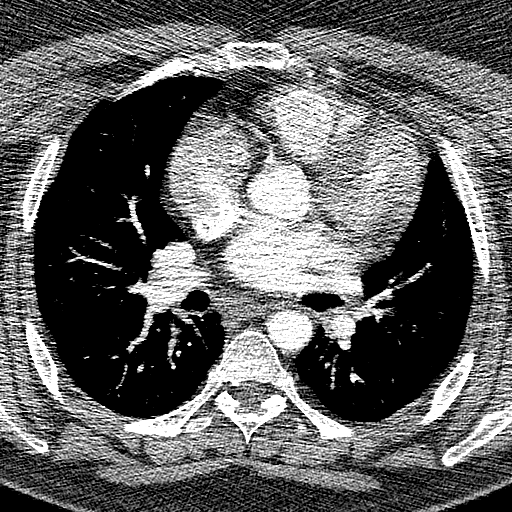
[im 194/387  bone]
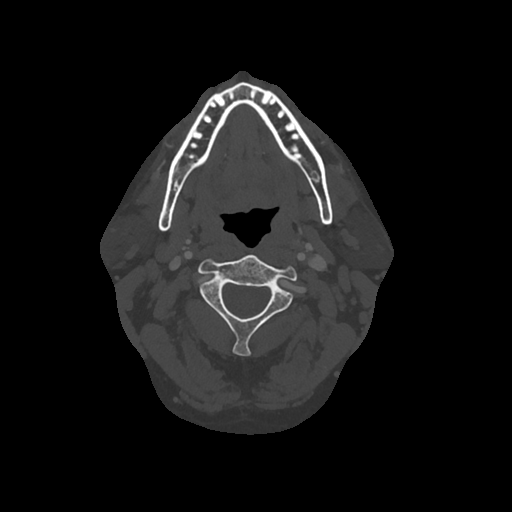
[im 387/387  soft-tissue]
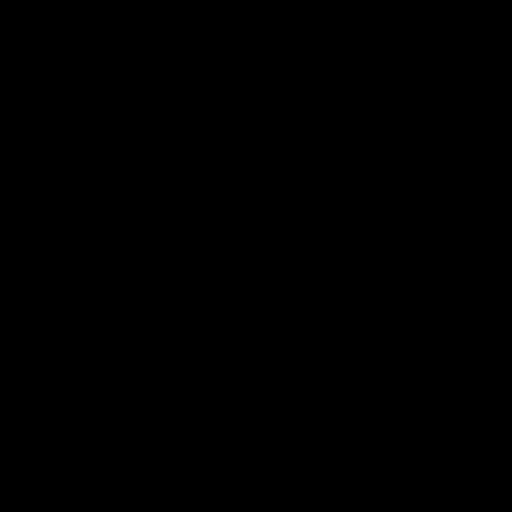

[4 of 33 positions shown; findings below may reference images not displayed]

FINDINGS: CT HEAD FINDINGS

Brain: No evidence of acute or interval infarction. No hemorrhage or
hydrocephalus.

Vascular: No hyperdense vessel or unexpected calcification.

Skull: Normal. Negative for fracture or focal lesion.

Sinuses: Imaged portions are clear.

Orbits: No acute finding.

Review of the MIP images confirms the above findings

CTA NECK FINDINGS

Aortic arch: Average right subclavian artery with retroesophageal
course.

Right carotid system: Vessels are smooth and widely patent. No
atheromatous changes or beading.

Left carotid system: Vessels are smooth and widely patent. No
atheromatous changes or beading.

Vertebral arteries: Heparin right subclavian artery. Normalized
appearance of the left cervical vertebral artery with smooth
contour. No stenosis or beading seen on the right either.

Skeleton: No acute or aggressive finding

Other neck: Extensive opacification of paranasal sinuses,
progressed.

Upper chest: Diffuse airway thickening.

Review of the MIP images confirms the above findings

CTA HEAD FINDINGS

Anterior circulation: Vessels are smooth and widely patent. No
branch occlusion, beading, or aneurysm.

Posterior circulation: Near normalized appearance of the left
vertebral artery with only subtle wasting at the midportion. The
basilar is smoothly contoured and widely patent. Widely patent
branches. Negative for aneurysm.

Venous sinuses: Diffusely patent

Anatomic variants: None significant

Review of the MIP images confirms the above findings
IMPRESSION: 1. Essentially normalized appearance of the left vertebral artery.
No evidence of baseline vasculopathy.
2. Extensive sinus opacification and bronchial airway thickening.

## 2020-12-27 MED ORDER — IOPAMIDOL (ISOVUE-300) INJECTION 61%
75.0000 mL | Freq: Once | INTRAVENOUS | Status: AC | PRN
  Administered 2020-12-27: 75 mL via INTRAVENOUS

## 2020-12-30 ENCOUNTER — Encounter: Payer: Self-pay | Admitting: Nurse Practitioner

## 2020-12-30 DIAGNOSIS — F411 Generalized anxiety disorder: Secondary | ICD-10-CM | POA: Diagnosis not present

## 2020-12-30 DIAGNOSIS — F4322 Adjustment disorder with anxiety: Secondary | ICD-10-CM | POA: Diagnosis not present

## 2020-12-30 DIAGNOSIS — J329 Chronic sinusitis, unspecified: Secondary | ICD-10-CM

## 2020-12-31 ENCOUNTER — Ambulatory Visit: Payer: BC Managed Care – PPO

## 2020-12-31 ENCOUNTER — Ambulatory Visit: Payer: BC Managed Care – PPO | Attending: Family Medicine | Admitting: Physical Therapy

## 2020-12-31 ENCOUNTER — Other Ambulatory Visit: Payer: Self-pay

## 2020-12-31 ENCOUNTER — Encounter: Payer: BC Managed Care – PPO | Admitting: Occupational Therapy

## 2020-12-31 DIAGNOSIS — R2681 Unsteadiness on feet: Secondary | ICD-10-CM | POA: Insufficient documentation

## 2020-12-31 DIAGNOSIS — R2689 Other abnormalities of gait and mobility: Secondary | ICD-10-CM | POA: Diagnosis not present

## 2020-12-31 DIAGNOSIS — M6281 Muscle weakness (generalized): Secondary | ICD-10-CM | POA: Insufficient documentation

## 2020-12-31 DIAGNOSIS — R269 Unspecified abnormalities of gait and mobility: Secondary | ICD-10-CM | POA: Diagnosis not present

## 2020-12-31 MED ORDER — DOXYCYCLINE HYCLATE 100 MG PO TABS: 100.0000 mg | ORAL_TABLET | Freq: Two times a day (BID) | ORAL | 0 refills | Status: DC

## 2020-12-31 NOTE — Therapy (Addendum)
[place] MAIN [facility] SERVICES [address] [place], [place], [address] Phone: [phone]   Fax:  [phone]  Physical Therapy Treatment  Patient Details  Name: Curtis [name] MRN: [id number] Date of Birth: 10-16-1985 Referring Provider (PT): Dawayne Patricia   Encounter Date: 12/31/2020   PT End of Session - 12/31/20 1152     Visit Number 19    Number of Visits 42    Date for PT Re-Evaluation 03/18/21    Authorization Type [employer], no auth req    Authorization Time Period Cert 07/04/60-95/28/41    Progress Note Due on Visit 10    PT Start Time 1146    PT Stop Time 1229    PT Time Calculation (min) 43 min    Equipment Utilized During Treatment Gait belt    Activity Tolerance Patient tolerated treatment well    Behavior During Therapy WFL for tasks assessed/performed             Past Medical History:  Diagnosis Date   Stroke ([place])     No past surgical history on file.  There were no vitals filed for this visit.   Subjective Assessment - 12/31/20 1148     Subjective Pt reports no dizziness or LOB since last visit. No new changes since previous visit.    Patient is accompained by: Family member   Curtis [name]   Pertinent History Curtis [name] is a 35 y.o. M with obesity no other significant PMHx presented to hospital  with acute onset nausea, dizziness. Patient had recently seen a chiropractor and being treated with cervical manipulation.  On the morning of admission he developed sudden dizziness, nausea, neck pain, and headache and so he came to the ER.    CT angiogram was obtained that showed findings consistent with dissection of the left vertebral artery.  MRI brain showed small ischemic infarct in the left lateral medulla. Pt had stay at inpatient rehab and has made significant progress in his funciton since discharge. Pt also has 3 m.o. daughter he would like to be able to have a larger part in caring for but is restricted due to  his impairments. at [facility]E. patient presented to therapy ambulating with rollator.    Limitations Lifting;Walking;Standing    How long can you sit comfortably? no issues with sitting    How long can you stand comfortably? 5-10 min    How long can you walk comfortably? 30 min (at grocery store)    Patient Stated Goals holding daughter, being able to return to golf    Currently in Pain? No/denies                Exercise/Activity Sets/Reps/Time/ Resistance Assistance Charge type Comments  Treadmill Running/ walking interval training 2.7 walk speed, 4.7 MPH running speed. 1 min walk, 30 sec run x 3 sets SBA, UE on treadmill bars  There ACT PT manually adjusted treadmill   BOSU: adducted stance  2 x 1 min  SBA, UE assistance as needed  Neuro re- ed  Occasional UE use required   BOSU hard side up 3 x 10 ball toss  Neuro-red - no weight first round, pt rated easy  -added 2 lb yellow weighted ball on second and third sets, increased postural sway noted, no LOB, good use of ankle strategy.   TRX lunge 1 x 10 ea 1 x 4 ea  There-ACT  -c/o/ knee pin on left with L LE post on second set, stopped  exercise early as a result -pt reports knee pain is present occasionally .   TRX squats  2 x 10  Setup  There ACT - no knee pain noted, cues  Lunge position with sidebending  X 10 to ea side  Neuro - re ed  Some instability noted The following activity were completed in parallel bars or at balance bar    Lunge with twist  X 10 to ea side   Neuro re ed  1 lob noted with L LE posterior, able to correct without assistance  The following activities were completed in parallel bars or at balance bar    [place] balance- golf/ putting game  X 6 holes  U UE assist Neuro re ed  - difficulty with managing force placement for proper putting distance, good utilization of ankle strategies                    Treatment Provided this session   Pt educated throughout session about proper posture and technique with  exercises. Improved exercise technique, movement at target joints, use of target muscles after min to mod verbal, visual, tactile cues.  Note: Portions of this document were prepared using Dragon voice recognition software and although reviewed may contain unintentional dictation errors in syntax, grammar, or spelling.                            PT Short Term Goals - 11/19/20 1359       PT SHORT TERM GOAL #1   Title Patient will be independent in home exercise program to improve strength/mobility for better functional independence with ADLs.    Baseline Pt has HEP from hospital discharge but it is not progressive, 10/25: doing exercises every other day    Time 4    Period Weeks    Status Achieved    Target Date 10/30/20      PT SHORT TERM GOAL #2   Title Patient (< 7 years old) will complete five times sit to stand test in < 15 seconds indicating an increased LE strength and improved balance.    Baseline >20 sec, 11.6 seconds    Time 4    Period Weeks    Status Achieved    Target Date 10/30/20      PT SHORT TERM GOAL #3   Title Patient will increase six minute walk test distance to >1200 with LRAD for progression to community ambulator and improve gait ability    Baseline 1015 feet with rollator, 10/25: 1450 feet without AD    Time 4    Period Weeks    Status Achieved    Target Date 10/30/20               PT Long Term Goals - 12/24/20 1124       PT LONG TERM GOAL #1   Title Patient will increase FOTO score to equal to or greater than 15    to demonstrate statistically significant improvement in mobility and quality of life.    Baseline 63.5, 10/25: 61% 11/29: 82%    Time 12    Period Weeks    Status Achieved      PT LONG TERM GOAL #2   Title Patient will increase Functional Gait Assessment score to >25/30 as to reduce fall risk and improve dynamic gait safety with community ambulation.    Baseline 20, 10/25: 23/30 11/29: 27/30    Time 12  Period Weeks    Status Achieved      PT LONG TERM GOAL #3   Title Patient will ascend/descend 4 stairs without rail assist independently without loss of balance to improve ability to get in/out of home.    Baseline requires use of handrails to safely naigate stairs, 10/25: requires 1 rail assist when descending;11/29: able to perform without UE    Time 12    Period Weeks    Status Achieved      PT LONG TERM GOAL #4   Title Patient will increase six minute walk test distance to >1800 feet without AD for progression to community ambulator and improve gait ability    Baseline 1015 with rollator, 10/25: 1450 11/29: 1735 ft    Time 12    Period Weeks    Status Partially Met    Target Date 03/18/21      PT LONG TERM GOAL #5   Title Patient will improve UE shoulder/scapular strength to 4/5 or greater to improve shoulder stability and safety when lifting daughter or doing ADLs.    Baseline grossly 3/5 11/29: grossly 4/5    Time 6    Period Weeks    Status Achieved      Additional Long Term Goals   Additional Long Term Goals Yes      PT LONG TERM GOAL #6   Title Patient will tolerate 20 seconds of single leg stance without loss of balance to improve ability to get in and out of shower safely, up and down stairs, and improve overall stability.    Baseline 11/20: R 7 seconds L 5 seconds    Time 12    Period Weeks    Status New    Target Date 03/18/21      PT LONG TERM GOAL #7   Title Patient will improve LEFS score to >75/ 80 to demonstrate improved functional mobility and increased tolerance with ADLs.    Baseline 11/29: 65/80    Time 12    Period Weeks    Status New    Target Date 03/18/21      PT LONG TERM GOAL #8   Title Patient will return to jogging/running for 5 minutes without use of UE support for return to PLOF.    Baseline 11/29: 20 seconds with UE support    Time 12    Period Weeks    Status New    Target Date 03/18/21                Clinical  impression statement: Continued with current plan of care as laid out in evaluation and recent prior sessions. Pt remains motivated to advance progress toward goals in order to maximize independence and safety at home. Pt requires high level assistance and cuing for completion of exercises in order to provide adequate level of stimulation and perturbation. Author allows pt as much opportunity as possible to perform independent righting strategies, only stepping in when pt is unable to prevent falling to floor.  Pt continues to demonstrate progress toward goals AEB progression of some interventions this date either in volume or intensity.    Plan - 12/31/20 1153     Personal Factors and Comorbidities Comorbidity 1;Comorbidity 2    Comorbidities obesity, HTN    Examination-Activity Limitations Caring for Others;Carry;Lift;Locomotion Level;Squat;Stairs;Stand    Examination-Participation Restrictions Driving;Occupation;Yard Work    Stability/Clinical Decision Making Evolving/Moderate complexity    Rehab Potential Excellent    PT Frequency 2x / week  PT Duration 12 weeks    PT Treatment/Interventions ADLs/Self Care Home Management;Neuromuscular re-education;Patient/family education;Manual techniques;Passive range of motion;Dry needling;Energy conservation;Vestibular;Visual/perceptual remediation/compensation;Therapeutic activities;Functional mobility training;Therapeutic exercise;Balance training;Gait training;Canalith Repostioning    PT Next Visit Plan Increase scapular strengthening and dynamic balance on uneven surfaces, develop HEP. Continue POC of previously indicated    PT Home Exercise Plan Access Code: [email or web address]; 10/31/2020=Access Code: [email or web address]; no updates    Consulted and Agree with Plan of Care Patient             Patient will benefit from skilled therapeutic intervention in order to improve the following deficits and impairments:  Abnormal gait, Decreased activity tolerance, Decreased  balance, Decreased coordination, Decreased endurance, Decreased mobility, Decreased range of motion, Decreased strength, Difficulty walking, Dizziness, Impaired perceived functional ability, Impaired vision/preception  Visit Diagnosis: Muscle weakness (generalized)  Unsteadiness on feet  Abnormality of gait and mobility     Problem List Patient Active Problem List   Diagnosis Date Noted   Thoracic back pain 11/01/2020   Somatic dysfunction of spine, thoracic 11/01/2020   Slow transit constipation    Morbid obesity ([place])    Neck pain    ASD (atrial septal defect)    Ischemic cerebrovascular accident (CVA) of frontal lobe ([place]) 09/13/2020   Stroke ([place]) 09/10/2020   Lateral medullary syndrome    Vertebral artery dissection ([place]) 09/09/2020   Obesity (BMI 35.0-39.9 without comorbidity) 09/09/2020   Essential hypertension 09/09/2020   Acute stroke of medulla oblongata ([place])     Particia Lather, PT 12/31/2020, 12:54 PM  [place] MAIN [facility] SERVICES [address] [place], [place], [address] Phone: [phone]   Fax:  [phone]  Name: Curtis [name] MRN: [id number] Date of Birth: 12-09-1985

## 2021-01-02 ENCOUNTER — Ambulatory Visit: Payer: BC Managed Care – PPO

## 2021-01-02 ENCOUNTER — Ambulatory Visit: Payer: BC Managed Care – PPO | Admitting: Physical Therapy

## 2021-01-02 ENCOUNTER — Encounter: Payer: BC Managed Care – PPO | Admitting: Occupational Therapy

## 2021-01-02 DIAGNOSIS — J019 Acute sinusitis, unspecified: Secondary | ICD-10-CM | POA: Diagnosis not present

## 2021-01-02 DIAGNOSIS — J301 Allergic rhinitis due to pollen: Secondary | ICD-10-CM | POA: Diagnosis not present

## 2021-01-06 ENCOUNTER — Ambulatory Visit: Payer: Self-pay | Admitting: Surgery

## 2021-01-06 NOTE — H&P (View-Only) (Signed)
Subjective:   CC: Biliary colic [K80.50]   HPI:  Curtis [name] is a 35 y.o. male who was referred by Katrinka Blazing for evaluation of above CC. Symptoms were first noted a few days ago. Pain is sharp, intermittent and sudden onset, at night, confined to the right upper quadrant, without radiation.  Associated with nothing specific, exacerbated by nothing specific.   Hx of  Reflux in the past, but this episode felt different.  No change in BM or urinary habits Past Medical History: includes vertebral artery dissection, s/p stroke aug 2022   Past Surgical History: no abdominal surgery   Family History: reviewed and not relevant to CC   Social History:  reports that he has never smoked. He has never used smokeless tobacco. No history on file for alcohol use and drug use.   Current Medications: has a current medication list which includes the following prescription(s): acetaminophen-codeine, aspirin, atorvastatin, cholecalciferol, clopidogrel, cyanocobalamin, fluoxetine, gabapentin, meclizine, and oxycodone.   Allergies:  Allergies as of 11/05/2020  (No Known Allergies)     ROS:  A 15 point review of systems was performed and pertinent positives and negatives noted in HPI    Objective:   BP 126/86    Pulse 82    Ht 182.9 cm (6')    Wt (!) 131.5 kg (290 lb) Comment: per pt   BMI 39.33 kg/m      Constitutional :  No distress, cooperative, alert Lymphatics/Throat:  Supple with no lymphadenopathy Respiratory:  Clear to auscultation bilaterally Cardiovascular:  Regular rate and rhythm Gastrointestinal: Soft, non-tender, non-distended, no organomegaly. Musculoskeletal: Steady gait and movement Skin: Cool and moist, no surgical scars Psychiatric: Normal affect, non-agitated, not confused       LABS:  n/a    RADS: CLINICAL DATA:  Right upper quadrant pain   EXAM:  ULTRASOUND ABDOMEN LIMITED RIGHT UPPER QUADRANT   COMPARISON:  None.   FINDINGS:  Gallbladder:   At least 2  gallstones are identified toward the neck measuring up to  1.4 cm. No wall thickening visualized. No pericholecystic fluid. No  sonographic Murphy sign noted by sonographer.   Common bile duct:   Diameter: 4.8 mm, normal   Liver:   No focal lesion identified. Increased parenchymal echogenicity.  Portal vein is patent on color Doppler imaging with normal direction  of blood flow towards the liver.   Other: None.   IMPRESSION:  Cholelithiasis without sonographic evidence of acute cholecystitis.   Increased liver echogenicity likely reflecting steatosis.    Electronically Signed    By: Guadlupe Spanish M.D.    On: 11/04/2020 08:23   Assessment:      Biliary colic [K80.50]  Hx consistent with [place] images reviewed and agree with report.   Vertebral artery dissection, s/p stroke Plan:   1. Biliary colic [K80.50] Discussed the risk of surgery including post-op infxn, seroma, biloma, chronic pain, poor-delayed wound healing, retained gallstone, conversion to open procedure, post-op SBO or ileus, and need for additional procedures to address said risks.  The risks of general anesthetic including MI, CVA, sudden death or even reaction to anesthetic medications also discussed. Alternatives include continued observation.  Benefits include possible symptom relief, prevention of complications including acute cholecystitis, pancreatitis.   Typical post operative recovery of 3-5 days rest, continued pain in area and incision sites, possible loose stools up to 4-6 weeks, also discussed.   ED return precautions given for sudden increase in RUQ pain, with possible accompanying fever, nausea, and/or vomiting.  The patient understands the risks, any and all questions were answered to the patient's satisfaction.   2. Patient has elected to proceed with surgical treatment. Procedure will be scheduled after neurology discusses possible discontinuation of plavix in next few months and also when they  are comfortable holding aspirin at least 3 days prior to surgery date.  Date will be schedule late December, after his next neurology appt.   Robotic assisted laparoscopic

## 2021-01-06 NOTE — H&P (Signed)
Subjective: °  °CC: Biliary colic [K80.50] °  °HPI: ° Curtis [name] is a 35 y.o. male who was referred by Smith for evaluation of above CC. Symptoms were first noted a few days ago. Pain is sharp, intermittent and sudden onset, at night, confined to the right upper quadrant, without radiation.  Associated with nothing specific, exacerbated by nothing specific. °  °Hx of  Reflux in the past, but this episode felt different.  No change in BM or urinary habits °Past Medical History: includes vertebral artery dissection, s/p stroke aug 2022 °  °Past Surgical History: no abdominal surgery °  °Family History: reviewed and not relevant to CC °  °Social History:  reports that he has never smoked. He has never used smokeless tobacco. No history on file for alcohol use and drug use. °  °Current Medications: has a current medication list which includes the following prescription(s): acetaminophen-codeine, aspirin, atorvastatin, cholecalciferol, clopidogrel, cyanocobalamin, fluoxetine, gabapentin, meclizine, and oxycodone. °  °Allergies:  °Allergies as of 11/05/2020 ° (No Known Allergies) °  °  °ROS:  °A 15 point review of systems was performed and pertinent positives and negatives noted in HPI  °  °Objective: °  °BP 126/86    Pulse 82    Ht 182.9 cm (6')    Wt (!) 131.5 kg (290 lb) Comment: per pt   BMI 39.33 kg/m²  °  °  °Constitutional :  No distress, cooperative, alert °Lymphatics/Throat:  Supple with no lymphadenopathy °Respiratory:  Clear to auscultation bilaterally °Cardiovascular:  Regular rate and rhythm °Gastrointestinal: Soft, non-tender, non-distended, no organomegaly. °Musculoskeletal: Steady gait and movement °Skin: Cool and moist, no surgical scars °Psychiatric: Normal affect, non-agitated, not confused °    °  °LABS:  °n/a  °  °RADS: °CLINICAL DATA:  Right upper quadrant pain  ° °EXAM:  °ULTRASOUND ABDOMEN LIMITED RIGHT UPPER QUADRANT  ° °COMPARISON:  None.  ° °FINDINGS:  °Gallbladder:  ° °At least 2  gallstones are identified toward the neck measuring up to  °1.4 cm. No wall thickening visualized. No pericholecystic fluid. No  °sonographic Murphy sign noted by sonographer.  ° °Common bile duct:  ° °Diameter: 4.8 mm, normal  ° °Liver:  ° °No focal lesion identified. Increased parenchymal echogenicity.  °Portal vein is patent on color Doppler imaging with normal direction  °of blood flow towards the liver.  ° °Other: None.  ° °IMPRESSION:  °Cholelithiasis without sonographic evidence of acute cholecystitis.  ° °Increased liver echogenicity likely reflecting steatosis.  ° ° °Electronically Signed  °  By: Praneil  Patel M.D.  °  On: 11/04/2020 08:23 °  °Assessment: °  °   °Biliary colic [K80.50]  °Hx consistent with US images reviewed and agree with report. °  °Vertebral artery dissection, s/p stroke °Plan: °  °1. Biliary colic [K80.50] °Discussed the risk of surgery including post-op infxn, seroma, biloma, chronic pain, poor-delayed wound healing, retained gallstone, conversion to open procedure, post-op SBO or ileus, and need for additional procedures to address said risks.  The risks of general anesthetic including MI, CVA, sudden death or even reaction to anesthetic medications also discussed. Alternatives include continued observation.  Benefits include possible symptom relief, prevention of complications including acute cholecystitis, pancreatitis. °  °Typical post operative recovery of 3-5 days rest, continued pain in area and incision sites, possible loose stools up to 4-6 weeks, also discussed. °  °ED return precautions given for sudden increase in RUQ pain, with possible accompanying fever, nausea, and/or vomiting. °  °  the risks, any and all questions were answered to the patient's satisfaction.   2. Patient has elected to proceed with surgical treatment. Procedure will be scheduled after neurology discusses possible discontinuation of plavix in next few months and also when they  are comfortable holding aspirin at least 3 days prior to surgery date.  Date will be schedule late December, after his next neurology appt.   Robotic assisted laparoscopic

## 2021-01-07 ENCOUNTER — Ambulatory Visit: Payer: BC Managed Care – PPO

## 2021-01-07 ENCOUNTER — Encounter: Payer: BC Managed Care – PPO | Admitting: Occupational Therapy

## 2021-01-07 ENCOUNTER — Ambulatory Visit: Payer: BC Managed Care – PPO | Admitting: Physical Therapy

## 2021-01-09 ENCOUNTER — Encounter: Payer: BC Managed Care – PPO | Admitting: Occupational Therapy

## 2021-01-09 ENCOUNTER — Ambulatory Visit: Payer: BC Managed Care – PPO

## 2021-01-13 ENCOUNTER — Other Ambulatory Visit: Payer: Self-pay

## 2021-01-13 ENCOUNTER — Encounter
Admission: RE | Admit: 2021-01-13 | Discharge: 2021-01-13 | Disposition: A | Discharge status: Home / Self Care | Payer: BC Managed Care – PPO | Source: Ambulatory Visit | Attending: Surgery | Admitting: Surgery

## 2021-01-13 ENCOUNTER — Encounter: Payer: Self-pay | Admitting: Surgery

## 2021-01-13 HISTORY — DX: Anxiety disorder, unspecified: F41.9

## 2021-01-13 HISTORY — DX: Cardiac murmur, unspecified: R01.1

## 2021-01-13 NOTE — Patient Instructions (Signed)
Your procedure is scheduled on: 01/23/21 Report to [place]. To find out your arrival time please call [phone] between 1PM - 3PM on 01/22/21.  Remember: Instructions that are not followed completely may result in serious medical risk, up to and including death, or upon the discretion of your surgeon and anesthesiologist your surgery may need to be rescheduled.     _X__ 1. Do not eat food after midnight the night before your procedure.                 No gum chewing or hard candies. You may drink clear liquids up to 2 hours                 before you are scheduled to arrive for your surgery- DO not drink clear                 liquids within 2 hours of the start of your surgery.                 Clear Liquids include:  water, apple juice without pulp, clear carbohydrate                 drink such as Clearfast or Gatorade, Black Coffee or Tea (Do not add                 anything to coffee or tea). Diabetics water only  __X__2.  On the morning of surgery brush your teeth with toothpaste and water, you                 may rinse your mouth with mouthwash if you wish.  Do not swallow any              toothpaste of mouthwash.     _X__ 3.  No Alcohol for 24 hours before or after surgery.   _X__ 4.  Do Not Smoke or use e-cigarettes For 24 Hours Prior to Your Surgery.                 Do not use any chewable tobacco products for at least 6 hours prior to                 surgery.  ____  5.  Bring all medications with you on the day of surgery if instructed.   __X__  6.  Notify your doctor if there is any change in your medical condition      (cold, fever, infections).     Do not wear jewelry, make-up, hairpins, clips or nail polish. Do not wear lotions, powders, or perfumes.  Do not shave body hair 48 hours prior to surgery. Men may shave face and neck. Do not bring valuables to the hospital.    [facility] is not responsible for any  belongings or valuables.  Contacts, dentures/partials or body piercings may not be worn into surgery. Bring a case for your contacts, glasses or hearing aids, a denture cup will be supplied. Leave your suitcase in the car. After surgery it may be brought to your room. For patients admitted to the hospital, discharge time is determined by your treatment team.   Patients discharged the day of surgery will not be allowed to drive home.   Please read over the following fact sheets that you were given:     __X__ Take these medicines the morning of surgery with A SIP OF WATER:  1. FLUoxetine (PROZAC) 40 MG capsule  2.   3.   4.  5.  6.  ____ Fleet Enema (as directed)   __X__ Use CHG Soap/SAGE wipes as directed  You may pick this up at our office, [address] [facility] or you may use "Gold" bar Dial soap as discussed.  ____ Use inhalers on the day of surgery  ____ Stop metformin/Janumet/Farxiga 2 days prior to surgery    ____ Take 1/2 of usual insulin dose the night before surgery. No insulin the morning          of surgery.   ____ Stop Blood Thinners Coumadin/Plavix/Xarelto/Pleta/Pradaxa/Eliquis/Effient/Aspirin  on   Or contact your Surgeon, Cardiologist or Medical Doctor regarding  ability to stop your blood thinners  __X__ Stop Anti-inflammatories 7 days before surgery such as Advil, Ibuprofen, Motrin,  BC or Goodies Powder, Naprosyn, Naproxen, Aleve, Aspirin    __X__ Stop all herbals and supplements, fish oil or vitamins for 7 days prior to surgery.    ____ Bring C-Pap to the hospital.

## 2021-01-13 NOTE — Progress Notes (Signed)
Perioperative Services Pre-Admission/Anesthesia Testing    Date: 01/13/21  Name: Curtis [name] MRN:   [id number]  Re: Curtis [name] with anesthesia regarding recent CVA  Planned Surgical Procedure(s):    Case: [id number] Date/Time: 01/23/21 0715   Procedure: XI ROBOTIC ASSISTED LAPAROSCOPIC CHOLECYSTECTOMY (Abdomen)   Anesthesia type: General   Pre-op diagnosis: K80.50 biliary colic   Location: [facility] OR ROOM 04 / [facility]   Surgeons: Curtis Amabile, DO   Clinical Notes:  Patient scheduled for the above procedure on 01/23/2021 with Curtis Harrier, MD.  PAT nurse forwarded chart to me for perioperative review and clearance to proceed with planned surgical intervention.  In review of his history, it is noted the patient suffered an acute CVA involving the LEFT lateral thalamus secondary to LEFT vertebral artery dissection.  Dissection was believed to be related to recent chiropractic manipulation.  Patient was admitted to the hospital and neurology was consulted.    During his admission, patient had a TTE performed that revealed a normal left ventricular systolic function with an EF of 65-70%.  An incidental finding was made of an ASD with (+) IAS on color-flow Doppler.  TEE with bubble study was recommended, however does not appear that this was performed.  Clinical note from neurology dated 10/17/2020 Curtis Baton, NP-C), indicated that patient was "evaluated by Dr. Roda [name] who recommended continuation of aspirin and Plavix for 3 months and follow-up with repeat CT head/neck 2-3 months.  EF 65-70%, however ASD by color Doppler flow likely to be asymptomatic incidental finding and not associated with current stroke which was felt most likely due to VA dissection from neck manipulation.  He was advised to follow-up with cardiology outpatient for further management". To date, patient has not followed up with outpatient cardiology.   Reviewed patient with attending anesthesiologist on call  Curtis Slick, MD).  Brief case report of the pertinent findings discussed.  MD was advised that we are reaching out to neurology for input and clearance prior to proceeding.  Questioned whether or not we need to have patient seen by cardiology prior to the aforementioned surgical intervention.  Anesthesiologist felt that cardiology consult not required prior to surgery citing that neurology specifically commented that CVA was not felt to be related to the ASD (see bolded comment above).  Impression and Plan:  Curtis [name] status post recent CVA secondary to vertebral artery dissection.  Incidental finding made of ASD on TTE during admission.  Patient case details discussed with anesthesiology, and no cardiology consult is felt to be required prior to proceeding with planned surgical intervention. As previously mentioned, we did reach out to patient's primary attending neurology provider for input and clearance.  Per neurology, "patient is optimized for surgery and may proceed with an overall LOW to MODERATE risk.  There is a small but acceptable risk of stroke if ASA needed to be on hold.  If so, can hold 3-5 days prior to surgery and restart immediately after.  Efforts should be made to avoid hypotension".  Of note, in review of his medication reconciliation, it is noted that patient is not currently taking daily low-dose ASA.  Additionally, he has completed intended course of daily clopidogrel therapy and this medication has also been discontinued.  Pre-surgical instructions were reviewed with the patient during his PAT appointment and questions were fielded by PAT clinical staff. Patient was advised that if any questions or concerns arise prior to his procedure then he should return a call to PAT and/or  his surgeon's office to discuss.  Curtis Mulling, MSN, APRN, FNP-C, CEN [facility]  Peri-operative Services Nurse Practitioner Phone: [phone] 01/13/21 11:37 AM  NOTE: This note has  been prepared using Dragon dictation software. Despite my best ability to proofread, there is always the potential that unintentional transcriptional errors may still occur from this process.

## 2021-01-14 ENCOUNTER — Encounter
Payer: BC Managed Care – PPO | Attending: Physical Medicine and Rehabilitation | Admitting: Physical Medicine & Rehabilitation

## 2021-01-14 ENCOUNTER — Other Ambulatory Visit: Payer: Self-pay

## 2021-01-14 ENCOUNTER — Ambulatory Visit: Payer: BC Managed Care – PPO

## 2021-01-14 ENCOUNTER — Telehealth: Payer: Self-pay | Admitting: *Deleted

## 2021-01-14 ENCOUNTER — Encounter: Payer: BC Managed Care – PPO | Admitting: Occupational Therapy

## 2021-01-14 ENCOUNTER — Encounter: Payer: Self-pay | Admitting: Physical Medicine & Rehabilitation

## 2021-01-14 VITALS — BP 128/93 | HR 86 | Temp 98.6°F | Ht 72.0 in | Wt 318.0 lb

## 2021-01-14 DIAGNOSIS — I7774 Dissection of vertebral artery: Secondary | ICD-10-CM | POA: Diagnosis not present

## 2021-01-14 DIAGNOSIS — G464 Cerebellar stroke syndrome: Secondary | ICD-10-CM | POA: Diagnosis not present

## 2021-01-14 NOTE — Telephone Encounter (Signed)
Received surgical clearance letter from [facility] re: lap choley on 01/23/21. Form completed, signed by Shanda Bumps NP and faxed to [place].  Received confirmation.

## 2021-01-14 NOTE — Progress Notes (Signed)
Subjective:    Patient ID: Curtis [name], male    DOB: Aug 19, 1985, 35 y.o.   MRN: [id number] Brief HPI:   AYDIN HINK is a 35 y.o. right-handed male with history of obesity BMI 36.62.  Per chart review lives with spouse including 66-month-old daughter.  Independent prior to admission.  Works full-time from home.  Presented to [facility] 09/09/2020 left-sided numbness headache dizziness as well as nausea x1 week.  Per report recently presented to chiropractor 2 weeks ago for regular back adjustments.  Denied having any symptoms at that time however he had some persistent on and off neck pain x3 weeks.  MRI of the brain showed a small acute stroke of the lateral left medulla.  CTA head neck irregularity of the V2 segment of the left vertebral artery with sites of up to mild to moderate stenosis.  Irregularity of the V4 left vertebral artery with sites of up to moderate/severe stenosis.  MRI of the brain follow-up more conspicuous signal abnormality in the left medulla that could possibly evolutionary change of previous stroke.  CT angiogram head and neck repeated 09/11/2020 showing persistent findings of left vertebral artery dissection extracranially and intracranially.  Slightly increased narrowing at the level of the C2 transverse foramen.  Neurology follow-up maintained on aspirin and Plavix x3 months.  Patient would require repeat vascular imaging in 3 months to determine need to continue DAPT.  Therapy evaluations completed due to patient decreased functional  Admit date: 09/13/2020 Discharge date: 09/26/2020 HPI 35 year old male with history of left vertebral artery dissection felt to be related to chiropractic manipulation which resulted in left medullary infarct.  Overall the patient is doing very well.  He is independent with all self-care and mobility.  He has resumed driving. Done with outpatient OT, continues to receive outpatient PT  Scheduled to go back to work, first week January based on  estimates.   Still having some blurring of vision, has been seeing neuro-ophthalmology Dr. Karleen Hampshire     Pain Inventory Average Pain 0 Pain Right Now 0 My pain is  no pain  LOCATION OF PAIN  right arm & right neck numbness  BOWEL Number of stools per week: 7 Oral laxative use No   BLADDER Normal  Mobility how many minutes can you walk? No problem  walking. On balance issue ability to climb steps?  yes do you drive?  yes Do you have any goals in this area?  yes  Function employed # of hrs/week On medical leave.  Neuro/Psych weakness numbness tingling dizziness  Prior Studies Any changes since last visit?  no  Physicians involved in your care Any changes since last visit?  no   Family History  Problem Relation Age of Onset   COPD Maternal Grandmother    Arthritis Maternal Grandmother    Alcohol abuse Maternal Grandfather    Heart attack Maternal Grandfather    Social History   Socioeconomic History   Marital status: Married    Spouse name: Not on file   Number of children: Not on file   Years of education: Not on file   Highest education level: Not on file  Occupational History   Not on file  Tobacco Use   Smoking status: Former    Types: Cigars   Smokeless tobacco: Never  Vaping Use   Vaping Use: Former  Substance and Sexual Activity   Alcohol use: Yes    Alcohol/week: 2.0 standard drinks    Types: 2 Shots of liquor  per week   Drug use: Never   Sexual activity: Yes  Other Topics Concern   Not on file  Social History Narrative   Not on file   Social Determinants of Health   Financial Resource Strain: Not on file  Food Insecurity: Not on file  Transportation Needs: Not on file  Physical Activity: Not on file  Stress: Not on file  Social Connections: Not on file   No past surgical history on file. Past Medical History:  Diagnosis Date   Acute cerebrovascular accident (CVA) of medulla oblongata (HCC) 09/09/2020   a.) small acute  infarct of the lateral LEFT medulla in setting of LEFT vertebral artery dissection.   Anxiety    ASD (atrial septal defect) 09/14/2020   a.) TTE 09/14/2020: EF 65-70%; (+) IAS noted on color flow Doppler.   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   There were no vitals taken for this visit.  Opioid Risk Score:   Fall Risk Score:  `1  Depression screen PHQ 2/9  Depression screen [facility] 2/9 12/23/2020 11/14/2020 10/17/2020  Decreased Interest 0 0 0  Down, Depressed, Hopeless 0 0 0  PHQ - 2 Score 0 0 0  Altered sleeping 0 1 -  Tired, decreased energy 1 1 -  Change in appetite 1 0 -  Feeling bad or failure about yourself  0 0 -  Trouble concentrating 1 1 -  Moving slowly or fidgety/restless 0 0 -  Suicidal thoughts 0 0 -  PHQ-9 Score 3 3 -  Difficult doing work/chores Not difficult at all Somewhat difficult -    Review of Systems  Neurological:  Positive for dizziness, weakness and numbness.       Right arm & right side of neck numbness  All other systems reviewed and are negative.     Objective:   Physical Exam Vitals and nursing note reviewed.  Constitutional:      Appearance: He is obese.  HENT:     Head: Normocephalic and atraumatic.  Eyes:     Extraocular Movements: Extraocular movements intact.     Conjunctiva/sclera: Conjunctivae normal.     Pupils: Pupils are equal, round, and reactive to light.  Skin:    General: Skin is warm and dry.  Neurological:     General: No focal deficit present.     Mental Status: He is alert and oriented to person, place, and time.     Motor: Motor function is intact.     Coordination: Coordination is intact.     Gait: Gait is intact.     Comments: Motor strength is 5/5 bilateral deltoid, bicep, tricep grip, hip flexor, knee extensor, ankle dorsiflexor plantar flexor  Psychiatric:        Mood and Affect: Mood normal.        Behavior: Behavior normal.   Sensation reduced pinprick right hemifacial as well  as right upper extremity       Assessment & Plan:   1.  Left vertebral artery dissection with medullary infarct overall making good progress, his main residual deficit is with vision and balance.  I do think he can go back to work starting January as long as he takes 5-minute breaks every hour to reduce eyestrain.  He will follow-up with neuro-ophthalmology as well.  I will see him back in 6 months.  He will need follow-up with neurology as well as primary care physician.

## 2021-01-15 ENCOUNTER — Ambulatory Visit: Payer: BC Managed Care – PPO | Admitting: Physical Therapy

## 2021-01-15 DIAGNOSIS — M6281 Muscle weakness (generalized): Secondary | ICD-10-CM

## 2021-01-15 DIAGNOSIS — R2689 Other abnormalities of gait and mobility: Secondary | ICD-10-CM

## 2021-01-15 DIAGNOSIS — R269 Unspecified abnormalities of gait and mobility: Secondary | ICD-10-CM

## 2021-01-15 DIAGNOSIS — R2681 Unsteadiness on feet: Secondary | ICD-10-CM | POA: Diagnosis not present

## 2021-01-15 NOTE — Therapy (Signed)
[place] MAIN [facility] SERVICES [address] [place], [place], [address] Phone: [phone]   Fax:  [phone]  Physical Therapy Treatment/ discharge summary/Physical Therapy Progress Note   Dates of reporting period  11/19/20   to   01/15/21   Patient Details  Name: Curtis [name] MRN: [id number] Date of Birth: [birth year] Referring Provider (PT): Dawayne Patricia   Encounter Date: 01/15/2021   PT End of Session - 01/15/21 1022     Visit Number 20    Number of Visits 42    Date for PT Re-Evaluation 03/18/21    Authorization Type [employer], no auth req    Authorization Time Period Cert 01/28/22-40/10/27    Progress Note Due on Visit 30    PT Start Time 1017    PT Stop Time 1100    PT Time Calculation (min) 43 min    Equipment Utilized During Treatment Gait belt    Activity Tolerance Patient tolerated treatment well    Behavior During Therapy WFL for tasks assessed/performed             Past Medical History:  Diagnosis Date   Acute cerebrovascular accident (CVA) of medulla oblongata ([place]) 09/09/2020   a.) small acute infarct of the lateral LEFT medulla in setting of LEFT vertebral artery dissection.   Anxiety    ASD (atrial septal defect) 09/14/2020   a.) TTE 09/14/2020: EF 65-70%; (+) IAS noted on color flow Doppler.   Heart murmur    LEFT vertebral artery dissection ([place]) 09/09/2020    No past surgical history on file.  There were no vitals filed for this visit.   Subjective Assessment - 01/15/21 1020     Subjective Pt reports no dizziness or LOB since last visit. No new changes since previous visit. Reports some soreness at start of session today    Patient is accompained by: Family member   Curtis [name]   Pertinent History Curtis [name] is a 35 y.o. M with obesity no other significant PMHx presented to hospital  with acute onset nausea, dizziness. Patient had recently seen a chiropractor and being treated with cervical  manipulation.  On the morning of admission he developed sudden dizziness, nausea, neck pain, and headache and so he came to the ER.    CT angiogram was obtained that showed findings consistent with dissection of the left vertebral artery.  MRI brain showed small ischemic infarct in the left lateral medulla. Pt had stay at inpatient rehab and has made significant progress in his funciton since discharge. Pt also has 3 m.o. daughter he would like to be able to have a larger part in caring for but is restricted due to his impairments. at I.E. patient presented to therapy ambulating with rollator.    Limitations Lifting;Walking;Standing    How long can you sit comfortably? no issues with sitting    How long can you stand comfortably? 5-10 min    How long can you walk comfortably? 30 min (at grocery store)    Patient Stated Goals holding daughter, being able to return to golf               Exercise/Activity Sets/Reps/Time/ Resistance Assistance Charge type Comments  Treadmill Running/ walking interval training 2.7 walk speed, 4.7 MPH running speed. 1 min walk, 30 sec run x 3 sets SBA, UE on treadmill bars  There ACT PT manually adjusted treadmill  Min c/o knee soreness on the left with running   SLS trials x  3 ea LE  See goals section for best results on ea LE   Neuro  Utilization of hip and ankle strategies   Tandem balance  HEP  2 x 45 sec ea   Neuro  Good utilization of hip and ankle strategies, occasional use of stepping strategy when needed   Adducted stance: head turns and EC 2 x 45 sec ea   neuro Rated easy, min sway noted   Staggered stance EC HEP 2 x 45 sec   Neuro  Utilization of hip and ankle strategies   6MWT 1880 feet   TherACt Good pace throughout, some fatigue noted toward end   Marching no UE support  HEP 2 x 10   Neuro Cues for good eccentric control to work on dynamic SLS                          Treatment Provided this session   Pt educated throughout session about  proper posture and technique with exercises. Improved exercise technique, movement at target joints, use of target muscles after min to mod verbal, visual, tactile cues.  Note: Portions of this document were prepared using Dragon voice recognition software and although reviewed may contain unintentional dictation errors in syntax, grammar, or spelling.                              PT Education - 01/15/21 1022     Education Details exercise form and technique    Person(s) Educated Patient    Methods Explanation;Demonstration    Comprehension Verbalized understanding;Returned demonstration;Verbal cues required;Tactile cues required              PT Short Term Goals - 11/19/20 1359       PT SHORT TERM GOAL #1   Title Patient will be independent in home exercise program to improve strength/mobility for better functional independence with ADLs.    Baseline Pt has HEP from hospital discharge but it is not progressive, 10/25: doing exercises every other day    Time 4    Period Weeks    Status Achieved    Target Date 10/30/20      PT SHORT TERM GOAL #2   Title Patient (< 86 years old) will complete five times sit to stand test in < 15 seconds indicating an increased LE strength and improved balance.    Baseline >20 sec, 11.6 seconds    Time 4    Period Weeks    Status Achieved    Target Date 10/30/20      PT SHORT TERM GOAL #3   Title Patient will increase six minute walk test distance to >1200 with LRAD for progression to community ambulator and improve gait ability    Baseline 1015 feet with rollator, 10/25: 1450 feet without AD    Time 4    Period Weeks    Status Achieved    Target Date 10/30/20               PT Long Term Goals - 01/15/21 1204       PT LONG TERM GOAL #1   Title Patient will increase FOTO score to equal to or greater than 15    to demonstrate statistically significant improvement in mobility and quality of life.    Baseline  63.5, 10/25: 61% 11/29: 82%    Time 12    Period Weeks  Status Achieved      PT LONG TERM GOAL #2   Title Patient will increase Functional Gait Assessment score to >25/30 as to reduce fall risk and improve dynamic gait safety with community ambulation.    Baseline 20, 10/25: 23/30 11/29: 27/30    Time 12    Period Weeks    Status Achieved      PT LONG TERM GOAL #3   Title Patient will ascend/descend 4 stairs without rail assist independently without loss of balance to improve ability to get in/out of home.    Baseline requires use of handrails to safely naigate stairs, 10/25: requires 1 rail assist when descending;11/29: able to perform without UE    Time 12    Period Weeks    Status Achieved      PT LONG TERM GOAL #4   Title Patient will increase six minute walk test distance to >1800 feet without AD for progression to community ambulator and improve gait ability    Baseline 1015 with rollator, 10/25: 1450 11/29: 1735 ft 12/21 1880 feet    Time 12    Period Weeks    Status Achieved    Target Date 03/18/21      PT LONG TERM GOAL #5   Title Patient will improve UE shoulder/scapular strength to 4/5 or greater to improve shoulder stability and safety when lifting daughter or doing ADLs.    Baseline grossly 3/5 11/29: grossly 4/5    Time 6    Period Weeks    Status Achieved      PT LONG TERM GOAL #6   Title Patient will tolerate 20 seconds of single leg stance without loss of balance to improve ability to get in and out of shower safely, up and down stairs, and improve overall stability.    Baseline 11/20: R 7 seconds L 5 seconds 12/21 R 9 sec L 12 sec , pt does reprot he believes this is same as his pre morbid balance    Time 12    Period Weeks    Status Partially Met    Target Date 03/18/21      PT LONG TERM GOAL #7   Title Patient will improve LEFS score to >75/ 80 to demonstrate improved functional mobility and increased tolerance with ADLs.    Baseline 11/29: 65/80  12/21: 69/80    Time 12    Period Weeks    Status Partially Met    Target Date 03/18/21      PT LONG TERM GOAL #8   Title Patient will return to jogging/running for 5 minutes without use of UE support for return to PLOF.    Baseline 11/29: 20 seconds with UE support 12/21: 35 sec with UE support    Time 12    Period Weeks    Status New    Target Date 03/18/21                   Plan - 01/15/21 1023     Clinical Impression Statement Pt presents to PT for final PT treatment session and subsequent discharge from PT services at this time. Pt has met or nearly met all goals and feels comfortable progressing at home with his remaining goals. Pt has made significant progress during his bout of physical therapy and is very happy with his overall progress. Pt will be formally discharged from PT at this time with HEP. Pt ambulated 1880 feet with 6MWt today and was able tp oerform  running activities on treadmill at interval level. Pt will continue to work on his balance training and his running endurance with advice of PT and will contact clinic if he has any concerns or future needs.    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 complexity    Rehab Potential Excellent    PT Frequency 2x / week    PT Duration 12 weeks    PT Treatment/Interventions ADLs/Self Care Home Management;Neuromuscular re-education;Patient/family education;Manual techniques;Passive range of motion;Dry needling;Energy conservation;Vestibular;Visual/perceptual remediation/compensation;Therapeutic activities;Functional mobility training;Therapeutic exercise;Balance training;Gait training;Canalith Repostioning    PT Next Visit Plan Increase scapular  strengthening and dynamic balance on uneven surfaces, develop HEP. Continue POC of previously indicated    PT Home Exercise Plan Access Code: [email or web address]; 10/31/2020=Access Code: [email or web address]; no updates    Consulted and Agree with Plan of Care Patient             Patient will benefit from skilled therapeutic intervention in order to improve the following deficits and impairments:  Abnormal gait, Decreased activity tolerance, Decreased balance, Decreased coordination, Decreased endurance, Decreased mobility, Decreased range of motion, Decreased strength, Difficulty walking, Dizziness, Impaired perceived functional ability, Impaired vision/preception  Visit Diagnosis: Muscle weakness (generalized)  Unsteadiness on feet  Abnormality of gait and mobility  Other abnormalities of gait and mobility     Problem List Patient Active Problem List   Diagnosis Date Noted   Thoracic back pain 11/01/2020   Somatic dysfunction of spine, thoracic 11/01/2020   Slow transit constipation    Morbid obesity (HCC)    Neck pain    ASD (atrial septal defect)    Ischemic cerebrovascular accident (CVA) of frontal lobe ([place]) 09/13/2020   Stroke ([place]) 09/10/2020   Lateral medullary syndrome    Vertebral artery dissection ([place]) 09/09/2020   Obesity (BMI 35.0-39.9 without comorbidity) 09/09/2020   Essential hypertension 09/09/2020   Acute stroke of medulla oblongata ([place])     Particia Lather, PT 01/15/2021, 12:06 PM  [place] MAIN [facility] SERVICES [address] [place], [place], [address] Phone: [phone]   Fax:  [phone]  Name: TRAVON CROCHET MRN: [id number] Date of Birth: 1985-11-12

## 2021-01-16 ENCOUNTER — Encounter: Payer: BC Managed Care – PPO | Admitting: Occupational Therapy

## 2021-01-16 ENCOUNTER — Ambulatory Visit: Payer: BC Managed Care – PPO

## 2021-01-17 ENCOUNTER — Ambulatory Visit: Payer: BC Managed Care – PPO

## 2021-01-17 ENCOUNTER — Other Ambulatory Visit: Payer: Self-pay

## 2021-01-17 ENCOUNTER — Emergency Department: Payer: BC Managed Care – PPO

## 2021-01-17 ENCOUNTER — Emergency Department
Admission: EM | Admit: 2021-01-17 | Discharge: 2021-01-17 | Disposition: A | Discharge status: Home / Self Care | Payer: BC Managed Care – PPO | Attending: Emergency Medicine | Admitting: Emergency Medicine

## 2021-01-17 DIAGNOSIS — I10 Essential (primary) hypertension: Secondary | ICD-10-CM | POA: Insufficient documentation

## 2021-01-17 DIAGNOSIS — R1011 Right upper quadrant pain: Secondary | ICD-10-CM

## 2021-01-17 DIAGNOSIS — K8050 Calculus of bile duct without cholangitis or cholecystitis without obstruction: Secondary | ICD-10-CM | POA: Diagnosis not present

## 2021-01-17 DIAGNOSIS — K8020 Calculus of gallbladder without cholecystitis without obstruction: Secondary | ICD-10-CM | POA: Diagnosis not present

## 2021-01-17 DIAGNOSIS — Z79899 Other long term (current) drug therapy: Secondary | ICD-10-CM | POA: Insufficient documentation

## 2021-01-17 DIAGNOSIS — Z87891 Personal history of nicotine dependence: Secondary | ICD-10-CM | POA: Diagnosis not present

## 2021-01-17 LAB — COMPREHENSIVE METABOLIC PANEL
ALT: 25 U/L (ref 0–44)
AST: 27 U/L (ref 15–41)
Albumin: 4.2 g/dL (ref 3.5–5.0)
Alkaline Phosphatase: 86 U/L (ref 38–126)
Anion gap: 5 (ref 5–15)
BUN: 16 mg/dL (ref 6–20)
CO2: 26 mmol/L (ref 22–32)
Calcium: 8.9 mg/dL (ref 8.9–10.3)
Chloride: 106 mmol/L (ref 98–111)
Creatinine, Ser: 0.81 mg/dL (ref 0.61–1.24)
GFR, Estimated: >60 mL/min (ref >60)
Glucose, Bld: 111 mg/dL — ABNORMAL HIGH (ref 70–99)
Potassium: 4.5 mmol/L (ref 3.5–5.1)
Sodium: 137 mmol/L (ref 135–145)
Total Bilirubin: 0.6 mg/dL (ref 0.3–1.2)
Total Protein: 7.6 g/dL (ref 6.5–8.1)

## 2021-01-17 LAB — CBC
HCT: 47.3 % (ref 39.0–52.0)
Hemoglobin: 16.6 g/dL (ref 13.0–17.0)
MCH: 30.4 pg (ref 26.0–34.0)
MCHC: 35.1 g/dL (ref 30.0–36.0)
MCV: 86.6 fL (ref 80.0–100.0)
Platelets: 296 10*3/uL (ref 150–400)
RBC: 5.46 MIL/uL (ref 4.22–5.81)
RDW: 12.3 % (ref 11.5–15.5)
WBC: 15.3 10*3/uL — ABNORMAL HIGH (ref 4.0–10.5)
nRBC: 0 % (ref 0.0–0.2)

## 2021-01-17 LAB — LIPASE, BLOOD: Lipase: 43 U/L (ref 11–51)

## 2021-01-17 IMAGING — US US ABDOMEN LIMITED
1 series · 14 of 25 positions shown · non-contrast
Comparison: US Abdomen, [DATE].

CLINICAL DATA: RIGHT upper quadrant pain.

EXAM:
ULTRASOUND ABDOMEN LIMITED RIGHT UPPER QUADRANT

[Series 1: us abdomen limited ruq (liver/gb) · 14 of 40 slices shown]
[im 1/40]
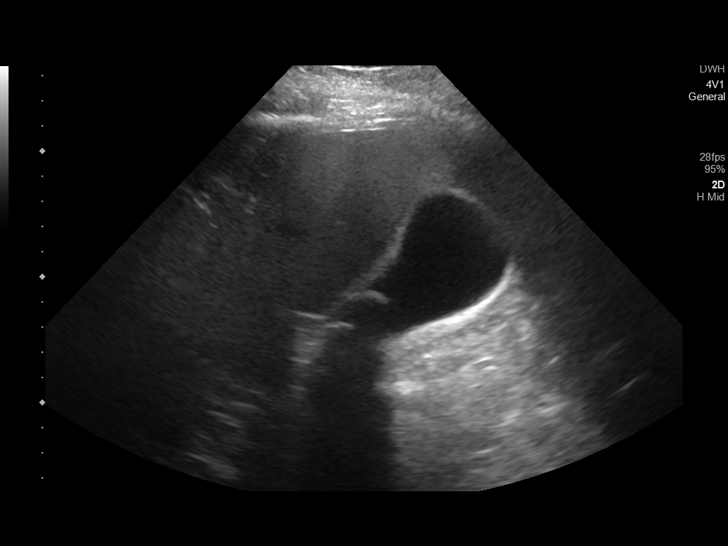
[im 4/40]
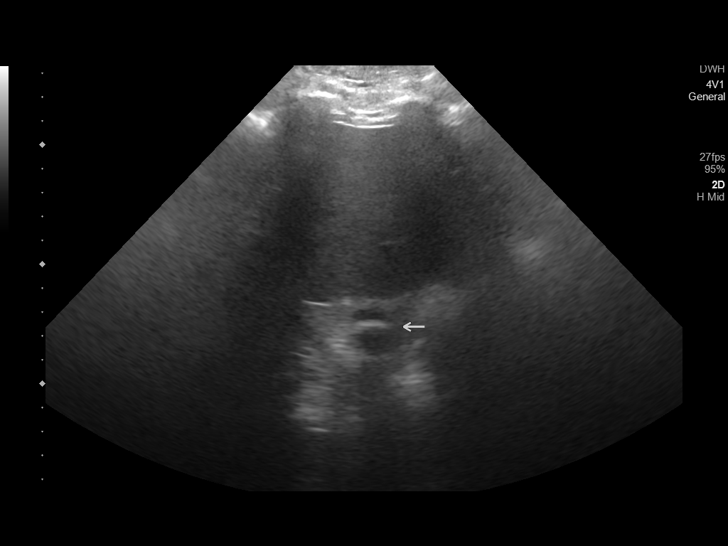
[im 7/40]
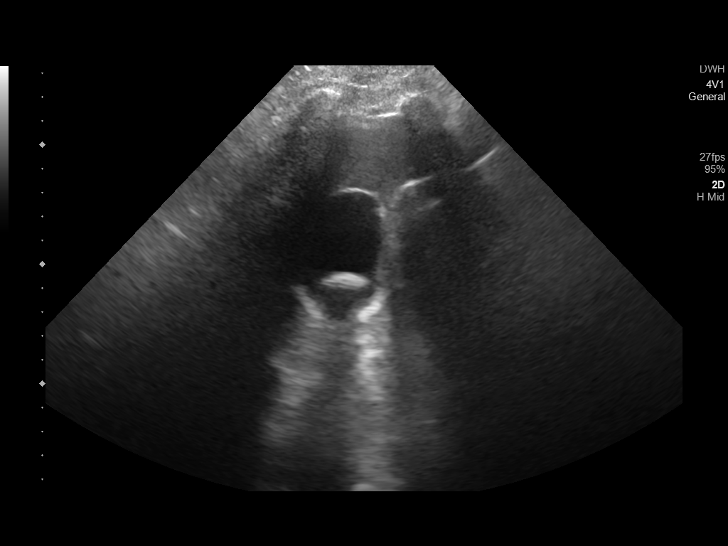
[im 10/40]
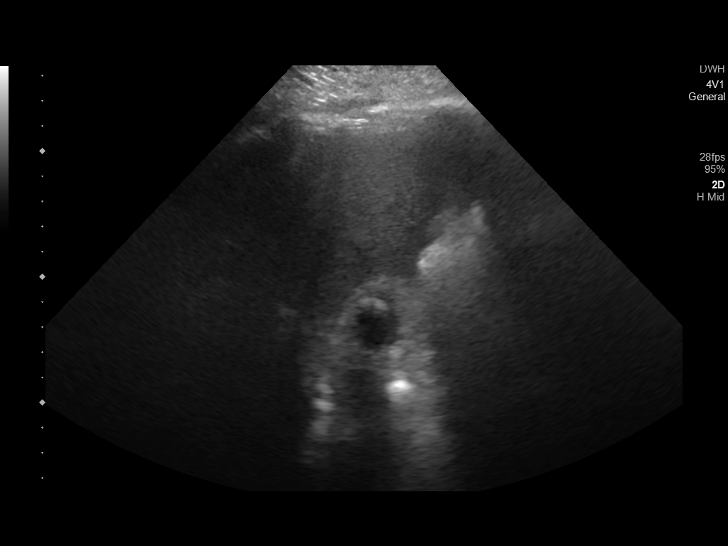
[im 14/40]
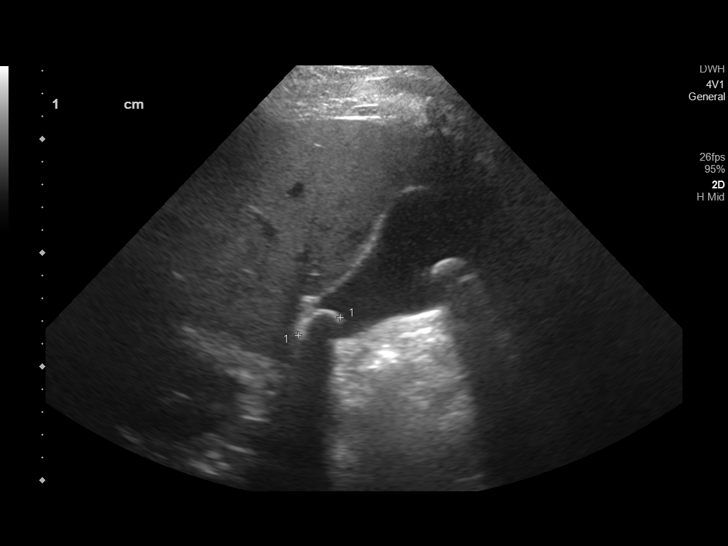
[im 15/40]
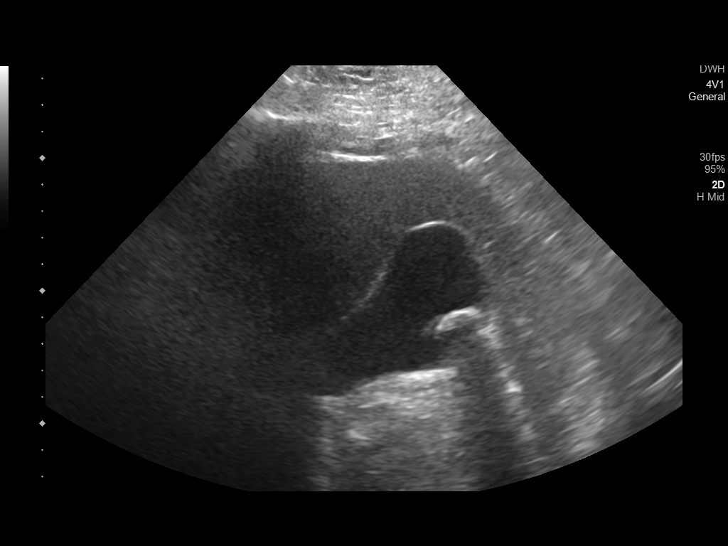
[im 18/40]
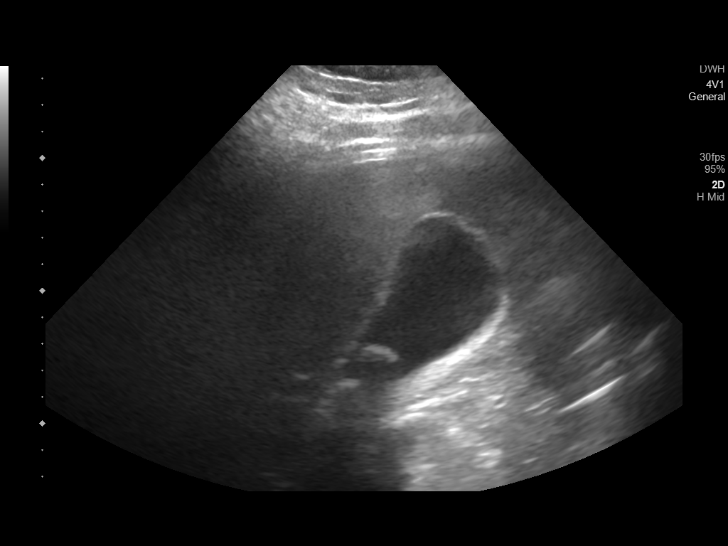
[im 22/40]
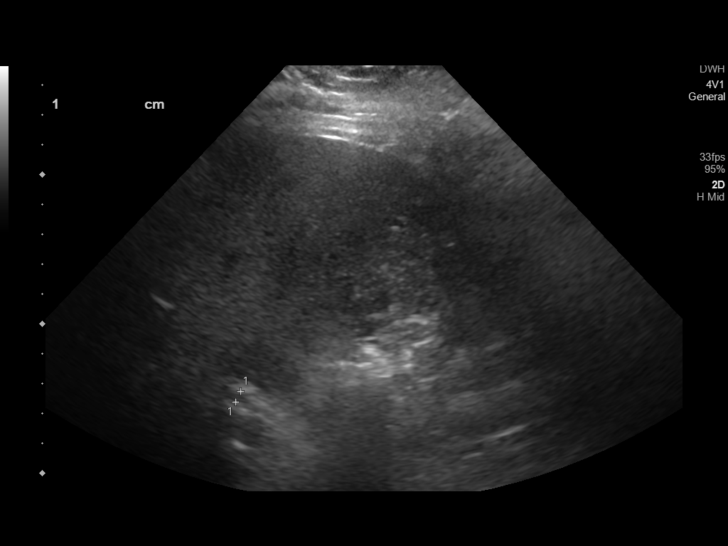
[im 25/40]
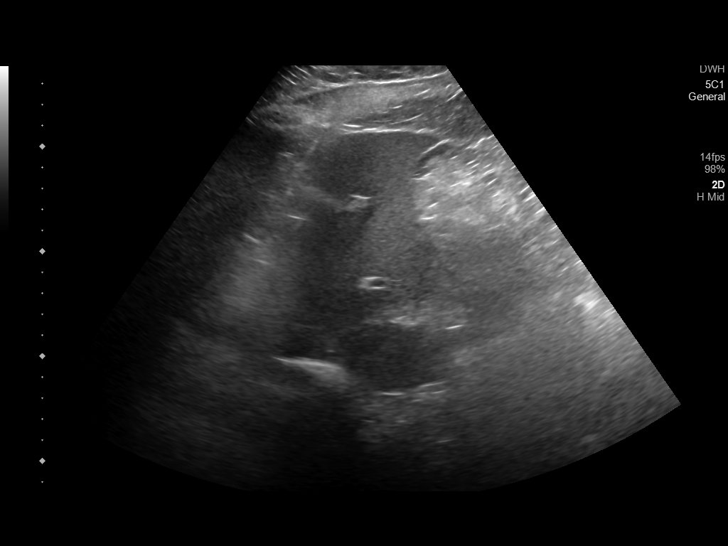
[im 27/40]
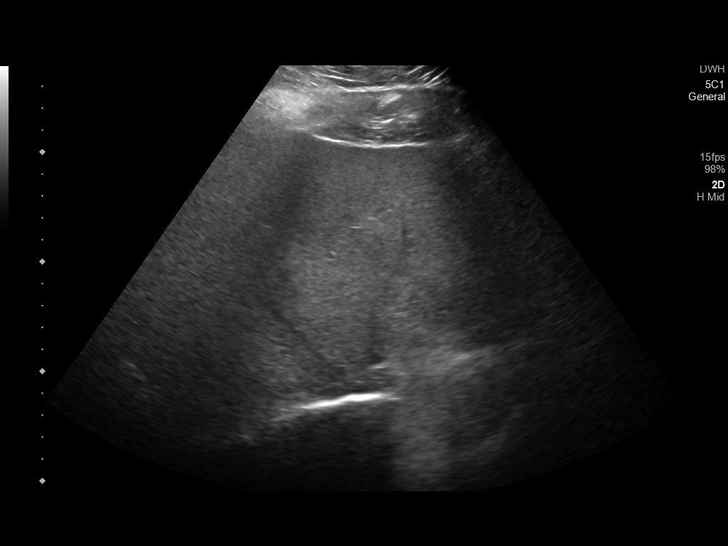
[im 30/40]
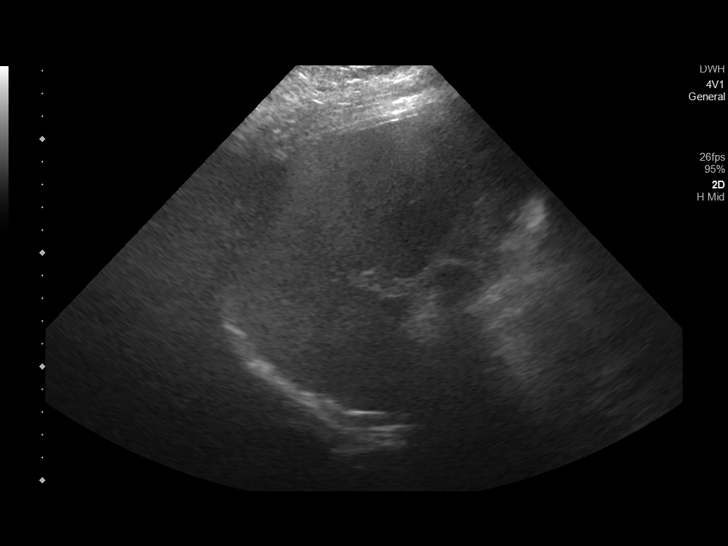
[im 33/40]
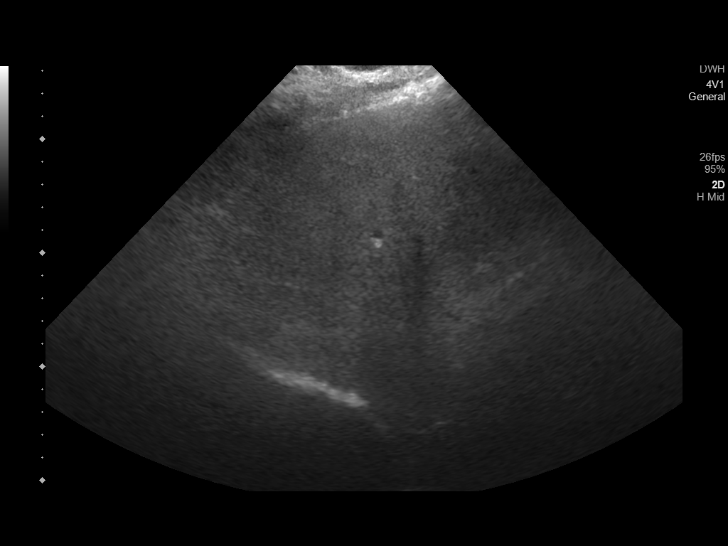
[im 36/40]
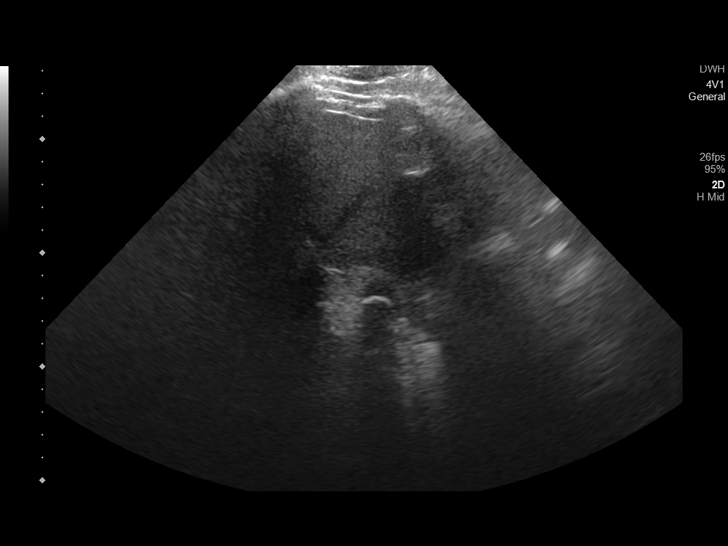
[im 40/40]
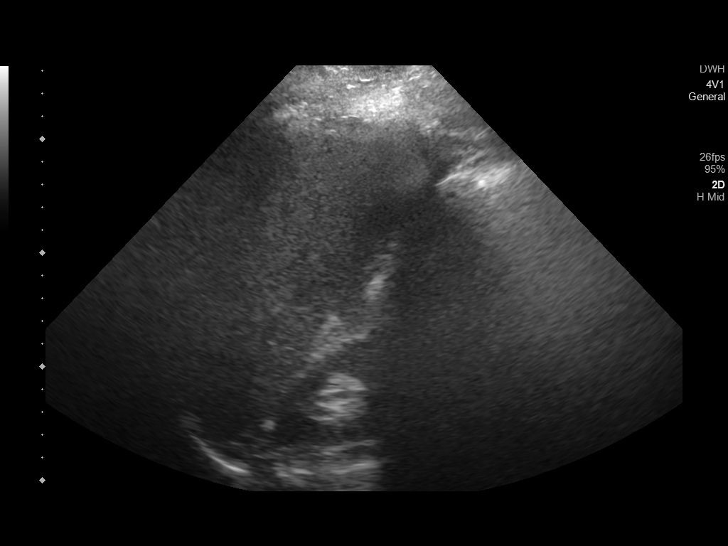

[14 of 25 positions shown; findings below may reference images not displayed]

FINDINGS: Gallbladder:

Dependent moderately-sized shadowing gallstones within a minimally
distended gallbladder. Largest calculus measures approximately
cm and is located at the gallbladder neck. No pericholecystic fluid.
Gallbladder wall measures at the upper limit of normal in thickness,
at 0.4 cm. No sonographic Murphy sign noted by sonographer.

Common bile duct:

Diameter: 0.4 cm

Liver:

No focal lesion identified. Increased hepatic parenchymal
echogenicity. Portal vein is patent on color Doppler imaging with
normal direction of blood flow towards the liver.

Other: No perihepatic ascites
IMPRESSION: 1. Cholelithiasis and minimal gallbladder wall thickening, without
additional sonographic findings to suggest acute cholecystitis.
If continued clinical concern for biliary dyskinesia, consider
non-emergent, outpatient NM HIDA scan for further evaluation.
2. Echogenic liver. Findings most commonly seen in hepatic
steatosis, though may also represent hepatitis and/or fibrosis.

## 2021-01-17 MED ORDER — AMOXICILLIN-POT CLAVULANATE 875-125 MG PO TABS: 1.0000 | ORAL_TABLET | Freq: Two times a day (BID) | ORAL | 0 refills | Status: AC

## 2021-01-17 NOTE — ED Triage Notes (Signed)
Pt from home.  Gallbladder sx scheduled next week.  Sudden RUQ pain woke him from sleep about midnight.  Took oxy with no relief.  Nausea and some diarrhea.  No vomiting.

## 2021-01-17 NOTE — Discharge Instructions (Addendum)
Please return to the ER immediately for any worsening pain in your abdomen, vomiting, or fever.  You should stay in touch with your general surgeon and keep your appointment as scheduled for surgery on December 29.  Please take all antibiotics until done, you may continue to take oxycodone and Zofran as needed for pain and nausea.

## 2021-01-17 NOTE — ED Provider Notes (Signed)
----------------------------------------- °  7:47 AM on 01/17/2021 -----------------------------------------  Blood pressure 133/85, pulse 76, temperature 97.7 F (36.5 C), temperature source Oral, resp. rate 17, SpO2 96 %.  Assuming care from Dr. Elesa Massed.  In short, Curtis [name] is a 35 y.o. male with a chief complaint of Abdominal Pain .  Refer to the original H&P for additional details.  The current plan of care is to follow-up RUQ ultrasound for worsening pain in setting of known cholelithiasis.  ----------------------------------------- 8:28 AM on 01/17/2021 ----------------------------------------- Right upper quadrant ultrasound shows borderline increased gallbladder wall thickness at 4 mm, labs remarkable for leukocytosis at WBC 15.  Findings were discussed with Dr. Everlene Farrier of general surgery along with Dr. Tonna Boehringer of general surgery and patient was offered admission for cholecystectomy later this afternoon versus discharge home with strict return precautions.  Patient states that he prefers to be discharged home at this time so that he may spend Christmas with family.  He was given strict return precautions and general surgery recommends prescribing 1 week of Augmentin, patient with additional pain and nausea medication available at home.  He was counseled to return to the ED for new any new or worsening symptoms, patient agrees with plan.    Chesley Noon, MD 01/17/21 [phone]

## 2021-01-17 NOTE — ED Provider Notes (Signed)
[facility] Emergency Department Provider Note  ____________________________________________   Event Date/Time   First MD Initiated Contact with Patient 01/17/21 [id number]     (approximate)  I have reviewed the triage vital signs and the nursing notes.   HISTORY  Chief Complaint Abdominal Pain    HPI Curtis [name] is a 35 y.o. male with history of ASD, previous left vertebral artery dissection with associated CVA who presents to the emergency department complaints of right upper quadrant abdominal pain, nausea that started tonight after eating pizza for dinner.  He took his oxycodone at home but when pain did not improve after couple of hours, he came to the emergency department.  States pain has now improved to a dull pain now and he declines any further medication.  No fevers, vomiting, diarrhea, chest pain or shortness of breath.  States he has known history of gallstones and has scheduled cholecystectomy next week.        Past Medical History:  Diagnosis Date   Acute cerebrovascular accident (CVA) of medulla oblongata ([place]) 09/09/2020   a.) small acute infarct of the lateral LEFT medulla in setting of LEFT vertebral artery dissection.   Anxiety    ASD (atrial septal defect) 09/14/2020   a.) TTE 09/14/2020: EF 65-70%; (+) IAS noted on color flow Doppler.   Heart murmur    LEFT vertebral artery dissection ([place]) 09/09/2020    Patient Active Problem List   Diagnosis Date Noted   Thoracic back pain 11/01/2020   Somatic dysfunction of spine, thoracic 11/01/2020   Slow transit constipation    Morbid obesity (HCC)    Neck pain    ASD (atrial septal defect)    Ischemic cerebrovascular accident (CVA) of frontal lobe ([place]) 09/13/2020   Stroke ([place]) 09/10/2020   Lateral medullary syndrome    Vertebral artery dissection ([place]) 09/09/2020   Obesity (BMI 35.0-39.9 without comorbidity) 09/09/2020   Essential hypertension 09/09/2020   Acute stroke of medulla  oblongata (HCC)     No past surgical history on file.  Prior to Admission medications   Medication Sig Start Date End Date Taking? Authorizing Provider  cefdinir (OMNICEF) 300 MG capsule Take 300 mg by mouth 2 (two) times daily. Patient not taking: Reported on 01/14/2021 01/02/21   [provider]  cholecalciferol (VITAMIN D3) 25 MCG (1000 UNIT) tablet Take 1,000 Units by mouth daily.    [provider]  doxycycline (VIBRA-TABS) 100 MG tablet Take 1 tablet (100 mg total) by mouth 2 (two) times daily. Patient not taking: Reported on 01/07/2021 12/31/20   [name], NP  FLUoxetine (PROZAC) 40 MG capsule Take 1 capsule (40 mg total) by mouth daily. 11/18/20 11/18/21  Raulkar, Clide Deutscher, MD  fluticasone (FLONASE) 50 MCG/ACT nasal spray Place 2 sprays into both nostrils daily.    [provider]  meclizine (ANTIVERT) 50 MG tablet Take 1 tablet (50 mg total) by mouth 3 (three) times daily. Patient not taking: Reported on 01/14/2021 12/02/20   Izora Ribas, MD  vitamin B-12 (CYANOCOBALAMIN) 1000 MCG tablet Take 1,000 mcg by mouth daily.    [provider]  atorvastatin (LIPITOR) 40 MG tablet Take 1 tablet (40 mg total) by mouth daily. 10/17/20 11/14/20  Frann Rider, NP    Allergies Patient has no known allergies.  Family History  Problem Relation Age of Onset   COPD Maternal Grandmother    Arthritis Maternal Grandmother    Alcohol abuse Maternal Grandfather    Heart attack  Maternal Grandfather     Social History Social History   Tobacco Use   Smoking status: Former    Types: Cigars   Smokeless tobacco: Never  Vaping Use   Vaping Use: Former  Substance Use Topics   Alcohol use: Yes    Alcohol/week: 2.0 standard drinks    Types: 2 Shots of liquor per week   Drug use: Never    Review of Systems Constitutional: No fever. Eyes: No visual changes. ENT: No sore throat. Cardiovascular: Denies chest pain. Respiratory: Denies  shortness of breath. Gastrointestinal: No vomiting, diarrhea. Genitourinary: Negative for dysuria. Musculoskeletal: Negative for back pain. Skin: Negative for rash. Neurological: Negative for focal weakness or numbness.  ____________________________________________   PHYSICAL EXAM:  VITAL SIGNS: ED Triage Vitals  Enc Vitals Group     BP 01/17/21 0212 (!) 153/103     Pulse Rate 01/17/21 0212 66     Resp 01/17/21 0212 18     Temp 01/17/21 0212 98.2 F (36.8 C)     Temp Source 01/17/21 0212 Oral     SpO2 01/17/21 0212 98 %     Weight --      Height --      Head Circumference --      Peak Flow --      Pain Score 01/17/21 0156 8     Pain Loc --      Pain Edu? --      Excl. in [place]? --    CONSTITUTIONAL: Alert and oriented and responds appropriately to questions. Well-appearing; well-nourished HEAD: Normocephalic EYES: Conjunctivae clear, pupils appear equal, EOM appear intact ENT: normal nose; moist mucous membranes NECK: Supple, normal ROM CARD: RRR; S1 and S2 appreciated; no murmurs, no clicks, no rubs, no gallops RESP: Normal chest excursion without splinting or tachypnea; breath sounds clear and equal bilaterally; no wheezes, no rhonchi, no rales, no hypoxia or respiratory distress, speaking full sentences ABD/GI: Normal bowel sounds; non-distended; soft, non-tender, no rebound, no guarding, no peritoneal signs, no hepatosplenomegaly, negative Murphy sign BACK: The back appears normal EXT: Normal ROM in all joints; no deformity noted, no edema; no cyanosis SKIN: Normal color for age and race; warm; no rash on exposed skin NEURO: Moves all extremities equally PSYCH: The patient's mood and manner are appropriate.  ____________________________________________   LABS (all labs ordered are listed, but only abnormal results are displayed)  Labs Reviewed  COMPREHENSIVE METABOLIC PANEL - Abnormal; Notable for the following components:      Result Value   Glucose, Bld 111 (*)     All other components within normal limits  CBC - Abnormal; Notable for the following components:   WBC 15.3 (*)    All other components within normal limits  LIPASE, BLOOD   ____________________________________________  EKG   EKG Interpretation  Date/Time:  Friday January 17 2021 03:21:15 EST Ventricular Rate:  72 PR Interval:  154 QRS Duration: 86 QT Interval:  392 QTC Calculation: 429 R Axis:   40 Text Interpretation: Sinus rhythm with Premature atrial complexes Possible Inferior infarct , age undetermined Abnormal ECG No significant change since last tracing Confirmed by Pryor Curia [id number]) on 01/17/2021 5:35:45 AM       ____________________________________________  RADIOLOGY Jessie Foot Wille Aubuchon, personally viewed and evaluated these images (plain radiographs) as part of my medical decision making, as well as reviewing the written report by the radiologist.  ED MD interpretation:  pending  Official radiology report(s): No results found.  ____________________________________________   PROCEDURES  Procedure(s) performed (including Critical Care):  Procedures   ____________________________________________   INITIAL IMPRESSION / ASSESSMENT AND PLAN / ED COURSE  As part of my medical decision making, I reviewed the following data within the electronic MEDICAL RECORD NUMBER Nursing notes reviewed and incorporated, Labs reviewed , EKG interpreted , Old EKG reviewed, Old chart reviewed, and Notes from prior ED visits         Patient here with complaints of increasing upper abdominal pain and nausea after eating pizza.  Has known history of biliary colic.  Labs obtained from triage do show a leukocytosis today which could be due to pain but also concerning for possible cholecystitis.  Differential also includes cholangitis, choledocholithiasis, gallstone pancreatitis.  Doubt appendicitis, ACS, PE, dissection, colitis, diverticulitis, bowel obstruction.  Will obtain  right upper quadrant ultrasound.  He declines any further pain and nausea medicine at this time.  ED PROGRESS  Right upper quadrant ultrasound pending.  Signed out the oncoming ED physician.   I reviewed all nursing notes and pertinent previous records as available.  I have reviewed and interpreted any EKGs, lab and urine results, imaging (as available).  ____________________________________________   FINAL CLINICAL IMPRESSION(S) / ED DIAGNOSES  Final diagnoses:  RUQ abdominal pain     ED Discharge Orders     None       *Please note:  Curtis [name] was evaluated in Emergency Department on 01/17/2021 for the symptoms described in the history of present illness. He was evaluated in the context of the global COVID-19 pandemic, which necessitated consideration that the patient might be at risk for infection with the SARS-CoV-2 virus that causes COVID-19. Institutional protocols and algorithms that pertain to the evaluation of patients at risk for COVID-19 are in a state of rapid change based on information released by regulatory bodies including the CDC and federal and state organizations. These policies and algorithms were followed during the patient's care in the ED.  Some ED evaluations and interventions may be delayed as a result of limited staffing during and the pandemic.*   Note:  This document was prepared using Dragon voice recognition software and may include unintentional dictation errors.    Lynnox Girten, Layla Maw, DO 01/17/21 [phone]

## 2021-01-17 NOTE — ED Notes (Signed)
Pt taken to US

## 2021-01-21 ENCOUNTER — Ambulatory Visit: Payer: BC Managed Care – PPO

## 2021-01-21 ENCOUNTER — Encounter: Payer: BC Managed Care – PPO | Admitting: Occupational Therapy

## 2021-01-21 NOTE — Progress Notes (Signed)
[name] [address] [place] [address] Phone: [phone] Subjective:   Curtis [name], am serving as a scribe for Dr. Antoine Primas. This visit occurred during the SARS-CoV-2 public health emergency.  Safety protocols were in place, including screening questions prior to the visit, additional usage of staff PPE, and extensive cleaning of exam room while observing appropriate contact time as indicated for disinfecting solutions.   I'm seeing this patient by the request  of:  Larae Grooms, NP  CC: Back pain  VVO:[id number]  Curtis [name] is a 35 y.o. male coming in with complaint of back and neck pain. OMT on 11/01/2020. Patient states neck has gotten stiff. Back has been catching when going from flexion to extension. General stiffness in back.   Medications patient has been prescribed: None  Taking:  Patient repeat CT angio of the head in December.  This was a poorly visualized by me showing that there was no continued artery dissection.  Seems to have completely resolved.       Reviewed prior external information including notes and imaging from previsou exam, outside providers and external EMR if available.   As well as notes that were available from care everywhere and other healthcare systems.  Past medical history, social, surgical and family history all reviewed in electronic medical record.  No pertanent information unless stated regarding to the chief complaint.   Past Medical History:  Diagnosis Date   Acute cerebrovascular accident (CVA) of medulla oblongata (HCC) 09/09/2020   a.) small acute infarct of the lateral LEFT medulla in setting of LEFT vertebral artery dissection.   Anxiety    ASD (atrial septal defect) 09/14/2020   a.) TTE 09/14/2020: EF 65-70%; (+) IAS noted on color flow Doppler.   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    No Known Allergies   Review of Systems:  No  headache, visual changes, nausea, vomiting, diarrhea, constipation, dizziness, abdominal pain, skin rash, fevers, chills, night sweats, weight loss, swollen lymph nodes, body aches, joint swelling, chest pain, shortness of breath, mood changes. POSITIVE muscle aches  Objective  Blood pressure 128/88, pulse 97, weight (!) 320 lb (145.2 kg), SpO2 97 %.   General: No apparent distress alert and oriented x3 mood and affect normal, dressed appropriately.  HEENT: Pupils equal, extraocular movements intact  Respiratory: Patient's speak in full sentences and does not appear short of breath  Cardiovascular: No lower extremity edema, non tender, no erythema  Low back exam does have significant tightness noted.  Seems to be more dull in the thoracolumbar juncture.  Also has significant tightness noted in the sacroiliac joint right greater than left as well.   Osteopathic findings  DEFER ED NECK ALWAYS T9 extended rotated and side bent left L2 flexed rotated and side bent right Sacrum right on right       Assessment and Plan:  Thoracic back pain Patient had more tightness in the thoracolumbar juncture today.  We discussed with patient about the core strengthening.  Does respond well to manipulation.  Discussed ergonomic changes that he can use during work.  Responding well to the manipulation and follow-up again in 6 to 8 weeks    Nonallopathic problems  Decision today to treat with OMT was based on Physical Exam  After verbal consent patient was treated with HVLA, ME, FPR techniques in  thoracic, lumbar, and sacral  areas  Patient tolerated the procedure well with improvement in  symptoms  Patient given exercises, stretches and lifestyle modifications  See medications in patient instructions if given  Patient will follow up in 4-8 weeks      The above documentation has been reviewed and is accurate and complete [name], DO        Note: This dictation was prepared with  Dragon dictation along with smaller phrase technology. Any transcriptional errors that result from this process are unintentional.

## 2021-01-22 ENCOUNTER — Other Ambulatory Visit: Payer: Self-pay

## 2021-01-22 ENCOUNTER — Ambulatory Visit (INDEPENDENT_AMBULATORY_CARE_PROVIDER_SITE_OTHER): Payer: BC Managed Care – PPO | Admitting: Family Medicine

## 2021-01-22 VITALS — BP 128/88 | HR 97 | Wt 320.0 lb

## 2021-01-22 DIAGNOSIS — M546 Pain in thoracic spine: Secondary | ICD-10-CM | POA: Diagnosis not present

## 2021-01-22 DIAGNOSIS — M9902 Segmental and somatic dysfunction of thoracic region: Secondary | ICD-10-CM | POA: Diagnosis not present

## 2021-01-22 DIAGNOSIS — M9903 Segmental and somatic dysfunction of lumbar region: Secondary | ICD-10-CM

## 2021-01-22 DIAGNOSIS — M9904 Segmental and somatic dysfunction of sacral region: Secondary | ICD-10-CM | POA: Diagnosis not present

## 2021-01-22 DIAGNOSIS — G8929 Other chronic pain: Secondary | ICD-10-CM

## 2021-01-22 NOTE — Assessment & Plan Note (Signed)
Patient had more tightness in the thoracolumbar juncture today.  We discussed with patient about the core strengthening.  Does respond well to manipulation.  Discussed ergonomic changes that he can use during work.  Responding well to the manipulation and follow-up again in 6 to 8 weeks

## 2021-01-23 ENCOUNTER — Ambulatory Visit: Payer: BC Managed Care – PPO | Admitting: Urgent Care

## 2021-01-23 ENCOUNTER — Encounter
Admission: RE | Disposition: A | Discharge status: Home / Self Care | Payer: Self-pay | Source: Home / Self Care | Attending: Surgery

## 2021-01-23 ENCOUNTER — Ambulatory Visit: Payer: BC Managed Care – PPO

## 2021-01-23 ENCOUNTER — Encounter: Payer: BC Managed Care – PPO | Admitting: Occupational Therapy

## 2021-01-23 ENCOUNTER — Encounter: Payer: Self-pay | Admitting: Surgery

## 2021-01-23 ENCOUNTER — Ambulatory Visit
Admission: RE | Admit: 2021-01-23 | Discharge: 2021-01-23 | Disposition: A | Discharge status: Home / Self Care | Payer: BC Managed Care – PPO | Attending: Surgery | Admitting: Surgery

## 2021-01-23 DIAGNOSIS — K810 Acute cholecystitis: Secondary | ICD-10-CM

## 2021-01-23 DIAGNOSIS — Z6835 Body mass index (BMI) 35.0-35.9, adult: Secondary | ICD-10-CM | POA: Diagnosis not present

## 2021-01-23 DIAGNOSIS — K8010 Calculus of gallbladder with chronic cholecystitis without obstruction: Secondary | ICD-10-CM | POA: Diagnosis not present

## 2021-01-23 DIAGNOSIS — Q211 Atrial septal defect, unspecified: Secondary | ICD-10-CM | POA: Diagnosis not present

## 2021-01-23 DIAGNOSIS — K8050 Calculus of bile duct without cholangitis or cholecystitis without obstruction: Secondary | ICD-10-CM | POA: Diagnosis not present

## 2021-01-23 DIAGNOSIS — Z8673 Personal history of transient ischemic attack (TIA), and cerebral infarction without residual deficits: Secondary | ICD-10-CM | POA: Diagnosis not present

## 2021-01-23 DIAGNOSIS — E669 Obesity, unspecified: Secondary | ICD-10-CM | POA: Diagnosis not present

## 2021-01-23 HISTORY — PX: GALLBLADDER SURGERY: SHX652

## 2021-01-23 SURGERY — CHOLECYSTECTOMY, ROBOT-ASSISTED, LAPAROSCOPIC
Anesthesia: General | Site: Abdomen

## 2021-01-23 MED ORDER — ACETAMINOPHEN 325 MG PO TABS: 650.0000 mg | ORAL_TABLET | Freq: Three times a day (TID) | ORAL | 0 refills | Status: DC | PRN

## 2021-01-23 MED ORDER — DEXAMETHASONE SODIUM PHOSPHATE 10 MG/ML IJ SOLN
INTRAMUSCULAR | Status: DC | PRN
  Administered 2021-01-23: 10 mg via INTRAVENOUS

## 2021-01-23 MED ORDER — SUGAMMADEX SODIUM 500 MG/5ML IV SOLN
INTRAVENOUS | Status: DC | PRN
  Administered 2021-01-23: 500 mg via INTRAVENOUS

## 2021-01-23 MED ORDER — INDOCYANINE GREEN 25 MG IV SOLR
1.2500 mg | Freq: Once | INTRAVENOUS | Status: DC
  Filled 2021-01-23: qty 10

## 2021-01-23 MED ORDER — LIDOCAINE HCL (PF) 2 % IJ SOLN
INTRAMUSCULAR | Status: AC
  Filled 2021-01-23: qty 5

## 2021-01-23 MED ORDER — LACTATED RINGERS IV SOLN: INTRAVENOUS | Status: DC

## 2021-01-23 MED ORDER — PHENYLEPHRINE HCL-NACL 20-0.9 MG/250ML-% IV SOLN
INTRAVENOUS | Status: AC
  Filled 2021-01-23: qty 250

## 2021-01-23 MED ORDER — CEFAZOLIN SODIUM-DEXTROSE 2-4 GM/100ML-% IV SOLN
INTRAVENOUS | Status: AC
  Filled 2021-01-23: qty 100

## 2021-01-23 MED ORDER — ONDANSETRON HCL 4 MG/2ML IJ SOLN: 4.0000 mg | Freq: Once | INTRAMUSCULAR | Status: DC | PRN

## 2021-01-23 MED ORDER — HYDROCODONE-ACETAMINOPHEN 5-325 MG PO TABS
1.0000 | ORAL_TABLET | Freq: Four times a day (QID) | ORAL | 0 refills | Status: DC | PRN
Start: 2021-01-23 — End: 2021-02-03

## 2021-01-23 MED ORDER — CEFAZOLIN SODIUM-DEXTROSE 2-4 GM/100ML-% IV SOLN
2.0000 g | INTRAVENOUS | Status: AC
  Administered 2021-01-23: 08:00:00 3 g via INTRAVENOUS

## 2021-01-23 MED ORDER — KETOROLAC TROMETHAMINE 30 MG/ML IJ SOLN
INTRAMUSCULAR | Status: DC | PRN
  Administered 2021-01-23: 30 mg via INTRAVENOUS

## 2021-01-23 MED ORDER — MIDAZOLAM HCL 2 MG/2ML IJ SOLN
INTRAMUSCULAR | Status: DC | PRN
  Administered 2021-01-23: 2 mg via INTRAVENOUS

## 2021-01-23 MED ORDER — LIDOCAINE HCL (CARDIAC) PF 100 MG/5ML IV SOSY
PREFILLED_SYRINGE | INTRAVENOUS | Status: DC | PRN
  Administered 2021-01-23: 100 mg via INTRAVENOUS

## 2021-01-23 MED ORDER — GLYCOPYRROLATE 0.2 MG/ML IJ SOLN
INTRAMUSCULAR | Status: AC
  Filled 2021-01-23: qty 1

## 2021-01-23 MED ORDER — FENTANYL CITRATE (PF) 100 MCG/2ML IJ SOLN
INTRAMUSCULAR | Status: DC | PRN
  Administered 2021-01-23: 100 ug via INTRAVENOUS
  Administered 2021-01-23 (×2): 50 ug via INTRAVENOUS

## 2021-01-23 MED ORDER — OXYCODONE HCL 5 MG/5ML PO SOLN: 5.0000 mg | Freq: Once | ORAL | Status: AC | PRN

## 2021-01-23 MED ORDER — LIDOCAINE-EPINEPHRINE (PF) 1 %-1:200000 IJ SOLN
INTRAMUSCULAR | Status: AC
  Filled 2021-01-23: qty 30

## 2021-01-23 MED ORDER — FAMOTIDINE 20 MG PO TABS
20.0000 mg | ORAL_TABLET | Freq: Once | ORAL | Status: AC
  Administered 2021-01-23: 07:00:00 20 mg via ORAL

## 2021-01-23 MED ORDER — ONDANSETRON HCL 4 MG/2ML IJ SOLN
INTRAMUSCULAR | Status: AC
  Filled 2021-01-23: qty 2

## 2021-01-23 MED ORDER — ACETAMINOPHEN 10 MG/ML IV SOLN: 1000.0000 mg | Freq: Once | INTRAVENOUS | Status: DC | PRN

## 2021-01-23 MED ORDER — KETOROLAC TROMETHAMINE 30 MG/ML IJ SOLN
INTRAMUSCULAR | Status: AC
  Filled 2021-01-23: qty 1

## 2021-01-23 MED ORDER — ORAL CARE MOUTH RINSE: 15.0000 mL | Freq: Once | OROMUCOSAL | Status: DC

## 2021-01-23 MED ORDER — CHLORHEXIDINE GLUCONATE 0.12 % MT SOLN
OROMUCOSAL | Status: AC
  Filled 2021-01-23: qty 15

## 2021-01-23 MED ORDER — FENTANYL CITRATE (PF) 100 MCG/2ML IJ SOLN
INTRAMUSCULAR | Status: AC
  Filled 2021-01-23: qty 2

## 2021-01-23 MED ORDER — ONDANSETRON HCL 4 MG/2ML IJ SOLN
INTRAMUSCULAR | Status: DC | PRN
  Administered 2021-01-23: 4 mg via INTRAVENOUS

## 2021-01-23 MED ORDER — DEXMEDETOMIDINE (PRECEDEX) IN NS 20 MCG/5ML (4 MCG/ML) IV SYRINGE
PREFILLED_SYRINGE | INTRAVENOUS | Status: DC | PRN
  Administered 2021-01-23 (×3): 4 ug via INTRAVENOUS
  Administered 2021-01-23: 8 ug via INTRAVENOUS

## 2021-01-23 MED ORDER — ACETAMINOPHEN 10 MG/ML IV SOLN
INTRAVENOUS | Status: DC | PRN
  Administered 2021-01-23: 1000 mg via INTRAVENOUS

## 2021-01-23 MED ORDER — PROPOFOL 10 MG/ML IV BOLUS
INTRAVENOUS | Status: AC
  Filled 2021-01-23: qty 40

## 2021-01-23 MED ORDER — ROCURONIUM BROMIDE 10 MG/ML (PF) SYRINGE
PREFILLED_SYRINGE | INTRAVENOUS | Status: AC
  Filled 2021-01-23: qty 10

## 2021-01-23 MED ORDER — LIDOCAINE-EPINEPHRINE (PF) 1 %-1:200000 IJ SOLN
INTRAMUSCULAR | Status: DC | PRN
  Administered 2021-01-23: 25 mL via INTRAMUSCULAR

## 2021-01-23 MED ORDER — DEXAMETHASONE SODIUM PHOSPHATE 10 MG/ML IJ SOLN
INTRAMUSCULAR | Status: AC
  Filled 2021-01-23: qty 1

## 2021-01-23 MED ORDER — MIDAZOLAM HCL 2 MG/2ML IJ SOLN
INTRAMUSCULAR | Status: AC
  Filled 2021-01-23: qty 2

## 2021-01-23 MED ORDER — OXYCODONE HCL 5 MG PO TABS: 5.0000 mg | ORAL_TABLET | Freq: Once | ORAL | Status: AC | PRN

## 2021-01-23 MED ORDER — CEFAZOLIN SODIUM-DEXTROSE 1-4 GM/50ML-% IV SOLN
INTRAVENOUS | Status: AC
  Filled 2021-01-23: qty 50

## 2021-01-23 MED ORDER — "VISTASEAL 4 ML SINGLE DOSE KIT "
PACK | CUTANEOUS | Status: DC | PRN
  Administered 2021-01-23: 4 mL via TOPICAL

## 2021-01-23 MED ORDER — CHLORHEXIDINE GLUCONATE 0.12 % MT SOLN: 15.0000 mL | Freq: Once | OROMUCOSAL | Status: DC

## 2021-01-23 MED ORDER — IBUPROFEN 800 MG PO TABS: 800.0000 mg | ORAL_TABLET | Freq: Three times a day (TID) | ORAL | 0 refills | Status: DC | PRN

## 2021-01-23 MED ORDER — ROCURONIUM BROMIDE 100 MG/10ML IV SOLN
INTRAVENOUS | Status: DC | PRN
  Administered 2021-01-23 (×3): 20 mg via INTRAVENOUS
  Administered 2021-01-23: 60 mg via INTRAVENOUS

## 2021-01-23 MED ORDER — PROPOFOL 10 MG/ML IV BOLUS
INTRAVENOUS | Status: DC | PRN
  Administered 2021-01-23: 200 mg via INTRAVENOUS

## 2021-01-23 MED ORDER — FENTANYL CITRATE (PF) 100 MCG/2ML IJ SOLN
25.0000 ug | INTRAMUSCULAR | Status: DC | PRN
  Administered 2021-01-23: 10:00:00 25 ug via INTRAVENOUS

## 2021-01-23 MED ORDER — OXYCODONE HCL 5 MG PO TABS
ORAL_TABLET | ORAL | Status: AC
  Administered 2021-01-23: 10:00:00 5 mg via ORAL
  Filled 2021-01-23: qty 1

## 2021-01-23 MED ORDER — BUPIVACAINE HCL (PF) 0.5 % IJ SOLN
INTRAMUSCULAR | Status: AC
  Filled 2021-01-23: qty 30

## 2021-01-23 MED ORDER — DOCUSATE SODIUM 100 MG PO CAPS: 100.0000 mg | ORAL_CAPSULE | Freq: Two times a day (BID) | ORAL | 0 refills | Status: AC | PRN

## 2021-01-23 MED ORDER — CHLORHEXIDINE GLUCONATE CLOTH 2 % EX PADS: 6.0000 | MEDICATED_PAD | Freq: Once | CUTANEOUS | Status: DC

## 2021-01-23 MED ORDER — FAMOTIDINE 20 MG PO TABS
ORAL_TABLET | ORAL | Status: AC
  Filled 2021-01-23: qty 1

## 2021-01-23 MED ORDER — ACETAMINOPHEN 10 MG/ML IV SOLN
INTRAVENOUS | Status: AC
  Filled 2021-01-23: qty 100

## 2021-01-23 SURGICAL SUPPLY — 63 items
ADH SKN CLS APL DERMABOND .7 (GAUZE/BANDAGES/DRESSINGS) ×2
ANCHOR TIS RET SYS 235ML (MISCELLANEOUS) ×2 IMPLANT
APL LAPSCP 35 DL APL RGD (MISCELLANEOUS) ×2
APL PRP STRL LF DISP 70% ISPRP (MISCELLANEOUS) ×2
APPLICATOR VISTASEAL 35 (MISCELLANEOUS) ×2 IMPLANT
BAG INFUSER PRESSURE 100CC (MISCELLANEOUS) ×2 IMPLANT
BAG TISS RTRVL C235 10X14 (MISCELLANEOUS)
BLADE SURG SZ11 CARB STEEL (BLADE) ×4 IMPLANT
CANNULA REDUC XI 12-8 STAPL (CANNULA) ×1
CANNULA REDUC XI 12-8MM STAPL (CANNULA) ×1
CANNULA REDUCER 12-8 DVNC XI (CANNULA) ×2 IMPLANT
CATH REDDICK CHOLANGI 4FR 50CM (CATHETERS) IMPLANT
CHLORAPREP W/TINT 26 (MISCELLANEOUS) ×4 IMPLANT
CLIP LIGATING HEMO O LOK GREEN (MISCELLANEOUS) ×6 IMPLANT
COVER TIP SHEARS 8 DVNC (MISCELLANEOUS) IMPLANT
COVER TIP SHEARS 8MM DA VINCI (MISCELLANEOUS)
DECANTER SPIKE VIAL GLASS SM (MISCELLANEOUS) ×8 IMPLANT
DEFOGGER SCOPE WARMER CLEARIFY (MISCELLANEOUS) ×4 IMPLANT
DERMABOND ADVANCED (GAUZE/BANDAGES/DRESSINGS) ×2
DERMABOND ADVANCED .7 DNX12 (GAUZE/BANDAGES/DRESSINGS) ×2 IMPLANT
DRAPE ARM DVNC X/XI (DISPOSABLE) ×8 IMPLANT
DRAPE C-ARM XRAY 36X54 (DRAPES) IMPLANT
DRAPE COLUMN DVNC XI (DISPOSABLE) ×2 IMPLANT
DRAPE DA VINCI XI ARM (DISPOSABLE) ×8
DRAPE DA VINCI XI COLUMN (DISPOSABLE) ×2
ELECT CAUTERY BLADE 6.4 (BLADE) ×4 IMPLANT
ELECT REM PT RETURN 9FT ADLT (ELECTROSURGICAL) ×4
ELECTRODE REM PT RTRN 9FT ADLT (ELECTROSURGICAL) ×2 IMPLANT
GAUZE 4X4 16PLY ~~LOC~~+RFID DBL (SPONGE) ×4 IMPLANT
GLOVE SURG SYN 6.5 ES PF (GLOVE) ×12 IMPLANT
GLOVE SURG SYN 6.5 PF PI (GLOVE) ×4 IMPLANT
GLOVE SURG UNDER POLY LF SZ7 (GLOVE) ×10 IMPLANT
GOWN STRL REUS W/ TWL LRG LVL3 (GOWN DISPOSABLE) ×6 IMPLANT
GOWN STRL REUS W/TWL LRG LVL3 (GOWN DISPOSABLE) ×12
GRASPER SUT TROCAR 14GX15 (MISCELLANEOUS) IMPLANT
IRRIGATOR SUCT 8 DISP DVNC XI (IRRIGATION / IRRIGATOR) IMPLANT
IRRIGATOR SUCTION 8MM XI DISP (IRRIGATION / IRRIGATOR) ×2
IV NS 1000ML (IV SOLUTION) ×4
IV NS 1000ML BAXH (IV SOLUTION) IMPLANT
LABEL OR SOLS (LABEL) ×4 IMPLANT
MANIFOLD NEPTUNE II (INSTRUMENTS) ×4 IMPLANT
NDL INSUFFLATION 14GA 120MM (NEEDLE) ×2 IMPLANT
NEEDLE HYPO 22GX1.5 SAFETY (NEEDLE) ×4 IMPLANT
NEEDLE INSUFFLATION 14GA 120MM (NEEDLE) ×4 IMPLANT
NS IRRIG 500ML POUR BTL (IV SOLUTION) ×4 IMPLANT
OBTURATOR OPTICAL STANDARD 8MM (TROCAR) ×2
OBTURATOR OPTICAL STND 8 DVNC (TROCAR) ×2
OBTURATOR OPTICALSTD 8 DVNC (TROCAR) ×2 IMPLANT
PACK LAP CHOLECYSTECTOMY (MISCELLANEOUS) ×4 IMPLANT
PENCIL ELECTRO HAND CTR (MISCELLANEOUS) ×4 IMPLANT
SEAL CANN UNIV 5-8 DVNC XI (MISCELLANEOUS) ×6 IMPLANT
SEAL XI 5MM-8MM UNIVERSAL (MISCELLANEOUS) ×6
SET TUBE SMOKE EVAC HIGH FLOW (TUBING) ×4 IMPLANT
SOLUTION ELECTROLUBE (MISCELLANEOUS) ×4 IMPLANT
STAPLER CANNULA SEAL DVNC XI (STAPLE) ×2 IMPLANT
STAPLER CANNULA SEAL XI (STAPLE) ×2
SUT MNCRL 4-0 (SUTURE) ×12
SUT MNCRL 4-0 27XMFL (SUTURE) ×6
SUT VICRYL 0 AB UR-6 (SUTURE) ×4 IMPLANT
SUTURE MNCRL 4-0 27XMF (SUTURE) ×4 IMPLANT
SYR 30ML LL (SYRINGE) IMPLANT
SYSTEM WECK SHIELD CLOSURE (TROCAR) ×2 IMPLANT
WATER STERILE IRR 500ML POUR (IV SOLUTION) ×2 IMPLANT

## 2021-01-23 NOTE — Anesthesia Postprocedure Evaluation (Signed)
Anesthesia Post Note  Patient: Curtis [name]  Procedure(s) Performed: XI ROBOTIC ASSISTED LAPAROSCOPIC CHOLECYSTECTOMY (Abdomen) INDOCYANINE GREEN FLUORESCENCE IMAGING (ICG)  Patient location during evaluation: PACU Anesthesia Type: General Level of consciousness: awake and alert Pain management: pain level controlled Vital Signs Assessment: post-procedure vital signs reviewed and stable Respiratory status: spontaneous breathing, nonlabored ventilation, respiratory function stable and patient connected to nasal cannula oxygen Cardiovascular status: blood pressure returned to baseline and stable Postop Assessment: no apparent nausea or vomiting Anesthetic complications: no   No notable events documented.   Last Vitals:  Vitals:   01/23/21 0916 01/23/21 0930  BP: 139/85 131/80  Pulse:  69  Resp: 16 15  Temp: (!) 36.2 C   SpO2: 95% 94%    Last Pain:  Vitals:   01/23/21 0916  TempSrc:   PainSc: Asleep                 Corinda Gubler

## 2021-01-23 NOTE — Discharge Instructions (Addendum)
Laparoscopic Cholecystectomy, Care After This sheet gives you information about how to care for yourself after your procedure. Your doctor may also give you more specific instructions. If you have problems or questions, contact your doctor. Follow these instructions at home: Care for cuts from surgery (incisions)  Follow instructions from your doctor about how to take care of your cuts from surgery. Make sure you: Wash your hands with soap and water before you change your bandage (dressing). If you cannot use soap and water, use hand sanitizer. Change your bandage as told by your doctor. Leave stitches (sutures), skin glue, or skin tape (adhesive) strips in place. They may need to stay in place for 2 weeks or longer. If tape strips get loose and curl up, you may trim the loose edges. Do not remove tape strips completely unless your doctor says it is okay. Do not take baths, swim, or use a hot tub until your doctor says it is okay. OK TO SHOWER 24HRS AFTER YOUR SURGERY.  Check your surgical cut area every day for signs of infection. Check for: More redness, swelling, or pain. More fluid or blood. Warmth. Pus or a bad smell. Activity Do not drive or use heavy machinery while taking prescription pain medicine. Do not play contact sports until your doctor says it is okay. Do not drive for 24 hours if you were given a medicine to help you relax (sedative). Rest as needed. Do not return to work or school until your doctor says it is okay. General instructions  tylenol and advil as needed for discomfort.  Please alternate between the two every four hours as needed for pain.    Use narcotics, if prescribed, only when tylenol and motrin is not enough to control pain.  325-650mg every 8hrs to max of 3000mg/24hrs (including the 325mg in every norco dose) for the tylenol.    Advil up to 800mg per dose every 8hrs as needed for pain.   To prevent or treat constipation while you are taking prescription  pain medicine, your doctor may recommend that you: Drink enough fluid to keep your pee (urine) clear or pale yellow. Take over-the-counter or prescription medicines. Eat foods that are high in fiber, such as fresh fruits and vegetables, whole grains, and beans. Limit foods that are high in fat and processed sugars, such as fried and sweet foods. Contact a doctor if: You develop a rash. You have more redness, swelling, or pain around your surgical cuts. You have more fluid or blood coming from your surgical cuts. Your surgical cuts feel warm to the touch. You have pus or a bad smell coming from your surgical cuts. You have a fever. One or more of your surgical cuts breaks open. You have trouble breathing. You have chest pain. You have pain that is getting worse in your shoulders. You faint or feel dizzy when you stand. You have very bad pain in your belly (abdomen). You are sick to your stomach (nauseous) for more than one day. You have throwing up (vomiting) that lasts for more than one day. You have leg pain. This information is not intended to replace advice given to you by your health care provider. Make sure you discuss any questions you have with your health care provider. Document Released: 10/22/2007 Document Revised: 08/03/2015 Document Reviewed: 07/01/2015 Elsevier Interactive Patient Education  2019 [employer]   AMBULATORY SURGERY  DISCHARGE INSTRUCTIONS   The drugs that you were given will stay in your system until tomorrow so   for the next 24 hours you should not:  Drive an automobile Make any legal decisions Drink any alcoholic beverage   You may resume regular meals tomorrow.  Today it is better to start with liquids and gradually work up to solid foods.  You may eat anything you prefer, but it is better to start with liquids, then soup and crackers, and gradually work up to solid foods.   Please notify your doctor immediately if you have any unusual  bleeding, trouble breathing, redness and pain at the surgery site, drainage, fever, or pain not relieved by medication.    Additional Instructions: 

## 2021-01-23 NOTE — Anesthesia Preprocedure Evaluation (Signed)
Anesthesia Evaluation  Patient identified by MRN, date of birth, ID band Patient awake    Reviewed: Allergy & Precautions, NPO status , Patient's Chart, lab work & pertinent test results  History of Anesthesia Complications Negative for: history of anesthetic complications  Airway Mallampati: II  TM Distance: >3 FB Neck ROM: Full    Dental no notable dental hx. (+) Teeth Intact   Pulmonary neg pulmonary ROS, neg sleep apnea, neg COPD, Patient abstained from smoking.Not current smoker, former smoker,    Pulmonary exam normal breath sounds clear to auscultation       Cardiovascular Exercise Tolerance: Good METS(-) hypertension(-) CAD and (-) Past MI (-) dysrhythmias + Valvular Problems/Murmurs  Rhythm:Regular Rate:Normal - Systolic murmurs    Neuro/Psych PSYCHIATRIC DISORDERS Anxiety CVA 08/2020 (vertebral artery dissection) 2/2 chiropractic manipulation. No interventions required. Residual vision problems. CVA, Residual Symptoms    GI/Hepatic neg GERD  ,(+)     (-) substance abuse  ,   Endo/Other  neg diabetesMorbid obesity  Renal/GU negative Renal ROS     Musculoskeletal   Abdominal (+) + obese,   Peds  Hematology   Anesthesia Other Findings Past Medical History: 09/09/2020: Acute cerebrovascular accident (CVA) of medulla oblongata  ([facility])     Comment:  a.) small acute infarct of the lateral LEFT medulla in               setting of LEFT vertebral artery dissection. No date: Anxiety 09/14/2020: ASD (atrial septal defect)     Comment:  a.) TTE 09/14/2020: EF 65-70%; (+) IAS noted on color               flow Doppler. No date: Heart murmur 09/09/2020: LEFT vertebral artery dissection (HCC)  Reproductive/Obstetrics                             Anesthesia Physical Anesthesia Plan  ASA: 3  Anesthesia Plan: General   Post-op Pain Management: Toradol IV (intra-op), Ofirmev IV (intra-op)  and Gabapentin PO (pre-op)   Induction: Intravenous  PONV Risk Score and Plan: 4 or greater and Ondansetron, Dexamethasone and Midazolam  Airway Management Planned: Oral ETT  Additional Equipment: None  Intra-op Plan:   Post-operative Plan: Extubation in OR  Informed Consent: I have reviewed the patients History and Physical, chart, labs and discussed the procedure including the risks, benefits and alternatives for the proposed anesthesia with the patient or authorized representative who has indicated his/her understanding and acceptance.     Dental advisory given  Plan Discussed with: CRNA and Surgeon  Anesthesia Plan Comments: (Discussed risks of anesthesia with patient, including PONV, sore throat, lip/dental/eye damage. Rare risks discussed as well, such as cardiorespiratory and neurological sequelae, and allergic reactions. Discussed the role of CRNA in patient's perioperative care. Patient understands. Patient informed about increased incidence of above perioperative risk due to high BMI. Patient understands. )        Anesthesia Quick Evaluation

## 2021-01-23 NOTE — Op Note (Signed)
Preoperative diagnosis:  acute and cholecystitis  Postoperative diagnosis: same as above  Procedure: Robotic assisted Laparoscopic Cholecystectomy.   Anesthesia: GETA   Surgeon: Sung Amabile  Specimen: Gallbladder  Complications: None  EBL: 19mL  Wound Classification: Clean Contaminated  Indications: see HPI  Findings: Critical view of safety noted Cystic duct and artery identified, ligated and divided, clips remained intact at end of procedure Adequate hemostasis  Description of procedure:  The patient was placed on the operating table in the supine position. SCDs placed, pre-op abx administered.  General anesthesia was induced and OG tube placed by anesthesia. A time-out was completed verifying correct patient, procedure, site, positioning, and implant(s) and/or special equipment prior to beginning this procedure. The abdomen was prepped and draped in the usual sterile fashion.    Veress needle was placed at the Palmer's point and insufflation was started after confirming a positive saline drop test and no immediate increase in abdominal pressure.  After reaching 15 mm, the Veress needle was removed and a 8 mm port was placed via optiview technique under umbilicus measured 90mm from gallbladder.  The abdomen was inspected and no abnormalities or injuries were found.  Under direct vision, ports were placed in the following locations: One 12 mm patient left of the umbilicus, 8cm from the optiviewed port, one 8 mm port placed to the patient right of the umbilical port 8 cm apart.  1 additional 8 mm port placed lateral to the 26mm port.  Once ports were placed, The table was placed in the reverse Trendelenburg position with the right side up. The Xi platform was brought into the operative field and docked to the ports successfully.  An endoscope was placed through the umbilical port, fenestrated grasper through the adjacent patient right port, prograsp to the far patient left port, and then  a hook cautery in the left port.  The dome of the gallbladder was grasped with prograsp, passed and retracted over the dome of the liver. During this maneuver, a portion of the liver capsule attached to falciform tore away and bleeding was noted in the area.  This was controlled with manual pressure, cautery, and vistal seal.  The gallbladder was retracted awa again and adhesions between the gallbladder and omentum, duodenum and transverse colon were lysed via hook cautery. The infundibulum was grasped with the fenestrated grasper and retracted toward the right lower quadrant. This maneuver exposed Calots triangle. The peritoneum overlying the gallbladder infundibulum was then dissected  and the cystic duct and cystic artery identified.  Critical view of safety with the liver bed clearly visible behind the duct and artery with no additional structures noted.  The cystic duct and cystic artery clipped and divided close to the gallbladder.     The gallbladder was then dissected from its peritoneal and liver bed attachments by electrocautery. Hemostasis was checked prior to removing the hook cautery and the Endo Catch bag was then placed through the 12 mm port and the gallbladder was removed.  The gallbladder was passed off the table as a specimen. There was no evidence of bleeding from the gallbladder fossa or cystic artery or leakage of the bile from the cystic duct stump. The 12 mm port site closed with PMI using 0 vicryl under direct vision.  Abdomen desufflated and secondary trocars were removed under direct vision. No bleeding was noted. All skin incisions then closed with subcuticular sutures of 4-0 monocryl and dressed with topical skin adhesive. The orogastric tube was removed and patient extubated.  The patient tolerated the procedure well and was taken to the postanesthesia care unit in stable condition.  All sponge and instrument count correct at end of procedure.

## 2021-01-23 NOTE — Transfer of Care (Signed)
Immediate Anesthesia Transfer of Care Note  Patient: Curtis [name]  Procedure(s) Performed: XI ROBOTIC ASSISTED LAPAROSCOPIC CHOLECYSTECTOMY (Abdomen) INDOCYANINE GREEN FLUORESCENCE IMAGING (ICG)  Patient Location: PACU  Anesthesia Type:General  Level of Consciousness: drowsy  Airway & Oxygen Therapy: Patient Spontanous Breathing and Patient connected to face mask oxygen  Post-op Assessment: Report given to RN and Post -op Vital signs reviewed and stable  Post vital signs: Reviewed and stable  Last Vitals:  Vitals Value Taken Time  BP 139/85 01/23/21 0916  Temp    Pulse 75 01/23/21 0916  Resp 15 01/23/21 0916  SpO2 93 % 01/23/21 0916  Vitals shown include unvalidated device data.  Last Pain:  Vitals:   01/23/21 0648  TempSrc: Tympanic  PainSc: 2          Complications: No notable events documented.

## 2021-01-23 NOTE — Interval H&P Note (Signed)
History and Physical Interval Note:  01/23/2021 7:14 AM  Curtis [name]  has presented today for surgery, with the diagnosis of K80.50 biliary colic.  The various methods of treatment have been discussed with the patient and family. After consideration of risks, benefits and other options for treatment, the patient has consented to  Procedure(s): XI ROBOTIC ASSISTED LAPAROSCOPIC CHOLECYSTECTOMY (N/A) INDOCYANINE GREEN FLUORESCENCE IMAGING (ICG) (N/A) as a surgical intervention.  The patient's history has been reviewed, patient examined, no change in status, stable for surgery.  I have reviewed the patient's chart and labs.  Questions were answered to the patient's satisfaction.     Seylah Wernert Tonna Boehringer

## 2021-01-23 NOTE — Anesthesia Procedure Notes (Signed)
Procedure Name: Intubation Date/Time: 01/23/2021 7:37 AM Performed by: Joanette Gula, Leva Baine, CRNA Pre-anesthesia Checklist: Patient identified, Emergency Drugs available, Suction available and Patient being monitored Patient Re-evaluated:Patient Re-evaluated prior to induction Oxygen Delivery Method: Circle system utilized Preoxygenation: Pre-oxygenation with 100% oxygen Induction Type: IV induction Ventilation: Two handed mask ventilation required Laryngoscope Size: McGraph and 4 Grade View: Grade I Tube type: Oral Tube size: 7.5 mm Number of attempts: 1 Airway Equipment and Method: Stylet Placement Confirmation: ETT inserted through vocal cords under direct vision, positive ETCO2 and breath sounds checked- equal and bilateral Secured at: 23 cm Tube secured with: Tape Dental Injury: Teeth and Oropharynx as per pre-operative assessment

## 2021-01-24 LAB — SURGICAL PATHOLOGY

## 2021-01-28 ENCOUNTER — Ambulatory Visit: Payer: BC Managed Care – PPO

## 2021-01-30 ENCOUNTER — Ambulatory Visit: Payer: BC Managed Care – PPO

## 2021-02-03 ENCOUNTER — Ambulatory Visit (INDEPENDENT_AMBULATORY_CARE_PROVIDER_SITE_OTHER): Payer: BC Managed Care – PPO | Admitting: Adult Health

## 2021-02-03 ENCOUNTER — Encounter: Payer: Self-pay | Admitting: Adult Health

## 2021-02-03 ENCOUNTER — Other Ambulatory Visit: Payer: Self-pay

## 2021-02-03 VITALS — BP 143/94 | HR 75 | Ht 72.0 in | Wt 317.0 lb

## 2021-02-03 DIAGNOSIS — I7774 Dissection of vertebral artery: Secondary | ICD-10-CM | POA: Diagnosis not present

## 2021-02-03 DIAGNOSIS — H518 Other specified disorders of binocular movement: Secondary | ICD-10-CM | POA: Diagnosis not present

## 2021-02-03 DIAGNOSIS — I639 Cerebral infarction, unspecified: Secondary | ICD-10-CM

## 2021-02-03 MED ORDER — ATORVASTATIN CALCIUM 20 MG PO TABS: 20.0000 mg | ORAL_TABLET | Freq: Every day | ORAL | 5 refills | Status: DC

## 2021-02-03 NOTE — Patient Instructions (Signed)
Restart aspirin 81 mg daily  and atorvastatin 20mg  daily for secondary stroke prevention  Continue to follow up with PCP regarding cholesterol and blood pressure management/monitoring  Maintain strict control of blood pressure with blood pressure goal below 130/90 and cholesterol with LDL cholesterol (bad cholesterol) goal below 70 mg/dL.   Signs of a Stroke? Follow the BEFAST method:  Balance Watch for a sudden loss of balance, trouble with coordination or vertigo Eyes Is there a sudden loss of vision in one or both eyes? Or double vision?  Face: Ask the person to smile. Does one side of the face droop or is it numb?  Arms: Ask the person to raise both arms. Does one arm drift downward? Is there weakness or numbness of a leg? Speech: Ask the person to repeat a simple phrase. Does the speech sound slurred/strange? Is the person confused ? Time: If you observe any of these signs, call 911.     Followup in the future with me in 6 months or call earlier if needed      Thank you for coming to see at [facility]. I hope we have been able to provide you high quality care today.  You may receive a patient satisfaction survey over the next few weeks. We would appreciate your feedback and comments so that we may continue to improve ourselves and the health of our patients.

## 2021-02-03 NOTE — Progress Notes (Signed)
[facility] [address] [place]. [place] [address] [phone]       STROKE FOLLOW UP NOTE  Curtis [name] Date of Birth:  10/15/1985 Medical Record Number:  [id number]   Reason for Referral:  stroke follow up    SUBJECTIVE:   CHIEF COMPLAINT:  Chief Complaint  Patient presents with   Follow-up    RM 3alone Pt is well and stable, no new concerns      HPI:   Update 02/03/2021 JM: Returns for 97-month stroke follow-up unaccompanied. Overall stable. Denies new stroke/TIA symptoms. Residual visual difficulties: worse vision when looking side to side (L>R), poor focus after focusing for too long, intermittent diplopia at end of day or watching something for too long - has appt with Dr. Karleen Hampshire neuro-ophthalmology this afternoon. Residual RUE altered sensory stable without much improvement and occasional right hand stiffness sensation - improving.  Residual imbalance but gradually improving. Completed PT 12/21. Returned back to work 1/3 and previously returned back to driving - does fatigue quick but his work has been allowing him to slowly return back.  Completed 3 months DAPT - as on aspirin but held with recent gallbladder removal procedure and not yet restarted. Stopped atorvastatin as he believes was only needed for 3 mo duration - no side effects while on. Recent lipid panel 11/2020 showed LDL 103.  Blood pressure today 143/94 - routinely monitors at home and typically 120s/80s. No further concerns at this time.      History provided for reference purposes only Update 10/17/2020 JM: Curtis [name] is being seen for hospital follow-up accompanied by his wife.  He has been doing well since discharge.  Reports continued improvement of residual diplopia, RUE sensory deficit and gait impairment.  Currently working with therapies.  Continued use of eye patch over left eye.  Occasional use of RW but today was able to ambulate with AD but was assisted by his wife.   Reports RUE sensory improving and starting to experience a tingling sensation.  He continues to experience left-sided neck pain and left-sided headache (headache present with increased neck pain). C/o neck pain PTA with wife concerned regarding worsening after his stroke.  He is currently being followed by Curtis [name], Curtis [name].  Feels like neck pain is worse in the morning upon awakening and in the evening.  He remains on short-term disability.  Denies new stroke/TIA symptoms.  Remains on aspirin and Plavix with mild bruising but no bleeding.  Remains on atorvastatin 40 mg daily without side effects.  Blood pressure today 128/88.  No further concerns at this time.  Stroke admission 09/09/2020 Curtis [name] is a 36 year old male w/pmh of DJD, neck pain and headache who presented to [facility] on 09/09/2020 with dizziness, vertigo, nausea and leaning towards the left.  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 He was discharged to [facility] on 09/13/2020.  Neurology consulted at [facility] due to recent stroke.  Evaluated by Dr. Roda Shutters who recommended continuation of aspirin and Plavix for 3 months and follow-up with repeat CT head/neck 2-3 months.  EF 65 to 70% however ASD by color Doppler flow likely to be asymptomatic incidental finding and not associated with current stroke which was felt most likely due to VA dissection from neck manipulation.  He was advised to follow-up with cardiology outpatient for further management.  A1c 5.4.  LDL 67. Recommended continued participation with PT/OT at CIR with  residual diplopia, left-sided numbness and headache.      PERTINENT IMAGING  CT HEAD 09/09/2020 IMPRESSION: CT head: 1. No evidence of acute intracranial abnormality. 2. Paranasal sinus disease, as described. Correlate for acute sinusitis.    CT ANGIO HEAD NECK  IMPRESSION 12/27/2020 1. Essentially normalized appearance of the left vertebral  artery. No evidence of baseline vasculopathy. 2. Extensive sinus opacification and bronchial airway thickening.  IMPRESSION: 09/09/2020 1. Irregularity of the V2 segment of the left vertebral artery with sites of up to mild/moderate stenosis. Irregularity of the V4 left vertebral artery with sites of up to moderate/severe stenosis. These findings are highly suspicious for left vertebral artery dissection given the provided history. A brain MRI is recommended to assess for acute intracranial ischemia. 2. Elsewhere within the head and neck, there is no large vessel occlusion or proximal high-grade arterial stenosis. 3. Aberrant right subclavian artery. The right subclavian artery origin is incompletely included in the field of view.  IMPRESSION 09/11/2020 IMPRESSION: No acute intracranial abnormality. Small infarct of medulla is better seen on prior MRI.   Persistent findings of left vertebral artery dissection extracranially and intracranially. Slightly increased narrowing at the level of the C2 transverse foramen. Remains up to severe stenosis intracranially.   MR BRAIN WO CONTRAST  IMPRESSION: 09/09/2020 Small acute stroke of the lateral left medulla.  IMPRESSION: 09/11/2020 More conspicuous signal abnormality in the left medulla that could possibly be evolutionary change of the previous stroke, though some extension is not excluded. Curtis [name] blood products in the region without measurable hematoma.   MR CERVICAL SPINE WO CONTRAST 09/09/2020 IMPRESSION: No abnormal cord signal.  No canal or foraminal stenosis.          ROS:   14 system review of systems performed and negative with exception of those listed in HPI  PMH:  Past Medical History:  Diagnosis Date   Acute cerebrovascular accident (CVA) of medulla oblongata (HCC) 09/09/2020   a.) small acute infarct of the lateral LEFT medulla in setting of LEFT vertebral artery dissection.   Anxiety    ASD (atrial  septal defect) 09/14/2020   a.) TTE 09/14/2020: EF 65-70%; (+) IAS noted on color flow Doppler.   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    PSH:  Past Surgical History:  Procedure Laterality Date   GALLBLADDER SURGERY  01/23/2021   removed    Social History:  Social History   Socioeconomic History   Marital status: Married    Spouse name: Not on file   Number of children: Not on file   Years of education: Not on file   Highest education level: Not on file  Occupational History   Not on file  Tobacco Use   Smoking status: Former    Types: Cigars   Smokeless tobacco: Never  Vaping Use   Vaping Use: Former  Substance and Sexual Activity   Alcohol use: Yes    Alcohol/week: 2.0 standard drinks    Types: 2 Shots of liquor per week   Drug use: Never   Sexual activity: Yes  Other Topics Concern   Not on file  Social History Narrative   Not on file   Social Determinants of Health   Financial Resource Strain: Not on file  Food Insecurity: Not on file  Transportation Needs: Not on file  Physical Activity: Not on file  Stress: Not on file  Social Connections: Not on file  Intimate Partner Violence: Not on file  Family History:  Family History  Problem Relation Age of Onset   COPD Maternal Grandmother    Arthritis Maternal Grandmother    Alcohol abuse Maternal Grandfather    Heart attack Maternal Grandfather     Medications:   Current Outpatient Medications on File Prior to Visit  Medication Sig Dispense Refill   FLUoxetine (PROZAC) 40 MG capsule Take 1 capsule (40 mg total) by mouth daily. 90 capsule 0   fluticasone (FLONASE) 50 MCG/ACT nasal spray Place 2 sprays into both nostrils daily.     No current facility-administered medications on file prior to visit.    Allergies:  No Known Allergies    OBJECTIVE:  Physical Exam  Vitals:   02/03/21 1305  BP: (!) 143/94  Pulse: 75  Weight: (!) 317 lb (143.8 kg)  Height: 6'  (1.829 m)    Body mass index is 42.99 kg/m. No results found.  General: well developed, well nourished, very pleasant young Caucasian male, seated, in no evident distress Head: head normocephalic and atraumatic.   Neck: supple with no carotid or supraclavicular bruits Cardiovascular: regular rate and rhythm, no murmurs Musculoskeletal: no deformity Skin:  no rash/petichiae Vascular:  Normal pulses all extremities   Neurologic Exam Mental Status: Awake and fully alert. Fluent speech and language. Oriented to place and time. Recent and remote memory intact. Attention span, concentration and fund of knowledge appropriate. Mood and affect appropriate.  Cranial Nerves: Pupils equal, briskly reactive to light. Extraocular movements persistent nystagmus worsening when looking towards left.  Visual fields full to confrontation. Hearing intact. Decreased facial sensory. Face, tongue, palate moves normally and symmetrically.  Motor: Normal bulk and tone. Normal strength in all tested extremity muscles  Sensory.: intact to touch , pinprick , position and vibratory sensation except RUE decreased light touch, temp. and pinprick sensation Coordination: Rapid alternating movements normal in all extremities. Finger-to-nose and heel-to-shin performed accurately bilaterally. Gait and Station: Arises from chair without difficulty. Stance is normal. Gait demonstrates normal stride length and balance without use of AD.  Tandem walk and heel toe with mild difficulty. Romberg - mild swaying towards right  Reflexes: 1+ and symmetric. Toes downgoing.        ASSESSMENT: Curtis [name] is a 35 y.o. 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 Work up completed at [facility] and was evaluated by Dr. Roda Shutters during [facility] admission.  Vascular risk factors include DJD.      PLAN:  Left lateral medullary stroke:  Residual deficit: Making gradual excellent recovery -  main issue is continued visual difficulties with occasional diplopia and eye fatigue. Continue to follow with neuro-ophthalmology and PMR Advised to restart aspirin 81 mg daily and atorvastatin for secondary stroke prevention. Recommend life long use unless contraindicated in the future for secondary stroke prevention.  Defer management/rx refills to PCP Discussed secondary stroke prevention measures and importance of close PCP follow up for aggressive stroke risk factor management. I have gone over the pathophysiology of stroke, warning signs and symptoms, risk factors and their management in some detail with instructions to go to the closest emergency room for symptoms of concern. VA dissection:  Repeat CTA head/neck 12/2020 showed essentially normalized appearance of L VA CTA head/neck 08/2020 irregularity of L V2 and V4 highly suspicious for L VA dissection avoid contact sports, chiropractic neck manipulation, and any activity that involves abrupt rotation and flexion/extension of neck and close monitoring of blood pressure to avoid hypotension-  defer to PCP Likely in setting of neck manipulation from chiropractor treatments HLD: LDL goal <70. Recent LDL 104 (11/2020).  Restart atorvastatin 20mg  daily - refill provided. Request ongoing refills by PCP. Request PCP repeat lipid panel at f/u visit in March and adjust dosage as needed     Follow up in 6 months or call earlier if needed   CC:  PCP: Larae GroomsHoldsworth, Karen, NP    I spent 34 minutes of face-to-face and non-face-to-face time with patient.  This included previsit chart review, lab review, study review, order entry, electronic health record documentation, patient education regarding prior stroke including etiology and repeat CTA review, VA dissection precautions, secondary stroke prevention measures and importance of managing stroke risk factors, residual deficits and typical recovery time, and answered all other questions to patients  satisfaction  Ihor AustinJessica McCue, AGNP-BC  [facility] [address] [place], [place] [id number]  Phone [phone] Fax [phone] Note: This document was prepared with digital dictation and possible smart phrase technology. Any transcriptional errors that result from this process are unintentional.

## 2021-02-04 ENCOUNTER — Ambulatory Visit: Payer: BC Managed Care – PPO

## 2021-02-06 ENCOUNTER — Ambulatory Visit: Payer: BC Managed Care – PPO

## 2021-02-10 ENCOUNTER — Telehealth: Payer: Self-pay

## 2021-02-10 MED ORDER — FLUOXETINE HCL 40 MG PO CAPS
40.0000 mg | ORAL_CAPSULE | Freq: Every day | ORAL | 0 refills | Status: DC
Start: 2021-02-10 — End: 2021-02-13

## 2021-02-10 NOTE — Telephone Encounter (Signed)
Refill request:    Fluoxetine

## 2021-02-11 ENCOUNTER — Ambulatory Visit: Payer: BC Managed Care – PPO

## 2021-02-13 ENCOUNTER — Other Ambulatory Visit: Payer: Self-pay

## 2021-02-13 ENCOUNTER — Encounter (HOSPITAL_BASED_OUTPATIENT_CLINIC_OR_DEPARTMENT_OTHER): Payer: Self-pay

## 2021-02-13 ENCOUNTER — Ambulatory Visit
Admission: EM | Admit: 2021-02-13 | Discharge: 2021-02-13 | Disposition: A | Discharge status: Other Acute Inpatient Hospital | Payer: BC Managed Care – PPO | Attending: Student | Admitting: Student

## 2021-02-13 ENCOUNTER — Ambulatory Visit: Payer: Self-pay | Admitting: *Deleted

## 2021-02-13 ENCOUNTER — Ambulatory Visit: Payer: BC Managed Care – PPO

## 2021-02-13 ENCOUNTER — Emergency Department (HOSPITAL_BASED_OUTPATIENT_CLINIC_OR_DEPARTMENT_OTHER)
Admission: EM | Admit: 2021-02-13 | Discharge: 2021-02-13 | Disposition: A | Discharge status: Home / Self Care | Payer: BC Managed Care – PPO | Attending: Emergency Medicine | Admitting: Emergency Medicine

## 2021-02-13 ENCOUNTER — Encounter: Payer: Self-pay | Admitting: Emergency Medicine

## 2021-02-13 DIAGNOSIS — R58 Hemorrhage, not elsewhere classified: Secondary | ICD-10-CM

## 2021-02-13 DIAGNOSIS — S61011A Laceration without foreign body of right thumb without damage to nail, initial encounter: Secondary | ICD-10-CM | POA: Diagnosis not present

## 2021-02-13 DIAGNOSIS — Z8673 Personal history of transient ischemic attack (TIA), and cerebral infarction without residual deficits: Secondary | ICD-10-CM

## 2021-02-13 DIAGNOSIS — Z7901 Long term (current) use of anticoagulants: Secondary | ICD-10-CM

## 2021-02-13 DIAGNOSIS — W268XXA Contact with other sharp object(s), not elsewhere classified, initial encounter: Secondary | ICD-10-CM | POA: Insufficient documentation

## 2021-02-13 NOTE — Telephone Encounter (Signed)
Last appointment 01/14/2021. Next appointment 07/15/2021. Pharmacy request for a 90 day supply.

## 2021-02-13 NOTE — ED Provider Notes (Signed)
Curtis [name]    CSN: [id number] Arrival date & time: 02/13/21  1325      History   Chief Complaint No chief complaint on file.   HPI Curtis [name] is a 36 y.o. male presenting with R thumb laceration sustained today. Medical history ASD, stroke, left vertebral artery dissection. Takes daily baby aspirin. States was chopping an onion for lunch when the mandolin slipped and cut a chunk of his R thumb off. Bleeding is uncontrolled. He is right handed. Denies sensation changes. He is not immunocompromised. Last Tdap approx 3 years ago per pt.   HPI  Past Medical History:  Diagnosis Date   Acute cerebrovascular accident (CVA) of medulla oblongata ([place]) 09/09/2020   a.) small acute infarct of the lateral LEFT medulla in setting of LEFT vertebral artery dissection.   Anxiety    ASD (atrial septal defect) 09/14/2020   a.) TTE 09/14/2020: EF 65-70%; (+) IAS noted on color flow Doppler.   Heart murmur    LEFT vertebral artery dissection ([place]) 09/09/2020    Patient Active Problem List   Diagnosis Date Noted   Thoracic back pain 11/01/2020   Somatic dysfunction of spine, thoracic 11/01/2020   Slow transit constipation    Morbid obesity (HCC)    Neck pain    ASD (atrial septal defect)    Ischemic cerebrovascular accident (CVA) of frontal lobe ([place]) 09/13/2020   Stroke ([place]) 09/10/2020   Lateral medullary syndrome    Vertebral artery dissection ([place]) 09/09/2020   Obesity (BMI 35.0-39.9 without comorbidity) 09/09/2020   Essential hypertension 09/09/2020   Acute stroke of medulla oblongata [facility])     Past Surgical History:  Procedure Laterality Date   GALLBLADDER SURGERY  01/23/2021   removed       Home Medications    Prior to Admission medications   Medication Sig Start Date End Date Taking? Authorizing Provider  atorvastatin (LIPITOR) 20 MG tablet Take 1 tablet (20 mg total) by mouth daily. 02/03/21   Frann Rider, NP  FLUoxetine (PROZAC) 40 MG capsule Take 1  capsule (40 mg total) by mouth daily. 02/10/21 02/10/22  Raulkar, Clide Deutscher, MD  fluticasone (FLONASE) 50 MCG/ACT nasal spray Place 2 sprays into both nostrils daily.    [provider]    Family History Family History  Problem Relation Age of Onset   COPD Maternal Grandmother    Arthritis Maternal Grandmother    Alcohol abuse Maternal Grandfather    Heart attack Maternal Grandfather     Social History Social History   Tobacco Use   Smoking status: Former    Types: Cigars   Smokeless tobacco: Never  Vaping Use   Vaping Use: Former  Substance Use Topics   Alcohol use: Yes    Alcohol/week: 2.0 standard drinks    Types: 2 Shots of liquor per week   Drug use: Never     Allergies   Patient has no known allergies.   Review of Systems Review of Systems  Skin:  Positive for wound.  All other systems reviewed and are negative.   Physical Exam Triage Vital Signs ED Triage Vitals [02/13/21 1336]  Enc Vitals Group     BP 130/84     Pulse Rate 93     Resp 18     Temp 98.1 F (36.7 C)     Temp src      SpO2 97 %     Weight      Height  Head Circumference      Peak Flow      Pain Score      Pain Loc      Pain Edu?      Excl. in [place]?    No data found.  Updated Vital Signs BP 130/84 (BP Location: Left Arm)    Pulse 93    Temp 98.1 F (36.7 C)    Resp 18    SpO2 97%   Visual Acuity Right Eye Distance:   Left Eye Distance:   Bilateral Distance:    Right Eye Near:   Left Eye Near:    Bilateral Near:     Physical Exam Vitals reviewed.  Constitutional:      General: He is not in acute distress.    Appearance: Normal appearance. He is not ill-appearing.  HENT:     Head: Normocephalic and atraumatic.  Pulmonary:     Effort: Pulmonary effort is normal.  Skin:    Capillary Refill: Capillary refill takes less than 2 seconds.     Findings: Wound present.     Comments: See images below.  R thumb with 1cm avulsion to the distal medial aspect.  Surrounding sensation intact, cap refill <2 seconds, ROM IP intact and without pain. Bleeding is uncontrolled.  Neurological:     General: No focal deficit present.     Mental Status: He is alert and oriented to person, place, and time.  Psychiatric:        Mood and Affect: Mood normal.        Behavior: Behavior normal.        Thought Content: Thought content normal.        Judgment: Judgment normal.        UC Treatments / Results  Labs (all labs ordered are listed, but only abnormal results are displayed) Labs Reviewed - No data to display  EKG   Radiology No results found.  Procedures Laceration Repair  Date/Time: 02/13/2021 2:35 PM Performed by: Hazel Sams, PA-C Authorized by: Hazel Sams, PA-C   Consent:    Consent obtained:  Verbal   Consent given by:  Patient   Risks, benefits, and alternatives were discussed: yes     Risks discussed:  Infection, need for additional repair and pain Universal protocol:    Procedure explained and questions answered to patient or proxy's satisfaction: yes     Patient identity confirmed:  Verbally with patient and arm band Anesthesia:    Anesthesia method:  Local infiltration   Local anesthetic:  Lidocaine 1% w/o epi Laceration details:    Location:  Finger   Finger location:  R thumb   Length (cm):  1 Pre-procedure details:    Preparation:  Patient was prepped and draped in usual sterile fashion Exploration:    Hemostasis obtained with: silver nitrate caustic. Treatment:    Area cleansed with:  Povidone-iodine   Irrigation solution:  Sterile saline Post-procedure details:    Dressing: pressure dressing.   Procedure completion:  Tolerated well, no immediate complications Comments:     Local infiltration of distal phalanx R thumb with lidocaine 1% without epi. Following this attempted to stop bleeding using 12 sticks of silver nitrate. Unsuccessful.  (including critical care time)  Medications Ordered in  UC Medications - No data to display  Initial Impression / Assessment and Plan / UC Course  I have reviewed the triage vital signs and the nursing notes.  Pertinent labs & imaging results that were available during  my care of the patient were reviewed by me and considered in my medical decision making (see chart for details).     This patient is a very pleasant 36 y.o. year old male presenting with R thumb - avulsion laceration. Injury sustained 2 hours ago by [facility] which he was using to cook at home. He takes a daily baby aspirin due to history stroke. Attempted to cauterize finger using silver nitrate. This was ultimately unsuccessful and he was discharged to the ED for further management. Tdap UTD 3 years ago per pt.   Final Clinical Impressions(s) / UC Diagnoses   Final diagnoses:  Bleeding from finger  Laceration of right thumb without foreign body without damage to nail, initial encounter  History of stroke  Long term current use of anticoagulant therapy     Discharge Instructions      -Head to the ED for further management. They have more tools to help stop uncontrollable bleeding.      ED Prescriptions   None    PDMP not reviewed this encounter.   Hazel Sams, PA-C 02/13/21 1439

## 2021-02-13 NOTE — Discharge Instructions (Signed)
Try to avoid typing with your thumb for the next 2 weeks.  You can take Tylenol and ibuprofen as needed at home for pain.  If the thumb begins to ooze blood again, hold firm steady pressure directly over the site of the bleeding for 10 minutes without letting go.  Following this you can submerge your finger in ice water for 2 minutes.  Then continue to hold pressure.  If you still cannot control the bleeding, come back to the ER.

## 2021-02-13 NOTE — ED Provider Notes (Signed)
[facility] EMERGENCY DEPT Provider Note   CSN: [id number] Arrival date & time: 02/13/21  1511     History  Chief Complaint  Patient presents with   Finger Injury    Curtis [name] is a 36 y.o. male presented emerged part with an injury to his right thumb.  The patient accidentally cut the tip of his right thumb with a mandolin slicer.  He is right-handed.  He works with computers for living.  He went to an urgent care, where he had a localized block performed, and then had a cauterization of the wound.  There was concern that the wound was still bleeding after cauterization he was referred to the ED.  The wound was tightly wrapped.  He reports his tetanus is up-to-date.  No fevers or chills.  HPI     Home Medications Prior to Admission medications   Medication Sig Start Date End Date Taking? Authorizing Provider  atorvastatin (LIPITOR) 20 MG tablet Take 1 tablet (20 mg total) by mouth daily. 02/03/21   Ihor Austin, NP  FLUoxetine (PROZAC) 40 MG capsule Take 1 capsule (40 mg total) by mouth daily. 02/10/21 02/10/22  Raulkar, Drema Pry, MD  fluticasone (FLONASE) 50 MCG/ACT nasal spray Place 2 sprays into both nostrils daily.    [provider]      Allergies    Patient has no known allergies.    Review of Systems   Review of Systems  Physical Exam Updated Vital Signs BP 140/86 (BP Location: Left Arm)    Pulse 72    Temp 98.2 F (36.8 C) (Oral)    Resp 16    Ht 6' (1.829 m)    Wt (!) 142.9 kg    SpO2 100%    BMI 42.72 kg/m  Physical Exam Constitutional:      General: He is not in acute distress. HENT:     Head: Normocephalic and atraumatic.  Eyes:     Conjunctiva/sclera: Conjunctivae normal.     Pupils: Pupils are equal, round, and reactive to light.  Cardiovascular:     Rate and Rhythm: Normal rate and regular rhythm.  Skin:    General: Skin is warm and dry.     Comments: Cauterize wound of the lateral aspect of the distal right thumb, no  active bleeding.  No nailbed involvement or subungual hematoma.  No surrounding erythema.  Neurological:     General: No focal deficit present.     Mental Status: He is alert and oriented to person, place, and time. Mental status is at baseline.    ED Results / Procedures / Treatments   Labs (all labs ordered are listed, but only abnormal results are displayed) Labs Reviewed - No data to display  EKG None  Radiology No results found.  Procedures Procedures    Medications Ordered in ED Medications - No data to display  ED Course/ Medical Decision Making/ A&P Clinical Course as of 02/13/21 1611  Thu Feb 13, 2021  1600 Patient reassessed and he continues to have no bleeding.  At this time I think he is reasonably safe for discharge home. [MT]    Clinical Course User Index [MT] Terald Sleeper, MD                           Medical Decision Making  Patient is here with an isolated injury to the right thumb, wound is hemostatic on arrival, has been significantly cauterized at  the urgent care.  He denies any signs of surrounding infection.  We will monitor the wound for period of time in the ED, if there is no further bleeding he can be discharged home.  His tetanus is up-to-date.  I do not see evidence of infection at this time.  Patient verbalized understanding.  I advised NSAIDs and Tylenol at home for pain, try to avoid typing with his thumb for the next 2 weeks.  Clinical Course as of 02/13/21 1611  Thu Feb 13, 2021  1600 Patient reassessed and he continues to have no bleeding.  At this time I think he is reasonably safe for discharge home. [MT]    Clinical Course User Index [MT] Ardys Hataway, Kermit Balo, MD           Final Clinical Impression(s) / ED Diagnoses Final diagnoses:  Laceration of right thumb, foreign body presence unspecified, nail damage status unspecified, initial encounter    Rx / DC Orders ED Discharge Orders     None         Terald Sleeper, MD 02/13/21 1611

## 2021-02-13 NOTE — ED Notes (Signed)
Dc instructions reviewed with patient. Patient voiced understanding. Dc with belongings.  °

## 2021-02-13 NOTE — Telephone Encounter (Signed)
Reason for Disposition  [1] Bleeding AND [2] won't stop after 10 minutes of direct pressure (using correct technique)  Answer Assessment - Initial Assessment Questions 1. MECHANISM: "How did the injury happen?"      I cut my thumb on the side while cutting some unions. 2. ONSET: "When did the injury happen?" (Minutes or hours ago)      It will not stop bleeding.   I've held it above my heart for 20 minutes.   I take a baby aspirin.    3. LOCATION: "What part of the finger is injured?" "Is the nail damaged?"      Right thumb.  4. APPEARANCE of the INJURY: "What does the injury look like?"      Cut open and it won't stop bleeding. At this point I referred him on to the urgent care that is close to him. 5. SEVERITY: "Can you use the hand normally?"  "Can you bend your fingers into a ball and then fully open them?"     *No Answer* 6. SIZE: For cuts, bruises, or swelling, ask: "How large is it?" (e.g., inches or centimeters;  entire finger)      *No Answer* 7. PAIN: "Is there pain?" If Yes, ask: "How bad is the pain?"    (e.g., Scale 1-10; or mild, moderate, severe)  - NONE (0): no pain.  - MILD (1-3): doesn't interfere with normal activities.   - MODERATE (4-7): interferes with normal activities or awakens from sleep.  - SEVERE (8-10): excruciating pain, unable to hold a glass of water or bend finger even a little.     *No Answer* 8. TETANUS: For any breaks in the skin, ask: "When was the last tetanus booster?"     *No Answer* 9. OTHER SYMPTOMS: "Do you have any other symptoms?"     *No Answer* 10. PREGNANCY: "Is there any chance you are pregnant?" "When was your last menstrual period?"       *No Answer*  Protocols used: Finger Injury-A-AH

## 2021-02-13 NOTE — ED Triage Notes (Signed)
Pt from UC presents with R thumb injury from a mandolin slicer. Pt states they were unable to control bleeding.Coban bandage in place on arrival and bleeding is controlled.

## 2021-02-13 NOTE — Discharge Instructions (Addendum)
-  Head to the ED for further management. They have more tools to help stop uncontrollable bleeding.

## 2021-02-13 NOTE — Telephone Encounter (Signed)
°  Chief Complaint: cut right thumb while cutting onions and it won't stop bleeding even with direct pressure and held above his heart for 20 minutes. Symptoms: See above Takes a baby aspirin a day due to history of a stroke. Frequency: Now Pertinent Negatives: Patient denies N/A Disposition: [] ED /[x] Urgent Care (no appt availability in office) / [] Appointment(In office/virtual)/ []  [facility]/ [] Home Care/ [] Refused Recommended Disposition /[]  Mobile Bus/ []  Follow-up with PCP Additional Notes: Referred to urgent care due to urgency of situation and the possible need for sutures.   Per pt there is one close to him.   Agreeable to going there now.

## 2021-02-13 NOTE — ED Triage Notes (Signed)
Pt here with right thumb injury from a mandolin at home about an hour ago. Sliced a section of flesh from outside of the right thumb. Actively bleeding in triage.

## 2021-02-14 MED ORDER — FLUOXETINE HCL 40 MG PO CAPS: 40.0000 mg | ORAL_CAPSULE | Freq: Every day | ORAL | 0 refills | Status: DC

## 2021-02-18 ENCOUNTER — Ambulatory Visit: Payer: BC Managed Care – PPO

## 2021-02-20 ENCOUNTER — Ambulatory Visit: Payer: BC Managed Care – PPO

## 2021-02-25 ENCOUNTER — Ambulatory Visit: Payer: BC Managed Care – PPO

## 2021-02-27 ENCOUNTER — Ambulatory Visit: Payer: BC Managed Care – PPO

## 2021-03-04 ENCOUNTER — Ambulatory Visit: Payer: BC Managed Care – PPO

## 2021-03-04 NOTE — Progress Notes (Signed)
[name] [address] [place] [address] Phone: [phone] Subjective:   Curtis [name], am serving as a scribe for Dr. Antoine Primas. This visit occurred during the SARS-CoV-2 public health emergency.  Safety protocols were in place, including screening questions prior to the visit, additional usage of staff PPE, and extensive cleaning of exam room while observing appropriate contact time as indicated for disinfecting solutions.   I'm seeing this patient by the request  of:  Larae Grooms, NP  CC: neck exam shows   [id number]  Curtis [name] is a 36 y.o. male coming in with complaint of back and neck pain. OMT on 01/22/2021. Patient states that he is stiff in middle of lumbar spine.  Patient states that this seems to be the most painful area.  Patient still having some difficulty with the balance status post his vertebral artery dissection.  Medications patient has been prescribed: None  Taking:         Reviewed prior external information including notes and imaging from previsou exam, outside providers and external EMR if available.   As well as notes that were available from care everywhere and other healthcare systems.  Past medical history, social, surgical and family history all reviewed in electronic medical record.  No pertanent information unless stated regarding to the chief complaint.   Past Medical History:  Diagnosis Date   Acute cerebrovascular accident (CVA) of medulla oblongata ([facility]) 09/09/2020   a.) small acute infarct of the lateral LEFT medulla in setting of LEFT vertebral artery dissection.   Anxiety    ASD (atrial septal defect) 09/14/2020   a.) TTE 09/14/2020: EF 65-70%; (+) IAS noted on color flow Doppler.   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    No Known Allergies   Review of Systems:  No headache, visual changes, nausea, vomiting, diarrhea, constipation, dizziness,  abdominal pain, skin rash, fevers, chills, night sweats, weight loss, swollen lymph nodes, body aches, joint swelling, chest pain, shortness of breath, mood changes. POSITIVE muscle aches, difficulty with balance  Objective  Blood pressure 122/86, pulse 75, height 6' (1.829 m), weight (!) 325 lb (147.4 kg), SpO2 96 %.   General: No apparent distress alert and oriented x3 mood and affect normal, dressed appropriately.  Overweight HEENT: Pupils equal, extraocular movements intact  Respiratory: Patient's speak in full sentences and does not appear short of breath  Cardiovascular: No lower extremity edema, non tender, no erythema  Back exam shows patient does have some mild hypermobility noted.  Osteopathic findings  T6 extended rotated and side bent right inhaled rib T11 extended rotated and side bent left L1 flexed rotated and side bent right Sacrum right on right       Assessment and Plan:  Thoracic back pain Most of her discomfort seems to be in the thoracal lumbar area.  Patient did respond well though to osteopathic manipulation.  Discussed core strengthening again.  Avoided any significant manipulation on the neck patient is doing well with conservative therapy and will follow-up with me again in 6 weeks    Nonallopathic problems  Decision today to treat with OMT was based on Physical Exam  After verbal consent patient was treated with HVLA, ME, FPR techniques in  rib, thoracic, lumbar, and sacral  areas  Patient tolerated the procedure well with improvement in symptoms  Patient given exercises, stretches and lifestyle modifications  See medications in patient instructions if given  Patient will  follow up in 4-8 weeks     The above documentation has been reviewed and is accurate and complete [name], DO        Note: This dictation was prepared with Dragon dictation along with smaller phrase technology. Any transcriptional errors that result from this  process are unintentional.

## 2021-03-05 ENCOUNTER — Ambulatory Visit (INDEPENDENT_AMBULATORY_CARE_PROVIDER_SITE_OTHER): Payer: BC Managed Care – PPO | Admitting: Family Medicine

## 2021-03-05 ENCOUNTER — Encounter: Payer: Self-pay | Admitting: Family Medicine

## 2021-03-05 VITALS — BP 122/86 | HR 75 | Ht 72.0 in | Wt 325.0 lb

## 2021-03-05 DIAGNOSIS — M9902 Segmental and somatic dysfunction of thoracic region: Secondary | ICD-10-CM | POA: Diagnosis not present

## 2021-03-05 DIAGNOSIS — M9904 Segmental and somatic dysfunction of sacral region: Secondary | ICD-10-CM | POA: Diagnosis not present

## 2021-03-05 DIAGNOSIS — M9903 Segmental and somatic dysfunction of lumbar region: Secondary | ICD-10-CM

## 2021-03-05 DIAGNOSIS — G8929 Other chronic pain: Secondary | ICD-10-CM

## 2021-03-05 DIAGNOSIS — M546 Pain in thoracic spine: Secondary | ICD-10-CM | POA: Diagnosis not present

## 2021-03-05 NOTE — Assessment & Plan Note (Signed)
Most of her discomfort seems to be in the thoracal lumbar area.  Patient did respond well though to osteopathic manipulation.  Discussed core strengthening again.  Avoided any significant manipulation on the neck patient is doing well with conservative therapy and will follow-up with me again in 6 weeks

## 2021-03-05 NOTE — Patient Instructions (Signed)
Thanks for showing off flexibility  See me in 6 weeks

## 2021-03-06 ENCOUNTER — Ambulatory Visit: Payer: BC Managed Care – PPO

## 2021-03-11 ENCOUNTER — Ambulatory Visit: Payer: BC Managed Care – PPO

## 2021-03-13 ENCOUNTER — Ambulatory Visit: Payer: BC Managed Care – PPO

## 2021-03-18 ENCOUNTER — Ambulatory Visit: Payer: BC Managed Care – PPO

## 2021-03-20 ENCOUNTER — Ambulatory Visit: Payer: BC Managed Care – PPO

## 2021-03-24 ENCOUNTER — Ambulatory Visit: Payer: BC Managed Care – PPO | Admitting: Physical Medicine and Rehabilitation

## 2021-03-25 ENCOUNTER — Ambulatory Visit: Payer: BC Managed Care – PPO

## 2021-03-26 ENCOUNTER — Encounter: Payer: Self-pay | Admitting: Nurse Practitioner

## 2021-03-26 ENCOUNTER — Ambulatory Visit: Payer: BC Managed Care – PPO | Admitting: Nurse Practitioner

## 2021-03-26 ENCOUNTER — Other Ambulatory Visit: Payer: Self-pay

## 2021-03-26 VITALS — BP 130/84 | HR 80 | Temp 99.5°F | Wt 325.8 lb

## 2021-03-26 DIAGNOSIS — R0683 Snoring: Secondary | ICD-10-CM | POA: Diagnosis not present

## 2021-03-26 DIAGNOSIS — K219 Gastro-esophageal reflux disease without esophagitis: Secondary | ICD-10-CM

## 2021-03-26 MED ORDER — ATORVASTATIN CALCIUM 20 MG PO TABS: 20.0000 mg | ORAL_TABLET | Freq: Every day | ORAL | 1 refills | Status: DC

## 2021-03-26 MED ORDER — OMEPRAZOLE 20 MG PO CPDR: 20.0000 mg | DELAYED_RELEASE_CAPSULE | Freq: Every day | ORAL | 3 refills | Status: DC

## 2021-03-26 NOTE — Progress Notes (Signed)
? ?BP 130/84   Pulse 80   Temp 99.5 ?F (37.5 ?C) (Oral)   Wt (!) 325 lb 12.8 oz (147.8 kg)   SpO2 98%   BMI 44.19 kg/m?   ? ?Subjective:  ? ? Patient ID: Curtis [name], male    DOB: [birth year], 36 y.o.   MRN: [id number] ? ?HPI: ?Curtis [name] is a 36 y.o. male ? ?Chief Complaint  ?Patient presents with  ? Cerebrovascular Accident  ?  3 month f/up  ? ?Presented to [facility] 09/09/2020 left-sided numbness headache dizziness as well as nausea x1 week.  Patient had been seeing the MRI of the brain showed a small acute stroke of the lateral left medulla.  CTA head neck irregularity of the V2 segment of the left vertebral artery with sites of up to mild to moderate stenosis.  Irregularity of the V4 left vertebral artery with sites of up to moderate/severe stenosis.  MRI of the brain follow-up more conspicuous signal abnormality in the left medulla that could possibly evolutionary change of previous stroke.  Neurology follow-up maintained on aspirin and Plavix x3 months.  Patient would require repeat vascular imaging in 3 months to determine need to continue DAPT.  ? ?Patient has been doing well since his stroke.  He is continuing to improve.  He denies concerns regarding his stroke at visit today.  ? ?He is having reflux every single night.  Has been using Pepcid which is not helping him.  He is waking up fatigued and feeling tired.  He snores at night.  He has had to lay down and take naps during the day which is very un like him.    ? ? ? ?Patient denies a history of: Hypertension, Elevated Cholesterol, Diabetes, Thyroid problems, Depression, Anxiety, Neurological problems, and Abdominal problems.  ? ? ?Active Ambulatory Problems  ?  Diagnosis Date Noted  ? Vertebral artery dissection (HCC) 09/09/2020  ? Obesity (BMI 35.0-39.9 without comorbidity) 09/09/2020  ? Essential hypertension 09/09/2020  ? Acute stroke of medulla oblongata (HCC)   ? Stroke (HCC) 09/10/2020  ? Lateral medullary syndrome   ? Ischemic  cerebrovascular accident (CVA) of frontal lobe (HCC) 09/13/2020  ? ASD (atrial septal defect)   ? Slow transit constipation   ? Morbid obesity (HCC)   ? Neck pain   ? Thoracic back pain 11/01/2020  ? Somatic dysfunction of spine, thoracic 11/01/2020  ? ?Resolved Ambulatory Problems  ?  Diagnosis Date Noted  ? No Resolved Ambulatory Problems  ? ?Past Medical History:  ?Diagnosis Date  ? Acute cerebrovascular accident (CVA) of medulla oblongata (HCC) 09/09/2020  ? Anxiety   ? Heart murmur   ? ?Past Surgical History:  ?Procedure Laterality Date  ? GALLBLADDER SURGERY  01/23/2021  ? removed  ? ? ?Family History  ?Problem Relation Age of Onset  ? COPD Maternal Grandmother   ? Arthritis Maternal Grandmother   ? Alcohol abuse Maternal Grandfather   ? Heart attack Maternal Grandfather   ? ? ? ?Review of Systems  ?Constitutional:  Positive for fatigue.  ?Respiratory:    ?     Snoring  ?Gastrointestinal:   ?     Reflux  ? ?Per HPI unless specifically indicated above ? ?   ?Objective:  ?  ?BP 130/84   Pulse 80   Temp 99.5 ?F (37.5 ?C) (Oral)   Wt (!) 325 lb 12.8 oz (147.8 kg)   SpO2 98%   BMI 44.19 kg/m?   ?Wt Readings from Last  3 Encounters:  ?03/26/21 (!) 325 lb 12.8 oz (147.8 kg)  ?03/05/21 (!) 325 lb (147.4 kg)  ?02/13/21 (!) 315 lb (142.9 kg)  ?  ?Physical Exam ?Vitals and nursing note reviewed.  ?Constitutional:   ?   General: He is not in acute distress. ?   Appearance: Normal appearance. He is obese. He is not ill-appearing, toxic-appearing or diaphoretic.  ?HENT:  ?   Head: Normocephalic.  ?   Right Ear: External ear normal.  ?   Left Ear: External ear normal.  ?   Nose: Nose normal. No congestion or rhinorrhea.  ?   Mouth/Throat:  ?   Mouth: Mucous membranes are moist.  ?Eyes:  ?   General:     ?   Right eye: No discharge.     ?   Left eye: No discharge.  ?   Extraocular Movements: Extraocular movements intact.  ?   Conjunctiva/sclera: Conjunctivae normal.  ?   Pupils: Pupils are equal, round, and reactive to  light.  ?Cardiovascular:  ?   Rate and Rhythm: Normal rate and regular rhythm.  ?   Heart sounds: No murmur heard. ?Pulmonary:  ?   Effort: Pulmonary effort is normal. No respiratory distress.  ?   Breath sounds: Normal breath sounds. No wheezing, rhonchi or rales.  ?Abdominal:  ?   General: Abdomen is flat. Bowel sounds are normal.  ?Musculoskeletal:  ?   Cervical back: Normal range of motion and neck supple.  ?Skin: ?   General: Skin is warm and dry.  ?   Capillary Refill: Capillary refill takes less than 2 seconds.  ?Neurological:  ?   General: No focal deficit present.  ?   Mental Status: He is alert and oriented to person, place, and time.  ?Psychiatric:     ?   Mood and Affect: Mood normal.     ?   Behavior: Behavior normal.     ?   Thought Content: Thought content normal.     ?   Judgment: Judgment normal.  ? ? ?Results for orders placed or performed during the hospital encounter of 01/23/21  ?Surgical pathology  ?Result Value Ref Range  ? SURGICAL PATHOLOGY    ?  SURGICAL PATHOLOGY ?CASE: [id number] ?PATIENT: Curtis [name] ?Surgical Pathology Report ? ? ? ? ?Specimen Submitted: ?A. Gallbladder ? ?Clinical History: K80.50 biliary colic ? ? ? ?DIAGNOSIS: ?A. GALLBLADDER; CHOLECYSTECTOMY: ?- CHRONIC CHOLECYSTITIS WITH CHOLELITHIASIS. ?- NEGATIVE FOR DYSPLASIA AND MALIGNANCY. ? ?GROSS DESCRIPTION: ?A. Labeled: Gallbladder ?Received: Fresh ?Collection time: 8:55 AM on 01/23/2021 ?Placed into formalin time: 9:33 AM on 01/23/2021 ?Size of specimen: 8.7 x 4.2 x 2.8 cm ?Specimen integrity: Focally disrupted ?External surface: The serosa is tan-pink, smooth, and glistening with a ?roughened hepatic bed.  Within the hepatic bed there is a 0.3 x 0.2 cm ?transmural defect. ?Wall thickness: 0.1 cm ?Mucosa: The mucosa is tan and velvety with scattered areas of fine ?cholesterolosis. ?Cystic duct: The cystic duct is 1 x 0.7 x 0.5 cm.  The duct is patent ?and grossly unremarkable.  A distinct adjacent lymph node  candidate is ?not grossly identified. ?Bile p resent: There is green viscous bile. ?Stones present: There are 2 yellow, firm, finely lobulated stones ?ranging from 1.7 to 2.1 cm in greatest dimension. ?Other findings: The wall is focally thickened, up to 0.5 cm, by ?overlying adipose tissue. ? ?Block summary: ?1 - cystic duct resection margin, en face and inked blue, with ?representative thickened wall and fine cholesterolosis ? ?  RB 01/23/2021 ? ?Final Diagnosis performed by Katherine Mantle, MD.   Electronically signed ?01/24/2021 8:43:49AM ?The electronic signature indicates that the named Attending Pathologist ?has evaluated the specimen ?Technical component performed at [facility], [address] [place], ?[place] [address] Lab: [phone] Dir: Jolene Schimke, MD, MMM ? Professional component performed at [facility], [facility], [address] [place], [place], [place] [address] Lab: [phone] ?Dir: Beryle Quant, MD ?  ? ?   ?Assessment & Plan:  ? ?Problem List Items Addressed This Visit   ?None ?Visit Diagnoses   ? ? Snoring    -  Primary  ? Order for sleep study placed. Advised patient that weight loss would likely improve symptoms.  Will await sleep study results for treatment plan.   ? Relevant Orders  ? Ambulatory referral to Sleep Studies  ? Gastroesophageal reflux disease, unspecified whether esophagitis present      ? Discussed diet modifications. Took Pepcid without improved. Will send in Omeprazole 20mg  daily. FU in 1 month for reevaluation.   ? Relevant Medications  ? omeprazole (PRILOSEC) 20 MG capsule  ? ?  ?  ? ?Follow up plan: ?Return in about 1 month (around 04/26/2021) for Heart Burn. ? ? ? ? ? ? ? ?

## 2021-03-27 ENCOUNTER — Ambulatory Visit: Payer: BC Managed Care – PPO

## 2021-04-01 ENCOUNTER — Ambulatory Visit: Payer: BC Managed Care – PPO

## 2021-04-03 ENCOUNTER — Ambulatory Visit: Payer: BC Managed Care – PPO

## 2021-04-08 ENCOUNTER — Encounter: Payer: Self-pay | Admitting: Nurse Practitioner

## 2021-04-08 ENCOUNTER — Ambulatory Visit: Payer: BC Managed Care – PPO

## 2021-04-10 ENCOUNTER — Ambulatory Visit: Payer: BC Managed Care – PPO

## 2021-04-14 ENCOUNTER — Encounter: Payer: Self-pay | Admitting: Adult Health

## 2021-04-14 ENCOUNTER — Ambulatory Visit: Payer: BC Managed Care – PPO | Admitting: Physical Medicine and Rehabilitation

## 2021-04-15 ENCOUNTER — Encounter: Payer: Self-pay | Admitting: Physical Medicine & Rehabilitation

## 2021-04-15 ENCOUNTER — Ambulatory Visit: Payer: BC Managed Care – PPO

## 2021-04-15 NOTE — Progress Notes (Signed)
?Terrilee Files D.O. ?[facility] ?[address] [place] [address] ?Phone: [phone] ?Subjective:   ?I, [name], am serving as a scribe for Dr. Antoine Primas. ? ?This visit occurred during the SARS-CoV-2 public health emergency.  Safety protocols were in place, including screening questions prior to the visit, additional usage of staff PPE, and extensive cleaning of exam room while observing appropriate contact time as indicated for disinfecting solutions.  ?I'm seeing this patient by the request  of:  Larae Grooms, NP ? ?CC: Back pain ? ?[id number]  ?Curtis [name] is a 36 y.o. male coming in with complaint of back pain. OMT on 03/05/2021. Patient states that his back is sore and tight after moving some tile.  Patient states that overall doing treatment.  Just more tightness.  Still responding well to his manipulations.  Doing the exercises very occasionally. ? ?Medications patient has been prescribed: None ? ? ? ?  ? ? ? ? ?Reviewed prior external information including notes and imaging from previsou exam, outside providers and external EMR if available.  ? ?As well as notes that were available from care everywhere and other healthcare systems. ? ?Past medical history, social, surgical and family history all reviewed in electronic medical record.  No pertanent information unless stated regarding to the chief complaint.  ? ?Past Medical History:  ?Diagnosis Date  ? Acute cerebrovascular accident (CVA) of medulla oblongata (HCC) 09/09/2020  ? a.) small acute infarct of the lateral LEFT medulla in setting of LEFT vertebral artery dissection.  ? Anxiety   ? ASD (atrial septal defect) 09/14/2020  ? a.) TTE 09/14/2020: EF 65-70%; (+) IAS noted on color flow Doppler.  ? Heart murmur   ? LEFT vertebral artery dissection (HCC) 09/09/2020  ?  ?No Known Allergies ? ? ?Review of Systems: ? No headache, visual changes, nausea, vomiting, diarrhea, constipation, dizziness, abdominal pain, skin  rash, fevers, chills, night sweats, weight loss, swollen lymph nodes, body aches, joint swelling, chest pain, shortness of breath, mood changes. POSITIVE muscle aches ? ?Objective  ?Blood pressure 112/88, pulse 78, height 6' (1.829 m), weight (!) 333 lb (151 kg), SpO2 97 %. ?  ?General: No apparent distress alert and oriented x3 mood and affect normal, dressed appropriately.  ?HEENT: Pupils equal, extraocular movements intact  ?Respiratory: Patient's speak in full sentences and does not appear short of breath  ?Cardiovascular: No lower extremity edema, non tender, no erythema  ? ?Poor core strength still noted.  Patient still overweight.  Patient does have tenderness to palpation in the paraspinal musculature more in the thoracolumbar junction.  Tightness noted in the right sacroiliac joint as well. ? ?Osteopathic findings ?T4 extended rotated and side bent left ?T9 extended rotated and side bent left ?L2 flexed rotated and side bent right ?Sacrum right on right ? ? ? ?  ?Assessment and Plan: ? ?Thoracic back pain ?Chronic problem with mild exacerbation.  Discussed icing regimen and home exercise, discussed which activities to do and which ones to avoid.  Still responding well to osteopathic manipulation and patient does not take any significant medications.  Patient will increase activity slowly.  Follow-up with me again in 6 to 8 weeks. ?  ? ?Nonallopathic problems ? ?Decision today to treat with OMT was based on Physical Exam ? ?After verbal consent patient was treated with HVLA, ME, FPR techniques in  thoracic, lumbar, and sacral  areas ? ?Patient tolerated the procedure well with improvement in symptoms ? ?Patient given exercises, stretches  and lifestyle modifications ? ?See medications in patient instructions if given ? ?Patient will follow up in 4-8 weeks ? ?  ? ? ?The above documentation has been reviewed and is accurate and complete [name], DO ? ? ? ?  ? ? Note: This dictation was prepared with  Dragon dictation along with smaller phrase technology. Any transcriptional errors that result from this process are unintentional.    ?  ?  ? ?

## 2021-04-16 ENCOUNTER — Ambulatory Visit (INDEPENDENT_AMBULATORY_CARE_PROVIDER_SITE_OTHER): Payer: BC Managed Care – PPO | Admitting: Family Medicine

## 2021-04-16 ENCOUNTER — Other Ambulatory Visit: Payer: Self-pay

## 2021-04-16 VITALS — BP 112/88 | HR 78 | Ht 72.0 in | Wt 333.0 lb

## 2021-04-16 DIAGNOSIS — M9903 Segmental and somatic dysfunction of lumbar region: Secondary | ICD-10-CM | POA: Diagnosis not present

## 2021-04-16 DIAGNOSIS — M9904 Segmental and somatic dysfunction of sacral region: Secondary | ICD-10-CM | POA: Diagnosis not present

## 2021-04-16 DIAGNOSIS — M546 Pain in thoracic spine: Secondary | ICD-10-CM | POA: Diagnosis not present

## 2021-04-16 DIAGNOSIS — G8929 Other chronic pain: Secondary | ICD-10-CM

## 2021-04-16 DIAGNOSIS — M9902 Segmental and somatic dysfunction of thoracic region: Secondary | ICD-10-CM

## 2021-04-16 NOTE — Assessment & Plan Note (Signed)
Chronic problem with mild exacerbation.  Discussed icing regimen and home exercise, discussed which activities to do and which ones to avoid.  Still responding well to osteopathic manipulation and patient does not take any significant medications.  Patient will increase activity slowly.  Follow-up with me again in 6 to 8 weeks. ?

## 2021-04-17 ENCOUNTER — Ambulatory Visit: Payer: BC Managed Care – PPO

## 2021-04-22 ENCOUNTER — Ambulatory Visit: Payer: BC Managed Care – PPO

## 2021-04-24 ENCOUNTER — Ambulatory Visit: Payer: BC Managed Care – PPO

## 2021-04-25 NOTE — Progress Notes (Signed)
? ?BP (!) 136/97 (BP Location: Right Arm, Cuff Size: Large)   Pulse 76   Temp 98.3 ?F (36.8 ?C) (Oral)   Wt (!) 323 lb 6.4 oz (146.7 kg)   SpO2 98%   BMI 43.86 kg/m?   ? ?Subjective:  ? ? Patient ID: Curtis [name], male    DOB: 1985-04-18, 36 y.o.   MRN: [id number] ? ?HPI: ?Curtis [name] is a 36 y.o. male ? ?Chief Complaint  ?Patient presents with  ? Heartburn  ?  Follow up, pt reports feeling well with medication rx'd last office visit   ? ?GERD ?GERD control status: controlledSatisfied with current treatment? yes ?Heartburn frequency:  ?Medication side effects: no  ?Medication compliance: better ?Previous GERD medications: ?Antacid use frequency:   ?Duration:  ?Nature:  ?Location:  ?Heartburn duration:  ?Alleviatiating factors:   ?Aggravating factors:  ?Dysphagia: no ?Odynophagia:  no ?Hematemesis: no ?Blood in stool: no ?EGD: no ? ?Relevant past medical, surgical, family and social history reviewed and updated as indicated. Interim medical history since our last visit reviewed. ?Allergies and medications reviewed and updated. ? ?Review of Systems  ?Gastrointestinal:   ?     GERD  ? ?Per HPI unless specifically indicated above ? ?   ?Objective:  ?  ?BP (!) 136/97 (BP Location: Right Arm, Cuff Size: Large)   Pulse 76   Temp 98.3 ?F (36.8 ?C) (Oral)   Wt (!) 323 lb 6.4 oz (146.7 kg)   SpO2 98%   BMI 43.86 kg/m?   ?Wt Readings from Last 3 Encounters:  ?04/28/21 (!) 323 lb 6.4 oz (146.7 kg)  ?04/16/21 (!) 333 lb (151 kg)  ?03/26/21 (!) 325 lb 12.8 oz (147.8 kg)  ?  ?Physical Exam ?Vitals and nursing note reviewed.  ?Constitutional:   ?   General: He is not in acute distress. ?   Appearance: Normal appearance. He is obese. He is not ill-appearing, toxic-appearing or diaphoretic.  ?HENT:  ?   Head: Normocephalic.  ?   Right Ear: External ear normal.  ?   Left Ear: External ear normal.  ?   Nose: Nose normal. No congestion or rhinorrhea.  ?   Mouth/Throat:  ?   Mouth: Mucous membranes are moist.  ?Eyes:  ?    General:     ?   Right eye: No discharge.     ?   Left eye: No discharge.  ?   Extraocular Movements: Extraocular movements intact.  ?   Conjunctiva/sclera: Conjunctivae normal.  ?   Pupils: Pupils are equal, round, and reactive to light.  ?Cardiovascular:  ?   Rate and Rhythm: Normal rate and regular rhythm.  ?   Heart sounds: No murmur heard. ?Pulmonary:  ?   Effort: Pulmonary effort is normal. No respiratory distress.  ?   Breath sounds: Normal breath sounds. No wheezing, rhonchi or rales.  ?Abdominal:  ?   General: Abdomen is flat. Bowel sounds are normal.  ?Musculoskeletal:  ?   Cervical back: Normal range of motion and neck supple.  ?Skin: ?   General: Skin is warm and dry.  ?   Capillary Refill: Capillary refill takes less than 2 seconds.  ?Neurological:  ?   General: No focal deficit present.  ?   Mental Status: He is alert and oriented to person, place, and time.  ?Psychiatric:     ?   Mood and Affect: Mood normal.     ?   Behavior: Behavior normal.     ?  Thought Content: Thought content normal.     ?   Judgment: Judgment normal.  ? ? ?Results for orders placed or performed during the hospital encounter of 01/23/21  ?Surgical pathology  ?Result Value Ref Range  ? SURGICAL PATHOLOGY    ?  SURGICAL PATHOLOGY ?CASE: [id number] ?PATIENT: Jerimah Leng ?Surgical Pathology Report ? ? ? ? ?Specimen Submitted: ?A. Gallbladder ? ?Clinical History: K80.50 biliary colic ? ? ? ?DIAGNOSIS: ?A. GALLBLADDER; CHOLECYSTECTOMY: ?- CHRONIC CHOLECYSTITIS WITH CHOLELITHIASIS. ?- NEGATIVE FOR DYSPLASIA AND MALIGNANCY. ? ?GROSS DESCRIPTION: ?A. Labeled: Gallbladder ?Received: Fresh ?Collection time: 8:55 AM on 01/23/2021 ?Placed into formalin time: 9:33 AM on 01/23/2021 ?Size of specimen: 8.7 x 4.2 x 2.8 cm ?Specimen integrity: Focally disrupted ?External surface: The serosa is tan-pink, smooth, and glistening with a ?roughened hepatic bed.  Within the hepatic bed there is a 0.3 x 0.2 cm ?transmural defect. ?Wall  thickness: 0.1 cm ?Mucosa: The mucosa is tan and velvety with scattered areas of fine ?cholesterolosis. ?Cystic duct: The cystic duct is 1 x 0.7 x 0.5 cm.  The duct is patent ?and grossly unremarkable.  A distinct adjacent lymph node candidate is ?not grossly identified. ?Bile p resent: There is green viscous bile. ?Stones present: There are 2 yellow, firm, finely lobulated stones ?ranging from 1.7 to 2.1 cm in greatest dimension. ?Other findings: The wall is focally thickened, up to 0.5 cm, by ?overlying adipose tissue. ? ?Block summary: ?1 - cystic duct resection margin, en face and inked blue, with ?representative thickened wall and fine cholesterolosis ? ?RB 01/23/2021 ? ?Final Diagnosis performed by Katherine Mantle, MD.   Electronically signed ?01/24/2021 8:43:49AM ?The electronic signature indicates that the named Attending Pathologist ?has evaluated the specimen ?Technical component performed at [facility], [address], [place], ?[place] [address] Lab: [phone] Dir: Jolene Schimke, MD, MMM ? Professional component performed at [facility], [facility], [address] [place], [place], [place] [address] Lab: [phone] ?Dir: Beryle Quant, MD ?  ? ?   ?Assessment & Plan:  ? ?Problem List Items Addressed This Visit   ? ?  ? Cardiovascular and Mediastinum  ? Essential hypertension - Primary  ?  Chronic. Elevated at visit today however, patient has had a stomach bug the last 24 hours.  Will check at next visit and adjust medications if needed at that time.  Follow up in 2 months for reevaluation. ?  ?  ? ?Other Visit Diagnoses   ? ? Gastroesophageal reflux disease, unspecified whether esophagitis present      ? Well controlled with Omeprazole.  Continue with current medication regimen.  Follow up in 2 months for reevaluation.  ? ?  ?  ? ?Follow up plan: ?Return in about 2 months (around 06/28/2021) for BP Check. ? ? ? ? ? ?

## 2021-04-28 ENCOUNTER — Ambulatory Visit: Payer: BC Managed Care – PPO | Admitting: Nurse Practitioner

## 2021-04-28 ENCOUNTER — Encounter: Payer: Self-pay | Admitting: Nurse Practitioner

## 2021-04-28 VITALS — BP 136/97 | HR 76 | Temp 98.3°F | Wt 323.4 lb

## 2021-04-28 DIAGNOSIS — K219 Gastro-esophageal reflux disease without esophagitis: Secondary | ICD-10-CM

## 2021-04-28 DIAGNOSIS — I10 Essential (primary) hypertension: Secondary | ICD-10-CM

## 2021-04-28 NOTE — Assessment & Plan Note (Signed)
Chronic. Elevated at visit today however, patient has had a stomach bug the last 24 hours.  Will check at next visit and adjust medications if needed at that time.  Follow up in 2 months for reevaluation. ?

## 2021-05-19 ENCOUNTER — Telehealth: Payer: Self-pay | Admitting: Nurse Practitioner

## 2021-05-19 DIAGNOSIS — R0683 Snoring: Secondary | ICD-10-CM

## 2021-05-19 NOTE — Telephone Encounter (Signed)
At home sleep study ordered.

## 2021-06-11 NOTE — Progress Notes (Signed)
[name] [address] [place] [address] Phone: [phone] Subjective:   Curtis [name], am serving as a scribe for Dr. Antoine Primas. This visit occurred during the SARS-CoV-2 public health emergency.  Safety protocols were in place, including screening questions prior to the visit, additional usage of staff PPE, and extensive cleaning of exam room while observing appropriate contact time as indicated for disinfecting solutions.   I'm seeing this patient by the request  of:  Larae Grooms, NP  CC: Back pain follow-up  [id number]  Curtis [name] is a 36 y.o. male coming in with complaint of back and neck pain. OMT on 04/16/2021. Patient states same per usual. Right shoulder is super tight. No other complaints.  Patient is try to be doing a little more activity.  Does not feel like he injured it at all.  Medications patient has been prescribed: None  Taking:         Reviewed prior external information including notes and imaging from previsou exam, outside providers and external EMR if available.   As well as notes that were available from care everywhere and other healthcare systems.  Past medical history, social, surgical and family history all reviewed in electronic medical record.  No pertanent information unless stated regarding to the chief complaint.   Past Medical History:  Diagnosis Date   Acute cerebrovascular accident (CVA) of medulla oblongata (HCC) 09/09/2020   a.) small acute infarct of the lateral LEFT medulla in setting of LEFT vertebral artery dissection.   Anxiety    ASD (atrial septal defect) 09/14/2020   a.) TTE 09/14/2020: EF 65-70%; (+) IAS noted on color flow Doppler.   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    No Known Allergies   Review of Systems:  No headache, visual changes, nausea, vomiting, diarrhea, constipation, dizziness, abdominal pain, skin rash, fevers, chills,  night sweats, weight loss, swollen lymph nodes, body aches, joint swelling, chest pain, shortness of breath, mood changes. POSITIVE muscle aches  Objective  Blood pressure 122/84, pulse 73, height 6' (1.829 m), weight (!) 320 lb (145.2 kg), SpO2 98 %.   General: No apparent distress alert and oriented x3 mood and affect normal, dressed appropriately.  HEENT: Pupils equal, extraocular movements intact  Respiratory: Patient's speak in full sentences and does not appear short of breath  Cardiovascular: No lower extremity edema, non tender, no erythema  Back exam does have tightness noted in the paraspinal musculature of the neck.  Seems to have more tightness on the parascapular area.  Tenderness to palpation  Osteopathic findings  T9 extended rotated and side bent left L2 flexed rotated and side bent right L5 flexed rotated and side bent left Sacrum right on right       Assessment and Plan:  Thoracic back pain Chronic but stable overall.  Responding well to manipulation.  Still trying to be more active since patient's stroke.  Patient is doing better though with this as well.  Continuing to try to lose some weight.  We will follow-up with me again 6 to 8 weeks   Nonallopathic problems  Decision today to treat with OMT was based on Physical Exam  After verbal consent patient was treated with  ME, FPR techniques in  thoracic, lumbar, and sacral  areas  Patient tolerated the procedure well with improvement in symptoms  Patient given exercises, stretches and lifestyle modifications  See medications in patient instructions if given  Patient will follow up in 6-8 weeks      The above documentation has been reviewed and is accurate and complete Curtis Saa, DO        Note: This dictation was prepared with Dragon dictation along with smaller phrase technology. Any transcriptional errors that result from this process are unintentional.

## 2021-06-12 ENCOUNTER — Ambulatory Visit (INDEPENDENT_AMBULATORY_CARE_PROVIDER_SITE_OTHER): Payer: BC Managed Care – PPO | Admitting: Family Medicine

## 2021-06-12 VITALS — BP 122/84 | HR 73 | Ht 72.0 in | Wt 320.0 lb

## 2021-06-12 DIAGNOSIS — M9904 Segmental and somatic dysfunction of sacral region: Secondary | ICD-10-CM

## 2021-06-12 DIAGNOSIS — M546 Pain in thoracic spine: Secondary | ICD-10-CM

## 2021-06-12 DIAGNOSIS — G8929 Other chronic pain: Secondary | ICD-10-CM

## 2021-06-12 DIAGNOSIS — M9903 Segmental and somatic dysfunction of lumbar region: Secondary | ICD-10-CM

## 2021-06-12 DIAGNOSIS — M9902 Segmental and somatic dysfunction of thoracic region: Secondary | ICD-10-CM | POA: Diagnosis not present

## 2021-06-12 NOTE — Patient Instructions (Signed)
Good to see you! See you again in 6-8 weeks 

## 2021-06-13 ENCOUNTER — Encounter: Payer: Self-pay | Admitting: Family Medicine

## 2021-06-13 NOTE — Assessment & Plan Note (Signed)
Chronic but stable overall.  Responding well to manipulation.  Still trying to be more active since patient's stroke.  Patient is doing better though with this as well.  Continuing to try to lose some weight.  We will follow-up with me again 6 to 8 weeks

## 2021-07-01 NOTE — Progress Notes (Unsigned)
There were no vitals taken for this visit.   Subjective:    Patient ID: Curtis [name], male    DOB: 1985/05/27, 36 y.o.   MRN: [id number]  HPI: Curtis [name] is a 36 y.o. male  No chief complaint on file.  HYPERTENSION Hypertension status: {Blank single:19197::"controlled","uncontrolled","better","worse","exacerbated","stable"}  Satisfied with current treatment? {Blank single:19197::"yes","no"} Duration of hypertension: {Blank single:19197::"chronic","months","years"} BP monitoring frequency:  {Blank single:19197::"not checking","rarely","daily","weekly","monthly","a few times a day","a few times a week","a few times a month"} BP range:  BP medication side effects:  {Blank single:19197::"yes","no"} Medication compliance: {Blank single:19197::"excellent compliance","good compliance","fair compliance","poor compliance"} Previous BP meds:{Blank multiple:19196::"none","amlodipine","amlodipine/benazepril","atenolol","benazepril","benazepril/HCTZ","bisoprolol (bystolic)","carvedilol","chlorthalidone","clonidine","diltiazem","exforge HCT","HCTZ","irbesartan (avapro)","labetalol","lisinopril","lisinopril-HCTZ","losartan (cozaar)","methyldopa","nifedipine","olmesartan (benicar)","olmesartan-HCTZ","quinapril","ramipril","spironalactone","tekturna","valsartan","valsartan-HCTZ","verapamil"} Aspirin: {Blank single:19197::"yes","no"} Recurrent headaches: {Blank single:19197::"yes","no"} Visual changes: {Blank single:19197::"yes","no"} Palpitations: {Blank single:19197::"yes","no"} Dyspnea: {Blank single:19197::"yes","no"} Chest pain: {Blank single:19197::"yes","no"} Lower extremity edema: {Blank single:19197::"yes","no"} Dizzy/lightheaded: {Blank single:19197::"yes","no"}   Relevant past medical, surgical, family and social history reviewed and updated as indicated. Interim medical history since our last visit reviewed. Allergies and medications reviewed and updated.  Review of  Systems  Per HPI unless specifically indicated above     Objective:    There were no vitals taken for this visit.  Wt Readings from Last 3 Encounters:  06/12/21 (!) 320 lb (145.2 kg)  04/28/21 (!) 323 lb 6.4 oz (146.7 kg)  04/16/21 (!) 333 lb (151 kg)    Physical Exam  Results for orders placed or performed during the hospital encounter of 01/23/21  Surgical pathology  Result Value Ref Range   SURGICAL PATHOLOGY      SURGICAL PATHOLOGY CASE: [id number] PATIENT: Curtis [name] Surgical Pathology Report     Specimen Submitted: A. Gallbladder  Clinical History: K80.50 biliary colic    DIAGNOSIS: A. GALLBLADDER; CHOLECYSTECTOMY: - CHRONIC CHOLECYSTITIS WITH CHOLELITHIASIS. - NEGATIVE FOR DYSPLASIA AND MALIGNANCY.  GROSS DESCRIPTION: A. Labeled: Gallbladder Received: Fresh Collection time: 8:55 AM on 01/23/2021 Placed into formalin time: 9:33 AM on 01/23/2021 Size of specimen: 8.7 x 4.2 x 2.8 cm Specimen integrity: Focally disrupted External surface: The serosa is tan-pink, smooth, and glistening with a roughened hepatic bed.  Within the hepatic bed there is a 0.3 x 0.2 cm transmural defect. Wall thickness: 0.1 cm Mucosa: The mucosa is tan and velvety with scattered areas of fine cholesterolosis. Cystic duct: The cystic duct is 1 x 0.7 x 0.5 cm.  The duct is patent and grossly unremarkable.  A distinct adjacent lymph node candidate is not grossly identified. Bile p resent: There is green viscous bile. Stones present: There are 2 yellow, firm, finely lobulated stones ranging from 1.7 to 2.1 cm in greatest dimension. Other findings: The wall is focally thickened, up to 0.5 cm, by overlying adipose tissue.  Block summary: 1 - cystic duct resection margin, en face and inked blue, with representative thickened wall and fine cholesterolosis  RB 01/23/2021  Final Diagnosis performed by Katherine Mantle, MD.   Electronically signed 01/24/2021 8:43:49AM The  electronic signature indicates that the named Attending Pathologist has evaluated the specimen Technical component performed at [facility], [address] [place], [place] [address] Lab: [phone] Dir: Jolene Schimke, MD, MMM  Professional component performed at [facility], [facility], [address] [place], [place], [place] [address] Lab: [phone] Dir: Beryle Quant, MD       Assessment & Plan:   Problem List Items Addressed This Visit       Cardiovascular and Mediastinum   Essential hypertension - Primary     Follow up plan: No follow-ups on file.

## 2021-07-02 ENCOUNTER — Encounter: Payer: Self-pay | Admitting: Nurse Practitioner

## 2021-07-02 ENCOUNTER — Ambulatory Visit: Payer: BC Managed Care – PPO | Admitting: Nurse Practitioner

## 2021-07-02 DIAGNOSIS — I10 Essential (primary) hypertension: Secondary | ICD-10-CM | POA: Diagnosis not present

## 2021-07-02 DIAGNOSIS — E7800 Pure hypercholesterolemia, unspecified: Secondary | ICD-10-CM | POA: Diagnosis not present

## 2021-07-02 DIAGNOSIS — R5383 Other fatigue: Secondary | ICD-10-CM

## 2021-07-02 MED ORDER — ATORVASTATIN CALCIUM 20 MG PO TABS: 20.0000 mg | ORAL_TABLET | Freq: Every day | ORAL | 1 refills | Status: DC

## 2021-07-02 MED ORDER — FLUOXETINE HCL 40 MG PO CAPS: 40.0000 mg | ORAL_CAPSULE | Freq: Every day | ORAL | 1 refills | Status: DC

## 2021-07-02 NOTE — Assessment & Plan Note (Signed)
Labs ordered today. Will make recommendations based on lab results. Continue with Atorvastatin daily.

## 2021-07-02 NOTE — Assessment & Plan Note (Signed)
Recommend a healthy lifestyle through diet and exercise.  °

## 2021-07-02 NOTE — Assessment & Plan Note (Signed)
Chronic.  Controlled without medication..  Labs ordered today.  Return to clinic in 6 months for reevaluation.  Call sooner if concerns arise.  ° °

## 2021-07-03 LAB — COMPREHENSIVE METABOLIC PANEL
ALT: 18 IU/L (ref 0–44)
AST: 18 IU/L (ref 0–40)
Albumin/Globulin Ratio: 1.5 (ref 1.2–2.2)
Albumin: 4.3 g/dL (ref 4.0–5.0)
Alkaline Phosphatase: 123 IU/L — ABNORMAL HIGH (ref 44–121)
BUN/Creatinine Ratio: 12 (ref 9–20)
BUN: 10 mg/dL (ref 6–20)
Bilirubin Total: 0.4 mg/dL (ref 0.0–1.2)
CO2: 23 mmol/L (ref 20–29)
Calcium: 9.4 mg/dL (ref 8.7–10.2)
Chloride: 104 mmol/L (ref 96–106)
Creatinine, Ser: 0.81 mg/dL (ref 0.76–1.27)
Globulin, Total: 2.8 g/dL (ref 1.5–4.5)
Glucose: 112 mg/dL — ABNORMAL HIGH (ref 70–99)
Potassium: 4.5 mmol/L (ref 3.5–5.2)
Sodium: 140 mmol/L (ref 134–144)
Total Protein: 7.1 g/dL (ref 6.0–8.5)
eGFR: 119 mL/min/{1.73_m2} (ref >59)

## 2021-07-03 LAB — LIPID PANEL
Chol/HDL Ratio: 3.4 ratio (ref 0.0–5.0)
Cholesterol, Total: 163 mg/dL (ref 100–199)
HDL: 48 mg/dL (ref >39)
LDL Chol Calc (NIH): 90 mg/dL (ref 0–99)
Triglycerides: 145 mg/dL (ref 0–149)
VLDL Cholesterol Cal: 25 mg/dL (ref 5–40)

## 2021-07-03 LAB — VITAMIN D 25 HYDROXY (VIT D DEFICIENCY, FRACTURES): Vit D, 25-Hydroxy: 30.4 ng/mL (ref 30.0–100.0)

## 2021-07-03 NOTE — Progress Notes (Signed)
Hi Curtis [name]. It was nice to see you yesterday.  Your lab work looks good.  You vitamin d is barely in the normal range.  I recommend starting Vitamin D 1000 IU over the counter daily and getting the sleep study to see if your symptoms improve.  Continue with your current medication regimen.  Follow up as discussed.  Please let me know if you have any questions.   The referral coordinator said the sleep lab was supposed to be contacting you about getting the in home sleep study set up.

## 2021-07-04 ENCOUNTER — Encounter: Payer: Self-pay | Admitting: Nurse Practitioner

## 2021-07-15 ENCOUNTER — Encounter
Payer: BC Managed Care – PPO | Attending: Physical Medicine & Rehabilitation | Admitting: Physical Medicine & Rehabilitation

## 2021-07-15 ENCOUNTER — Encounter: Payer: Self-pay | Admitting: Physical Medicine & Rehabilitation

## 2021-07-15 VITALS — BP 114/87 | HR 71 | Ht 72.0 in | Wt 326.8 lb

## 2021-07-15 DIAGNOSIS — G464 Cerebellar stroke syndrome: Secondary | ICD-10-CM

## 2021-07-15 DIAGNOSIS — I63542 Cerebral infarction due to unspecified occlusion or stenosis of left cerebellar artery: Secondary | ICD-10-CM

## 2021-07-15 NOTE — Patient Instructions (Signed)

## 2021-07-15 NOTE — Progress Notes (Signed)
Subjective:    Patient ID: Curtis [name], male    DOB: [birth year], 36 y.o.   MRN: [id number] 36 y.o. right-handed male with history of obesity BMI 36.62.  Per chart review lives with spouse including 26-month-old daughter.  Independent prior to admission.  Works full-time from home.  Presented to [facility] 09/09/2020 left-sided numbness headache dizziness as well as nausea x1 week.  Per report recently presented to chiropractor 2 weeks ago for regular back adjustments.  Denied having any symptoms at that time however he had some persistent on and off neck pain x3 weeks.  MRI of the brain showed a small acute stroke of the lateral left medulla.  CTA head neck irregularity of the V2 segment of the left vertebral artery with sites of up to mild to moderate stenosis.  Irregularity of the V4 left vertebral artery with sites of up to moderate/severe stenosis.  MRI of the brain follow-up more conspicuous signal abnormality in the left medulla that could possibly evolutionary change of previous stroke.  CT angiogram head and neck repeated 09/11/2020 showing persistent findings of left vertebral artery dissection extracranially and intracranially.  Slightly increased narrowing at the level of the C2 transverse foramen.  Neurology follow-up maintained on aspirin and Plavix x3 months.  Patient would require repeat vascular imaging in 3 months to determine need to continue DAPT.  Therapy evaluations completed due to patient decreased functional  Admit date: 09/13/2020 Discharge date: 09/26/2020 HPI Working still has eyestrain at ~2pm when working on computer Still has some dizziness when on the floor playing with his infant and he rolls or moves in a certain position.  He had previously had some exercises to "clear the crystals from my ears". Recently seen by primary care was told that he may have some fluid behind his eardrum. Driving without issues  Some balance issues mainly noted when standing on 1 leg.  Denies any  falls. He is independent with all his self-care and mobility.  He is able to climb steps he is able to drive.  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 Reports drinking one half drink per day he is married with a 39-year-old, 3 steps to enter home. Pain Inventory Average Pain 3 Pain Right Now 3 My pain is sharp, burning, and tingling  LOCATION OF PAIN  wrist, hand, fingers  BOWEL Number of stools per week: 5 Oral laxative use No  Type of laxative . Enema or suppository use No  History of colostomy No  Incontinent No   BLADDER Normal In and out cath, frequency . Able to self cath  . Bladder incontinence No  Frequent urination No  Leakage with coughing No  Difficulty starting stream No  Incomplete bladder emptying No    Mobility walk without assistance how many minutes can you walk? unlimited ability to climb steps?  yes do you drive?  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  no  Physicians involved in your care Any changes since last visit?  no   Family History  Problem Relation Age of Onset   COPD Maternal Grandmother    Arthritis Maternal Grandmother    Alcohol abuse Maternal Grandfather    Heart attack Maternal Grandfather    Social History   Socioeconomic History   Marital status: Married  Spouse name: Not on file   Number of children: Not on file   Years of education: Not on file   Highest education level: Not on file  Occupational History   Not on file  Tobacco Use   Smoking status: Former    Types: Cigars   Smokeless tobacco: Never  Vaping Use   Vaping Use: Former  Substance and Sexual Activity   Alcohol use: Yes    Alcohol/week: 2.0 standard drinks of alcohol    Types: 2 Shots of liquor per week   Drug use: Never   Sexual activity: Yes  Other  Topics Concern   Not on file  Social History Narrative   Not on file   Social Determinants of Health   Financial Resource Strain: Not on file  Food Insecurity: Not on file  Transportation Needs: Not on file  Physical Activity: Not on file  Stress: Not on file  Social Connections: Not on file   Past Surgical History:  Procedure Laterality Date   GALLBLADDER SURGERY  01/23/2021   removed   Past Medical History:  Diagnosis Date   Acute cerebrovascular accident (CVA) of medulla oblongata (HCC) 09/09/2020   a.) small acute infarct of the lateral LEFT medulla in setting of LEFT vertebral artery dissection.   Anxiety    ASD (atrial septal defect) 09/14/2020   a.) TTE 09/14/2020: EF 65-70%; (+) IAS noted on color flow Doppler.   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   BP 114/87   Pulse 71   Ht 6' (1.829 m)   Wt (!) 326 lb 12.8 oz (148.2 kg)   SpO2 97%   BMI 44.32 kg/m   Opioid Risk Score:   Fall Risk Score:  `1  Depression screen [facility] 2/9     07/15/2021   12:25 PM 07/02/2021    8:55 AM 04/28/2021    9:14 AM 03/26/2021    9:25 AM 01/14/2021   10:18 AM 12/23/2020    9:43 AM 11/14/2020    8:18 AM  Depression screen PHQ 2/9  Decreased Interest 0 0 0 0 0 0 0  Down, Depressed, Hopeless 0 0 1 0 0 0 0  PHQ - 2 Score 0 0 1 0 0 0 0  Altered sleeping  3 0 3  0 1  Tired, decreased energy  1 2 3  1 1   Change in appetite  2 0 2  1 0  Feeling bad or failure about yourself   0 0 0  0 0  Trouble concentrating  0 2 0  1 1  Moving slowly or fidgety/restless  0 1 0  0 0  Suicidal thoughts  0 0 0  0 0  PHQ-9 Score  6 6 8  3 3   Difficult doing work/chores  Not difficult at all Not difficult at all Not difficult at all  Not difficult at all Somewhat difficult     Review of Systems  Respiratory:  Positive for cough.   Gastrointestinal:  Positive for nausea.  Neurological:  Positive for dizziness and weakness.  Psychiatric/Behavioral:  Positive for sleep  disturbance.   All other systems reviewed and are negative.     Objective:   Physical Exam Vitals and nursing note reviewed.  Constitutional:      Appearance: He is obese.  HENT:     Head: Normocephalic and atraumatic.  Eyes:     General: No visual field deficit.    Extraocular Movements: Extraocular movements  intact.     Conjunctiva/sclera: Conjunctivae normal.     Pupils: Pupils are equal, round, and reactive to light.  Musculoskeletal:     Right lower leg: No edema.     Left lower leg: No edema.     Comments: No pain with upper extremity or lower extremity range of motion No evidence of joint swelling at the knees Right wrist full range of motion no evidence of synovitis or erythema.  Skin:    General: Skin is warm and dry.  Neurological:     Mental Status: He is alert and oriented to person, place, and time.     Cranial Nerves: No dysarthria or facial asymmetry.     Sensory: Sensory deficit present.     Motor: No weakness, tremor or abnormal muscle tone.     Coordination: Coordination is intact.     Gait: Tandem walk abnormal.     Comments: Motor strength is 5/5 bilateral deltoid, bicep, tricep, grip, hip flexor, knee extensor, ankle dorsiflexor and plantar flexor Sensation reduced to pinprick in the right upper extremity No pinprick deficit in the facial area bilaterally No pinprick deficit in the right lower extremity Cerebellar negative ataxia finger-nose-finger bilaterally Cranial nerves, partial INO noted during lateral gaze otherwise no extraocular muscle weakness noted. Standard Romberg is negative Sharpened Romberg is positive  Psychiatric:        Mood and Affect: Mood normal.        Behavior: Behavior normal.           Assessment & Plan:  1.  Left lateral medullary infarct he has residual right hemisensory deficit to pinprick in the right upper extremity mild balance deficit as well as extraocular muscle weakness. He will follow-up with  neuro-ophthalmology. We discussed Dash diet in terms of both weight loss and stroke prevention We discussed exercise in that same vein.  Recommend 150 minutes of walking per week Discussed prognosis for further functional improvement and recovery. We discussed return to sports in context of rehabilitation.  He specifically has been trying to resume golf.  We discussed the complexity of motion and practice would definitely be helpful.  Possibly a golf lesson to have someone look at his technique as well. Physical medicine and rehab follow-up in 6 months

## 2021-07-21 DIAGNOSIS — G4733 Obstructive sleep apnea (adult) (pediatric): Secondary | ICD-10-CM | POA: Diagnosis not present

## 2021-08-05 ENCOUNTER — Encounter: Payer: Self-pay | Admitting: Nurse Practitioner

## 2021-08-05 ENCOUNTER — Ambulatory Visit (INDEPENDENT_AMBULATORY_CARE_PROVIDER_SITE_OTHER): Payer: BC Managed Care – PPO | Admitting: Adult Health

## 2021-08-05 ENCOUNTER — Encounter: Payer: Self-pay | Admitting: Adult Health

## 2021-08-05 VITALS — BP 132/80 | HR 87 | Ht 71.0 in | Wt 325.0 lb

## 2021-08-05 DIAGNOSIS — R42 Dizziness and giddiness: Secondary | ICD-10-CM

## 2021-08-05 DIAGNOSIS — I639 Cerebral infarction, unspecified: Secondary | ICD-10-CM | POA: Diagnosis not present

## 2021-08-05 MED ORDER — ONDANSETRON 4 MG PO TBDP: 4.0000 mg | ORAL_TABLET | Freq: Three times a day (TID) | ORAL | 5 refills | Status: DC | PRN

## 2021-08-05 MED ORDER — GABAPENTIN 300 MG PO CAPS: 300.0000 mg | ORAL_CAPSULE | Freq: Every day | ORAL | 5 refills | Status: DC

## 2021-08-05 NOTE — Progress Notes (Signed)
[facility] [address] [place]. [place] [address] [phone]       STROKE FOLLOW UP NOTE  [name] Date of Birth:  1985/03/06 Medical Record Number:  [id number]   Reason for Referral:  stroke follow up    SUBJECTIVE:   CHIEF COMPLAINT:  Chief Complaint  Patient presents with   Follow-up    Rm 3 with spouse Curtis [name]  Pt is well, states he is getting more vertigo and nausea. Still having vision complications but he see's ophthalmology Friday.      HPI:    Update 08/05/2021 JM: Patient returns for 72-month stroke follow-up accompanied by his wife.  Overall stable without new stroke/TIA symptoms.  Reports continued visual impairment that can worsen towards the end of the day with eye fatigue or strain, scheduled to see neuro-ophthalmology Dr. Frederico Hamman Friday. C/o right hand dysesthesias which has become more present especially with holding/lifting heavy objects. Continued vertigo issues especially with quick head movements, quick position changes or when laying on the floor playing with his daughter, can cause nausea after that can last a couple of hours. Continued imbalance greater with fatigue or ambulation on uneven ground.  He has been trying to increase his activity, trying to stay active and returning to playing golf.  Continues to maintain ADLs and IADLs independently as well as driving and working. Compliant on aspirin and atorvastatin, denies side effects.  Blood pressure today 132/80. Completed HST last week at [facility] - currently awaiting results.  No further concerns at this time.      History provided for reference purposes only Update 02/03/2021 JM: Returns for 39-month stroke follow-up unaccompanied. Overall stable. Denies new stroke/TIA symptoms. Residual visual difficulties: worse vision when looking side to side (L>R), poor focus after focusing for too long, intermittent diplopia at end of day or watching something for too long - has appt  with Dr. Frederico Hamman neuro-ophthalmology this afternoon. Residual RUE altered sensory stable without much improvement and occasional right hand stiffness sensation - improving.  Residual imbalance but gradually improving. Completed PT 12/21. Returned back to work 1/3 and previously returned back to driving - does fatigue quick but his work has been allowing him to slowly return back.  Completed 3 months DAPT - as on aspirin but held with recent gallbladder removal procedure and not yet restarted. Stopped atorvastatin as he believes was only needed for 3 mo duration - no side effects while on. Recent lipid panel 11/2020 showed LDL 103.  Blood pressure today 143/94 - routinely monitors at home and typically 120s/80s. No further concerns at this time.   Update 10/17/2020 JM: Curtis [name] is being seen for hospital follow-up accompanied by his wife.  He has been doing well since discharge.  Reports continued improvement of residual diplopia, RUE sensory deficit and gait impairment.  Currently working with therapies.  Continued use of eye patch over left eye.  Occasional use of RW but today was able to ambulate with AD but was assisted by his wife.  Reports RUE sensory improving and starting to experience a tingling sensation.  He continues to experience left-sided neck pain and left-sided headache (headache present with increased neck pain). C/o neck pain PTA with wife concerned regarding worsening after his stroke.  He is currently being followed by Hulan Saas, DO orthopedic.  Feels like neck pain is worse in the morning upon awakening and in the evening.  He remains on short-term disability.  Denies new stroke/TIA symptoms.  Remains on aspirin  and Plavix with mild bruising but no bleeding.  Remains on atorvastatin 40 mg daily without side effects.  Blood pressure today 128/88.  No further concerns at this time.  Stroke admission 09/09/2020 Curtis [name] is a 36 year old male w/pmh of DJD, neck pain and headache  who presented to [facility] on 09/09/2020 with dizziness, vertigo, nausea and leaning towards the left.  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 He was discharged to [facility] CIR on 09/13/2020.  Neurology consulted at [facility] due to recent stroke.  Evaluated by Dr. Roda Shutters who recommended continuation of aspirin and Plavix for 3 months and follow-up with repeat CT head/neck 2-3 months.  EF 65 to 70% however ASD by color Doppler flow likely to be asymptomatic incidental finding and not associated with current stroke which was felt most likely due to VA dissection from neck manipulation.  He was advised to follow-up with cardiology outpatient for further management.  A1c 5.4.  LDL 67. Recommended continued participation with PT/OT at CIR with residual diplopia, left-sided numbness and headache.      PERTINENT IMAGING  CT HEAD 09/09/2020 IMPRESSION: CT head: 1. No evidence of acute intracranial abnormality. 2. Paranasal sinus disease, as described. Correlate for acute sinusitis.    CT ANGIO HEAD NECK  IMPRESSION 12/27/2020 1. Essentially normalized appearance of the left vertebral artery. No evidence of baseline vasculopathy. 2. Extensive sinus opacification and bronchial airway thickening.  IMPRESSION: 09/09/2020 1. Irregularity of the V2 segment of the left vertebral artery with sites of up to mild/moderate stenosis. Irregularity of the V4 left vertebral artery with sites of up to moderate/severe stenosis. These findings are highly suspicious for left vertebral artery dissection given the provided history. A brain MRI is recommended to assess for acute intracranial ischemia. 2. Elsewhere within the head and neck, there is no large vessel occlusion or proximal high-grade arterial stenosis. 3. Aberrant right subclavian artery. The right subclavian artery origin is incompletely included in the field of view.  IMPRESSION 09/11/2020 IMPRESSION: No  acute intracranial abnormality. Small infarct of medulla is better seen on prior MRI.   Persistent findings of left vertebral artery dissection extracranially and intracranially. Slightly increased narrowing at the level of the C2 transverse foramen. Remains up to severe stenosis intracranially.   MR BRAIN WO CONTRAST  IMPRESSION: 09/09/2020 Small acute stroke of the lateral left medulla.  IMPRESSION: 09/11/2020 More conspicuous signal abnormality in the left medulla that could possibly be evolutionary change of the previous stroke, though some extension is not excluded. Petechial blood products in the region without measurable hematoma.   MR CERVICAL SPINE WO CONTRAST 09/09/2020 IMPRESSION: No abnormal cord signal.  No canal or foraminal stenosis.          ROS:   14 system review of systems performed and negative with exception of those listed in HPI  PMH:  Past Medical History:  Diagnosis Date   Acute cerebrovascular accident (CVA) of medulla oblongata (HCC) 09/09/2020   a.) small acute infarct of the lateral LEFT medulla in setting of LEFT vertebral artery dissection.   Anxiety    ASD (atrial septal defect) 09/14/2020   a.) TTE 09/14/2020: EF 65-70%; (+) IAS noted on color flow Doppler.   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    PSH:  Past Surgical History:  Procedure Laterality Date   GALLBLADDER SURGERY  01/23/2021   removed    Social History:  Social  History   Socioeconomic History   Marital status: Married    Spouse name: Not on file   Number of children: Not on file   Years of education: Not on file   Highest education level: Not on file  Occupational History   Not on file  Tobacco Use   Smoking status: Former    Types: Cigars   Smokeless tobacco: Never  Vaping Use   Vaping Use: Former  Substance and Sexual Activity   Alcohol use: Yes    Alcohol/week: 2.0 standard drinks of alcohol    Types: 2 Shots of liquor  per week   Drug use: Never   Sexual activity: Yes  Other Topics Concern   Not on file  Social History Narrative   Not on file   Social Determinants of Health   Financial Resource Strain: Not on file  Food Insecurity: Not on file  Transportation Needs: Not on file  Physical Activity: Not on file  Stress: Not on file  Social Connections: Not on file  Intimate Partner Violence: Not on file    Family History:  Family History  Problem Relation Age of Onset   COPD Maternal Grandmother    Arthritis Maternal Grandmother    Alcohol abuse Maternal Grandfather    Heart attack Maternal Grandfather     Medications:   Current Outpatient Medications on File Prior to Visit  Medication Sig Dispense Refill   aspirin EC 81 MG tablet Take 81 mg by mouth daily. Swallow whole.     atorvastatin (LIPITOR) 20 MG tablet Take 1 tablet (20 mg total) by mouth daily. 90 tablet 1   FLUoxetine (PROZAC) 40 MG capsule Take 1 capsule (40 mg total) by mouth daily. 90 capsule 1   fluticasone (FLONASE) 50 MCG/ACT nasal spray Place 2 sprays into both nostrils daily.     omeprazole (PRILOSEC) 20 MG capsule Take 1 capsule (20 mg total) by mouth daily. 30 capsule 3   No current facility-administered medications on file prior to visit.    Allergies:  No Known Allergies    OBJECTIVE:  Physical Exam  Vitals:   08/05/21 1303  BP: 132/80  Pulse: 87  Weight: (!) 325 lb (147.4 kg)  Height: 5\' 11"  (1.803 m)   Body mass index is 45.33 kg/m. No results found.   General: well developed, well nourished, very pleasant young Caucasian male, seated, in no evident distress Head: head normocephalic and atraumatic.   Neck: supple with no carotid or supraclavicular bruits Cardiovascular: regular rate and rhythm, no murmurs Musculoskeletal: no deformity Skin:  no rash/petichiae Vascular:  Normal pulses all extremities   Neurologic Exam Mental Status: Awake and fully alert. Fluent speech and language. Oriented  to place and time. Recent and remote memory intact. Attention span, concentration and fund of knowledge appropriate. Mood and affect appropriate.  Cranial Nerves: Pupils equal, briskly reactive to light. Extraocular movements persistent nystagmus worsening when looking towards left.  Visual fields full to confrontation. Hearing intact. Decreased facial sensory. Face, tongue, palate moves normally and symmetrically.  Motor: Normal bulk and tone. Normal strength in all tested extremity muscles  Sensory.: intact to touch , pinprick , position and vibratory sensation except RUE decreased light touch, temp. and pinprick sensation distally Coordination: Rapid alternating movements normal in all extremities. Finger-to-nose and heel-to-shin performed accurately bilaterally. Gait and Station: Arises from chair without difficulty. Stance is normal. Gait demonstrates normal stride length and balance without use of AD.  Tandem walk and heel toe with mild  difficulty. Romberg - mild swaying towards right  Reflexes: 1+ and symmetric. Toes downgoing.        ASSESSMENT: CHANDLAR GUICE is a 36 y.o. 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 Work up completed at [facility] and was evaluated by Dr. Roda Shutters during [facility] CIR admission.  Vascular risk factors include DJD.      PLAN:  Left lateral medullary stroke:  Residual deficit: RUE sensory impairment with dysesthesias, visual impairment, vertigo and imbalance.   Patient concerned of some worsening but likely more in setting of trying to be more active. Neuro exam today stable compared to prior visit.   Referral placed to NR PT for vertigo and imbalance.   Trial zofran PRN to help with nausea associated with vertigo.   Trial gabapentin 300 mg nightly for right hand dysesthesias, also discussed use of compression glove for possible benefit.   Continue to follow with neuro-ophthalmology and PMR Continue aspirin 81 mg  daily and atorvastatin for secondary stroke prevention. Recommend life long use unless contraindicated in the future for secondary stroke prevention.  Defer management/rx refills to PCP Discussed secondary stroke prevention measures and importance of close PCP follow up for aggressive stroke risk factor management including HLD with LDL goal<70.  Lipid panel 06/2021: LDL 90 - if levels remain above goal on repeat testing, would recommend increasing dosage I have gone over the pathophysiology of stroke, warning signs and symptoms, risk factors and their management in some detail with instructions to go to the closest emergency room for symptoms of concern. VA dissection:  No indication for repeat imaging as essentially stable and prior imaging 6 months ago showed resolution of dissection. Advised if he starts to experience persistent vertigo symptoms, visual changes, N/V, or any other significant worsening/new neurological symptoms to proceed to ED immediately for further evaluation Repeat CTA head/neck 12/2020 showed essentially normalized appearance of L VA CTA head/neck 08/2020 irregularity of L V2 and V4 highly suspicious for L VA dissection avoid contact sports, chiropractic neck manipulation, and any activity that involves abrupt rotation and flexion/extension of neck and close monitoring of blood pressure to avoid hypotension Likely in setting of neck manipulation from chiropractor treatments      Follow up in 6 months or call earlier if needed - if remains stable, can discuss f/u as needed   CC:  PCP: Larae Grooms, NP    I spent 36 minutes of face-to-face and non-face-to-face time with patient and wife.  This included previsit chart review, lab review, study review, order entry, electronic health record documentation, patient and wife's education regarding prior stroke including etiology, VA dissection precautions, secondary stroke prevention measures and importance of managing stroke  risk factors, residual deficits and possible further recovery, and answered all other questions to patient and wife's satisfaction  Curtis [name], AGNP-BC  [facility] [address] [place], [place] [address]  Phone [phone] Fax [phone] Note: This document was prepared with digital dictation and possible smart phrase technology. Any transcriptional errors that result from this process are unintentional.

## 2021-08-05 NOTE — Patient Instructions (Addendum)
Restart gabapentin 300mg  nightly for nerve pain - please let me know if no benefit after 1-2 weeks for possible need of dosage increase or sooner if any difficulty tolerating   Try compression glove to see if this helps with hand pain - these can be found on [facility], pharmacies or medical supply store   Referral placed to PT for worsening vertigo symptoms. Can also use zofran as needed   Continue aspirin 81 mg daily  and atorvastatin 20mg  daily  for secondary stroke prevention  Continue to follow up with PCP regarding cholesterol management  Maintain strict control of cholesterol with LDL cholesterol (bad cholesterol) goal below 70 mg/dL.   Signs of a Stroke? Follow the BEFAST method:  Balance Watch for a sudden loss of balance, trouble with coordination or vertigo Eyes Is there a sudden loss of vision in one or both eyes? Or double vision?  Face: Ask the person to smile. Does one side of the face droop or is it numb?  Arms: Ask the person to raise both arms. Does one arm drift downward? Is there weakness or numbness of a leg? Speech: Ask the person to repeat a simple phrase. Does the speech sound slurred/strange? Is the person confused ? Time: If you observe any of these signs, call 911.      Followup in the future with me in 6 months or call earlier if needed       Thank you for coming to see [facility] at [facility]. I hope we have been able to provide you high quality care today.  You may receive a patient satisfaction survey over the next few weeks. We would appreciate your feedback and comments so that we may continue to improve ourselves and the health of our patients.

## 2021-08-08 DIAGNOSIS — H518 Other specified disorders of binocular movement: Secondary | ICD-10-CM | POA: Diagnosis not present

## 2021-08-08 DIAGNOSIS — H538 Other visual disturbances: Secondary | ICD-10-CM | POA: Diagnosis not present

## 2021-08-08 DIAGNOSIS — H532 Diplopia: Secondary | ICD-10-CM | POA: Diagnosis not present

## 2021-08-08 NOTE — Progress Notes (Unsigned)
[name] [address] [place] [address] Phone: [phone] Subjective:   Bruce Donath, am serving as a scribe for Dr. Antoine Primas.   I'm seeing this patient by the request  of:  Larae Grooms, NP  CC: Back pain follow-up  ATF:[id number]  Curtis [name] is a 36 y.o. male coming in with complaint of back and neck pain. OMT on 06/12/2021. Patient states that he has catching in lower back with lumbar flexion.   Medications patient has been prescribed: None  Taking:         Reviewed prior external information including notes and imaging from previsou exam, outside providers and external EMR if available.   As well as notes that were available from care everywhere and other healthcare systems.  Past medical history, social, surgical and family history all reviewed in electronic medical record.  No pertanent information unless stated regarding to the chief complaint.   Past Medical History:  Diagnosis Date   Acute cerebrovascular accident (CVA) of medulla oblongata (HCC) 09/09/2020   a.) small acute infarct of the lateral LEFT medulla in setting of LEFT vertebral artery dissection.   Anxiety    ASD (atrial septal defect) 09/14/2020   a.) TTE 09/14/2020: EF 65-70%; (+) IAS noted on color flow Doppler.   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 POSITIVE muscle aches  Objective  Blood pressure 126/86, pulse 77, height 5\' 11"  (1.803 m), weight (!) 325 lb (147.4 kg), SpO2 98 %.   General: No apparent distress alert and oriented x3 mood and affect normal, dressed appropriately.  HEENT: Pupils equal, extraocular  movements intact  Respiratory: Patient's speak in full sentences and does not appear short of breath  Cardiovascular: No lower extremity edema, non tender, no erythema  Gait MSK:  Back has some loss of lordosis.  Still poor core strength noted.  More tightness in the thoracolumbar junction  Osteopathic findings T3 extended rotated and side bent right inhaled rib T8 extended rotated and side bent left inhaled rib  L3 flexed rotated and side bent right Sacrum right on right     Assessment and Plan:  Thoracic back pain Increasing tightness recently.  Patient was sleeping on the couch, patient was traveling a lot.  Likely exacerbated.  [place] 52[place]year[place]  Patient did respond well to osteopathic manipulation.  Discussed posture and ergonomics.  Discussed which activities to do and which ones to avoid.  Increase activity slowly otherwise.  Follow-up with me again in 6 to 8 weeks.    Nonallopathic problems  Decision today to treat with OMT was based on Physical Exam  After verbal consent patient was treated with HVLA, ME, FPR techniques in  thoracic, lumbar, and sacral  areas  Patient tolerated the procedure well with improvement in symptoms  Patient given exercises, stretches and lifestyle modifications  See medications in patient instructions if given  Patient will follow up in 4-8 weeks     The above documentation has been reviewed and is accurate and complete 2, DO         Note: This dictation was prepared with Dragon dictation along  with smaller phrase technology. Any transcriptional errors that result from this process are unintentional.

## 2021-08-11 ENCOUNTER — Ambulatory Visit (INDEPENDENT_AMBULATORY_CARE_PROVIDER_SITE_OTHER): Payer: BC Managed Care – PPO | Admitting: Family Medicine

## 2021-08-11 VITALS — BP 126/86 | HR 77 | Ht 71.0 in | Wt 325.0 lb

## 2021-08-11 DIAGNOSIS — M546 Pain in thoracic spine: Secondary | ICD-10-CM

## 2021-08-11 DIAGNOSIS — M9902 Segmental and somatic dysfunction of thoracic region: Secondary | ICD-10-CM

## 2021-08-11 DIAGNOSIS — G8929 Other chronic pain: Secondary | ICD-10-CM

## 2021-08-11 DIAGNOSIS — M9903 Segmental and somatic dysfunction of lumbar region: Secondary | ICD-10-CM

## 2021-08-11 DIAGNOSIS — M9904 Segmental and somatic dysfunction of sacral region: Secondary | ICD-10-CM

## 2021-08-11 NOTE — Assessment & Plan Note (Signed)
Increasing tightness recently.  Patient was sleeping on the couch, patient was traveling a lot.  Likely exacerbated.  I am [place] of his 34-year-old [place].  Patient did respond well to osteopathic manipulation.  Discussed posture and ergonomics.  Discussed which activities to do and which ones to avoid.  Increase activity slowly otherwise.  Follow-up with me again in 6 to 8 weeks.
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
## 2021-09-19 NOTE — Progress Notes (Unsigned)
  [name] [address] [place] [address] Phone: [phone] Subjective:   [name], am serving as a scribe for Dr. Antoine Primas.  I'm seeing this patient by the request  of:  Larae Grooms, NP  CC: back and neck pain   FVO:[id number]  Curtis [name] is a 35 y.o. male coming in with complaint of back and neck pain. OMT on 08/11/2021. Patient states doing well. Same per usual. No new complaints  Medications patient has been prescribed: None  Taking:         Reviewed prior external information including notes and imaging from previsou exam, outside providers and external EMR if available.   As well as notes that were available from care everywhere and other healthcare systems.  Past medical history, social, surgical and family history all reviewed in electronic medical record.  No pertanent information unless stated regarding to the chief complaint.   Past Medical History:  Diagnosis Date   Acute cerebrovascular accident (CVA) of medulla oblongata (HCC) 09/09/2020   a.) small acute infarct of the lateral LEFT medulla in setting of LEFT vertebral artery dissection.   Anxiety    ASD (atrial septal defect) 09/14/2020   a.) TTE 09/14/2020: EF 65-70%; (+) IAS noted on color flow Doppler.   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 POSITIVE muscle aches  Objective  There were no vitals taken for this visit.   General: No apparent distress alert and oriented x3 mood and affect normal, dressed appropriately.  HEENT: Pupils equal, extraocular movements intact  Respiratory: Patient's speak in full sentences and does  not appear short of breath  Cardiovascular: No lower extremity edema, non tender, no erythema  Gait MSK:  Back   Osteopathic findings  T3 extended rotated and side bent right inhaled rib T8 extended rotated and side bent left L2 flexed rotated and side bent right Sacrum right on right     Assessment and Plan:  No problem-specific Assessment & Plan notes found for this encounter.    Nonallopathic problems  Decision today to treat with OMT was based on Physical Exam  After verbal consent patient was treated with HVLA, ME, FPR techniques in cervical, rib, thoracic, lumbar, and sacral  areas  Patient tolerated the procedure well with improvement in symptoms  Patient given exercises, stretches and lifestyle modifications  See medications in patient instructions if given  Patient will follow up in 4-8 weeks    The above documentation has been reviewed and is accurate and complete [name], DO          Note: This dictation was prepared with Dragon dictation along with smaller phrase technology. Any transcriptional errors that result from this process are unintentional.

## 2021-09-22 ENCOUNTER — Ambulatory Visit (INDEPENDENT_AMBULATORY_CARE_PROVIDER_SITE_OTHER): Payer: BC Managed Care – PPO | Admitting: Family Medicine

## 2021-09-22 VITALS — BP 132/88 | HR 79 | Ht 71.0 in | Wt 323.0 lb

## 2021-09-22 DIAGNOSIS — M9904 Segmental and somatic dysfunction of sacral region: Secondary | ICD-10-CM

## 2021-09-22 DIAGNOSIS — M9902 Segmental and somatic dysfunction of thoracic region: Secondary | ICD-10-CM | POA: Diagnosis not present

## 2021-09-22 DIAGNOSIS — M9903 Segmental and somatic dysfunction of lumbar region: Secondary | ICD-10-CM

## 2021-09-22 DIAGNOSIS — M546 Pain in thoracic spine: Secondary | ICD-10-CM

## 2021-09-22 DIAGNOSIS — G8929 Other chronic pain: Secondary | ICD-10-CM

## 2021-09-22 NOTE — Patient Instructions (Signed)
Do scapula exercises I showed you

## 2021-09-23 NOTE — Assessment & Plan Note (Signed)
Chronic but increasing but improvement discussed HEP, discussed continue weight loss.  Gabapentin if needed RTC in 6-8 weeks

## 2021-09-25 ENCOUNTER — Encounter: Payer: Self-pay | Admitting: Physical Medicine & Rehabilitation

## 2021-10-08 DIAGNOSIS — G4733 Obstructive sleep apnea (adult) (pediatric): Secondary | ICD-10-CM

## 2021-10-23 ENCOUNTER — Other Ambulatory Visit: Payer: Self-pay

## 2021-10-23 ENCOUNTER — Encounter: Payer: Self-pay | Admitting: Family Medicine

## 2021-10-23 DIAGNOSIS — R0683 Snoring: Secondary | ICD-10-CM

## 2021-10-28 ENCOUNTER — Encounter: Payer: Self-pay | Admitting: Physical Medicine & Rehabilitation

## 2021-11-11 NOTE — Progress Notes (Unsigned)
[facility] [place] Phone: [phone] Subjective:   Curtis Curtis [name], am serving as a scribe for Dr. Hulan [name].  I'm seeing this patient by the request  of:  Curtis Billings, NP  CC: Low back and neck pain  [id number]  Curtis Curtis [name] is a 36 y.o. male coming in with complaint of back and neck pain. OMT on 09/22/2021. Patient states that he is better but having some tightness on R side of lumbar spine and thoracic spine. Curtis [name] injury to this area.   Medications patient has been prescribed: None  Taking:         Reviewed prior external information including notes and imaging from previsou exam, outside providers and external EMR if available.   As well as notes that were available from care everywhere and other healthcare systems.  Past medical history, social, surgical and family history all reviewed in electronic medical record.  Curtis [name] pertanent information unless stated regarding to the chief complaint.   Past Medical History:  Diagnosis Date   Acute cerebrovascular accident (CVA) of medulla oblongata ([facility]) 09/09/2020   a.) small acute infarct of the lateral LEFT medulla in setting of LEFT vertebral artery dissection.   Anxiety    ASD (atrial septal defect) 09/14/2020   a.) TTE 09/14/2020: EF 65-70%; (+) IAS noted on color flow Doppler.   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    Curtis [name] Known Allergies   Review of Systems:  Curtis [name] headache, visual changes, nausea, vomiting, diarrhea, constipation, dizziness, abdominal pain, skin rash, fevers, chills, night sweats, weight loss, swollen lymph nodes, body aches, joint swelling, chest pain, shortness of breath, mood changes. POSITIVE muscle aches  Objective  Blood pressure (!) 128/102, pulse 88, height 6' (1.829 m), weight (!) 318 lb (144.2 kg), SpO2 96 %.   General: Curtis [name] apparent distress alert and oriented x3 mood and affect normal,  dressed appropriately.  HEENT: Pupils equal, extraocular movements intact  Respiratory: Patient's speak in full sentences and does not appear short of breath  Cardiovascular: Curtis [name] lower extremity edema, non tender, Curtis [name] erythema  Back exam does have some loss of lordosis.  Significant tightness noted in the thoracolumbar juncture than usual.  Also significant pain in the parascapular area right greater than left  Osteopathic findings  T9 extended rotated and side bent right L1 flexed rotated and side bent right Sacrum right on right       Assessment and Plan:  Thoracic back pain Mild increase in tightness but overall seems to be doing relatively well.  He does have the gabapentin at night to 100 mg if needed.  Patient does respond well though to osteopathic manipulation when we do consider it.  Continue to stay active otherwise.  Follow-up with me again in 6 to 8 weeks    Nonallopathic problems  Decision today to treat with OMT was based on Physical Exam  After verbal consent patient was treated with HVLA, ME, FPR techniques in thoracic, lumbar, and sacral  areas  Patient tolerated the procedure well with improvement in symptoms  Patient given exercises, stretches and lifestyle modifications  See medications in patient instructions if given  Patient will follow up in 6-8 weeks    The above documentation has been reviewed and is accurate and complete Curtis Pulley, DO          Note: This dictation was prepared with Dragon dictation along with smaller phrase technology.  Any transcriptional errors that result from this process are unintentional.

## 2021-11-12 ENCOUNTER — Encounter: Payer: Self-pay | Admitting: Pulmonary Disease

## 2021-11-12 ENCOUNTER — Ambulatory Visit (INDEPENDENT_AMBULATORY_CARE_PROVIDER_SITE_OTHER): Payer: BC Managed Care – PPO | Admitting: Pulmonary Disease

## 2021-11-12 VITALS — BP 120/82 | HR 77 | Temp 98.2°F | Ht 72.0 in | Wt 317.4 lb

## 2021-11-12 DIAGNOSIS — G4733 Obstructive sleep apnea (adult) (pediatric): Secondary | ICD-10-CM | POA: Diagnosis not present

## 2021-11-12 DIAGNOSIS — Z23 Encounter for immunization: Secondary | ICD-10-CM | POA: Diagnosis not present

## 2021-11-12 NOTE — Patient Instructions (Signed)
We will contact the medical supply company to set you up with a CPAP  Auto CPAP settings of 5-20 with heated humidification  It does take some time to get adjusted to using a CPAP on a regular basis  Expectation is that with better quality sleep, you should be feeling better, waking up more rejuvenated and less sleepy during the day  Continue weight loss efforts  Tentative follow-up in about 3 months-just about 1 month after you start using CPAP

## 2021-11-12 NOTE — Progress Notes (Addendum)
LEIM DIANTONIO    [id number]    [birth year]  Primary Care Physician:Holdsworth, Santiago Glad, NP  Referring Physician: Lyndal Pulley, [place],  [place] [address]  Chief complaint:   Patient being seen for obstructive sleep apnea  HPI:  Recently had a home sleep study showing severe obstructive sleep apnea with an AHI of 70  Admits to snoring, nonrestorative sleep, wakes up feeling tired Occasional dryness of his mouth in the mornings No morning headaches No night sweats No driving issues  Poor short-term memory  Usually goes to bed about 11, takes about 5 to 10 minutes to fall asleep 2-3 awakenings Final wake up time about 6:30 in the morning  Had a stroke about a year ago contributed to significant weight gain  Reformed smoker quit about last year-smoking cigars  No pertinent occupational risk, works as a Designer, jewellery   Outpatient Encounter Medications as of 11/12/2021  Medication Sig   aspirin EC 81 MG tablet Take 81 mg by mouth daily. Swallow whole.   atorvastatin (LIPITOR) 20 MG tablet Take 1 tablet (20 mg total) by mouth daily.   FLUoxetine (PROZAC) 40 MG capsule Take 1 capsule (40 mg total) by mouth daily.   fluticasone (FLONASE) 50 MCG/ACT nasal spray Place 2 sprays into both nostrils daily.   gabapentin (NEURONTIN) 300 MG capsule Take 1 capsule (300 mg total) by mouth at bedtime.   omeprazole (PRILOSEC) 20 MG capsule Take 1 capsule (20 mg total) by mouth daily.   ondansetron (ZOFRAN-ODT) 4 MG disintegrating tablet Take 1 tablet (4 mg total) by mouth every 8 (eight) hours as needed for nausea or vomiting.   No facility-administered encounter medications on file as of 11/12/2021.    Allergies as of 11/12/2021   (No Known Allergies)    Past Medical History:  Diagnosis Date   Acute cerebrovascular accident (CVA) of medulla oblongata ([facility]) 09/09/2020   a.) small acute infarct of the lateral LEFT medulla in setting of LEFT  vertebral artery dissection.   Anxiety    ASD (atrial septal defect) 09/14/2020   a.) TTE 09/14/2020: EF 65-70%; (+) IAS noted on color flow Doppler.   Heart murmur    LEFT vertebral artery dissection ([facility]) 09/09/2020    Past Surgical History:  Procedure Laterality Date   GALLBLADDER SURGERY  01/23/2021   removed    Family History  Problem Relation Age of Onset   COPD Maternal Grandmother    Arthritis Maternal Grandmother    Alcohol abuse Maternal Grandfather    Heart attack Maternal Grandfather     Social History   Socioeconomic History   Marital status: Married    Spouse name: Not on file   Number of children: Not on file   Years of education: Not on file   Highest education level: Not on file  Occupational History   Not on file  Tobacco Use   Smoking status: Former    Types: Cigars   Smokeless tobacco: Never  Vaping Use   Vaping Use: Former  Substance and Sexual Activity   Alcohol use: Yes    Alcohol/week: 2.0 standard drinks of alcohol    Types: 2 Shots of liquor per week   Drug use: Never   Sexual activity: Yes  Other Topics Concern   Not on file  Social History Narrative   Not on file   Social Determinants of Health   Financial Resource Strain: Not on file  Food  Insecurity: Not on file  Transportation Needs: Not on file  Physical Activity: Not on file  Stress: Not on file  Social Connections: Not on file  Intimate Partner Violence: Not on file    Review of Systems  Constitutional:  Positive for fatigue.  Respiratory:  Positive for apnea.   Psychiatric/Behavioral:  Positive for sleep disturbance.     Vitals:   11/12/21 0934  BP: 120/82  Pulse: 77  Temp: 98.2 F (36.8 C)  SpO2: 100%     Physical Exam Constitutional:      Appearance: He is obese.  HENT:     Mouth/Throat:     Mouth: Mucous membranes are moist.     Comments: Mallampati 3, crowded oropharynx, neck size 18 Cardiovascular:     Rate and Rhythm: Normal rate and regular  rhythm.     Heart sounds: No murmur heard.    No friction rub.  Pulmonary:     Effort: No respiratory distress.     Breath sounds: No stridor. No wheezing or rhonchi.  Musculoskeletal:     Cervical back: No rigidity or tenderness.  Neurological:     Mental Status: He is alert.  Psychiatric:        Mood and Affect: Mood normal.        No data to display         Epworth Sleepiness Scale of 6  Data Reviewed: Sleep study results from 07/21/2021 reviewed with the patient showing AHI of 70  Assessment:  Severe obstructive sleep apnea with oxygen desaturations  Class III obesity  Nonrestorative sleep  Pathophysiology of sleep disordered breathing reviewed with patient Treatment options reviewed with the patient  Plan/Recommendations: Risk of not treating sleep disordered breathing reviewed with patient  DME referral for CPAP therapy Auto CPAP 5-20 with heated humidification  Graded activities as tolerated for weight loss  Tentative follow-up in about 3 months  Encouraged to call with significant concerns  Administer flu shot today  Sherrilyn Rist MD [facility] 11/12/2021, 9:40 AM  CC: Lyndal Pulley, DO

## 2021-11-12 NOTE — Addendum Note (Signed)
Addended by: Marshia Ly R on: 11/12/2021 10:20 AM   Modules accepted: Orders

## 2021-11-13 ENCOUNTER — Encounter: Payer: Self-pay | Admitting: Family Medicine

## 2021-11-13 ENCOUNTER — Ambulatory Visit (INDEPENDENT_AMBULATORY_CARE_PROVIDER_SITE_OTHER): Payer: BC Managed Care – PPO | Admitting: Family Medicine

## 2021-11-13 VITALS — BP 128/102 | HR 88 | Ht 72.0 in | Wt 318.0 lb

## 2021-11-13 DIAGNOSIS — M9902 Segmental and somatic dysfunction of thoracic region: Secondary | ICD-10-CM | POA: Diagnosis not present

## 2021-11-13 DIAGNOSIS — M9904 Segmental and somatic dysfunction of sacral region: Secondary | ICD-10-CM | POA: Diagnosis not present

## 2021-11-13 DIAGNOSIS — M546 Pain in thoracic spine: Secondary | ICD-10-CM

## 2021-11-13 DIAGNOSIS — G8929 Other chronic pain: Secondary | ICD-10-CM

## 2021-11-13 DIAGNOSIS — M9903 Segmental and somatic dysfunction of lumbar region: Secondary | ICD-10-CM

## 2021-11-13 NOTE — Patient Instructions (Signed)
See me in 6-8 weeks 

## 2021-11-13 NOTE — Assessment & Plan Note (Signed)
Mild increase in tightness but overall seems to be doing relatively well.  He does have the gabapentin at night to 100 mg if needed.  Patient does respond well though to osteopathic manipulation when we do consider it.  Continue to stay active otherwise.  Follow-up with me again in 6 to 8 weeks

## 2021-12-15 ENCOUNTER — Encounter: Payer: Self-pay | Admitting: Pulmonary Disease

## 2021-12-17 NOTE — Progress Notes (Unsigned)
  [name] [address] [place] [address] Phone: [phone] Subjective:   Bruce Donath, am serving as a scribe for Dr. Antoine Primas.  I'm seeing this patient by the request  of:  Larae Grooms, NP  CC: low back pain   [id number]  ROLANDO HESSLING is a 36 y.o. male coming in with complaint of back and neck pain. OMT 11/13/2021. Patient states that last week he had an increase in back pain in between scapula after sneezing.   Also c/o pain in L shoulder since yesterday with adduction. Pain over top of shoulder. Patient did have some pain in shoulder post stroke but otherwise no injury to that area.   Medications patient has been prescribed: None  Taking:         Reviewed prior external information including notes and imaging from previsou exam, outside providers and external EMR if available.   As well as notes that were available from care everywhere and other healthcare systems.  Past medical history, social, surgical and family history all reviewed in electronic medical record.  No pertanent information unless stated regarding to the chief complaint.   Past Medical History:  Diagnosis Date   Acute cerebrovascular accident (CVA) of medulla oblongata ([facility]) 09/09/2020   a.) small acute infarct of the lateral LEFT medulla in setting of LEFT vertebral artery dissection.   Anxiety    ASD (atrial septal defect) 09/14/2020   a.) TTE 09/14/2020: EF 65-70%; (+) IAS noted on color flow Doppler.   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 POSITIVE muscle aches  Objective  There were no vitals taken for this visit.    General: No apparent distress alert and oriented x3 mood and affect normal, dressed appropriately.  HEENT: Pupils equal, extraocular movements intact  Respiratory: Patient's speak in full sentences and does not appear short of breath  Cardiovascular: No lower extremity edema, non tender, no erythema  Gait MSK:  Back   Osteopathic findings  C2 flexed rotated and side bent right C6 flexed rotated and side bent left T3 extended rotated and side bent right inhaled rib T9 extended rotated and side bent left L2 flexed rotated and side bent right Sacrum right on right       Assessment and Plan:  No problem-specific Assessment & Plan notes found for this encounter.    Nonallopathic problems  Decision today to treat with OMT was based on Physical Exam  After verbal consent patient was treated with HVLA, ME, FPR techniques in cervical, rib, thoracic, lumbar, and sacral  areas  Patient tolerated the procedure well with improvement in symptoms  Patient given exercises, stretches and lifestyle modifications  See medications in patient instructions if given  Patient will follow up in 4-8 weeks     The above documentation has been reviewed and is accurate and complete [name], DO         Note: This dictation was prepared with Dragon dictation along with smaller phrase technology. Any transcriptional errors that result from this process are unintentional.

## 2021-12-24 ENCOUNTER — Ambulatory Visit (INDEPENDENT_AMBULATORY_CARE_PROVIDER_SITE_OTHER): Payer: BC Managed Care – PPO | Admitting: Family Medicine

## 2021-12-24 ENCOUNTER — Encounter: Payer: Self-pay | Admitting: Family Medicine

## 2021-12-24 VITALS — BP 118/68 | HR 82 | Ht 72.0 in | Wt 318.0 lb

## 2021-12-24 DIAGNOSIS — M9902 Segmental and somatic dysfunction of thoracic region: Secondary | ICD-10-CM | POA: Diagnosis not present

## 2021-12-24 DIAGNOSIS — G8929 Other chronic pain: Secondary | ICD-10-CM | POA: Diagnosis not present

## 2021-12-24 DIAGNOSIS — M546 Pain in thoracic spine: Secondary | ICD-10-CM

## 2021-12-24 DIAGNOSIS — M9903 Segmental and somatic dysfunction of lumbar region: Secondary | ICD-10-CM

## 2021-12-24 DIAGNOSIS — M9904 Segmental and somatic dysfunction of sacral region: Secondary | ICD-10-CM

## 2021-12-24 NOTE — Patient Instructions (Signed)
Keep hands in peripheral vision Exercises See me again in 2 months

## 2021-12-25 NOTE — Assessment & Plan Note (Signed)
Pain more in the thoracolumbar junction.  We discussed again about posture and ergonomics.  Patient is going to attempt to workout on a more regular basis.  Discussed icing home regimen and home exercises.  Increase activity slowly.  Follow-up again in 6 to 8 weeks

## 2021-12-31 NOTE — Progress Notes (Unsigned)
There were no vitals taken for this visit.   Subjective:    Patient ID: Curtis [name], male    DOB: [birth year], 36 y.o.   MRN: [id number]  HPI: Curtis [name] is a 36 y.o. male presenting on 01/01/2022 for comprehensive medical examination. Current medical complaints include:none  He currently lives with: Interim Problems from his last visit: no  Presented to [facility] 09/09/2020 left-sided numbness headache dizziness as well as nausea x1 week.  Patient had been seeing the MRI of the brain showed a small acute stroke of the lateral left medulla.  CTA head neck irregularity of the V2 segment of the left vertebral artery with sites of up to mild to moderate stenosis.  Irregularity of the V4 left vertebral artery with sites of up to moderate/severe stenosis.  MRI of the brain follow-up more conspicuous signal abnormality in the left medulla that could possibly evolutionary change of previous stroke.  Neurology follow-up maintained on aspirin and Plavix x3 months.  Patient would require repeat vascular imaging in 3 months to determine need to continue DAPT.   Patient has been doing PT and OT to 2x weekly.  Yesterday they cut his OT back to 1x weekly.  Patient states the weakness has improved significantly since doing OT.  Patient states it feels like their is a layer of something over his skin.  He can't completely feel things. Patient states he is still having dizziness.  He is taking Meclizine 90m TID.  He does have some vision issues.  He is following up with opthalmology today.    Patient states he is getting better everyday.  He has stopped taking the Gabapentin.   He does have some numbness and tingling in the right arm.    Depression Screen done today and results listed below:     07/15/2021   12:25 PM 07/02/2021    8:55 AM 04/28/2021    9:14 AM 03/26/2021    9:25 AM 01/14/2021   10:18 AM  Depression screen PHQ 2/9  Decreased Interest 0 0 0 0 0  Down, Depressed, Hopeless 0 0 1 0 0  PHQ - 2  Score 0 0 1 0 0  Altered sleeping  3 0 3   Tired, decreased energy  _0 Change in appetite  2 0 2   Feeling bad or failure about yourself   0 0 0   Trouble concentrating  0 2 0   Moving slowly or fidgety/restless  0 1 0   Suicidal thoughts  0 0 0   PHQ-9 Score  _1 Difficult doing work/chores  Not difficult at all Not difficult at all Not difficult at all     The patient has a history of falls. I did complete a risk assessment for falls. A plan of care for falls was documented.   Past Medical History:  Past Medical History:  Diagnosis Date  . [place] cerebrovascular [place]) [place]   a.) small acute infarct of the lateral LEFT medulla in setting of LEFT vertebral artery dissection.  .Marland KitchenAnxiety   . ASD (atrial septal defect) 09/14/2020   a.) TTE 09/14/2020: EF 65-70%; (+) IAS noted on color flow Doppler.  .Marland KitchenHeart murmur   . [place]    Surgical History:  Past Surgical History:  Procedure Laterality Date  . GALLBLADDER SURGERY  01/23/2021   removed    Medications:  Current Outpatient  Medications on File Prior to Visit  Medication Sig  . aspirin EC 81 MG tablet Take 81 mg by mouth daily. Swallow whole.  Marland Kitchen atorvastatin (LIPITOR) 20 MG tablet Take 1 tablet (20 mg total) by mouth daily.  Marland Kitchen FLUoxetine (PROZAC) 40 MG capsule Take 1 capsule (40 mg total) by mouth daily.  . fluticasone (FLONASE) 50 MCG/ACT nasal spray Place 2 sprays into both nostrils daily.  Marland Kitchen gabapentin (NEURONTIN) 300 MG capsule Take 1 capsule (300 mg total) by mouth at bedtime.  Marland Kitchen omeprazole (PRILOSEC) 20 MG capsule Take 1 capsule (20 mg total) by mouth daily.  . ondansetron (ZOFRAN-ODT) 4 MG disintegrating tablet Take 1 tablet (4 mg total) by mouth every 8 (eight) hours as needed for nausea or vomiting.   No current facility-administered medications on file prior to visit.    Allergies:  No Known Allergies  Social History:   Social History   Socioeconomic History  . Marital status: Married    Spouse name: Not on file  . Number of children: Not on file  . Years of education: Not on file  . Highest education level: Not on file  Occupational History  . Not on file  Tobacco Use  . Smoking status: Former    Types: Cigars  . Smokeless tobacco: Never  Vaping Use  . Vaping Use: Former  Substance and Sexual Activity  . Alcohol use: Yes    Alcohol/week: 2.0 standard drinks of alcohol    Types: 2 Shots of liquor per week  . Drug use: Never  . Sexual activity: Yes  Other Topics Concern  . Not on file  Social History Narrative  . Not on file   Social Determinants of Health   Financial Resource Strain: Not on file  Food Insecurity: Not on file  Transportation Needs: Not on file  Physical Activity: Not on file  Stress: Not on file  Social Connections: Not on file  Intimate Partner Violence: Not on file   Social History   Tobacco Use  Smoking Status Former  . Types: Cigars  Smokeless Tobacco Never   Social History   Substance and Sexual Activity  Alcohol Use Yes  . Alcohol/week: 2.0 standard drinks of alcohol  . Types: 2 Shots of liquor per week    Family History:  Family History  Problem Relation Age of Onset  . COPD Maternal Grandmother   . Arthritis Maternal Grandmother   . Alcohol abuse Maternal Grandfather   . Heart attack Maternal Grandfather     Past medical history, surgical history, medications, allergies, family history and social history reviewed with patient today and changes made to appropriate areas of the chart.   Review of Systems  Eyes:  Negative for blurred vision and double vision.  Respiratory:  Negative for shortness of breath.   Cardiovascular:  Negative for chest pain, palpitations and leg swelling.  Neurological:  Positive for tingling. Negative for dizziness and headaches.  All other ROS negative except what is listed above and in the HPI.      Objective:     There were no vitals taken for this visit.  Wt Readings from Last 3 Encounters:  12/24/21 (!) 318 lb (144.2 kg)  11/13/21 (!) 318 lb (144.2 kg)  11/12/21 (!) 317 lb 6.4 oz (144 kg)    Physical Exam Vitals and nursing note reviewed.  Constitutional:      General: He is not in acute distress.    Appearance: Normal appearance. He is not ill-appearing, toxic-appearing  or diaphoretic.  HENT:     Head: Normocephalic.     Right Ear: Tympanic membrane, ear canal and external ear normal.     Left Ear: Tympanic membrane, ear canal and external ear normal.     Nose: Nose normal. No congestion or rhinorrhea.     Mouth/Throat:     Mouth: Mucous membranes are moist.  Eyes:     General:        Right eye: No discharge.        Left eye: No discharge.     Extraocular Movements: Extraocular movements intact.     Conjunctiva/sclera: Conjunctivae normal.     Pupils: Pupils are equal, round, and reactive to light.  Cardiovascular:     Rate and Rhythm: Normal rate and regular rhythm.     Heart sounds: No murmur heard. Pulmonary:     Effort: Pulmonary effort is normal. No respiratory distress.     Breath sounds: Normal breath sounds. No wheezing, rhonchi or rales.  Abdominal:     General: Abdomen is flat. Bowel sounds are normal. There is no distension.     Palpations: Abdomen is soft.     Tenderness: There is no abdominal tenderness. There is no guarding.  Musculoskeletal:     Cervical back: Normal range of motion and neck supple.  Skin:    General: Skin is warm and dry.     Capillary Refill: Capillary refill takes less than 2 seconds.  Neurological:     General: No focal deficit present.     Mental Status: He is alert and oriented to person, place, and time.     Cranial Nerves: No cranial nerve deficit.     Motor: No weakness.     Deep Tendon Reflexes: Reflexes normal.  Psychiatric:        Mood and Affect: Mood normal.        Behavior: Behavior normal.        Thought Content: Thought  content normal.        Judgment: Judgment normal.    Results for orders placed or performed in visit on 07/02/21  Comp Met (CMET)  Result Value Ref Range   Glucose 112 (H) 70 - 99 mg/dL   BUN 10 6 - 20 mg/dL   Creatinine, Ser 0.81 0.76 - 1.27 mg/dL   eGFR 119 >59 mL/min/1.73   BUN/Creatinine Ratio 12 9 - 20   Sodium 140 134 - 144 mmol/L   Potassium 4.5 3.5 - 5.2 mmol/L   Chloride 104 96 - 106 mmol/L   CO2 23 20 - 29 mmol/L   Calcium 9.4 8.7 - 10.2 mg/dL   Total Protein 7.1 6.0 - 8.5 g/dL   Albumin 4.3 4.0 - 5.0 g/dL   Globulin, Total 2.8 1.5 - 4.5 g/dL   Albumin/Globulin Ratio 1.5 1.2 - 2.2   Bilirubin Total 0.4 0.0 - 1.2 mg/dL   Alkaline Phosphatase 123 (H) 44 - 121 IU/L   AST 18 0 - 40 IU/L   ALT 18 0 - 44 IU/L  Lipid Profile  Result Value Ref Range   Cholesterol, Total 163 100 - 199 mg/dL   Triglycerides 145 0 - 149 mg/dL   HDL 48 >39 mg/dL   VLDL Cholesterol Cal 25 5 - 40 mg/dL   LDL Chol Calc (NIH) 90 0 - 99 mg/dL   Chol/HDL Ratio 3.4 0.0 - 5.0 ratio  Vitamin D (25 hydroxy)  Result Value Ref Range   Vit D, 25-Hydroxy 30.4 30.0 -  100.0 ng/mL      Assessment & Plan:   Problem List Items Addressed This Visit      Other   Morbid obesity ([place])  Other Visit Diagnoses    Annual physical exam    -  Primary       Discussed aspirin prophylaxis for myocardial infarction prevention and decision was it was not indicated  LABORATORY TESTING:  Health maintenance labs ordered today as discussed above.    IMMUNIZATIONS:   - Tdap: Tetanus vaccination status reviewed: last tetanus booster within 10 years. - Influenza: Administered today - Pneumovax: Not applicable - Prevnar: Not applicable - COVID: Refused - HPV:  Monogamous relationship - Shingrix vaccine: Not applicable  SCREENING: - Colonoscopy: Not applicable  Discussed with patient purpose of the colonoscopy is to detect colon cancer at curable precancerous or early stages   - AAA Screening: Not applicable   -Hearing Test: Not applicable  -Spirometry: Not applicable   PATIENT COUNSELING:    Sexuality: Discussed sexually transmitted diseases, partner selection, use of condoms, avoidance of unintended pregnancy  and contraceptive alternatives.   Advised to avoid cigarette smoking.  I discussed with the patient that most people either abstain from alcohol or drink within safe limits (<=14/week and <=4 drinks/occasion for males, <=7/weeks and <= 3 drinks/occasion for females) and that the risk for alcohol disorders and other health effects rises proportionally with the number of drinks per week and how often a drinker exceeds daily limits.  Discussed cessation/primary prevention of drug use and availability of treatment for abuse.   Diet: Encouraged to adjust caloric intake to maintain  or achieve ideal body weight, to reduce intake of dietary saturated fat and total fat, to limit sodium intake by avoiding high sodium foods and not adding table salt, and to maintain adequate dietary potassium and calcium preferably from fresh fruits, vegetables, and low-fat dairy products.    stressed the importance of regular exercise  Injury prevention: Discussed safety belts, safety helmets, smoke detector, smoking near bedding or upholstery.   Dental health: Discussed importance of regular tooth brushing, flossing, and dental visits.   Follow up plan: NEXT PREVENTATIVE PHYSICAL DUE IN 1 YEAR. No follow-ups on file.

## 2022-01-01 ENCOUNTER — Encounter: Payer: Self-pay | Admitting: Nurse Practitioner

## 2022-01-01 ENCOUNTER — Ambulatory Visit (INDEPENDENT_AMBULATORY_CARE_PROVIDER_SITE_OTHER): Payer: BC Managed Care – PPO | Admitting: Nurse Practitioner

## 2022-01-01 VITALS — BP 132/83 | HR 79 | Temp 98.5°F | Ht 72.2 in | Wt 315.5 lb

## 2022-01-01 DIAGNOSIS — Z136 Encounter for screening for cardiovascular disorders: Secondary | ICD-10-CM | POA: Diagnosis not present

## 2022-01-01 DIAGNOSIS — Z0000 Encounter for general adult medical examination without abnormal findings: Secondary | ICD-10-CM

## 2022-01-01 LAB — URINALYSIS, ROUTINE W REFLEX MICROSCOPIC
Bilirubin, UA: NEGATIVE
Glucose, UA: NEGATIVE
Ketones, UA: NEGATIVE
Leukocytes,UA: NEGATIVE
Nitrite, UA: NEGATIVE
Protein,UA: NEGATIVE
RBC, UA: NEGATIVE
Specific Gravity, UA: 1.025 (ref 1.005–1.030)
Urobilinogen, Ur: 0.2 mg/dL (ref 0.2–1.0)
pH, UA: 6 (ref 5.0–7.5)

## 2022-01-01 MED ORDER — OMEPRAZOLE 20 MG PO CPDR: 20.0000 mg | DELAYED_RELEASE_CAPSULE | Freq: Every day | ORAL | 1 refills | Status: DC

## 2022-01-01 MED ORDER — ATORVASTATIN CALCIUM 20 MG PO TABS: 20.0000 mg | ORAL_TABLET | Freq: Every day | ORAL | 1 refills | Status: DC

## 2022-01-01 MED ORDER — FLUOXETINE HCL 40 MG PO CAPS: 40.0000 mg | ORAL_CAPSULE | Freq: Every day | ORAL | 1 refills | Status: DC

## 2022-01-01 NOTE — Assessment & Plan Note (Signed)
Recommended eating smaller high protein, low fat meals more frequently and exercising 30 mins a day 5 times a week with a goal of 10-15lb weight loss in the next 3 months. Patient voiced their understanding and motivation to adhere to these recommendations.  

## 2022-01-02 DIAGNOSIS — G4733 Obstructive sleep apnea (adult) (pediatric): Secondary | ICD-10-CM | POA: Diagnosis not present

## 2022-01-02 LAB — COMPREHENSIVE METABOLIC PANEL
ALT: 13 IU/L (ref 0–44)
AST: 17 IU/L (ref 0–40)
Albumin/Globulin Ratio: 1.8 (ref 1.2–2.2)
Albumin: 4.4 g/dL (ref 4.1–5.1)
Alkaline Phosphatase: 101 IU/L (ref 44–121)
BUN/Creatinine Ratio: 13 (ref 9–20)
BUN: 11 mg/dL (ref 6–20)
Bilirubin Total: 0.3 mg/dL (ref 0.0–1.2)
CO2: 19 mmol/L — ABNORMAL LOW (ref 20–29)
Calcium: 9.3 mg/dL (ref 8.7–10.2)
Chloride: 103 mmol/L (ref 96–106)
Creatinine, Ser: 0.86 mg/dL (ref 0.76–1.27)
Globulin, Total: 2.5 g/dL (ref 1.5–4.5)
Glucose: 91 mg/dL (ref 70–99)
Potassium: 4.9 mmol/L (ref 3.5–5.2)
Sodium: 141 mmol/L (ref 134–144)
Total Protein: 6.9 g/dL (ref 6.0–8.5)
eGFR: 117 mL/min/{1.73_m2} (ref >59)

## 2022-01-02 LAB — LIPID PANEL
Chol/HDL Ratio: 3 ratio (ref 0.0–5.0)
Cholesterol, Total: 150 mg/dL (ref 100–199)
HDL: 50 mg/dL (ref >39)
LDL Chol Calc (NIH): 86 mg/dL (ref 0–99)
Triglycerides: 70 mg/dL (ref 0–149)
VLDL Cholesterol Cal: 14 mg/dL (ref 5–40)

## 2022-01-02 LAB — CBC WITH DIFFERENTIAL/PLATELET
Basophils Absolute: 0 10*3/uL (ref 0.0–0.2)
Basos: 0 %
EOS (ABSOLUTE): 0.5 10*3/uL — ABNORMAL HIGH (ref 0.0–0.4)
Eos: 4 %
Hematocrit: 48.8 % (ref 37.5–51.0)
Hemoglobin: 16.6 g/dL (ref 13.0–17.7)
Immature Grans (Abs): 0 10*3/uL (ref 0.0–0.1)
Immature Granulocytes: 0 %
Lymphocytes Absolute: 3 10*3/uL (ref 0.7–3.1)
Lymphs: 30 %
MCH: 29.6 pg (ref 26.6–33.0)
MCHC: 34 g/dL (ref 31.5–35.7)
MCV: 87 fL (ref 79–97)
Monocytes Absolute: 0.8 10*3/uL (ref 0.1–0.9)
Monocytes: 8 %
Neutrophils Absolute: 5.9 10*3/uL (ref 1.4–7.0)
Neutrophils: 58 %
Platelets: 323 10*3/uL (ref 150–450)
RBC: 5.6 x10E6/uL (ref 4.14–5.80)
RDW: 12.6 % (ref 11.6–15.4)
WBC: 10.2 10*3/uL (ref 3.4–10.8)

## 2022-01-02 LAB — TSH: TSH: 1.54 u[IU]/mL (ref 0.450–4.500)

## 2022-01-02 NOTE — Progress Notes (Signed)
HI [employer]. It was nice to see you yesterday.  Your lab work looks good.  No concerns at this time. Continue with your current medication regimen.  Follow up as discussed.  Please let me know if you have any questions.

## 2022-01-06 ENCOUNTER — Encounter: Payer: Self-pay | Admitting: Physical Medicine & Rehabilitation

## 2022-01-06 ENCOUNTER — Encounter
Payer: BC Managed Care – PPO | Attending: Physical Medicine & Rehabilitation | Admitting: Physical Medicine & Rehabilitation

## 2022-01-06 VITALS — BP 121/89 | HR 78 | Ht 72.0 in | Wt 314.0 lb

## 2022-01-06 DIAGNOSIS — I7774 Dissection of vertebral artery: Secondary | ICD-10-CM | POA: Diagnosis not present

## 2022-01-06 DIAGNOSIS — I6389 Other cerebral infarction: Secondary | ICD-10-CM

## 2022-01-06 DIAGNOSIS — G464 Cerebellar stroke syndrome: Secondary | ICD-10-CM

## 2022-01-06 NOTE — Progress Notes (Signed)
Subjective:    Patient ID: Curtis [name], male    DOB: Nov 07, 1985, 36 y.o.   MRN: [id number]  HPI  36 year old male with history of left medullary infarct due to left vertebral artery dissection.  Had history of chiropractic treatment. Visual issues have improved to a great degree at least working stationary from home computer. He is independent with dressing and bathing independent with mobility.  His balance issues are confined to higher level activities such as standing on 1 leg.  He is driving.  He is working 40 hours/week from home Works from home Still having visual focusing issues when tracking moving object, see Neuro Optho Dr Karleen Hampshire  Balance is overall better feels a little imbalance the first few steps occurs ~10% of time  No falls  Persistent temperature and touch deficit on the right side   Driving without issues    Pain Inventory Average Pain 0 Pain Right Now 0 My pain is  no pain  In the last 24 hours, has pain interfered with the following? General activity 0 Relation with others 0 Enjoyment of life 0 What TIME of day is your pain at its worst? No pain Sleep (in general) Fair  Pain is worse with:  no pain Pain improves with:  no pain Relief from Meds:  no pain  Family History  Problem Relation Age of Onset   COPD Maternal Grandmother    Arthritis Maternal Grandmother    Alcohol abuse Maternal Grandfather    Heart attack Maternal Grandfather    Social History   Socioeconomic History   Marital status: Married    Spouse name: Not on file   Number of children: Not on file   Years of education: Not on file   Highest education level: Not on file  Occupational History   Not on file  Tobacco Use   Smoking status: Former    Types: Cigars   Smokeless tobacco: Never  Vaping Use   Vaping Use: Former  Substance and Sexual Activity   Alcohol use: Yes    Alcohol/week: 2.0 standard drinks of alcohol    Types: 2 Shots of liquor per week   Drug use:  Never   Sexual activity: Yes  Other Topics Concern   Not on file  Social History Narrative   Not on file   Social Determinants of Health   Financial Resource Strain: Not on file  Food Insecurity: Not on file  Transportation Needs: Not on file  Physical Activity: Not on file  Stress: Not on file  Social Connections: Not on file   Past Surgical History:  Procedure Laterality Date   GALLBLADDER SURGERY  01/23/2021   removed   Past Surgical History:  Procedure Laterality Date   GALLBLADDER SURGERY  01/23/2021   removed   Past Medical History:  Diagnosis Date   Acute cerebrovascular accident (CVA) of medulla oblongata ([facility]) 09/09/2020   a.) small acute infarct of the lateral LEFT medulla in setting of LEFT vertebral artery dissection.   Anxiety    ASD (atrial septal defect) 09/14/2020   a.) TTE 09/14/2020: EF 65-70%; (+) IAS noted on color flow Doppler.   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   BP 121/89   Pulse 78   Ht 6' (1.829 m)   Wt (!) 314 lb (142.4 kg)   SpO2 97%   BMI 42.59 kg/m   Opioid Risk Score:   Fall Risk Score:  `1  Depression screen PHQ  2/9     01/06/2022   10:07 AM 01/01/2022    8:22 AM 07/15/2021   12:25 PM 07/02/2021    8:55 AM 04/28/2021    9:14 AM 03/26/2021    9:25 AM 01/14/2021   10:18 AM  Depression screen PHQ 2/9  Decreased Interest 0 0 0 0 0 0 0  Down, Depressed, Hopeless 0 0 0 0 1 0 0  PHQ - 2 Score 0 0 0 0 1 0 0  Altered sleeping  0  3 0 3   Tired, decreased energy  2  1 2 3    Change in appetite  2  2 0 2   Feeling bad or failure about yourself   0  0 0 0   Trouble concentrating  1  0 2 0   Moving slowly or fidgety/restless  0  0 1 0   Suicidal thoughts  0  0 0 0   PHQ-9 Score  5  6 6 8    Difficult doing work/chores  Not difficult at all  Not difficult at all Not difficult at all Not difficult at all      Review of Systems  Constitutional: Negative.   HENT: Negative.    Eyes: Negative.    Respiratory: Negative.    Cardiovascular: Negative.   Gastrointestinal: Negative.   Endocrine: Negative.   Genitourinary: Negative.   Musculoskeletal: Negative.   Skin: Negative.   Allergic/Immunologic: Negative.   Neurological: Negative.   Hematological: Negative.   Psychiatric/Behavioral: Negative.    All other systems reviewed and are negative.      Objective:   Physical Exam Visual fields are intact confrontation testing Extraocular muscles intact No evidence of facial droop Motor strength is 5/5 bilateral deltoid bicep tricep grip hip flexion extension ankle dorsiflexion Tone is normal bilateral upper and lower limbs Patient has difficulty with tandem gait but is able to perform toe walk and heel walk. Normal Romberg Sharpened Romberg with increased sway       Assessment & Plan:   1.  History of left vertebral artery dissection causing left medullary infarct.  As discussed with patient's has has had good recovery although not back to normal.  He is able to work perform his self-care and mobility as well as drive.  He has been residual balance issues as well as visual tracking problems. He will follow-up with neurology as well as primary care Physical medicine rehab follow-up on as-needed basis.

## 2022-01-14 NOTE — Progress Notes (Deleted)
  [name] [address] [place] [address] Phone: [phone] Subjective:    I'm seeing this patient by the request  of:  Larae Grooms, NP  CC:   [id number]  Curtis [name] is a 36 y.o. male coming in with complaint of back pain. OMT 12/24/2021. Patient states   Medications patient has been prescribed: None  Taking:         Reviewed prior external information including notes and imaging from previsou exam, outside providers and external EMR if available.   As well as notes that were available from care everywhere and other healthcare systems.  Past medical history, social, surgical and family history all reviewed in electronic medical record.  No pertanent information unless stated regarding to the chief complaint.   Past Medical History:  Diagnosis Date   Acute cerebrovascular accident (CVA) of medulla oblongata ([facility]) 09/09/2020   a.) small acute infarct of the lateral LEFT medulla in setting of LEFT vertebral artery dissection.   Anxiety    ASD (atrial septal defect) 09/14/2020   a.) TTE 09/14/2020: EF 65-70%; (+) IAS noted on color flow Doppler.   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 POSITIVE muscle aches  Objective  There were no vitals taken for this visit.   General: No apparent distress alert and oriented x3 mood and affect normal, dressed appropriately.  HEENT: Pupils equal, extraocular movements intact  Respiratory: Patient's speak in full sentences and does not appear short of breath  Cardiovascular: No lower extremity edema, non tender, no erythema  Gait MSK:  Back   Osteopathic  findings   T3 extended rotated and side bent right inhaled rib T9 extended rotated and side bent left L2 flexed rotated and side bent right Sacrum right on right       Assessment and Plan:  No problem-specific Assessment & Plan notes found for this encounter.    Nonallopathic problems  Decision today to treat with OMT was based on Physical Exam  After verbal consent patient was treated with HVLA, ME, FPR techniques in  rib, thoracic, lumbar, and sacral  areas  Patient tolerated the procedure well with improvement in symptoms  Patient given exercises, stretches and lifestyle modifications  See medications in patient instructions if given  Patient will follow up in 4-8 weeks     The above documentation has been reviewed and is accurate and complete [name], DO         Note: This dictation was prepared with Dragon dictation along with smaller phrase technology. Any transcriptional errors that result from this process are unintentional.

## 2022-01-21 ENCOUNTER — Ambulatory Visit: Payer: BC Managed Care – PPO | Admitting: Family Medicine

## 2022-01-22 DIAGNOSIS — G4733 Obstructive sleep apnea (adult) (pediatric): Secondary | ICD-10-CM | POA: Diagnosis not present

## 2022-02-02 DIAGNOSIS — G4733 Obstructive sleep apnea (adult) (pediatric): Secondary | ICD-10-CM | POA: Diagnosis not present

## 2022-02-03 ENCOUNTER — Telehealth: Payer: Self-pay | Admitting: Pulmonary Disease

## 2022-02-03 NOTE — Telephone Encounter (Signed)
CPAP compliance from 12/30/2021-01/28/2022-  Average use of 6 hours 2 minutes AutoSet 5-15 Residual AHI 1.2

## 2022-02-04 NOTE — Progress Notes (Unsigned)
[facility] [address] [place]. [place] [address] [phone]       STROKE FOLLOW UP NOTE  [name] Date of Birth:  December 09, 1985 Medical Record Number:  [id number]   Reason for Referral:  stroke follow up    SUBJECTIVE:   CHIEF COMPLAINT:  No chief complaint on file.    HPI:   Update 02/05/2022 JM: Patient returns for 48-month stroke follow-up.  Overall stable without new stroke/TIA symptoms.  Reports residual ***.   Was started on gabapentin at prior visit for right hand dysesthesias ***  Evaluated by neuro-ophthalmology Dr. Frederico Hamman ***  Compliant on aspirin and atorvastatin Blood pressure well-controlled Routinely follows with PCP      History provided for reference purposes only Update 08/05/2021 JM: Patient returns for 30-month stroke follow-up accompanied by his wife.  Overall stable without new stroke/TIA symptoms.  Reports continued visual impairment that can worsen towards the end of the day with eye fatigue or strain, scheduled to see neuro-ophthalmology Dr. Frederico Hamman Friday. C/o right hand dysesthesias which has become more present especially with holding/lifting heavy objects. Continued vertigo issues especially with quick head movements, quick position changes or when laying on the floor playing with his daughter, can cause nausea after that can last a couple of hours. Continued imbalance greater with fatigue or ambulation on uneven ground.  He has been trying to increase his activity, trying to stay active and returning to playing golf.  Continues to maintain ADLs and IADLs independently as well as driving and working. Compliant on aspirin and atorvastatin, denies side effects.  Blood pressure today 132/80. Completed HST last week at [facility] - currently awaiting results.  No further concerns at this time.  Update 02/03/2021 JM: Returns for 3-month stroke follow-up unaccompanied. Overall stable. Denies new stroke/TIA symptoms. Residual visual  difficulties: worse vision when looking side to side (L>R), poor focus after focusing for too long, intermittent diplopia at end of day or watching something for too long - has appt with Dr. Frederico Hamman neuro-ophthalmology this afternoon. Residual RUE altered sensory stable without much improvement and occasional right hand stiffness sensation - improving.  Residual imbalance but gradually improving. Completed PT 12/21. Returned back to work 1/3 and previously returned back to driving - does fatigue quick but his work has been allowing him to slowly return back.  Completed 3 months DAPT - as on aspirin but held with recent gallbladder removal procedure and not yet restarted. Stopped atorvastatin as he believes was only needed for 3 mo duration - no side effects while on. Recent lipid panel 11/2020 showed LDL 103.  Blood pressure today 143/94 - routinely monitors at home and typically 120s/80s. No further concerns at this time.   Update 10/17/2020 JM: Curtis [name] is being seen for hospital follow-up accompanied by his wife.  He has been doing well since discharge.  Reports continued improvement of residual diplopia, RUE sensory deficit and gait impairment.  Currently working with therapies.  Continued use of eye patch over left eye.  Occasional use of RW but today was able to ambulate with AD but was assisted by his wife.  Reports RUE sensory improving and starting to experience a tingling sensation.  He continues to experience left-sided neck pain and left-sided headache (headache present with increased neck pain). C/o neck pain PTA with wife concerned regarding worsening after his stroke.  He is currently being followed by Hulan Saas, DO orthopedic.  Feels like neck pain is worse in the morning upon awakening  and in the evening.  He remains on short-term disability.  Denies new stroke/TIA symptoms.  Remains on aspirin and Plavix with mild bruising but no bleeding.  Remains on atorvastatin 40 mg daily without side  effects.  Blood pressure today 128/88.  No further concerns at this time.  Stroke admission 09/09/2020 Curtis [name] is a 37 year old male w/pmh of DJD, neck pain and headache who presented to [facility] on 09/09/2020 with dizziness, vertigo, nausea and leaning towards the left.  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 He was discharged to [facility] on 09/13/2020.  Neurology consulted at [facility] due to recent stroke.  Evaluated by Dr. Roda Shutters who recommended continuation of aspirin and Plavix for 3 months and follow-up with repeat CT head/neck 2-3 months.  EF 65 to 70% however ASD by color Doppler flow likely to be asymptomatic incidental finding and not associated with current stroke which was felt most likely due to VA dissection from neck manipulation.  He was advised to follow-up with cardiology outpatient for further management.  A1c 5.4.  LDL 67. Recommended continued participation with PT/OT at CIR with residual diplopia, left-sided numbness and headache.      PERTINENT IMAGING  CT HEAD 09/09/2020 IMPRESSION: CT head: 1. No evidence of acute intracranial abnormality. 2. Paranasal sinus disease, as described. Correlate for acute sinusitis.    CT ANGIO HEAD NECK  IMPRESSION 12/27/2020 1. Essentially normalized appearance of the left vertebral artery. No evidence of baseline vasculopathy. 2. Extensive sinus opacification and bronchial airway thickening.  IMPRESSION: 09/09/2020 1. Irregularity of the V2 segment of the left vertebral artery with sites of up to mild/moderate stenosis. Irregularity of the V4 left vertebral artery with sites of up to moderate/severe stenosis. These findings are highly suspicious for left vertebral artery dissection given the provided history. A brain MRI is recommended to assess for acute intracranial ischemia. 2. Elsewhere within the head and neck, there is no large vessel occlusion or proximal high-grade  arterial stenosis. 3. Aberrant right subclavian artery. The right subclavian artery origin is incompletely included in the field of view.  IMPRESSION 09/11/2020 IMPRESSION: No acute intracranial abnormality. Small infarct of medulla is better seen on prior MRI.   Persistent findings of left vertebral artery dissection extracranially and intracranially. Slightly increased narrowing at the level of the C2 transverse foramen. Remains up to severe stenosis intracranially.   MR BRAIN WO CONTRAST  IMPRESSION: 09/09/2020 Small acute stroke of the lateral left medulla.  IMPRESSION: 09/11/2020 More conspicuous signal abnormality in the left medulla that could possibly be evolutionary change of the previous stroke, though some extension is not excluded. Petechial blood products in the region without measurable hematoma.   MR CERVICAL SPINE WO CONTRAST 09/09/2020 IMPRESSION: No abnormal cord signal.  No canal or foraminal stenosis.          ROS:   14 system review of systems performed and negative with exception of those listed in HPI  PMH:  Past Medical History:  Diagnosis Date   Acute cerebrovascular accident (CVA) of medulla oblongata (HCC) 09/09/2020   a.) small acute infarct of the lateral LEFT medulla in setting of LEFT vertebral artery dissection.   Anxiety    ASD (atrial septal defect) 09/14/2020   a.) TTE 09/14/2020: EF 65-70%; (+) IAS noted on color flow Doppler.   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    PSH:  Past Surgical History:  Procedure Laterality Date   GALLBLADDER SURGERY  01/23/2021   removed    Social History:  Social History   Socioeconomic History   Marital status: Married    Spouse name: Not on file   Number of children: Not on file   Years of education: Not on file   Highest education level: Not on file  Occupational History   Not on file  Tobacco Use   Smoking status: Former    Types: Cigars   Smokeless  tobacco: Never  Vaping Use   Vaping Use: Former  Substance and Sexual Activity   Alcohol use: Yes    Alcohol/week: 2.0 standard drinks of alcohol    Types: 2 Shots of liquor per week   Drug use: Never   Sexual activity: Yes  Other Topics Concern   Not on file  Social History Narrative   Not on file   Social Determinants of Health   Financial Resource Strain: Not on file  Food Insecurity: Not on file  Transportation Needs: Not on file  Physical Activity: Not on file  Stress: Not on file  Social Connections: Not on file  Intimate Partner Violence: Not on file    Family History:  Family History  Problem Relation Age of Onset   COPD Maternal Grandmother    Arthritis Maternal Grandmother    Alcohol abuse Maternal Grandfather    Heart attack Maternal Grandfather     Medications:   Current Outpatient Medications on File Prior to Visit  Medication Sig Dispense Refill   aspirin EC 81 MG tablet Take 81 mg by mouth daily. Swallow whole.     atorvastatin (LIPITOR) 20 MG tablet Take 1 tablet (20 mg total) by mouth daily. 90 tablet 1   FLUoxetine (PROZAC) 40 MG capsule Take 1 capsule (40 mg total) by mouth daily. 90 capsule 1   fluticasone (FLONASE) 50 MCG/ACT nasal spray Place 2 sprays into both nostrils daily.     gabapentin (NEURONTIN) 300 MG capsule Take 1 capsule (300 mg total) by mouth at bedtime. 30 capsule 5   omeprazole (PRILOSEC) 20 MG capsule Take 1 capsule (20 mg total) by mouth daily. 90 capsule 1   ondansetron (ZOFRAN-ODT) 4 MG disintegrating tablet Take 1 tablet (4 mg total) by mouth every 8 (eight) hours as needed for nausea or vomiting. 20 tablet 5   No current facility-administered medications on file prior to visit.    Allergies:  No Known Allergies    OBJECTIVE:  Physical Exam  There were no vitals filed for this visit.  There is no height or weight on file to calculate BMI. No results found.   General: well developed, well nourished, very pleasant  young Caucasian male, seated, in no evident distress Head: head normocephalic and atraumatic.   Neck: supple with no carotid or supraclavicular bruits Cardiovascular: regular rate and rhythm, no murmurs Musculoskeletal: no deformity Skin:  no rash/petichiae Vascular:  Normal pulses all extremities   Neurologic Exam Mental Status: Awake and fully alert. Fluent speech and language. Oriented to place and time. Recent and remote memory intact. Attention span, concentration and fund of knowledge appropriate. Mood and affect appropriate.  Cranial Nerves: Pupils equal, briskly reactive to light. Extraocular movements persistent nystagmus worsening when looking towards left.  Visual fields full to confrontation. Hearing intact. Decreased facial sensory. Face, tongue, palate moves normally and symmetrically.  Motor: Normal bulk and tone. Normal strength in all tested extremity muscles  Sensory.: intact to touch , pinprick , position and  vibratory sensation except RUE decreased light touch, temp. and pinprick sensation distally Coordination: Rapid alternating movements normal in all extremities. Finger-to-nose and heel-to-shin performed accurately bilaterally. Gait and Station: Arises from chair without difficulty. Stance is normal. Gait demonstrates normal stride length and balance without use of AD.  Tandem walk and heel toe with mild difficulty. Romberg - mild swaying towards right  Reflexes: 1+ and symmetric. Toes downgoing.        ASSESSMENT: Curtis [name] is a 37 y.o. 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 Work up completed at [facility] and was evaluated by Dr. Erlinda Hong during [facility] CIR admission.  Vascular risk factors include DJD.      PLAN:  Left lateral medullary stroke:  Residual deficit: RUE sensory impairment with dysesthesias, visual impairment, vertigo and imbalance.   Patient concerned of some worsening but likely more in setting  of trying to be more active. Neuro exam today stable compared to prior visit.   Referral placed to NR PT for vertigo and imbalance.   Trial zofran PRN to help with nausea associated with vertigo.   Trial gabapentin 300 mg nightly for right hand dysesthesias, also discussed use of compression glove for possible benefit.   Continue to follow with neuro-ophthalmology and PMR Continue aspirin 81 mg daily and atorvastatin for secondary stroke prevention. Recommend life long use unless contraindicated in the future for secondary stroke prevention.  Defer management/rx refills to PCP Discussed secondary stroke prevention measures and importance of close PCP follow up for aggressive stroke risk factor management including HLD with LDL goal<70.  I have gone over the pathophysiology of stroke, warning signs and symptoms, risk factors and their management in some detail with instructions to go to the closest emergency room for symptoms of concern. VA dissection:  No indication for repeat imaging as essentially stable and prior imaging 6 months ago showed resolution of dissection. Advised if he starts to experience persistent vertigo symptoms, visual changes, N/V, or any other significant worsening/new neurological symptoms to proceed to ED immediately for further evaluation Repeat CTA head/neck 12/2020 showed essentially normalized appearance of L VA CTA head/neck 08/2020 irregularity of L V2 and V4 highly suspicious for L VA dissection avoid contact sports, chiropractic neck manipulation, and any activity that involves abrupt rotation and flexion/extension of neck and close monitoring of blood pressure to avoid hypotension Likely in setting of neck manipulation from chiropractor treatments      Follow up in 6 months or call earlier if needed - if remains stable, can discuss f/u as needed   CC:  PCP: [name], NP    I spent 36 minutes of face-to-face and non-face-to-face time with patient and  wife.  This included previsit chart review, lab review, study review, order entry, electronic health record documentation, patient and wife's education regarding prior stroke including etiology, VA dissection precautions, secondary stroke prevention measures and importance of managing stroke risk factors, residual deficits and possible further recovery, and answered all other questions to patient and wife's satisfaction  Frann Rider, AGNP-BC  [facility] [address] [place], [place] [address]  Phone [phone] Fax [phone] Note: This document was prepared with digital dictation and possible smart phrase technology. Any transcriptional errors that result from this process are unintentional.

## 2022-02-05 ENCOUNTER — Encounter: Payer: Self-pay | Admitting: Adult Health

## 2022-02-05 ENCOUNTER — Ambulatory Visit (INDEPENDENT_AMBULATORY_CARE_PROVIDER_SITE_OTHER): Payer: BC Managed Care – PPO | Admitting: Adult Health

## 2022-02-05 VITALS — BP 128/80 | HR 86 | Ht 72.0 in | Wt 315.0 lb

## 2022-02-05 DIAGNOSIS — I639 Cerebral infarction, unspecified: Secondary | ICD-10-CM | POA: Diagnosis not present

## 2022-02-05 DIAGNOSIS — I7774 Dissection of vertebral artery: Secondary | ICD-10-CM | POA: Diagnosis not present

## 2022-02-05 NOTE — Patient Instructions (Signed)
Continue aspirin 81 mg daily  and atorvastatin for secondary stroke prevention  Continue to follow up with PCP regarding blood pressure and cholesterol management  Maintain strict control of hypertension with blood pressure goal below 130/90 and cholesterol with LDL cholesterol (bad cholesterol) goal below 70 mg/dL.   Signs of a Stroke? Follow the BEFAST method:  Balance Watch for a sudden loss of balance, trouble with coordination or vertigo Eyes Is there a sudden loss of vision in one or both eyes? Or double vision?  Face: Ask the person to smile. Does one side of the face droop or is it numb?  Arms: Ask the person to raise both arms. Does one arm drift downward? Is there weakness or numbness of a leg? Speech: Ask the person to repeat a simple phrase. Does the speech sound slurred/strange? Is the person confused ? Time: If you observe any of these signs, call [phone].       Thank you for coming to see us at [facility]. I hope we have been able to provide you high quality care today.  You may receive a patient satisfaction survey over the next few weeks. We would appreciate your feedback and comments so that we may continue to improve ourselves and the health of our patients.  

## 2022-02-06 ENCOUNTER — Encounter: Payer: Self-pay | Admitting: Pulmonary Disease

## 2022-02-06 ENCOUNTER — Ambulatory Visit (INDEPENDENT_AMBULATORY_CARE_PROVIDER_SITE_OTHER): Payer: BC Managed Care – PPO | Admitting: Pulmonary Disease

## 2022-02-06 VITALS — BP 124/80 | HR 75 | Ht 72.0 in | Wt 319.0 lb

## 2022-02-06 DIAGNOSIS — I639 Cerebral infarction, unspecified: Secondary | ICD-10-CM | POA: Diagnosis not present

## 2022-02-06 DIAGNOSIS — G4733 Obstructive sleep apnea (adult) (pediatric): Secondary | ICD-10-CM

## 2022-02-06 DIAGNOSIS — H518 Other specified disorders of binocular movement: Secondary | ICD-10-CM | POA: Diagnosis not present

## 2022-02-06 DIAGNOSIS — H538 Other visual disturbances: Secondary | ICD-10-CM | POA: Diagnosis not present

## 2022-02-06 NOTE — Progress Notes (Signed)
Curtis [name]    [id number]    1985-04-27  Primary Care Physician:Holdsworth, Santiago Glad, NP  Referring Physician: [name], NP [address] [place],  [place] [address]  Chief complaint:   Patient being seen for obstructive sleep apnea  HPI:  Recently had a home sleep study showing severe obstructive sleep apnea with an AHI of 70  Has been using CPAP for about a month now and tolerating it well Compliance from CPAP shows nightly use, residual AHI 1.1  Appears to be tolerating it well with no significant issues  Did have snoring, nonrestorative sleep Now wakes up feeling like he is at a good nights rest  No morning headaches No night sweats No driving issues  Poor short-term memory  Usually goes to bed about 11, takes about 5 to 10 minutes to fall asleep 2-3 awakenings Final wake up time about 6:30 in the morning  Had a stroke about a year ago contributed to significant weight gain  Reformed smoker quit about last year-smoking cigars  No pertinent occupational risk, works as a Designer, jewellery   Outpatient Encounter Medications as of 02/06/2022  Medication Sig   aspirin EC 81 MG tablet Take 81 mg by mouth daily. Swallow whole.   atorvastatin (LIPITOR) 20 MG tablet Take 1 tablet (20 mg total) by mouth daily.   FLUoxetine (PROZAC) 40 MG capsule Take 1 capsule (40 mg total) by mouth daily.   fluticasone (FLONASE) 50 MCG/ACT nasal spray Place 2 sprays into both nostrils daily.   gabapentin (NEURONTIN) 300 MG capsule Take 1 capsule (300 mg total) by mouth at bedtime.   omeprazole (PRILOSEC) 20 MG capsule Take 1 capsule (20 mg total) by mouth daily.   ondansetron (ZOFRAN-ODT) 4 MG disintegrating tablet Take 1 tablet (4 mg total) by mouth every 8 (eight) hours as needed for nausea or vomiting.   No facility-administered encounter medications on file as of 02/06/2022.    Allergies as of 02/06/2022   (No Known Allergies)    Past Medical History:   Diagnosis Date   Acute cerebrovascular accident (CVA) of medulla oblongata ([place]) 09/09/2020   a.) small acute infarct of the lateral LEFT medulla in setting of LEFT vertebral artery dissection.   Anxiety    ASD (atrial septal defect) 09/14/2020   a.) TTE 09/14/2020: EF 65-70%; (+) IAS noted on color flow Doppler.   Heart murmur    LEFT vertebral artery dissection ([place]) 09/09/2020    Past Surgical History:  Procedure Laterality Date   GALLBLADDER SURGERY  01/23/2021   removed    Family History  Problem Relation Age of Onset   COPD Maternal Grandmother    Arthritis Maternal Grandmother    Alcohol abuse Maternal Grandfather    Heart attack Maternal Grandfather     Social History   Socioeconomic History   Marital status: Married    Spouse name: Not on file   Number of children: Not on file   Years of education: Not on file   Highest education level: Not on file  Occupational History   Not on file  Tobacco Use   Smoking status: Former    Types: Cigars   Smokeless tobacco: Never  Vaping Use   Vaping Use: Former  Substance and Sexual Activity   Alcohol use: Yes    Alcohol/week: 2.0 standard drinks of alcohol    Types: 2 Shots of liquor per week   Drug use: Never   Sexual activity: Yes  Other Topics Concern   Not on file  Social History Narrative   Not on file   Social Determinants of Health   Financial Resource Strain: Not on file  Food Insecurity: Not on file  Transportation Needs: Not on file  Physical Activity: Not on file  Stress: Not on file  Social Connections: Not on file  Intimate Partner Violence: Not on file    Review of Systems  Constitutional:  Positive for fatigue.  Respiratory:  Positive for apnea.   Psychiatric/Behavioral:  Positive for sleep disturbance.     Vitals:   02/06/22 0838  BP: 124/80  Pulse: 75  SpO2: 98%     Physical Exam Constitutional:      Appearance: He is obese.  HENT:     Mouth/Throat:     Mouth: Mucous  membranes are moist.     Comments: Mallampati 3, crowded oropharynx, neck size 18 Eyes:     General: No scleral icterus. Cardiovascular:     Rate and Rhythm: Normal rate and regular rhythm.     Heart sounds: No murmur heard.    No friction rub.  Pulmonary:     Effort: No respiratory distress.     Breath sounds: No stridor. No wheezing or rhonchi.  Musculoskeletal:     Cervical back: No rigidity or tenderness.  Neurological:     Mental Status: He is alert.  Psychiatric:        Mood and Affect: Mood normal.     Epworth Sleepiness Scale of 6  Data Reviewed: Sleep study results from 07/21/2021 reviewed with the patient showing AHI of 70   Compliance shows excellent compliance Using it nightly Set between 5 and 15 Residual AHI 1.1  Assessment:  Severe obstructive sleep apnea with oxygen desaturations On CPAP therapy and tolerating CPAP well  Class III obesity  Sleep quality is improved Pathophysiology of sleep disordered breathing reviewed with patient Treatment options reviewed with the patient  Plan/Recommendations: Continue CPAP therapy  Graded activities as tolerated  Follow-up in 6 months  Encouraged to call with significant concerns   Sherrilyn Rist MD [facility] 02/06/2022, 8:54 AM  CC: [name], NP

## 2022-02-06 NOTE — Patient Instructions (Signed)
I will see you back in 6 months  Continue using CPAP on a nightly basis  Weight loss efforts as tolerated  Call us with significant concerns  Your CPAP appears to be working well at present

## 2022-02-17 NOTE — Progress Notes (Unsigned)
Curtis [name] [address] [place] Phone: [phone] Subjective:   IVilma Meckel, am serving as a scribe for Dr. Hulan Saas.  I'm seeing this patient by the request  of:  [name], NP  CC: Back pain follow-up  [id number]  [name] is a 37 y.o. male coming in with complaint of back and neck pain. OMT on 12/24/2021. Patient states here for manipulation. A little more stiffer than usual patient was unable to come into the last office visit.  Patient has more tightness noted.  Medications patient has been prescribed: None  Taking:         Reviewed prior external information including notes and imaging from previsou exam, outside providers and external EMR if available.   As well as notes that were available from care everywhere and other healthcare systems.  Past medical history, social, surgical and family history all reviewed in electronic medical record.  No pertanent information unless stated regarding to the chief complaint.   Past Medical History:  Diagnosis Date   Acute cerebrovascular accident (CVA) of medulla oblongata ([place]) 09/09/2020   a.) small acute infarct of the lateral LEFT medulla in setting of LEFT vertebral artery dissection.   Anxiety    ASD (atrial septal defect) 09/14/2020   a.) TTE 09/14/2020: EF 65-70%; (+) IAS noted on color flow Doppler.   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 POSITIVE muscle aches  Objective  Blood pressure (!) 130/92, pulse 98, height 6' (1.829 m), weight (!) 310 lb (140.6 kg), SpO2 99 %.   General: No apparent distress alert and oriented x3  mood and affect normal, dressed appropriately.  HEENT: Pupils equal, extraocular movements intact  Respiratory: Patient's speak in full sentences and does not appear short of breath  Cardiovascular: No lower extremity edema, non tender, no erythema  Mid back significant tightness noted on the left parascapular area.  Patient does have more tightness noted in the left thoracolumbar juncture and swelling noted than usual.  Patient does have a positive FABER test noted on the right greater than left.  Some mild scapular dyskinesis on the left side.  Osteopathic findings  T3 extended rotated and side bent right inhaled rib T7 extended rotated and side bent left T9 flexed rotated and side bent right T11 flexed rotated and side bent left L1 flexed rotated and side bent right Sacrum right on right       Assessment and Plan:  Thoracic back pain Patient had mild worsening of the thoracic back pain.  Describes the pain as a dull, throbbing aching sensation.  Patient did respond relatively well to osteopathic manipulation and home exercises.  We discussed with patient to continue to work on posture and ergonomics.  Follow-up with me again in 6 to 8 weeks.    Nonallopathic problems  Decision today to treat with OMT was based on Physical Exam  After verbal consent patient was treated with HVLA, ME, FPR techniques in rib, thoracic, lumbar, and sacral  areas  Patient tolerated the procedure well with improvement in symptoms  Patient given exercises, stretches and lifestyle modifications  See medications in patient instructions if given  Patient will follow  up in 6-8 weeks    The above documentation has been reviewed and is accurate and complete Lyndal Pulley, DO          Note: This dictation was prepared with Dragon dictation along with smaller phrase technology. Any transcriptional errors that result from this process are unintentional.

## 2022-02-18 ENCOUNTER — Ambulatory Visit (INDEPENDENT_AMBULATORY_CARE_PROVIDER_SITE_OTHER): Payer: BC Managed Care – PPO | Admitting: Family Medicine

## 2022-02-18 VITALS — BP 130/92 | HR 98 | Ht 72.0 in | Wt 310.0 lb

## 2022-02-18 DIAGNOSIS — M546 Pain in thoracic spine: Secondary | ICD-10-CM

## 2022-02-18 DIAGNOSIS — M9902 Segmental and somatic dysfunction of thoracic region: Secondary | ICD-10-CM

## 2022-02-18 DIAGNOSIS — M9903 Segmental and somatic dysfunction of lumbar region: Secondary | ICD-10-CM | POA: Diagnosis not present

## 2022-02-18 DIAGNOSIS — M9904 Segmental and somatic dysfunction of sacral region: Secondary | ICD-10-CM | POA: Diagnosis not present

## 2022-02-18 DIAGNOSIS — G8929 Other chronic pain: Secondary | ICD-10-CM | POA: Diagnosis not present

## 2022-02-18 NOTE — Assessment & Plan Note (Signed)
Patient had mild worsening of the thoracic back pain.  Describes the pain as a dull, throbbing aching sensation.  Patient did respond relatively well to osteopathic manipulation and home exercises.  We discussed with patient to continue to work on posture and ergonomics.  Follow-up with me again in 6 to 8 weeks.

## 2022-02-18 NOTE — Patient Instructions (Signed)
See you again in 6-8 weeks Good luck with the house

## 2022-03-05 DIAGNOSIS — G4733 Obstructive sleep apnea (adult) (pediatric): Secondary | ICD-10-CM | POA: Diagnosis not present

## 2022-04-03 DIAGNOSIS — G4733 Obstructive sleep apnea (adult) (pediatric): Secondary | ICD-10-CM | POA: Diagnosis not present

## 2022-04-03 NOTE — Progress Notes (Unsigned)
[facility] [address] [place] Phone: [phone] Subjective:   IVilma Meckel, am serving as a scribe for Dr. Hulan Saas.  I'm seeing this patient by the request  of:  [name], NP  CC: Back and neck pain  [id number]  Curtis [name] is a 37 y.o. male coming in with complaint of back andpain. OMT 02/18/2022. Patient states same per usual. Had a vertigo incident on Saturday. Has been dizzy on and off every since. No other concerns.  Medications patient has been prescribed: None          Reviewed prior external information including notes and imaging from previsou exam, outside providers and external EMR if available.   As well as notes that were available from care everywhere and other healthcare systems.  Past medical history, social, surgical and family history all reviewed in electronic medical record.  No pertanent information unless stated regarding to the chief complaint.   Past Medical History:  Diagnosis Date   Acute cerebrovascular accident (CVA) of medulla oblongata ([place]) 09/09/2020   a.) small acute infarct of the lateral LEFT medulla in setting of LEFT vertebral artery dissection.   Anxiety    ASD (atrial septal defect) 09/14/2020   a.) TTE 09/14/2020: EF 65-70%; (+) IAS noted on color flow Doppler.   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 POSITIVE muscle aches  Objective  Blood pressure 124/84, height 6' (1.829 m), weight (!) 303 lb (137.4 kg).   General: No apparent distress alert and oriented x3 mood and affect normal, dressed appropriately.  HEENT: Pupils equal, extraocular  movements intact  Respiratory: Patient's speak in full sentences and does not appear short of breath  Cardiovascular: No lower extremity edema, non tender, no erythema  Neck exam does have relatively good range of motion.  No significant vertigo type sensation when patient looks up.  More with changing of positions.  Osteopathic findings T7 extended rotated and side bent right T9 extended rotated and side bent left L2 flexed rotated and side bent right Sacrum right on right       Assessment and Plan:  Benign paroxysmal positional vertigo Seems to be more of a benign positional vertigo.  Discussed with patient though that with patient's history of stroke continues to be a problem highly vigilant.  Discussed potential imaging.  Patient was to see how he did.  Patient requested prednisone.  Does have some allergies he states.  Patient knows if any radicular symptoms, worsening discomfort and pain associated with any headaches he needs to seek medical attention immediately.  Patient is in agreement with the plan.  Follow-up with me again in 4 to 6 weeks otherwise.  Thoracic back pain Chronic and seems to be postural related.  Discussed icing regimen and home exercises, discussed which activities to do and which ones to avoid.  Increase activity slowly otherwise.  Follow-up again in 6 to 8 weeks.    Nonallopathic problems  Decision today to treat with OMT was based on Physical Exam  After verbal consent patient was treated with HVLA, ME, FPR techniques in  thoracic, lumbar, and sacral  areas  Patient tolerated the procedure well with improvement  in symptoms  Patient given exercises, stretches and lifestyle modifications  See medications in patient instructions if given  Patient will follow up in 4-8 weeks    The above documentation has been reviewed and is accurate and complete Curtis Pulley, DO          Note: This dictation was prepared with Dragon dictation along with  smaller phrase technology. Any transcriptional errors that result from this process are unintentional.

## 2022-04-06 ENCOUNTER — Ambulatory Visit (INDEPENDENT_AMBULATORY_CARE_PROVIDER_SITE_OTHER): Payer: BC Managed Care – PPO | Admitting: Family Medicine

## 2022-04-06 ENCOUNTER — Encounter: Payer: Self-pay | Admitting: Family Medicine

## 2022-04-06 VITALS — BP 124/84 | Ht 72.0 in | Wt 303.0 lb

## 2022-04-06 DIAGNOSIS — G8929 Other chronic pain: Secondary | ICD-10-CM

## 2022-04-06 DIAGNOSIS — M9903 Segmental and somatic dysfunction of lumbar region: Secondary | ICD-10-CM

## 2022-04-06 DIAGNOSIS — M9904 Segmental and somatic dysfunction of sacral region: Secondary | ICD-10-CM

## 2022-04-06 DIAGNOSIS — H8113 Benign paroxysmal vertigo, bilateral: Secondary | ICD-10-CM | POA: Diagnosis not present

## 2022-04-06 DIAGNOSIS — M546 Pain in thoracic spine: Secondary | ICD-10-CM

## 2022-04-06 DIAGNOSIS — H8110 Benign paroxysmal vertigo, unspecified ear: Secondary | ICD-10-CM | POA: Insufficient documentation

## 2022-04-06 DIAGNOSIS — M9902 Segmental and somatic dysfunction of thoracic region: Secondary | ICD-10-CM

## 2022-04-06 DIAGNOSIS — G4733 Obstructive sleep apnea (adult) (pediatric): Secondary | ICD-10-CM | POA: Diagnosis not present

## 2022-04-06 MED ORDER — PREDNISONE 20 MG PO TABS: 40.0000 mg | ORAL_TABLET | Freq: Every day | ORAL | 0 refills | Status: DC

## 2022-04-06 NOTE — Assessment & Plan Note (Signed)
Chronic and seems to be postural related.  Discussed icing regimen and home exercises, discussed which activities to do and which ones to avoid.  Increase activity slowly otherwise.  Follow-up again in 6 to 8 weeks.

## 2022-04-06 NOTE — Assessment & Plan Note (Signed)
Seems to be more of a benign positional vertigo.  Discussed with patient though that with patient's history of stroke continues to be a problem highly vigilant.  Discussed potential imaging.  Patient was to see how he did.  Patient requested prednisone.  Does have some allergies he states.  Patient knows if any radicular symptoms, worsening discomfort and pain associated with any headaches he needs to seek medical attention immediately.  Patient is in agreement with the plan.  Follow-up with me again in 4 to 6 weeks otherwise.

## 2022-04-06 NOTE — Patient Instructions (Signed)
Flonase each nostril daily for 2 weeks Prednisone '40mg'$  for 5 days See you again in 8 weeks

## 2022-05-04 DIAGNOSIS — G4733 Obstructive sleep apnea (adult) (pediatric): Secondary | ICD-10-CM | POA: Diagnosis not present

## 2022-05-31 NOTE — Progress Notes (Unsigned)
[name] [address] [place] [address] Phone: [phone] Subjective:   Curtis [name], am serving as a scribe for Dr. Antoine Primas.  I'm seeing this patient by the request  of:  Larae Grooms, NP  CC: Upper back pain follow-up  [id number]  Curtis [name] is a 37 y.o. male coming in with complaint of back and neck pain Patient states that he is the same as last visit. Tight in shoulders and neck. Also pain lower thoracic spine. Has catching in his spine. Lost 20# since last visit.   Has been dizzy more often recently.          Reviewed prior external information including notes and imaging from previsou exam, outside providers and external EMR if available.   As well as notes that were available from care everywhere and other healthcare systems.  Past medical history, social, surgical and family history all reviewed in electronic medical record.  No pertanent information unless stated regarding to the chief complaint.   Past Medical History:  Diagnosis Date   Acute cerebrovascular accident (CVA) of medulla oblongata ([facility]) 09/09/2020   a.) small acute infarct of the lateral LEFT medulla in setting of LEFT vertebral artery dissection.   Anxiety    ASD (atrial septal defect) 09/14/2020   a.) TTE 09/14/2020: EF 65-70%; (+) IAS noted on color flow Doppler.   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 POSITIVE muscle aches  Objective  Blood pressure 118/84, pulse 66, height 6' (1.829 m), weight 282 lb (127.9 kg), SpO2 97 %.   General: No apparent distress alert and oriented x3 mood and affect normal, dressed  appropriately.  HEENT: Pupils equal, extraocular movements intact  Respiratory: Patient's speak in full sentences and does not appear short of breath  Cardiovascular: No lower extremity edema, non tender, no erythema  Upper back does have tightness noted in the parascapular area.  Does have some tightness also in the thoracolumbar juncture right greater than left.  Mild tightness with right FABER compared to the contralateral side.  Does have hypermobility of the low back noted.  Osteopathic findings   T3 extended rotated and side bent right inhaled rib T7 extended rotated and side bent left inhaled rib L2 flexed rotated and side bent right Sacrum right on right       Assessment and Plan:  Thoracic back pain Continues to have thoracic back pain.  Patient does have some hypermobility which is surprising with his size.  Patient is going to continue to work on [employer].  Likely nothing that is stopping him from activity.  Still working on weight loss.  Patient is down 30 pounds this year.  Continue to work hard at this time.  I do believe patient will continue to make improvement and follow-up with me again in 6 to 8 weeks    Nonallopathic problems  Decision today to treat with OMT was based on Physical Exam  After verbal consent patient was treated with HVLA, ME, FPR techniques in rib, thoracic, lumbar, and sacral  areas  Patient tolerated the procedure well with improvement in symptoms  Patient given exercises, stretches and lifestyle modifications  See medications in  patient instructions if given  Patient will follow up in 4-8 weeks     The above documentation has been reviewed and is accurate and complete Curtis Pulley, DO         Note: This dictation was prepared with Dragon dictation along with smaller phrase technology. Any transcriptional errors that result from this process are unintentional.

## 2022-06-01 ENCOUNTER — Ambulatory Visit (INDEPENDENT_AMBULATORY_CARE_PROVIDER_SITE_OTHER): Payer: BC Managed Care – PPO | Admitting: Family Medicine

## 2022-06-01 VITALS — BP 118/84 | HR 66 | Ht 72.0 in | Wt 282.0 lb

## 2022-06-01 DIAGNOSIS — G8929 Other chronic pain: Secondary | ICD-10-CM | POA: Diagnosis not present

## 2022-06-01 DIAGNOSIS — M9904 Segmental and somatic dysfunction of sacral region: Secondary | ICD-10-CM

## 2022-06-01 DIAGNOSIS — M546 Pain in thoracic spine: Secondary | ICD-10-CM | POA: Diagnosis not present

## 2022-06-01 DIAGNOSIS — M9908 Segmental and somatic dysfunction of rib cage: Secondary | ICD-10-CM

## 2022-06-01 DIAGNOSIS — M9903 Segmental and somatic dysfunction of lumbar region: Secondary | ICD-10-CM | POA: Diagnosis not present

## 2022-06-01 DIAGNOSIS — M9902 Segmental and somatic dysfunction of thoracic region: Secondary | ICD-10-CM | POA: Diagnosis not present

## 2022-06-02 NOTE — Assessment & Plan Note (Signed)
Continues to have thoracic back pain.  Patient does have some hypermobility which is surprising with his size.  Patient is going to continue to work on [employer].  Likely nothing that is stopping him from activity.  Still working on weight loss.  Patient is down 30 pounds this year.  Continue to work hard at this time.  I do believe patient will continue to make improvement and follow-up with me again in 6 to 8 weeks

## 2022-06-03 DIAGNOSIS — G4733 Obstructive sleep apnea (adult) (pediatric): Secondary | ICD-10-CM | POA: Diagnosis not present

## 2022-07-02 NOTE — Progress Notes (Signed)
BP 120/70   Pulse 60   Temp 97.8 F (36.6 C) (Oral)   Wt 291 lb 12.8 oz (132.4 kg)   SpO2 97%   BMI 39.58 kg/m    Subjective:    Patient ID: Curtis [name], male    DOB: 23-Feb-1985, 37 y.o.   MRN: [id number]  HPI: Curtis [name] is a 37 y.o. male  Chief Complaint  Patient presents with   Hypertension   HYPERTENSION Since patient has been using the CPAP his HR and BP have been improved.   Hypertension status: controlled  Satisfied with current treatment? yes Duration of hypertension: years BP monitoring frequency:  not checking BP range:  BP medication side effects:  no Medication compliance: excellent compliance Previous BP meds:none Aspirin: yes Recurrent headaches: no Visual changes: no Palpitations: no Dyspnea: no Chest pain: no Lower extremity edema: no Dizzy/lightheaded: no  FATIGUE Patient states his fatigue has improved with the CPAP machine.    Relevant past medical, surgical, family and social history reviewed and updated as indicated. Interim medical history since our last visit reviewed. Allergies and medications reviewed and updated.  Review of Systems  Constitutional:  Positive for fatigue.  Eyes:  Negative for visual disturbance.  Respiratory:  Negative for chest tightness and shortness of breath.   Cardiovascular:  Negative for chest pain, palpitations and leg swelling.  Neurological:  Negative for dizziness, light-headedness and headaches.    Per HPI unless specifically indicated above     Objective:    BP 120/70   Pulse 60   Temp 97.8 F (36.6 C) (Oral)   Wt 291 lb 12.8 oz (132.4 kg)   SpO2 97%   BMI 39.58 kg/m   Wt Readings from Last 3 Encounters:  07/03/22 291 lb 12.8 oz (132.4 kg)  06/01/22 282 lb (127.9 kg)  04/06/22 (!) 303 lb (137.4 kg)    Physical Exam Vitals and nursing note reviewed.  Constitutional:      General: He is not in acute distress.    Appearance: Normal appearance. He is obese. He is not  ill-appearing, toxic-appearing or diaphoretic.  HENT:     Head: Normocephalic.     Right Ear: External ear normal.     Left Ear: External ear normal.     Nose: Nose normal. No congestion or rhinorrhea.     Mouth/Throat:     Mouth: Mucous membranes are moist.  Eyes:     General:        Right eye: No discharge.        Left eye: No discharge.     Extraocular Movements: Extraocular movements intact.     Conjunctiva/sclera: Conjunctivae normal.     Pupils: Pupils are equal, round, and reactive to light.  Cardiovascular:     Rate and Rhythm: Normal rate and regular rhythm.     Heart sounds: No murmur heard. Pulmonary:     Effort: Pulmonary effort is normal. No respiratory distress.     Breath sounds: Normal breath sounds. No wheezing, rhonchi or rales.  Abdominal:     General: Abdomen is flat. Bowel sounds are normal.  Musculoskeletal:     Cervical back: Normal range of motion and neck supple.  Skin:    General: Skin is warm and dry.     Capillary Refill: Capillary refill takes less than 2 seconds.  Neurological:     General: No focal deficit present.     Mental Status: He is alert and oriented to person, place, and  time.  Psychiatric:        Mood and Affect: Mood normal.        Behavior: Behavior normal.        Thought Content: Thought content normal.        Judgment: Judgment normal.     Results for orders placed or performed in visit on 01/01/22  TSH  Result Value Ref Range   TSH 1.540 0.450 - 4.500 uIU/mL  Lipid panel  Result Value Ref Range   Cholesterol, Total 150 100 - 199 mg/dL   Triglycerides 70 0 - 149 mg/dL   HDL 50 >16 mg/dL   VLDL Cholesterol Cal 14 5 - 40 mg/dL   LDL Chol Calc (NIH) 86 0 - 99 mg/dL   Chol/HDL Ratio 3.0 0.0 - 5.0 ratio  CBC with Differential/Platelet  Result Value Ref Range   WBC 10.2 3.4 - 10.8 x10E3/uL   RBC 5.60 4.14 - 5.80 x10E6/uL   Hemoglobin 16.6 13.0 - 17.7 g/dL   Hematocrit 10.9 60.4 - 51.0 %   MCV 87 79 - 97 fL   MCH 29.6  26.6 - 33.0 pg   MCHC 34.0 31.5 - 35.7 g/dL   RDW 54.0 98.1 - 19.1 %   Platelets 323 150 - 450 x10E3/uL   Neutrophils 58 Not Estab. %   Lymphs 30 Not Estab. %   Monocytes 8 Not Estab. %   Eos 4 Not Estab. %   Basos 0 Not Estab. %   Neutrophils Absolute 5.9 1.4 - 7.0 x10E3/uL   Lymphocytes Absolute 3.0 0.7 - 3.1 x10E3/uL   Monocytes Absolute 0.8 0.1 - 0.9 x10E3/uL   EOS (ABSOLUTE) 0.5 (H) 0.0 - 0.4 x10E3/uL   Basophils Absolute 0.0 0.0 - 0.2 x10E3/uL   Immature Granulocytes 0 Not Estab. %   Immature Grans (Abs) 0.0 0.0 - 0.1 x10E3/uL  Comprehensive metabolic panel  Result Value Ref Range   Glucose 91 70 - 99 mg/dL   BUN 11 6 - 20 mg/dL   Creatinine, Ser 4.78 0.76 - 1.27 mg/dL   eGFR 295 >62 ZH/YQM/5.78   BUN/Creatinine Ratio 13 9 - 20   Sodium 141 134 - 144 mmol/L   Potassium 4.9 3.5 - 5.2 mmol/L   Chloride 103 96 - 106 mmol/L   CO2 19 (L) 20 - 29 mmol/L   Calcium 9.3 8.7 - 10.2 mg/dL   Total Protein 6.9 6.0 - 8.5 g/dL   Albumin 4.4 4.1 - 5.1 g/dL   Globulin, Total 2.5 1.5 - 4.5 g/dL   Albumin/Globulin Ratio 1.8 1.2 - 2.2   Bilirubin Total 0.3 0.0 - 1.2 mg/dL   Alkaline Phosphatase 101 44 - 121 IU/L   AST 17 0 - 40 IU/L   ALT 13 0 - 44 IU/L  Urinalysis, Routine w reflex microscopic  Result Value Ref Range   Specific Gravity, UA 1.025 1.005 - 1.030   pH, UA 6.0 5.0 - 7.5   Color, UA Yellow Yellow   Appearance Ur Clear Clear   Leukocytes,UA Negative Negative   Protein,UA Negative Negative/Trace   Glucose, UA Negative Negative   Ketones, UA Negative Negative   RBC, UA Negative Negative   Bilirubin, UA Negative Negative   Urobilinogen, Ur 0.2 0.2 - 1.0 mg/dL   Nitrite, UA Negative Negative   Microscopic Examination Comment       Assessment & Plan:   Problem List Items Addressed This Visit       Cardiovascular and Mediastinum   Essential hypertension -  Primary    Chronic.  Controlled without medication.  Labs ordered today.  Return to clinic in 6 months for  reevaluation.  Call sooner if concerns arise.        Relevant Medications   atorvastatin (LIPITOR) 20 MG tablet   Other Relevant Orders   Comp Met (CMET)     Other   Morbid obesity (HCC)    Recommended eating smaller high protein, low fat meals more frequently and exercising 30 mins a day 5 times a week with a goal of 10-15lb weight loss in the next 3 months.        Elevated LDL cholesterol level    Chronic.  Controlled.  Continue with current medication regimen atorvastatin daily.  Labs ordered today.  Return to clinic in 6 months for reevaluation.  Call sooner if concerns arise.        Relevant Orders   Lipid Profile     Follow up plan: No follow-ups on file.

## 2022-07-03 ENCOUNTER — Ambulatory Visit: Payer: BC Managed Care – PPO | Admitting: Nurse Practitioner

## 2022-07-03 ENCOUNTER — Encounter: Payer: Self-pay | Admitting: Nurse Practitioner

## 2022-07-03 VITALS — BP 120/70 | HR 60 | Temp 97.8°F | Wt 291.8 lb

## 2022-07-03 DIAGNOSIS — E7800 Pure hypercholesterolemia, unspecified: Secondary | ICD-10-CM | POA: Diagnosis not present

## 2022-07-03 DIAGNOSIS — I10 Essential (primary) hypertension: Secondary | ICD-10-CM | POA: Diagnosis not present

## 2022-07-03 MED ORDER — FLUOXETINE HCL 40 MG PO CAPS: 40.0000 mg | ORAL_CAPSULE | Freq: Every day | ORAL | 1 refills | Status: DC

## 2022-07-03 MED ORDER — OMEPRAZOLE 20 MG PO CPDR: 20.0000 mg | DELAYED_RELEASE_CAPSULE | Freq: Every day | ORAL | 1 refills | Status: DC

## 2022-07-03 MED ORDER — ATORVASTATIN CALCIUM 20 MG PO TABS: 20.0000 mg | ORAL_TABLET | Freq: Every day | ORAL | 1 refills | Status: DC

## 2022-07-03 NOTE — Assessment & Plan Note (Signed)
Recommended eating smaller high protein, low fat meals more frequently and exercising 30 mins a day 5 times a week with a goal of 10-15lb weight loss in the next 3 months.  

## 2022-07-03 NOTE — Assessment & Plan Note (Signed)
Chronic.  Controlled.  Continue with current medication regimen atorvastatin daily.  Labs ordered today.  Return to clinic in 6 months for reevaluation.  Call sooner if concerns arise.

## 2022-07-03 NOTE — Assessment & Plan Note (Signed)
Chronic.  Controlled without medication..  Labs ordered today.  Return to clinic in 6 months for reevaluation.  Call sooner if concerns arise.  ° °

## 2022-07-04 DIAGNOSIS — G4733 Obstructive sleep apnea (adult) (pediatric): Secondary | ICD-10-CM | POA: Diagnosis not present

## 2022-07-04 LAB — COMPREHENSIVE METABOLIC PANEL
ALT: 13 IU/L (ref 0–44)
AST: 18 IU/L (ref 0–40)
Albumin/Globulin Ratio: 1.9 (ref 1.2–2.2)
Albumin: 4.3 g/dL (ref 4.1–5.1)
Alkaline Phosphatase: 109 IU/L (ref 44–121)
BUN/Creatinine Ratio: 12 (ref 9–20)
BUN: 10 mg/dL (ref 6–20)
Bilirubin Total: 0.3 mg/dL (ref 0.0–1.2)
CO2: 26 mmol/L (ref 20–29)
Calcium: 9.2 mg/dL (ref 8.7–10.2)
Chloride: 104 mmol/L (ref 96–106)
Creatinine, Ser: 0.85 mg/dL (ref 0.76–1.27)
Globulin, Total: 2.3 g/dL (ref 1.5–4.5)
Glucose: 73 mg/dL (ref 70–99)
Potassium: 5.1 mmol/L (ref 3.5–5.2)
Sodium: 143 mmol/L (ref 134–144)
Total Protein: 6.6 g/dL (ref 6.0–8.5)
eGFR: 117 mL/min/{1.73_m2} (ref >59)

## 2022-07-04 LAB — LIPID PANEL
Chol/HDL Ratio: 2.4 ratio (ref 0.0–5.0)
Cholesterol, Total: 121 mg/dL (ref 100–199)
HDL: 50 mg/dL (ref >39)
LDL Chol Calc (NIH): 54 mg/dL (ref 0–99)
Triglycerides: 85 mg/dL (ref 0–149)
VLDL Cholesterol Cal: 17 mg/dL (ref 5–40)

## 2022-07-06 NOTE — Progress Notes (Signed)
Hi Curtis [name]. It was nice to see you last week.  Your lab work looks good.  No concerns at this time. Continue with your current medication regimen.  Follow up as discussed.  Please let me know if you have any questions.

## 2022-07-10 NOTE — Progress Notes (Unsigned)
[name] [address] [place] [address] Phone: [phone] Subjective:   Curtis [name], am serving as a scribe for Dr. Antoine Primas.  I'm seeing this patient by the request  of:  Larae Grooms, NP  CC: Back pain follow up[   [id number]  KYRI CARPIO is a 37 y.o. male coming in with complaint of back  pain. OMT on 06/01/2022. Patient states that his lower and mid-back are tight. Nothing new since last visit.   Medications patient has been prescribed:   Taking:         Reviewed prior external information including notes and imaging from previsou exam, outside providers and external EMR if available.   As well as notes that were available from care everywhere and other healthcare systems.  Past medical history, social, surgical and family history all reviewed in electronic medical record.  No pertanent information unless stated regarding to the chief complaint.   Past Medical History:  Diagnosis Date   Acute cerebrovascular accident (CVA) of medulla oblongata (HCC) 09/09/2020   a.) small acute infarct of the lateral LEFT medulla in setting of LEFT vertebral artery dissection.   Anxiety    ASD (atrial septal defect) 09/14/2020   a.) TTE 09/14/2020: EF 65-70%; (+) IAS noted on color flow Doppler.   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 POSITIVE muscle aches  Objective  Blood pressure 110/82, pulse 67, height 6' (1.829 m), weight 280 lb (127 kg), SpO2 98 %.   General: No apparent distress alert and oriented x3 mood and affect normal, dressed appropriately.  HEENT: Pupils equal, extraocular movements  intact  Respiratory: Patient's speak in full sentences and does not appear short of breath  Cardiovascular: No lower extremity edema, non tender, no erythema  Neck exam does have loss of lordosis noted.  Some tenderness to palpation in the paraspinal musculature.  Tightness with straight leg test bilaterally but still has hypermobility in multiple other areas.  Osteopathic findings  T3 extended rotated and side bent right inhaled rib T6 extended rotated and side bent right T9 extended rotated and side bent left L2 flexed rotated and side bent right Sacrum right on right       Assessment and Plan:  Thoracic back pain Chronic problem with mild exacerbation.  Has been doing a little bit more lifting and was building a playground recently.  Thinks that this could have contributed to significant increase in aches and pains.  Discussed home exercises and icing regimen.  Follow-up again in 6 to 8 weeks    Nonallopathic problems  Decision today to treat with OMT was based on Physical Exam  After verbal consent patient was treated with HVLA, ME, FPR techniques in  rib, thoracic, lumbar, and sacral  areas  Patient tolerated the procedure well with improvement in symptoms  Patient given exercises, stretches and lifestyle modifications  See medications in patient instructions if given  Patient will follow up in 4-8 weeks    The above documentation has been reviewed and is accurate and complete [name], DO          Note: This dictation was prepared with Dragon dictation along with smaller phrase  technology. Any transcriptional errors that result from this process are unintentional.

## 2022-07-13 ENCOUNTER — Ambulatory Visit (INDEPENDENT_AMBULATORY_CARE_PROVIDER_SITE_OTHER): Payer: BC Managed Care – PPO | Admitting: Family Medicine

## 2022-07-13 VITALS — BP 110/82 | HR 67 | Ht 72.0 in | Wt 280.0 lb

## 2022-07-13 DIAGNOSIS — M546 Pain in thoracic spine: Secondary | ICD-10-CM

## 2022-07-13 DIAGNOSIS — M9903 Segmental and somatic dysfunction of lumbar region: Secondary | ICD-10-CM

## 2022-07-13 DIAGNOSIS — M9902 Segmental and somatic dysfunction of thoracic region: Secondary | ICD-10-CM | POA: Diagnosis not present

## 2022-07-13 DIAGNOSIS — M9904 Segmental and somatic dysfunction of sacral region: Secondary | ICD-10-CM

## 2022-07-13 DIAGNOSIS — G8929 Other chronic pain: Secondary | ICD-10-CM

## 2022-07-13 NOTE — Assessment & Plan Note (Signed)
Chronic problem with mild exacerbation.  Has been doing a little bit more lifting and was building a playground recently.  Thinks that this could have contributed to significant increase in aches and pains.  Discussed home exercises and icing regimen.  Follow-up again in 6 to 8 weeks

## 2022-08-03 ENCOUNTER — Ambulatory Visit (INDEPENDENT_AMBULATORY_CARE_PROVIDER_SITE_OTHER): Payer: BC Managed Care – PPO | Admitting: Pulmonary Disease

## 2022-08-03 ENCOUNTER — Encounter: Payer: Self-pay | Admitting: Pulmonary Disease

## 2022-08-03 VITALS — BP 118/78 | HR 61 | Ht 72.0 in | Wt 293.0 lb

## 2022-08-03 DIAGNOSIS — G4733 Obstructive sleep apnea (adult) (pediatric): Secondary | ICD-10-CM | POA: Diagnosis not present

## 2022-08-03 NOTE — Progress Notes (Unsigned)
Curtis [name]    [id number]    May 02, 1985  Primary Care Physician:Holdsworth, Clydie Braun, NP  Referring Physician: Larae Grooms, NP [address] [place],  [place] [address]  Chief complaint:   Patient being seen for obstructive sleep apnea  HPI:  Recently had a home sleep study showing severe obstructive sleep apnea with an AHI of 70  Has been using CPAP for about a month now and tolerating it well Compliance from CPAP shows nightly use, residual AHI 1.1  Appears to be tolerating it well with no significant issues  Did have snoring, nonrestorative sleep Now wakes up feeling like he is at a good nights rest  No morning headaches No night sweats No driving issues  Poor short-term memory  Usually goes to bed about 11, takes about 5 to 10 minutes to fall asleep 2-3 awakenings Final wake up time about 6:30 in the morning  Had a stroke about a year ago contributed to significant weight gain  Reformed smoker quit about last year-smoking cigars  No pertinent occupational risk, works as a [employer]   Outpatient Encounter Medications as of 08/03/2022  Medication Sig   aspirin EC 81 MG tablet Take 81 mg by mouth daily. Swallow whole.   atorvastatin (LIPITOR) 20 MG tablet Take 1 tablet (20 mg total) by mouth daily.   FLUoxetine (PROZAC) 40 MG capsule Take 1 capsule (40 mg total) by mouth daily.   fluticasone (FLONASE) 50 MCG/ACT nasal spray Place 2 sprays into both nostrils daily.   omeprazole (PRILOSEC) 20 MG capsule Take 1 capsule (20 mg total) by mouth daily.   ondansetron (ZOFRAN-ODT) 4 MG disintegrating tablet Take 1 tablet (4 mg total) by mouth every 8 (eight) hours as needed for nausea or vomiting.   No facility-administered encounter medications on file as of 08/03/2022.    Allergies as of 08/03/2022   (No Known Allergies)    Past Medical History:  Diagnosis Date   Acute cerebrovascular accident (CVA) of medulla oblongata (HCC) 09/09/2020    a.) small acute infarct of the lateral LEFT medulla in setting of LEFT vertebral artery dissection.   Anxiety    ASD (atrial septal defect) 09/14/2020   a.) TTE 09/14/2020: EF 65-70%; (+) IAS noted on color flow Doppler.   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    Past Surgical History:  Procedure Laterality Date   GALLBLADDER SURGERY  01/23/2021   removed    Family History  Problem Relation Age of Onset   COPD Maternal Grandmother    Arthritis Maternal Grandmother    Alcohol abuse Maternal Grandfather    Heart attack Maternal Grandfather     Social History   Socioeconomic History   Marital status: Married    Spouse name: Not on file   Number of children: Not on file   Years of education: Not on file   Highest education level: Not on file  Occupational History   Not on file  Tobacco Use   Smoking status: Former    Types: Cigars   Smokeless tobacco: Never  Vaping Use   Vaping Use: Former  Substance and Sexual Activity   Alcohol use: Yes    Alcohol/week: 2.0 standard drinks of alcohol    Types: 2 Shots of liquor per week   Drug use: Never   Sexual activity: Yes  Other Topics Concern   Not on file  Social History Narrative   Not on  file   Social Determinants of Health   Financial Resource Strain: Patient Declined (06/30/2022)   Overall Financial Resource Strain (CARDIA)    Difficulty of Paying Living Expenses: Patient declined  Food Insecurity: Patient Declined (06/30/2022)   Hunger Vital Sign    Worried About Running Out of Food in the Last Year: Patient declined    Ran Out of Food in the Last Year: Patient declined  Transportation Needs: Patient Declined (06/30/2022)   PRAPARE - Administrator, Civil Service (Medical): Patient declined    Lack of Transportation (Non-Medical): Patient declined  Physical Activity: Unknown (06/30/2022)   Exercise Vital Sign    Days of Exercise per Week: 0 days    Minutes of Exercise per  Session: Not on file  Stress: No Stress Concern Present (06/30/2022)   [employer] of Occupational Health - Occupational Stress Questionnaire    Feeling of Stress : Only a little  Social Connections: Unknown (06/30/2022)   Social Connection and Isolation Panel [NHANES]    Frequency of Communication with Friends and Family: Patient declined    Frequency of Social Gatherings with Friends and Family: Patient declined    Attends Religious Services: Patient declined    Database administrator or Organizations: Patient declined    Attends Engineer, structural: Not on file    Marital Status: Patient declined  Intimate Partner Violence: Not on file    Review of Systems  Constitutional:  Positive for fatigue.  Respiratory:  Positive for apnea.   Psychiatric/Behavioral:  Positive for sleep disturbance.     Vitals:   08/03/22 1358  BP: 118/78  Pulse: 61  SpO2: 97%     Physical Exam Constitutional:      Appearance: He is obese.  HENT:     Mouth/Throat:     Mouth: Mucous membranes are moist.     Comments: Mallampati 3, crowded oropharynx, neck size 18 Eyes:     General: No scleral icterus. Cardiovascular:     Rate and Rhythm: Normal rate and regular rhythm.     Heart sounds: No murmur heard.    No friction rub.  Pulmonary:     Effort: No respiratory distress.     Breath sounds: No stridor. No wheezing or rhonchi.  Musculoskeletal:     Cervical back: No rigidity or tenderness.  Neurological:     Mental Status: He is alert.  Psychiatric:        Mood and Affect: Mood normal.     Epworth Sleepiness Scale of 6  Data Reviewed: Sleep study results from 07/21/2021 reviewed with the patient showing AHI of 70   Compliance shows excellent compliance Using it nightly Set between 5 and 15 Residual AHI 1.1  Assessment:  Severe obstructive sleep apnea with oxygen desaturations On CPAP therapy and tolerating CPAP well  Class III obesity  Sleep quality is  improved Pathophysiology of sleep disordered breathing reviewed with patient Treatment options reviewed with the patient  Plan/Recommendations: Continue CPAP therapy  Graded activities as tolerated  Follow-up in 6 months  Encouraged to call with significant concerns   Virl Diamond MD [facility] 08/03/2022, 2:08 PM  CC: Larae Grooms, NP

## 2022-08-03 NOTE — Patient Instructions (Signed)
I will see you in about 6 to 8 months  Continue using CPAP  Will send a prescription to the medical supply company to assist you with trialing a new mask  Call us with significant concerns

## 2022-08-18 DIAGNOSIS — G4733 Obstructive sleep apnea (adult) (pediatric): Secondary | ICD-10-CM | POA: Diagnosis not present

## 2022-09-03 DIAGNOSIS — G4733 Obstructive sleep apnea (adult) (pediatric): Secondary | ICD-10-CM | POA: Diagnosis not present

## 2022-09-03 NOTE — Progress Notes (Signed)
[name] [address] [place] [address] Phone: [phone] Subjective:   Bruce Donath, am serving as a scribe for Dr. Antoine Primas. I'm seeing this patient by the request  of:  Larae Grooms, NP  CC: Back and neck pain follow-up  [id number]  Curtis [name] is a 37 y.o. male coming in with complaint of back and neck pain. OMT on 07/13/2022. Patient states that his neck, thoracic, and shoulders are painful.   Medications patient has been prescribed:   Taking:         Reviewed prior external information including notes and imaging from previsou exam, outside providers and external EMR if available.  Reviewing patient's note was seen for address obstructive sleep apnea  As well as notes that were available from care everywhere and other healthcare systems.  Past medical history, social, surgical and family history all reviewed in electronic medical record.  No pertanent information unless stated regarding to the chief complaint.   Past Medical History:  Diagnosis Date   Acute cerebrovascular accident (CVA) of medulla oblongata (HCC) 09/09/2020   a.) small acute infarct of the lateral LEFT medulla in setting of LEFT vertebral artery dissection.   Anxiety    ASD (atrial septal defect) 09/14/2020   a.) TTE 09/14/2020: EF 65-70%; (+) IAS noted on color flow Doppler.   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 POSITIVE muscle aches  Objective  Blood pressure 118/74, pulse (!) 55, height 6' (1.829 m), weight 289 lb (131.1 kg), SpO2 98%.   General: No apparent distress alert and oriented x3 mood and affect normal,  dressed appropriately.  HEENT: Pupils equal, extraocular movements intact  Respiratory: Patient's speak in full sentences and does not appear short of breath  Cardiovascular: No lower extremity edema, non tender, no erythema  MSK:  Back does have some loss lordosis.  Some tenderness to palpation in paraspinal musculature of the thoracic spine.  Osteopathic findings  T3 extended rotated and side bent right inhaled rib T9 extended rotated and side bent left L2 flexed rotated and side bent right Sacrum right on right     Assessment and Plan:  Thoracic back pain Chronic problem but doing much better with patient losing weight, discussed icing regimen and home exercises.  Discussed which activities to do and which ones to avoid.  Increase activity slowly.  Discussed home exercises.  Will follow-up again in 6 to 8 weeks otherwise.    Nonallopathic problems  Decision today to treat with OMT was based on Physical Exam  After verbal consent patient was treated with HVLA, ME, FPR techniques in  rib, thoracic, lumbar, and sacral  areas  Patient tolerated the procedure well with improvement in symptoms  Patient given exercises, stretches and lifestyle modifications  See medications in patient instructions if given  Patient will follow up in 4-8 weeks     The above documentation has been reviewed and is accurate and complete [name], DO         Note: This dictation was prepared with Dragon dictation along with smaller phrase technology. Any transcriptional errors that result from this process are unintentional.

## 2022-09-07 ENCOUNTER — Encounter: Payer: Self-pay | Admitting: Family Medicine

## 2022-09-07 ENCOUNTER — Ambulatory Visit (INDEPENDENT_AMBULATORY_CARE_PROVIDER_SITE_OTHER): Payer: BC Managed Care – PPO | Admitting: Family Medicine

## 2022-09-07 VITALS — BP 118/74 | HR 55 | Ht 72.0 in | Wt 289.0 lb

## 2022-09-07 DIAGNOSIS — G8929 Other chronic pain: Secondary | ICD-10-CM

## 2022-09-07 DIAGNOSIS — M9902 Segmental and somatic dysfunction of thoracic region: Secondary | ICD-10-CM

## 2022-09-07 DIAGNOSIS — M9903 Segmental and somatic dysfunction of lumbar region: Secondary | ICD-10-CM | POA: Diagnosis not present

## 2022-09-07 DIAGNOSIS — M9908 Segmental and somatic dysfunction of rib cage: Secondary | ICD-10-CM

## 2022-09-07 DIAGNOSIS — M546 Pain in thoracic spine: Secondary | ICD-10-CM | POA: Diagnosis not present

## 2022-09-07 NOTE — Assessment & Plan Note (Signed)
Chronic problem but doing much better with patient losing weight, discussed icing regimen and home exercises.  Discussed which activities to do and which ones to avoid.  Increase activity slowly.  Discussed home exercises.  Will follow-up again in 6 to 8 weeks otherwise.

## 2022-10-04 DIAGNOSIS — G4733 Obstructive sleep apnea (adult) (pediatric): Secondary | ICD-10-CM | POA: Diagnosis not present

## 2022-10-15 DIAGNOSIS — F411 Generalized anxiety disorder: Secondary | ICD-10-CM | POA: Diagnosis not present

## 2022-10-29 DIAGNOSIS — F411 Generalized anxiety disorder: Secondary | ICD-10-CM | POA: Diagnosis not present

## 2022-10-30 NOTE — Progress Notes (Unsigned)
[name] [address] [place] [address] Phone: [phone] Subjective:   [name], am serving as a scribe for Dr. Antoine Primas.  I'm seeing this patient by the request  of:  Larae Grooms, NP  CC: back and neck pain follow up   [id number]  Curtis [name] is a 37 y.o. male coming in with complaint of back and neck pain. OMT on 09/07/2022. Patient states same per usual. Just super tight.  Medications patient has been prescribed:   Taking:         Reviewed prior external information including notes and imaging from previsou exam, outside providers and external EMR if available.   As well as notes that were available from care everywhere and other healthcare systems.  Past medical history, social, surgical and family history all reviewed in electronic medical record.  No pertanent information unless stated regarding to the chief complaint.   Past Medical History:  Diagnosis Date   Acute cerebrovascular accident (CVA) of medulla oblongata (HCC) 09/09/2020   a.) small acute infarct of the lateral LEFT medulla in setting of LEFT vertebral artery dissection.   Anxiety    ASD (atrial septal defect) 09/14/2020   a.) TTE 09/14/2020: EF 65-70%; (+) IAS noted on color flow Doppler.   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 POSITIVE muscle aches  Objective  Blood pressure 114/70, pulse 66, height 6' (1.829 m), weight 278 lb (126.1 kg), SpO2 98%.   General: No apparent distress alert and oriented x3 mood and affect normal, dressed appropriately.  HEENT: Pupils equal, extraocular movements intact  Respiratory:  Patient's speak in full sentences and does not appear short of breath  Cardiovascular: No lower extremity edema, non tender, no erythema  MSK:  Back does have some loss lordosis noted.  Some tightness noted with FABER test.  Significant tightness noted in the thoracolumbar juncture still noted.  Some tightness in the lower back also noted today that is different than usual.  Osteopathic findings T3 extended rotated and side bent right inhaled rib T8 extended rotated and side bent left L3 flexed rotated and side bent right Sacrum right on right     Assessment and Plan:  Thoracic back pain Continues to have tightness in the area.  Poor posture noted.  Responds well though to osteopathic manipulation.  Continuing to work on the weight loss and has done significantly better though.BMI is under 40 continue to be active.  Patient will be expecting a child in multiple months.  Follow-up again 6 to 8 weeks otherwise.    Nonallopathic problems  Decision today to treat with OMT was based on Physical Exam  After verbal consent patient was treated with HVLA, ME, FPR techniques in rib, thoracic, lumbar, and sacral  areas  Patient tolerated the procedure well with improvement in symptoms  Patient given exercises, stretches and lifestyle modifications  See medications in patient instructions if given  Patient will follow up in 4-8 weeks     The above documentation has been reviewed and is accurate and complete [name], DO         Note: This dictation was prepared with Dragon dictation along with smaller  Lobbyist. Any transcriptional errors that result from this process are unintentional.

## 2022-11-02 ENCOUNTER — Encounter: Payer: Self-pay | Admitting: Family Medicine

## 2022-11-02 ENCOUNTER — Ambulatory Visit (INDEPENDENT_AMBULATORY_CARE_PROVIDER_SITE_OTHER): Payer: BC Managed Care – PPO | Admitting: Family Medicine

## 2022-11-02 VITALS — BP 114/70 | HR 66 | Ht 72.0 in | Wt 278.0 lb

## 2022-11-02 DIAGNOSIS — M9908 Segmental and somatic dysfunction of rib cage: Secondary | ICD-10-CM | POA: Diagnosis not present

## 2022-11-02 DIAGNOSIS — M9903 Segmental and somatic dysfunction of lumbar region: Secondary | ICD-10-CM | POA: Diagnosis not present

## 2022-11-02 DIAGNOSIS — M9904 Segmental and somatic dysfunction of sacral region: Secondary | ICD-10-CM | POA: Diagnosis not present

## 2022-11-02 DIAGNOSIS — M9902 Segmental and somatic dysfunction of thoracic region: Secondary | ICD-10-CM

## 2022-11-02 DIAGNOSIS — G8929 Other chronic pain: Secondary | ICD-10-CM

## 2022-11-02 DIAGNOSIS — M546 Pain in thoracic spine: Secondary | ICD-10-CM

## 2022-11-02 NOTE — Assessment & Plan Note (Signed)
Continues to have tightness in the area.  Poor posture noted.  Responds well though to osteopathic manipulation.  Continuing to work on the weight loss and has done significantly better though.BMI is under 40 continue to be active.  Patient will be expecting a child in multiple months.  Follow-up again 6 to 8 weeks otherwise.

## 2022-11-12 DIAGNOSIS — F411 Generalized anxiety disorder: Secondary | ICD-10-CM | POA: Diagnosis not present

## 2022-11-26 DIAGNOSIS — F411 Generalized anxiety disorder: Secondary | ICD-10-CM | POA: Diagnosis not present

## 2022-12-07 DIAGNOSIS — F411 Generalized anxiety disorder: Secondary | ICD-10-CM | POA: Diagnosis not present

## 2022-12-18 NOTE — Progress Notes (Unsigned)
  [name] [address] [place] [address] Phone: [phone] Subjective:   [name], am serving as a scribe for Dr. Antoine Primas.  I'm seeing this patient by the request  of:  Larae Grooms, NP  CC: Back and leg pain  [id number]  Curtis [name] is a 37 y.o. male coming in with complaint of back and neck pain. OMT on 11/02/2022. Patient states same per usual. Has some questions about knees. Restless leg?  Medications patient has been prescribed:   Taking:         Reviewed prior external information including notes and imaging from previsou exam, outside providers and external EMR if available.   As well as notes that were available from care everywhere and other healthcare systems.  Past medical history, social, surgical and family history all reviewed in electronic medical record.  No pertanent information unless stated regarding to the chief complaint.   Past Medical History:  Diagnosis Date   Acute cerebrovascular accident (CVA) of medulla oblongata (HCC) 09/09/2020   a.) small acute infarct of the lateral LEFT medulla in setting of LEFT vertebral artery dissection.   Anxiety    ASD (atrial septal defect) 09/14/2020   a.) TTE 09/14/2020: EF 65-70%; (+) IAS noted on color flow Doppler.   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 POSITIVE muscle aches  Objective  Blood pressure 114/84, pulse 65, height 6' (1.829 m), weight 263 lb (119.3 kg), SpO2 99%.   General: No apparent distress alert and oriented x3 mood and affect normal, dressed appropriately.  HEENT: Pupils equal, extraocular movements intact   Respiratory: Patient's speak in full sentences and does not appear short of breath  Cardiovascular: No lower extremity edema, non tender, no erythema  Gait MSK:  Back does have some loss lordosis noted.  Some tenderness to palpation noted.  Osteopathic findings  T3 extended rotated and side bent right inhaled rib T9 extended rotated and side bent left L2 flexed rotated and side bent right Sacrum right on right     Assessment and Plan:  Thoracic back pain Patient has done well with the weight loss.  Will need to consider starting to increase some of the other activities.  Patient wants to consider the possibility of doing some home exercises that I think will be beneficial.  Increase activity slowly otherwise.  Follow-up again in 6 to 8 weeks.    Nonallopathic problems  Decision today to treat with OMT was based on Physical Exam  After verbal consent patient was treated with HVLA, ME, FPR techniques in rib, thoracic, lumbar, and sacral  areas  Patient tolerated the procedure well with improvement in symptoms  Patient given exercises, stretches and lifestyle modifications  See medications in patient instructions if given  Patient will follow up in 4-8 weeks    The above documentation has been reviewed and is accurate and complete [name], DO          Note: This dictation was prepared with Dragon dictation along with smaller phrase technology. Any transcriptional errors that result from this process are unintentional.

## 2022-12-28 ENCOUNTER — Ambulatory Visit (INDEPENDENT_AMBULATORY_CARE_PROVIDER_SITE_OTHER): Payer: BC Managed Care – PPO | Admitting: Family Medicine

## 2022-12-28 ENCOUNTER — Encounter: Payer: Self-pay | Admitting: Family Medicine

## 2022-12-28 VITALS — BP 114/84 | HR 65 | Ht 72.0 in | Wt 263.0 lb

## 2022-12-28 DIAGNOSIS — M546 Pain in thoracic spine: Secondary | ICD-10-CM

## 2022-12-28 DIAGNOSIS — M79604 Pain in right leg: Secondary | ICD-10-CM

## 2022-12-28 DIAGNOSIS — M79605 Pain in left leg: Secondary | ICD-10-CM

## 2022-12-28 DIAGNOSIS — M9904 Segmental and somatic dysfunction of sacral region: Secondary | ICD-10-CM

## 2022-12-28 DIAGNOSIS — M9903 Segmental and somatic dysfunction of lumbar region: Secondary | ICD-10-CM

## 2022-12-28 DIAGNOSIS — M9902 Segmental and somatic dysfunction of thoracic region: Secondary | ICD-10-CM | POA: Diagnosis not present

## 2022-12-28 DIAGNOSIS — G8929 Other chronic pain: Secondary | ICD-10-CM

## 2022-12-28 DIAGNOSIS — M79606 Pain in leg, unspecified: Secondary | ICD-10-CM | POA: Insufficient documentation

## 2022-12-28 NOTE — Assessment & Plan Note (Signed)
Patient has done well with the weight loss.  Will need to consider starting to increase some of the other activities.  Patient wants to consider the possibility of doing some home exercises that I think will be beneficial.  Increase activity slowly otherwise.  Follow-up again in 6 to 8 weeks.

## 2022-12-28 NOTE — Assessment & Plan Note (Signed)
Leg pain noted to be multifactorial.  He has seen primary care provider next week and we discussed different labs that I think will be more beneficial.  Follow-up with me again after labs and see if anything else needs to be changed

## 2023-01-04 ENCOUNTER — Encounter: Payer: Self-pay | Admitting: Nurse Practitioner

## 2023-01-04 ENCOUNTER — Ambulatory Visit: Payer: BC Managed Care – PPO | Admitting: Nurse Practitioner

## 2023-01-04 VITALS — BP 129/88 | HR 68 | Temp 98.4°F | Ht 72.0 in | Wt 265.0 lb

## 2023-01-04 DIAGNOSIS — R42 Dizziness and giddiness: Secondary | ICD-10-CM

## 2023-01-04 DIAGNOSIS — I7774 Dissection of vertebral artery: Secondary | ICD-10-CM

## 2023-01-04 DIAGNOSIS — I10 Essential (primary) hypertension: Secondary | ICD-10-CM | POA: Diagnosis not present

## 2023-01-04 DIAGNOSIS — E7800 Pure hypercholesterolemia, unspecified: Secondary | ICD-10-CM | POA: Diagnosis not present

## 2023-01-04 DIAGNOSIS — Z23 Encounter for immunization: Secondary | ICD-10-CM | POA: Diagnosis not present

## 2023-01-04 DIAGNOSIS — R5383 Other fatigue: Secondary | ICD-10-CM

## 2023-01-04 MED ORDER — OMEPRAZOLE 20 MG PO CPDR: 20.0000 mg | DELAYED_RELEASE_CAPSULE | Freq: Every day | ORAL | 1 refills | Status: DC

## 2023-01-04 MED ORDER — FLUOXETINE HCL 40 MG PO CAPS: 40.0000 mg | ORAL_CAPSULE | Freq: Every day | ORAL | 1 refills | Status: DC

## 2023-01-04 MED ORDER — ATORVASTATIN CALCIUM 20 MG PO TABS: 20.0000 mg | ORAL_TABLET | Freq: Every day | ORAL | 1 refills | Status: DC

## 2023-01-04 NOTE — Assessment & Plan Note (Signed)
Has been on weight loss journey and lost about 65lbs since this time last year.  Encouraged to eat more protein to help fuel his body.

## 2023-01-04 NOTE — Assessment & Plan Note (Signed)
Occurred in 2022.  Patient is on atorvastatin and ASA. Continue with current therapy.

## 2023-01-04 NOTE — Progress Notes (Signed)
BP 129/88 (BP Location: Left Arm, Patient Position: Sitting, Cuff Size: Large)   Pulse 68   Temp 98.4 F (36.9 C) (Oral)   Ht 6' (1.829 m)   Wt 265 lb (120.2 kg)   SpO2 98%   BMI 35.94 kg/m    Subjective:    Patient ID: Curtis [name], male    DOB: 1985/03/19, 37 y.o.   MRN: [id number]  HPI: Curtis [name] is a 37 y.o. male  Chief Complaint  Patient presents with   6 month follow up   Dizziness   Hypertension   Hyperlipidemia   HYPERTENSION Since patient has been using the CPAP his HR and BP have been improved.   Hypertension status: controlled  Satisfied with current treatment? yes Duration of hypertension: years BP monitoring frequency:  not checking BP range:  BP medication side effects:  no Medication compliance: excellent compliance Previous BP meds:none Aspirin: yes Recurrent headaches: no Visual changes: no Palpitations: no Dyspnea: no Chest pain: no Lower extremity edema: no Dizzy/lightheaded: yes  Patient states he has lost 65lbs since his last visit.  Has noticed some dizziness. He eats one meal per day and a few snacks.     Relevant past medical, surgical, family and social history reviewed and updated as indicated. Interim medical history since our last visit reviewed. Allergies and medications reviewed and updated.  Review of Systems  Eyes:  Negative for visual disturbance.  Respiratory:  Negative for chest tightness and shortness of breath.   Cardiovascular:  Negative for chest pain, palpitations and leg swelling.  Neurological:  Positive for dizziness. Negative for light-headedness and headaches.    Per HPI unless specifically indicated above     Objective:    BP 129/88 (BP Location: Left Arm, Patient Position: Sitting, Cuff Size: Large)   Pulse 68   Temp 98.4 F (36.9 C) (Oral)   Ht 6' (1.829 m)   Wt 265 lb (120.2 kg)   SpO2 98%   BMI 35.94 kg/m   Wt Readings from Last 3 Encounters:  01/04/23 265 lb (120.2 kg)  12/28/22 263  lb (119.3 kg)  11/02/22 278 lb (126.1 kg)    Physical Exam Vitals and nursing note reviewed.  Constitutional:      General: He is not in acute distress.    Appearance: Normal appearance. He is obese. He is not ill-appearing, toxic-appearing or diaphoretic.  HENT:     Head: Normocephalic.     Right Ear: External ear normal.     Left Ear: External ear normal.     Nose: Nose normal. No congestion or rhinorrhea.     Mouth/Throat:     Mouth: Mucous membranes are moist.  Eyes:     General:        Right eye: No discharge.        Left eye: No discharge.     Extraocular Movements: Extraocular movements intact.     Conjunctiva/sclera: Conjunctivae normal.     Pupils: Pupils are equal, round, and reactive to light.  Cardiovascular:     Rate and Rhythm: Normal rate and regular rhythm.     Heart sounds: No murmur heard. Pulmonary:     Effort: Pulmonary effort is normal. No respiratory distress.     Breath sounds: Normal breath sounds. No wheezing, rhonchi or rales.  Abdominal:     General: Abdomen is flat. Bowel sounds are normal.  Musculoskeletal:     Cervical back: Normal range of motion and neck supple.  Skin:  General: Skin is warm and dry.     Capillary Refill: Capillary refill takes less than 2 seconds.  Neurological:     General: No focal deficit present.     Mental Status: He is alert and oriented to person, place, and time.  Psychiatric:        Mood and Affect: Mood normal.        Behavior: Behavior normal.        Thought Content: Thought content normal.        Judgment: Judgment normal.     Results for orders placed or performed in visit on 07/03/22  Comp Met (CMET)  Result Value Ref Range   Glucose 73 70 - 99 mg/dL   BUN 10 6 - 20 mg/dL   Creatinine, Ser 1.61 0.76 - 1.27 mg/dL   eGFR 096 >04 VW/UJW/1.19   BUN/Creatinine Ratio 12 9 - 20   Sodium 143 134 - 144 mmol/L   Potassium 5.1 3.5 - 5.2 mmol/L   Chloride 104 96 - 106 mmol/L   CO2 26 20 - 29 mmol/L    Calcium 9.2 8.7 - 10.2 mg/dL   Total Protein 6.6 6.0 - 8.5 g/dL   Albumin 4.3 4.1 - 5.1 g/dL   Globulin, Total 2.3 1.5 - 4.5 g/dL   Albumin/Globulin Ratio 1.9 1.2 - 2.2   Bilirubin Total 0.3 0.0 - 1.2 mg/dL   Alkaline Phosphatase 109 44 - 121 IU/L   AST 18 0 - 40 IU/L   ALT 13 0 - 44 IU/L  Lipid Profile  Result Value Ref Range   Cholesterol, Total 121 100 - 199 mg/dL   Triglycerides 85 0 - 149 mg/dL   HDL 50 >14 mg/dL   VLDL Cholesterol Cal 17 5 - 40 mg/dL   LDL Chol Calc (NIH) 54 0 - 99 mg/dL   Chol/HDL Ratio 2.4 0.0 - 5.0 ratio      Assessment & Plan:   Problem List Items Addressed This Visit       Cardiovascular and Mediastinum   Vertebral artery dissection (HCC) - Primary    Occurred in 2022.  Patient is on atorvastatin and ASA. Continue with current therapy.      Relevant Medications   atorvastatin (LIPITOR) 20 MG tablet   Essential hypertension   Relevant Medications   atorvastatin (LIPITOR) 20 MG tablet   Other Relevant Orders   Comp Met (CMET)     Other   Morbid obesity (HCC)    Has been on weight loss journey and lost about 65lbs since this time last year.  Encouraged to eat more protein to help fuel his body.       Elevated LDL cholesterol level    Chronic.  Controlled.  Continue with current medication regimen.  Labs ordered today.  Return to clinic in 6 months for reevaluation.  Call sooner if concerns arise.        Relevant Orders   Lipid Profile   Other Visit Diagnoses     Need for influenza vaccination       Relevant Orders   Flu vaccine trivalent PF, 6mos and older(Flulaval,Afluria,Fluarix,Fluzone) (Completed)   Dizziness       Anemia panel and testosterone checked at visit today.  Patient encouraged to increase water intake and eat more protein. Will await lab results.   Relevant Orders   Testosterone, free, total([facility])   Anemia Profile B   Other fatigue       Relevant Orders   Testosterone, free, total([facility])  Follow up plan: Return in about 6 months (around 07/05/2023) for Physical and Fasting labs.

## 2023-01-04 NOTE — Assessment & Plan Note (Signed)
Chronic.  Controlled.  Continue with current medication regimen.  Labs ordered today.  Return to clinic in 6 months for reevaluation.  Call sooner if concerns arise.  ? ?

## 2023-01-06 LAB — LIPID PANEL
Chol/HDL Ratio: 3.3 {ratio} (ref 0.0–5.0)
Cholesterol, Total: 173 mg/dL (ref 100–199)
HDL: 52 mg/dL (ref >39)
LDL Chol Calc (NIH): 106 mg/dL — ABNORMAL HIGH (ref 0–99)
Triglycerides: 82 mg/dL (ref 0–149)
VLDL Cholesterol Cal: 15 mg/dL (ref 5–40)

## 2023-01-06 LAB — ANEMIA PROFILE B
Basophils Absolute: 0.1 10*3/uL (ref 0.0–0.2)
Basos: 1 %
EOS (ABSOLUTE): 0.3 10*3/uL (ref 0.0–0.4)
Eos: 3 %
Ferritin: 109 ng/mL (ref 30–400)
Folate: 2.2 ng/mL — ABNORMAL LOW (ref >3.0)
Hematocrit: 49.5 % (ref 37.5–51.0)
Hemoglobin: 16.5 g/dL (ref 13.0–17.7)
Immature Grans (Abs): 0 10*3/uL (ref 0.0–0.1)
Immature Granulocytes: 0 %
Iron Saturation: 38 % (ref 15–55)
Iron: 98 ug/dL (ref 38–169)
Lymphocytes Absolute: 3 10*3/uL (ref 0.7–3.1)
Lymphs: 32 %
MCH: 29.3 pg (ref 26.6–33.0)
MCHC: 33.3 g/dL (ref 31.5–35.7)
MCV: 88 fL (ref 79–97)
Monocytes Absolute: 0.8 10*3/uL (ref 0.1–0.9)
Monocytes: 9 %
Neutrophils Absolute: 5.1 10*3/uL (ref 1.4–7.0)
Neutrophils: 55 %
Platelets: 294 10*3/uL (ref 150–450)
RBC: 5.64 x10E6/uL (ref 4.14–5.80)
RDW: 13 % (ref 11.6–15.4)
Retic Ct Pct: 1 % (ref 0.6–2.6)
Total Iron Binding Capacity: 257 ug/dL (ref 250–450)
UIBC: 159 ug/dL (ref 111–343)
Vitamin B-12: 360 pg/mL (ref 232–1245)
WBC: 9.2 10*3/uL (ref 3.4–10.8)

## 2023-01-06 LAB — COMPREHENSIVE METABOLIC PANEL
ALT: 13 [IU]/L (ref 0–44)
AST: 20 [IU]/L (ref 0–40)
Albumin: 4.5 g/dL (ref 4.1–5.1)
Alkaline Phosphatase: 102 [IU]/L (ref 44–121)
BUN/Creatinine Ratio: 11 (ref 9–20)
BUN: 9 mg/dL (ref 6–20)
Bilirubin Total: 0.5 mg/dL (ref 0.0–1.2)
CO2: 23 mmol/L (ref 20–29)
Calcium: 9.5 mg/dL (ref 8.7–10.2)
Chloride: 103 mmol/L (ref 96–106)
Creatinine, Ser: 0.82 mg/dL (ref 0.76–1.27)
Globulin, Total: 2.4 g/dL (ref 1.5–4.5)
Glucose: 79 mg/dL (ref 70–99)
Potassium: 4.6 mmol/L (ref 3.5–5.2)
Sodium: 138 mmol/L (ref 134–144)
Total Protein: 6.9 g/dL (ref 6.0–8.5)
eGFR: 117 mL/min/{1.73_m2} (ref >59)

## 2023-01-06 LAB — TESTOSTERONE, FREE, TOTAL, SHBG
Sex Hormone Binding: 37.5 nmol/L (ref 16.5–55.9)
Testosterone, Free: 7.6 pg/mL — ABNORMAL LOW (ref 8.7–25.1)
Testosterone: 398 ng/dL (ref 264–916)

## 2023-01-08 DIAGNOSIS — G4733 Obstructive sleep apnea (adult) (pediatric): Secondary | ICD-10-CM | POA: Diagnosis not present

## 2023-01-13 DIAGNOSIS — F411 Generalized anxiety disorder: Secondary | ICD-10-CM | POA: Diagnosis not present

## 2023-01-28 DIAGNOSIS — F411 Generalized anxiety disorder: Secondary | ICD-10-CM | POA: Diagnosis not present

## 2023-01-29 ENCOUNTER — Encounter: Payer: Self-pay | Admitting: Family Medicine

## 2023-02-02 ENCOUNTER — Encounter: Payer: Self-pay | Admitting: Family Medicine

## 2023-02-02 ENCOUNTER — Ambulatory Visit (INDEPENDENT_AMBULATORY_CARE_PROVIDER_SITE_OTHER): Payer: BC Managed Care – PPO | Admitting: Family Medicine

## 2023-02-02 VITALS — BP 122/88 | HR 78 | Ht 72.0 in | Wt 269.0 lb

## 2023-02-02 DIAGNOSIS — M9902 Segmental and somatic dysfunction of thoracic region: Secondary | ICD-10-CM

## 2023-02-02 DIAGNOSIS — M546 Pain in thoracic spine: Secondary | ICD-10-CM | POA: Diagnosis not present

## 2023-02-02 DIAGNOSIS — M9904 Segmental and somatic dysfunction of sacral region: Secondary | ICD-10-CM

## 2023-02-02 DIAGNOSIS — M9903 Segmental and somatic dysfunction of lumbar region: Secondary | ICD-10-CM

## 2023-02-02 DIAGNOSIS — G8929 Other chronic pain: Secondary | ICD-10-CM

## 2023-02-02 NOTE — Progress Notes (Signed)
 [name] [address] [place] [address] Phone: [phone] Subjective:   Curtis [name], am serving as a scribe for Dr. Arthea Claudene.  I'm seeing this patient by the request  of:  [name], NP  CC: Back and pain  [name]  Curtis [name] is a 38 y.o. male coming in with complaint of back and pain Patient states that he has been having pain in R side of lumbar spine from the glute to thoracic spine. Painful to extend knee. Back will randomly catch. Using Tylenol  prn.   Medications patient has been prescribed:   Taking:         Reviewed prior external information including notes and imaging from previsou exam, outside providers and external EMR if available.   As well as notes that were available from care everywhere and other healthcare systems.  Past medical history, social, surgical and family history all reviewed in electronic medical record.  No pertanent information unless stated regarding to the chief complaint.   Past Medical History:  Diagnosis Date   Acute cerebrovascular accident (CVA) of medulla oblongata (HCC) 09/09/2020   a.) small acute infarct of the lateral LEFT medulla in setting of LEFT vertebral artery dissection.   Anxiety    ASD (atrial septal defect) 09/14/2020   a.) TTE 09/14/2020: EF 65-70%; (+) IAS noted on color flow Doppler.   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 POSITIVE muscle aches  Objective  Blood pressure 122/88, pulse 78, height 6' (1.829 m), weight 269 lb (122 kg), SpO2 98%.   General: No apparent distress alert and oriented x3 mood and affect normal, dressed  appropriately.  HEENT: Pupils equal, extraocular movements intact  Respiratory: Patient's speak in full sentences and does not appear short of breath  Cardiovascular: No lower extremity edema, non tender, no erythema  Gait MSK:  Back back does have some loss of lordosis noted.  Some tenderness to palpation in the paraspinal musculature.  Osteopathic findings  T3 extended rotated and side bent right inhaled rib T9 extended rotated and side bent left L2 flexed rotated and side bent right L4 flexed rotated and side bent left Sacrum right on right       Assessment and Plan:  Thoracic back pain Chronic problem with exacerbation.  Seems to be more in the thoracic and lumbar areas.  Seem to be lower than usual.  Negative straight leg test noted today.  Discussed which activities to do and which ones to avoid.  Increase activity slowly otherwise.  Follow-up again in 6 to 8 weeks    Nonallopathic problems  Decision today to treat with OMT was based on Physical Exam  After verbal consent patient was treated with HVLA, ME, FPR techniques in  thoracic, lumbar, and sacral  areas  Patient tolerated the procedure well with improvement in symptoms  Patient given exercises, stretches and lifestyle modifications  See medications in patient instructions if given  Patient will follow up in 4-8 weeks     The above documentation has been reviewed and is accurate and complete Floree Zuniga M Verma Grothaus, DO         Note: This dictation was prepared with Dragon dictation along  with smaller phrase technology. Any transcriptional errors that result from this process are unintentional.

## 2023-02-02 NOTE — Assessment & Plan Note (Signed)
 Chronic problem with exacerbation.  Seems to be more in the thoracic and lumbar areas.  Seem to be lower than usual.  Negative straight leg test noted today.  Discussed which activities to do and which ones to avoid.  Increase activity slowly otherwise.  Follow-up again in 6 to 8 weeks

## 2023-02-11 DIAGNOSIS — F411 Generalized anxiety disorder: Secondary | ICD-10-CM | POA: Diagnosis not present

## 2023-02-12 NOTE — Progress Notes (Signed)
  [name] [address] [place] [address] Phone: [phone] Subjective:   [name], am serving as a scribe for Dr. Antoine Primas.  I'm seeing this patient by the request  of:  Larae Grooms, NP  CC: Low back pain follow-up  [id number]  Curtis [name] is a 38 y.o. male coming in with complaint of back OMT on 02/02/2023.  Patient was seen for an exacerbation of the back pain.  Patient states R side still catching. No other concerns.  Medications patient has been prescribed:   Taking:         Reviewed prior external information including notes and imaging from previsou exam, outside providers and external EMR if available.   As well as notes that were available from care everywhere and other healthcare systems.  Past medical history, social, surgical and family history all reviewed in electronic medical record.  No pertanent information unless stated regarding to the chief complaint.   Past Medical History:  Diagnosis Date   Acute cerebrovascular accident (CVA) of medulla oblongata (HCC) 09/09/2020   a.) small acute infarct of the lateral LEFT medulla in setting of LEFT vertebral artery dissection.   Anxiety    ASD (atrial septal defect) 09/14/2020   a.) TTE 09/14/2020: EF 65-70%; (+) IAS noted on color flow Doppler.   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 POSITIVE muscle aches  Objective  There were no vitals taken for this visit.   General: No apparent distress alert and oriented x3 mood and affect normal, dressed appropriately.  HEENT: Pupils equal, extraocular movements intact  Respiratory:  Patient's speak in full sentences and does not appear short of breath  Cardiovascular: No lower extremity edema, non tender, no erythema  Gait MSK:  Back does have some loss of lordosis.  Does still have significant tightness in the thoracolumbar juncture.  Osteopathic findings   T9 extended rotated and side bent left T11 extended rotated and side bent left L2 flexed rotated and side bent right Sacrum right on right       Assessment and Plan:  No problem-specific Assessment & Plan notes found for this encounter.    Nonallopathic problems  Decision today to treat with OMT was based on Physical Exam  After verbal consent patient was treated with HVLA, ME, FPR techniques in  thoracic, lumbar, and sacral  areas  Patient tolerated the procedure well with improvement in symptoms  Patient given exercises, stretches and lifestyle modifications  See medications in patient instructions if given  Patient will follow up in 6-8 weeks     The above documentation has been reviewed and is accurate and complete [name], DO         Note: This dictation was prepared with Dragon dictation along with smaller phrase technology. Any transcriptional errors that result from this process are unintentional.

## 2023-02-18 ENCOUNTER — Telehealth: Payer: Self-pay | Admitting: Nurse Practitioner

## 2023-02-18 ENCOUNTER — Ambulatory Visit (INDEPENDENT_AMBULATORY_CARE_PROVIDER_SITE_OTHER): Payer: BC Managed Care – PPO | Admitting: Family Medicine

## 2023-02-18 VITALS — BP 122/88 | HR 67 | Ht 72.0 in | Wt 257.0 lb

## 2023-02-18 DIAGNOSIS — M9903 Segmental and somatic dysfunction of lumbar region: Secondary | ICD-10-CM

## 2023-02-18 DIAGNOSIS — G8929 Other chronic pain: Secondary | ICD-10-CM

## 2023-02-18 DIAGNOSIS — M9904 Segmental and somatic dysfunction of sacral region: Secondary | ICD-10-CM | POA: Diagnosis not present

## 2023-02-18 DIAGNOSIS — M546 Pain in thoracic spine: Secondary | ICD-10-CM | POA: Diagnosis not present

## 2023-02-18 DIAGNOSIS — M9902 Segmental and somatic dysfunction of thoracic region: Secondary | ICD-10-CM

## 2023-02-18 NOTE — Telephone Encounter (Signed)
Copied from CRM [id number]. Topic: General - Inquiry >> Feb 18, 2023  2:58 PM Clide Dales wrote: Patient states that he received a bill from [facility] for his tests on 12/9 saying that insurance would not pay for them because they were not medically necessary. Patient wants to know what can be done. Please advise.

## 2023-02-18 NOTE — Patient Instructions (Signed)
 Good to see you Follow up in 6-8 weeks

## 2023-02-20 ENCOUNTER — Encounter: Payer: Self-pay | Admitting: Family Medicine

## 2023-02-20 NOTE — Assessment & Plan Note (Signed)
Improvement from exacerbation.  Attempted osteopathic manipulation again after evaluation.  Discussed icing regimen of home exercises, discussed which activities to do and which ones to avoid.  Follow-up again in 6 to 8 weeks.

## 2023-02-25 DIAGNOSIS — F411 Generalized anxiety disorder: Secondary | ICD-10-CM | POA: Diagnosis not present

## 2023-03-18 DIAGNOSIS — F411 Generalized anxiety disorder: Secondary | ICD-10-CM | POA: Diagnosis not present

## 2023-03-19 NOTE — Progress Notes (Signed)
 [name] [address] [place] [address] Phone: [phone] Subjective:   Bruce Donath, am serving as a scribe for Dr. Antoine Primas.  I'm seeing this patient by the request  of:  Larae Grooms, NP  CC: back and neck pain follow up   [id number]  Curtis [name] is a 38 y.o. male coming in with complaint of back and neck pain. OMT on 02/18/2023. Patient states that he still has some lower to mid back tightness.   Medications patient has been prescribed:   Taking:         Reviewed prior external information including notes and imaging from previsou exam, outside providers and external EMR if available.   As well as notes that were available from care everywhere and other healthcare systems.  Past medical history, social, surgical and family history all reviewed in electronic medical record.  No pertanent information unless stated regarding to the chief complaint.   Past Medical History:  Diagnosis Date   Acute cerebrovascular accident (CVA) of medulla oblongata (HCC) 09/09/2020   a.) small acute infarct of the lateral LEFT medulla in setting of LEFT vertebral artery dissection.   Anxiety    ASD (atrial septal defect) 09/14/2020   a.) TTE 09/14/2020: EF 65-70%; (+) IAS noted on color flow Doppler.   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 POSITIVE muscle aches  Objective  Blood pressure 108/72, pulse (!) 57, height 6' (1.829 m), SpO2 97%.   General: No apparent distress alert and oriented x3 mood and affect normal, dressed appropriately.  HEENT: Pupils equal, extraocular movements intact  Respiratory:  Patient's speak in full sentences and does not appear short of breath  Cardiovascular: No lower extremity edema, non tender, no erythema  Gait relatively normal MSK:  Back low back does have some loss lordosis noted.  Some tenderness to palpation in the paraspinal musculature.  Osteopathic findings   T3 extended rotated and side bent right inhaled rib T9 extended rotated and side bent left inhaled rib L2 flexed rotated and side bent right L5 flexed rotated and side bent left Sacrum right on right       Assessment and Plan:  Thoracic back pain Stable, lots of motion in the TL junction, still tightness in the scapular region continuing to work on some core strengthening.  Patient has done a remarkable job with weight loss.  Is down another 12 pounds in the last month.  Discussed avoiding certain activities.  Discussed which activities to do and which ones to avoid.  Follow-up again in 6 to 8 weeks otherwise.    Nonallopathic problems  Decision today to treat with OMT was based on Physical Exam  After verbal consent patient was treated with HVLA, ME, FPR techniques in  rib, thoracic, lumbar, and sacral  areas  Patient tolerated the procedure well with improvement in symptoms  Patient given exercises, stretches and lifestyle modifications  See medications in patient instructions if given  Patient will follow up in 4-8 weeks     The above documentation has been reviewed and is accurate and complete [name], DO         Note: This dictation was prepared  with Dragon dictation along with smaller phrase technology. Any transcriptional errors that result from this process are unintentional.

## 2023-03-22 ENCOUNTER — Encounter: Payer: Self-pay | Admitting: Family Medicine

## 2023-03-22 ENCOUNTER — Ambulatory Visit (INDEPENDENT_AMBULATORY_CARE_PROVIDER_SITE_OTHER): Payer: BC Managed Care – PPO | Admitting: Family Medicine

## 2023-03-22 VITALS — BP 108/72 | HR 57 | Ht 72.0 in

## 2023-03-22 DIAGNOSIS — M9904 Segmental and somatic dysfunction of sacral region: Secondary | ICD-10-CM

## 2023-03-22 DIAGNOSIS — G8929 Other chronic pain: Secondary | ICD-10-CM

## 2023-03-22 DIAGNOSIS — M9902 Segmental and somatic dysfunction of thoracic region: Secondary | ICD-10-CM | POA: Diagnosis not present

## 2023-03-22 DIAGNOSIS — M546 Pain in thoracic spine: Secondary | ICD-10-CM

## 2023-03-22 DIAGNOSIS — M9908 Segmental and somatic dysfunction of rib cage: Secondary | ICD-10-CM | POA: Diagnosis not present

## 2023-03-22 DIAGNOSIS — M9903 Segmental and somatic dysfunction of lumbar region: Secondary | ICD-10-CM

## 2023-03-22 NOTE — Assessment & Plan Note (Addendum)
 Stable, lots of motion in the TL junction, still tightness in the scapular region continuing to work on some core strengthening.  Patient has done a remarkable job with weight loss.  Is down another 12 pounds in the last month.  Discussed avoiding certain activities.  Discussed which activities to do and which ones to avoid.  Follow-up again in 6 to 8 weeks otherwise.

## 2023-03-23 ENCOUNTER — Encounter: Payer: Self-pay | Admitting: Pulmonary Disease

## 2023-03-23 ENCOUNTER — Ambulatory Visit: Payer: BC Managed Care – PPO | Admitting: Pulmonary Disease

## 2023-03-25 ENCOUNTER — Ambulatory Visit: Payer: BC Managed Care – PPO | Admitting: Family Medicine

## 2023-04-01 ENCOUNTER — Encounter: Payer: Self-pay | Admitting: Pulmonary Disease

## 2023-04-01 ENCOUNTER — Ambulatory Visit (INDEPENDENT_AMBULATORY_CARE_PROVIDER_SITE_OTHER): Admitting: Pulmonary Disease

## 2023-04-01 VITALS — BP 126/85 | HR 70 | Ht 72.0 in | Wt 260.8 lb

## 2023-04-01 DIAGNOSIS — G4733 Obstructive sleep apnea (adult) (pediatric): Secondary | ICD-10-CM

## 2023-04-01 DIAGNOSIS — E66812 Obesity, class 2: Secondary | ICD-10-CM

## 2023-04-01 NOTE — Patient Instructions (Signed)
 I will see you a year from now  Continue using your CPAP on a regular basis  Download from the machine shows it continues to work very well  Continue weight loss efforts  Call us with significant concerns

## 2023-04-01 NOTE — Progress Notes (Signed)
 Curtis [name]    [id number]    September 12, 1985  Primary Care Physician:Holdsworth, Clydie Braun, NP  Referring Physician: Larae Grooms, NP [address] [place],  [place] [address]  Chief complaint:   Patient being seen for obstructive sleep apnea  HPI:  Patient has severe obstructive sleep apnea Has been using CPAP regularly and tolerating CPAP well Daytime sleepiness is better Activity level is better  Overall tolerating treatment well  Denies any morning headaches No night sweats, no driving issues  He gets about 6 hours of sleep  Had a stroke about 2 years ago contributed to significant weight gain  Reformed smoker quit about last year-smoking cigars  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 Swallow whole.   atorvastatin (LIPITOR) 20 MG tablet Take 1 tablet (20 mg total) by mouth daily.   FLUoxetine (PROZAC) 40 MG capsule Take 1 capsule (40 mg total) by mouth daily.   fluticasone (FLONASE) 50 MCG/ACT nasal spray Place 2 sprays into both nostrils daily.   omeprazole (PRILOSEC) 20 MG capsule Take 1 capsule (20 mg total) by mouth daily.   ondansetron (ZOFRAN-ODT) 4 MG disintegrating tablet Take 1 tablet (4 mg total) by mouth every 8 (eight) hours as needed for nausea or vomiting.   SODIUM FLUORIDE 5000 PPM 1.1 % GEL dental gel Place 1 Application onto teeth at bedtime.   No facility-administered encounter medications on file as of 04/01/2023.    Allergies as of 04/01/2023   (No Known Allergies)    Past Medical History:  Diagnosis Date   Acute cerebrovascular accident (CVA) of medulla oblongata (HCC) 09/09/2020   a.) small acute infarct of the lateral LEFT medulla in setting of LEFT vertebral artery dissection.   Anxiety    ASD (atrial septal defect) 09/14/2020   a.) TTE 09/14/2020: EF 65-70%; (+) IAS noted on color flow  Doppler.   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    Past Surgical History:  Procedure Laterality Date   GALLBLADDER SURGERY  01/23/2021   removed    Family History  Problem Relation Age of Onset   COPD Maternal Grandmother    Arthritis Maternal Grandmother    Alcohol abuse Maternal Grandfather    Heart attack Maternal Grandfather     Social History   Socioeconomic History   Marital status: Married    Spouse name: Not on file   Number of children: Not on file   Years of education: Not on file   Highest education level: Not on file  Occupational History   Not on file  Tobacco Use   Smoking status: Former    Types: Cigars   Smokeless tobacco: Never  Vaping Use   Vaping status: Former  Substance and Sexual Activity   Alcohol use: Yes    Alcohol/week: 2.0 standard drinks of alcohol    Types: 2 Shots of liquor per week   Drug use: Never   Sexual activity: Yes  Other Topics Concern   Not on file  Social History Narrative   Not on file   Social Drivers of Health   Financial Resource Strain: Patient Declined (01/04/2023)   Overall Financial Resource Strain (CARDIA)    Difficulty of Paying Living Expenses: Patient declined  Food Insecurity: Patient Declined (01/04/2023)   Hunger Vital Sign  Worried About Programme researcher, broadcasting/film/video in the Last Year: Patient declined    Barista in the Last Year: Patient declined  Transportation Needs: Patient Declined (01/04/2023)   PRAPARE - Administrator, Civil Service (Medical): Patient declined    Lack of Transportation (Non-Medical): Patient declined  Physical Activity: Insufficiently Active (01/04/2023)   Exercise Vital Sign    Days of Exercise per Week: 2 days    Minutes of Exercise per Session: 20 min  Stress: No Stress Concern Present (01/04/2023)   [employer] of Occupational Health - Occupational Stress Questionnaire    Feeling of Stress : Only a little  Social Connections:  Unknown (01/04/2023)   Social Connection and Isolation Panel [NHANES]    Frequency of Communication with Friends and Family: Patient declined    Frequency of Social Gatherings with Friends and Family: Patient declined    Attends Religious Services: Patient declined    Database administrator or Organizations: Patient declined    Attends Engineer, structural: Not on file    Marital Status: Patient declined  Intimate Partner Violence: Not on file    Review of Systems  Constitutional:  Positive for fatigue.  Respiratory:  Positive for apnea.   Psychiatric/Behavioral:  Positive for sleep disturbance.     Vitals:   04/01/23 0942  BP: 126/85  Pulse: 70  SpO2: 97%     Physical Exam Constitutional:      Appearance: He is obese.  HENT:     Mouth/Throat:     Mouth: Mucous membranes are moist.     Comments: Mallampati 3, crowded oropharynx, neck size 18 Eyes:     General: No scleral icterus. Cardiovascular:     Rate and Rhythm: Normal rate and regular rhythm.     Heart sounds: No murmur heard.    No friction rub.  Pulmonary:     Effort: No respiratory distress.     Breath sounds: No stridor. No wheezing or rhonchi.  Musculoskeletal:     Cervical back: No rigidity or tenderness.  Neurological:     Mental Status: He is alert.  Psychiatric:        Mood and Affect: Mood normal.    Data Reviewed: Sleep study results from 07/21/2021 reviewed with the patient showing AHI of 70   Compliance shows 100% compliance Average use of 6 already off hours Settings of 5-15 Residual AHI 1.3   Assessment:  Severe obstructive sleep apnea adequately show CPAP therapy  Continues to tolerate CPAP well and symptoms continue to improve  Class II obesity -Continues to work on weight loss efforts  Plan/Recommendations: Continue CPAP therapy  Graded activities as tolerated  Encouraged to give [place] a call with any significant concerns  Follow-up a year from now    Virl Diamond MD [facility] 04/01/2023, 9:45 AM  CC: Larae Grooms, NP

## 2023-04-02 DIAGNOSIS — F411 Generalized anxiety disorder: Secondary | ICD-10-CM | POA: Diagnosis not present

## 2023-04-05 ENCOUNTER — Ambulatory Visit: Payer: BC Managed Care – PPO | Admitting: Family Medicine

## 2023-04-15 DIAGNOSIS — F411 Generalized anxiety disorder: Secondary | ICD-10-CM | POA: Diagnosis not present

## 2023-04-22 NOTE — Progress Notes (Deleted)
  [name] [address] [place] [address] Phone: [phone] Subjective:    I'm seeing this patient by the request  of:  Larae Grooms, NP  CC:   [id number]  Curtis [name] is a 38 y.o. male coming in with complaint of back and neck pain. OMT on 03/22/2023. Patient states   Medications patient has been prescribed:   Taking:         Reviewed prior external information including notes and imaging from previsou exam, outside providers and external EMR if available.   As well as notes that were available from care everywhere and other healthcare systems.  Past medical history, social, surgical and family history all reviewed in electronic medical record.  No pertanent information unless stated regarding to the chief complaint.   Past Medical History:  Diagnosis Date   Acute cerebrovascular accident (CVA) of medulla oblongata (HCC) 09/09/2020   a.) small acute infarct of the lateral LEFT medulla in setting of LEFT vertebral artery dissection.   Anxiety    ASD (atrial septal defect) 09/14/2020   a.) TTE 09/14/2020: EF 65-70%; (+) IAS noted on color flow Doppler.   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 POSITIVE muscle aches  Objective  There were no vitals taken for this visit.   General: No apparent distress alert and oriented x3 mood and affect normal, dressed appropriately.  HEENT: Pupils equal, extraocular movements intact  Respiratory: Patient's speak in full sentences and does not appear short of breath  Cardiovascular: No lower extremity edema, non tender, no erythema  Gait MSK:  Back   Osteopathic  findings  C2 flexed rotated and side bent right C6 flexed rotated and side bent left T3 extended rotated and side bent right inhaled rib T9 extended rotated and side bent left L2 flexed rotated and side bent right Sacrum right on right       Assessment and Plan:  No problem-specific Assessment & Plan notes found for this encounter.    Nonallopathic problems  Decision today to treat with OMT was based on Physical Exam  After verbal consent patient was treated with HVLA, ME, FPR techniques in cervical, rib, thoracic, lumbar, and sacral  areas  Patient tolerated the procedure well with improvement in symptoms  Patient given exercises, stretches and lifestyle modifications  See medications in patient instructions if given  Patient will follow up in 4-8 weeks             Note: This dictation was prepared with Dragon dictation along with smaller phrase technology. Any transcriptional errors that result from this process are unintentional.

## 2023-04-23 NOTE — Progress Notes (Signed)
 [name] [address] [place] [address] Phone: [phone] Subjective:   Bruce Donath, am serving as a scribe for Dr. Antoine Primas. I'm seeing this patient by the request  of:  Larae Grooms, NP  CC: Back pain  [id number]  Curtis [name] is a 38 y.o. male coming in with complaint of back pain. OMT on 03/22/2023. Patient states that he has been doing well. Having some numbness in later aspect of both feet. Notes tightness in lower back.  Has a newborn infant at home as well as with a toddler.  Is not getting much sleep and has not been able to take care of himself.  Has been noted is continuing to work on weight loss.  Patient is down another 6 pounds in the last month.  Medications patient has been prescribed:   Taking:         Reviewed prior external information including notes and imaging from previsou exam, outside providers and external EMR if available.   As well as notes that were available from care everywhere and other healthcare systems.  Past medical history, social, surgical and family history all reviewed in electronic medical record.  No pertanent information unless stated regarding to the chief complaint.   Past Medical History:  Diagnosis Date   Acute cerebrovascular accident (CVA) of medulla oblongata (HCC) 09/09/2020   a.) small acute infarct of the lateral LEFT medulla in setting of LEFT vertebral artery dissection.   Anxiety    ASD (atrial septal defect) 09/14/2020   a.) TTE 09/14/2020: EF 65-70%; (+) IAS noted on color flow Doppler.   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 POSITIVE  muscle aches  Objective  Blood pressure 110/84, pulse 84, height 6' (1.829 m), weight 254 lb (115.2 kg), SpO2 98%.   General: No apparent distress alert and oriented x3 mood and affect normal, dressed appropriately.  HEENT: Pupils equal, extraocular movements intact  Respiratory: Patient's speak in full sentences and does not appear short of breath  Cardiovascular: No lower extremity edema, non tender, no erythema  Does have some loss lordosis noted.  Some tightness noted in the paraspinal musculature.  Hypermobility of some other joints including the extremities.  Osteopathic findings  T3 extended rotated and side bent right inhaled rib T9 extended rotated and side bent left T11 extended rotated and side bent left L2 flexed rotated and side bent right L5 flexed rotated and side bent left Sacrum right on right       Assessment and Plan:  Thoracic back pain I believe some thoracic exacerbation have been secondary to caring for her newborn and fatigue as well as exhaustion.  We discussed with patient about different treatment options.  Given a muscle relaxer but warned of potential side effects.  Patient has done a very good job with weight loss that I think will be beneficial for him and his overall health.  Will hold on any other imaging of the back at the moment but if worsening symptoms may need to consider.  Patient is in agreement.  Follow-up with me again in 6 to 8 weeks   Zanaflex 2mg  given at night  Meloxicam 15 mg daily  given  Nonallopathic problems  Decision today to treat with OMT was based on Physical Exam  After verbal consent patient was treated with HVLA, ME, FPR techniques in  thoracic, lumbar, and sacral  areas  Patient tolerated the procedure well with improvement in symptoms  Patient given exercises, stretches and lifestyle modifications  See medications in patient instructions if given  Patient will follow up in 6-8 weeks    The above documentation  has been reviewed and is accurate and complete [name], DO          Note: This dictation was prepared with Dragon dictation along with smaller phrase technology. Any transcriptional errors that result from this process are unintentional.

## 2023-04-27 ENCOUNTER — Ambulatory Visit: Payer: BC Managed Care – PPO | Admitting: Family Medicine

## 2023-04-30 ENCOUNTER — Ambulatory Visit: Admitting: Family Medicine

## 2023-04-30 VITALS — BP 110/84 | HR 84 | Ht 72.0 in | Wt 254.0 lb

## 2023-04-30 DIAGNOSIS — M9903 Segmental and somatic dysfunction of lumbar region: Secondary | ICD-10-CM | POA: Diagnosis not present

## 2023-04-30 DIAGNOSIS — G8929 Other chronic pain: Secondary | ICD-10-CM

## 2023-04-30 DIAGNOSIS — M9904 Segmental and somatic dysfunction of sacral region: Secondary | ICD-10-CM

## 2023-04-30 DIAGNOSIS — M9902 Segmental and somatic dysfunction of thoracic region: Secondary | ICD-10-CM

## 2023-04-30 DIAGNOSIS — M546 Pain in thoracic spine: Secondary | ICD-10-CM | POA: Diagnosis not present

## 2023-04-30 MED ORDER — TIZANIDINE HCL 2 MG PO TABS: 2.0000 mg | ORAL_TABLET | Freq: Every day | ORAL | 0 refills | Status: DC

## 2023-04-30 NOTE — Assessment & Plan Note (Signed)
 I believe some thoracic exacerbation have been secondary to caring for her newborn and fatigue as well as exhaustion.  We discussed with patient about different treatment options.  Given a muscle relaxer but warned of potential side effects.  Patient has done a very good job with weight loss that I think will be beneficial for him and his overall health.  Will hold on any other imaging of the back at the moment but if worsening symptoms may need to consider.  Patient is in agreement.  Follow-up with me again in 6 to 8 weeks

## 2023-04-30 NOTE — Patient Instructions (Signed)
 Zanaflex as need See me when needed.

## 2023-05-11 ENCOUNTER — Ambulatory Visit: Admitting: Sports Medicine

## 2023-05-11 VITALS — BP 108/68 | HR 60 | Ht 72.0 in | Wt 254.0 lb

## 2023-05-11 DIAGNOSIS — M545 Low back pain, unspecified: Secondary | ICD-10-CM | POA: Diagnosis not present

## 2023-05-11 DIAGNOSIS — M9903 Segmental and somatic dysfunction of lumbar region: Secondary | ICD-10-CM | POA: Diagnosis not present

## 2023-05-11 DIAGNOSIS — M898X1 Other specified disorders of bone, shoulder: Secondary | ICD-10-CM

## 2023-05-11 DIAGNOSIS — G8929 Other chronic pain: Secondary | ICD-10-CM

## 2023-05-11 DIAGNOSIS — M546 Pain in thoracic spine: Secondary | ICD-10-CM | POA: Diagnosis not present

## 2023-05-11 DIAGNOSIS — M9902 Segmental and somatic dysfunction of thoracic region: Secondary | ICD-10-CM | POA: Diagnosis not present

## 2023-05-11 DIAGNOSIS — M9905 Segmental and somatic dysfunction of pelvic region: Secondary | ICD-10-CM

## 2023-05-11 NOTE — Patient Instructions (Addendum)
 Can be seen by Dr. Cleora Daft for flares that occur prior to appt with Dr. Felipe Horton Use Zanaflex prn Continue HEP for scapula at home

## 2023-05-11 NOTE — Progress Notes (Signed)
 [name] [name] [address] [place] [address] Phone: [phone]   Assessment and Plan:     1. Chronic thoracic back pain, unspecified back pain laterality 2. Chronic bilateral low back pain without sciatica 3. Chronic scapular pain 4. Somatic dysfunction of thoracic region 5. Somatic dysfunction of lumbar region 6. Somatic dysfunction of pelvic region  -Chronic with exacerbation, subsequent visit - Recurrence in multiple areas of musculoskeletal pain with most prominent being around right scapula likely exacerbated by patient caring for and holding newborn.  Additional pains in upper back and lower back - May continue tizanidine 2 mg daily as needed for muscle spasms - Patient has received relief with OMT in the past.  Elects for repeat OMT today.  Tolerated well per note below. - Decision today to treat with OMT was based on Physical Exam  After verbal consent patient was treated with HVLA (high velocity low amplitude), ME (muscle energy), FPR (flex positional release), ST (soft tissue), PC/PD (Pelvic Compression/ Pelvic Decompression) techniques in   thoracic, lumbar, and pelvic areas. Patient tolerated the procedure well with improvement in symptoms.  Patient educated on potential side effects of soreness and recommended to rest, hydrate, and use Tylenol as needed for pain control.   Pertinent previous records reviewed include none  Follow Up: Patient has follow-up scheduled to see Dr. Katrinka Blazing in 4 to 5 weeks.  May follow-up with me sooner as needed if pain flares   Subjective:   I, Curtis [name], am serving as a Neurosurgeon for Doctor Richardean Sale  Chief Complaint: back pain   HPI:   04/30/2023 Curtis [name] is a 38 y.o. male coming in with complaint of back pain. OMT on 03/22/2023. Patient states that he has been doing well. Having some numbness in later aspect of both feet. Notes tightness in lower back.  Has a newborn  infant at home as well as with a toddler.  Is not getting much sleep and has not been able to take care of himself.  Has been noted is continuing to work on weight loss.  Patient is down another 6 pounds in the last month.   05/11/23 Patient states that his pain began again yesterday below the R scapula. Had relief from manipulation but pain came back.    Relevant Historical Information: Hypertension, history of CVA possibly from cervical manipulation with chiropractor per patient  Additional pertinent review of systems negative.   Current Outpatient Medications:    aspirin EC 81 MG tablet, Take 81 mg by mouth daily. Swallow whole., Disp: , Rfl:    atorvastatin (LIPITOR) 20 MG tablet, Take 1 tablet (20 mg total) by mouth daily., Disp: 90 tablet, Rfl: 1   FLUoxetine (PROZAC) 40 MG capsule, Take 1 capsule (40 mg total) by mouth daily., Disp: 90 capsule, Rfl: 1   fluticasone (FLONASE) 50 MCG/ACT nasal spray, Place 2 sprays into both nostrils daily., Disp: , Rfl:    omeprazole (PRILOSEC) 20 MG capsule, Take 1 capsule (20 mg total) by mouth daily., Disp: 90 capsule, Rfl: 1   ondansetron (ZOFRAN-ODT) 4 MG disintegrating tablet, Take 1 tablet (4 mg total) by mouth every 8 (eight) hours as needed for nausea or vomiting., Disp: 20 tablet, Rfl: 5   SODIUM FLUORIDE 5000 PPM 1.1 % GEL dental gel, Place 1 Application onto teeth at bedtime., Disp: , Rfl:    tiZANidine (ZANAFLEX) 2 MG tablet, Take 1 tablet (2 mg total) by mouth at bedtime., Disp:  30 tablet, Rfl: 0   Objective:     Vitals:   05/11/23 1310  BP: 108/68  Pulse: 60  SpO2: 97%  Weight: 254 lb (115.2 kg)  Height: 6' (1.829 m)      Body mass index is 34.45 kg/m.    Physical Exam:    General: Well-appearing, cooperative, sitting comfortably in no acute distress.   OMT Physical Exam:  ASIS Compression Test: Positive Right   Thoracic: TTP paraspinal, T6 RR SR Lumbar: TTP paraspinal, L to R LSO Pelvis: Right anterior innominate     Electronically signed by:  Marshall Skeeter D.[facility] 3:21 PM 05/11/23

## 2023-05-13 ENCOUNTER — Ambulatory Visit: Admitting: Sports Medicine

## 2023-05-26 ENCOUNTER — Encounter: Payer: Self-pay | Admitting: Nurse Practitioner

## 2023-05-27 DIAGNOSIS — F411 Generalized anxiety disorder: Secondary | ICD-10-CM | POA: Diagnosis not present

## 2023-06-10 DIAGNOSIS — F411 Generalized anxiety disorder: Secondary | ICD-10-CM | POA: Diagnosis not present

## 2023-06-17 ENCOUNTER — Ambulatory Visit: Admitting: Family Medicine

## 2023-06-24 DIAGNOSIS — F411 Generalized anxiety disorder: Secondary | ICD-10-CM | POA: Diagnosis not present

## 2023-07-07 ENCOUNTER — Ambulatory Visit (INDEPENDENT_AMBULATORY_CARE_PROVIDER_SITE_OTHER): Payer: Self-pay | Admitting: Nurse Practitioner

## 2023-07-07 ENCOUNTER — Encounter: Payer: Self-pay | Admitting: Nurse Practitioner

## 2023-07-07 VITALS — BP 104/71 | HR 63 | Temp 98.7°F | Resp 15 | Ht 72.01 in | Wt 253.0 lb

## 2023-07-07 DIAGNOSIS — E7800 Pure hypercholesterolemia, unspecified: Secondary | ICD-10-CM

## 2023-07-07 DIAGNOSIS — F900 Attention-deficit hyperactivity disorder, predominantly inattentive type: Secondary | ICD-10-CM

## 2023-07-07 DIAGNOSIS — Z0000 Encounter for general adult medical examination without abnormal findings: Secondary | ICD-10-CM | POA: Diagnosis not present

## 2023-07-07 DIAGNOSIS — I10 Essential (primary) hypertension: Secondary | ICD-10-CM | POA: Diagnosis not present

## 2023-07-07 DIAGNOSIS — F988 Other specified behavioral and emotional disorders with onset usually occurring in childhood and adolescence: Secondary | ICD-10-CM | POA: Insufficient documentation

## 2023-07-07 MED ORDER — DEXMETHYLPHENIDATE HCL ER 10 MG PO CP24: 10.0000 mg | ORAL_CAPSULE | Freq: Every day | ORAL | 0 refills | Status: DC

## 2023-07-07 NOTE — Assessment & Plan Note (Signed)
 Chronic.  Controlled.  Continue with current medication regimen of Atorvastatin.  Labs ordered today.  Return to clinic in 6 months for reevaluation.  Call sooner if concerns arise.

## 2023-07-07 NOTE — Progress Notes (Signed)
 BP 104/71 (BP Location: Left Arm, Patient Position: Sitting, Cuff Size: Large)   Pulse 63   Temp 98.7 F (37.1 C) (Oral)   Resp 15   Ht 6' 0.01 (1.829 m)   Wt 253 lb (114.8 kg)   SpO2 98%   BMI 34.31 kg/m    Subjective:    Patient ID: Curtis [name], male    DOB: January 01, 1986, 38 y.o.   MRN: [id number]  HPI: Curtis [name] is a 38 y.o. male presenting on 07/07/2023 for comprehensive medical examination. Current medical complaints include:none  He currently lives with: Interim Problems from his last visit: no  Patient states he hasn't been taking his medication.  There is no particular reason but he plans to restart the medication.    Denies HA, CP, SOB, dizziness, palpitations, visual changes, and lower extremity swelling.  MOOD Patient states he isn't sure if he needs Prozac .  He doesn't really feel depressed but more difficulty with concentrating.  He feels like he gets more overwhelmed and anxious due to things piling up on him. He was treated previously for ADD and took ritalin but stopped before he graduated from high school.  ADHD ADHD status: uncontrolled Satisfied with current therapy: no Medication compliance:  excellent compliance Controlled substance contract: no Previous psychiatry evaluation: yes Previous medications: yes ritalin [place]   Taking meds on weekends/vacations: no Work/school performance:  good Difficulty sustaining attention/completing tasks: yes Distracted by extraneous stimuli: yes Does not listen when spoken to: no  Fidgets with hands or feet: yes Unable to stay in seat: no Blurts out/interrupts others: yes    Depression Screen done today and results listed below:     07/07/2023    8:49 AM 01/04/2023   10:03 AM 07/03/2022    8:08 AM 01/06/2022   10:07 AM 01/01/2022    8:22 AM  Depression screen PHQ 2/9  Decreased Interest 0 0 0 0 0  Down, Depressed, Hopeless 0 0 0 0 0  PHQ - 2 Score 0 0 0 0 0  Altered sleeping 0 0 1  0  Tired,  decreased energy 1 1 1  2   Change in appetite 0 0 0  2  Feeling bad or failure about yourself  0 0 0  0  Trouble concentrating 0 0 0  1  Moving slowly or fidgety/restless 0 0 0  0  Suicidal thoughts 0 0 0  0  PHQ-9 Score 1 1 2  5   Difficult doing work/chores   Not difficult at all  Not difficult at all    The patient does not have a history of falls. I did complete a risk assessment for falls. A plan of care for falls was documented.   Past Medical History:  Past Medical History:  Diagnosis Date   Acute cerebrovascular accident (CVA) of medulla oblongata (HCC) 09/09/2020   a.) small acute infarct of the lateral LEFT medulla in setting of LEFT vertebral artery dissection.   Anxiety    ASD (atrial septal defect) 09/14/2020   a.) TTE 09/14/2020: EF 65-70%; (+) IAS noted on color flow Doppler.   GERD (gastroesophageal reflux disease)    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    Surgical History:  Past Surgical History:  Procedure Laterality Date   GALLBLADDER SURGERY  01/23/2021   removed    Medications:  Current Outpatient Medications on File Prior to Visit  Medication Sig   aspirin  EC 81 MG tablet Take 81  mg by mouth daily. Swallow whole.   atorvastatin  (LIPITOR) 20 MG tablet Take 1 tablet (20 mg total) by mouth daily.   FLUoxetine  (PROZAC ) 40 MG capsule Take 1 capsule (40 mg total) by mouth daily.   fluticasone (FLONASE) 50 MCG/ACT nasal spray Place 2 sprays into both nostrils daily.   omeprazole  (PRILOSEC) 20 MG capsule Take 1 capsule (20 mg total) by mouth daily.   ondansetron  (ZOFRAN -ODT) 4 MG disintegrating tablet Take 1 tablet (4 mg total) by mouth every 8 (eight) hours as needed for nausea or vomiting.   SODIUM FLUORIDE 5000 PPM 1.1 % GEL dental gel Place 1 Application onto teeth at bedtime.   tiZANidine  (ZANAFLEX ) 2 MG tablet Take 1 tablet (2 mg total) by mouth at bedtime.   No current facility-administered medications on file prior to  visit.    Allergies:  No Known Allergies  Social History:  Social History   Socioeconomic History   Marital status: Married    Spouse name: Not on file   Number of children: Not on file   Years of education: Not on file   Highest education level: Bachelor's degree (e.g., BA, AB, BS)  Occupational History   Not on file  Tobacco Use   Smoking status: Former    Types: Cigars   Smokeless tobacco: Never  Vaping Use   Vaping status: Former  Substance and Sexual Activity   Alcohol use: Yes    Alcohol/week: 2.0 standard drinks of alcohol    Types: 2 Shots of liquor per week   Drug use: Not Currently   Sexual activity: Yes    Birth control/protection: None  Other Topics Concern   Not on file  Social History Narrative   Not on file   Social Drivers of Health   Financial Resource Strain: Low Risk  (07/06/2023)   Overall Financial Resource Strain (CARDIA)    Difficulty of Paying Living Expenses: Not hard at all  Food Insecurity: No Food Insecurity (07/06/2023)   Hunger Vital Sign    Worried About Running Out of Food in the Last Year: Never true    Ran Out of Food in the Last Year: Never true  Transportation Needs: No Transportation Needs (07/06/2023)   PRAPARE - Administrator, Civil Service (Medical): No    Lack of Transportation (Non-Medical): No  Physical Activity: Insufficiently Active (07/06/2023)   Exercise Vital Sign    Days of Exercise per Week: 3 days    Minutes of Exercise per Session: 10 min  Stress: No Stress Concern Present (07/06/2023)   [employer] of Occupational Health - Occupational Stress Questionnaire    Feeling of Stress : Not at all  Social Connections: Socially Integrated (07/06/2023)   Social Connection and Isolation Panel [NHANES]    Frequency of Communication with Friends and Family: More than three times a week    Frequency of Social Gatherings with Friends and Family: Twice a week    Attends Religious Services: More than 4 times  per year    Active Member of [employer] or Organizations: Yes    Attends Banker Meetings: More than 4 times per year    Marital Status: Married  Catering manager Violence: Not At Risk (07/07/2023)   Humiliation, Afraid, Rape, and Kick questionnaire    Fear of Current or Ex-Partner: No    Emotionally Abused: No    Physically Abused: No    Sexually Abused: No   Social History   Tobacco Use  Smoking Status  Former   Types: Cigars  Smokeless Tobacco Never   Social History   Substance and Sexual Activity  Alcohol Use Yes   Alcohol/week: 2.0 standard drinks of alcohol   Types: 2 Shots of liquor per week    Family History:  Family History  Problem Relation Age of Onset   COPD Maternal Grandmother    Arthritis Maternal Grandmother    Alcohol abuse Maternal Grandfather    Heart attack Maternal Grandfather     Past medical history, surgical history, medications, allergies, family history and social history reviewed with patient today and changes made to appropriate areas of the chart.   Review of Systems  Eyes:  Negative for blurred vision and double vision.  Respiratory:  Negative for shortness of breath.   Cardiovascular:  Negative for chest pain, palpitations and leg swelling.  Neurological:  Negative for dizziness, tingling and headaches.  Psychiatric/Behavioral:  Negative for depression. The patient is not nervous/anxious.        Difficulty concentrating   All other ROS negative except what is listed above and in the HPI.      Objective:    BP 104/71 (BP Location: Left Arm, Patient Position: Sitting, Cuff Size: Large)   Pulse 63   Temp 98.7 F (37.1 C) (Oral)   Resp 15   Ht 6' 0.01 (1.829 m)   Wt 253 lb (114.8 kg)   SpO2 98%   BMI 34.31 kg/m   Wt Readings from Last 3 Encounters:  07/07/23 253 lb (114.8 kg)  05/11/23 254 lb (115.2 kg)  04/30/23 254 lb (115.2 kg)    Physical Exam Vitals and nursing note reviewed.  Constitutional:      General: He  is not in acute distress.    Appearance: Normal appearance. He is obese. He is not ill-appearing, toxic-appearing or diaphoretic.  HENT:     Head: Normocephalic.     Right Ear: Tympanic membrane, ear canal and external ear normal.     Left Ear: Tympanic membrane, ear canal and external ear normal.     Nose: Nose normal. No congestion or rhinorrhea.     Mouth/Throat:     Mouth: Mucous membranes are moist.  Eyes:     General:        Right eye: No discharge.        Left eye: No discharge.     Extraocular Movements: Extraocular movements intact.     Conjunctiva/sclera: Conjunctivae normal.     Pupils: Pupils are equal, round, and reactive to light.  Cardiovascular:     Rate and Rhythm: Normal rate and regular rhythm.     Heart sounds: No murmur heard. Pulmonary:     Effort: Pulmonary effort is normal. No respiratory distress.     Breath sounds: Normal breath sounds. No wheezing, rhonchi or rales.  Abdominal:     General: Abdomen is flat. Bowel sounds are normal. There is no distension.     Palpations: Abdomen is soft.     Tenderness: There is no abdominal tenderness. There is no guarding.  Musculoskeletal:     Cervical back: Normal range of motion and neck supple.  Skin:    General: Skin is warm and dry.     Capillary Refill: Capillary refill takes less than 2 seconds.  Neurological:     General: No focal deficit present.     Mental Status: He is alert and oriented to person, place, and time.     Cranial Nerves: No cranial nerve deficit.  Motor: No weakness.     Deep Tendon Reflexes: Reflexes normal.  Psychiatric:        Mood and Affect: Mood normal.        Behavior: Behavior normal.        Thought Content: Thought content normal.        Judgment: Judgment normal.     Results for orders placed or performed in visit on 01/04/23  Comp Met (CMET)   Collection Time: 01/04/23 10:25 AM  Result Value Ref Range   Glucose 79 70 - 99 mg/dL   BUN 9 6 - 20 mg/dL   Creatinine,  Ser 1.61 0.76 - 1.27 mg/dL   eGFR 096 >04 VW/UJW/1.19   BUN/Creatinine Ratio 11 9 - 20   Sodium 138 134 - 144 mmol/L   Potassium 4.6 3.5 - 5.2 mmol/L   Chloride 103 96 - 106 mmol/L   CO2 23 20 - 29 mmol/L   Calcium  9.5 8.7 - 10.2 mg/dL   Total Protein 6.9 6.0 - 8.5 g/dL   Albumin 4.5 4.1 - 5.1 g/dL   Globulin, Total 2.4 1.5 - 4.5 g/dL   Bilirubin Total 0.5 0.0 - 1.2 mg/dL   Alkaline Phosphatase 102 44 - 121 IU/L   AST 20 0 - 40 IU/L   ALT 13 0 - 44 IU/L  Lipid Profile   Collection Time: 01/04/23 10:25 AM  Result Value Ref Range   Cholesterol, Total 173 100 - 199 mg/dL   Triglycerides 82 0 - 149 mg/dL   HDL 52 >14 mg/dL   VLDL Cholesterol Cal 15 5 - 40 mg/dL   LDL Chol Calc (NIH) 782 (H) 0 - 99 mg/dL   Chol/HDL Ratio 3.3 0.0 - 5.0 ratio  Testosterone , free, total([facility])   Collection Time: 01/04/23 10:25 AM  Result Value Ref Range   Testosterone  398 264 - 916 ng/dL   Testosterone , Free 7.6 (L) 8.7 - 25.1 pg/mL   Sex Hormone Binding 37.5 16.5 - 55.9 nmol/L  Anemia Profile B   Collection Time: 01/04/23 10:25 AM  Result Value Ref Range   Total Iron Binding Capacity 257 250 - 450 ug/dL   UIBC 956 213 - 086 ug/dL   Iron 98 38 - 578 ug/dL   Iron Saturation 38 15 - 55 %   Ferritin 109 30 - 400 ng/mL   Vitamin B-12 360 232 - 1,245 pg/mL   Folate 2.2 (L) >3.0 ng/mL   WBC 9.2 3.4 - 10.8 x10E3/uL   RBC 5.64 4.14 - 5.80 x10E6/uL   Hemoglobin 16.5 13.0 - 17.7 g/dL   Hematocrit 46.9 62.9 - 51.0 %   MCV 88 79 - 97 fL   MCH 29.3 26.6 - 33.0 pg   MCHC 33.3 31.5 - 35.7 g/dL   RDW 52.8 41.3 - 24.4 %   Platelets 294 150 - 450 x10E3/uL   Neutrophils 55 Not Estab. %   Lymphs 32 Not Estab. %   Monocytes 9 Not Estab. %   Eos 3 Not Estab. %   Basos 1 Not Estab. %   Neutrophils Absolute 5.1 1.4 - 7.0 x10E3/uL   Lymphocytes Absolute 3.0 0.7 - 3.1 x10E3/uL   Monocytes Absolute 0.8 0.1 - 0.9 x10E3/uL   EOS (ABSOLUTE) 0.3 0.0 - 0.4 x10E3/uL   Basophils Absolute 0.1 0.0 - 0.2  x10E3/uL   Immature Granulocytes 0 Not Estab. %   Immature Grans (Abs) 0.0 0.0 - 0.1 x10E3/uL   Retic Ct Pct 1.0 0.6 - 2.6 %  Assessment & Plan:   Problem List Items Addressed This Visit       Cardiovascular and Mediastinum   Essential hypertension   Chronic.  Controlled without medication.  Labs ordered today.  Return to clinic in 6 months for reevaluation.  Call sooner if concerns arise.       Relevant Orders   Comprehensive metabolic panel with GFR     Other   Morbid obesity (HCC)   Recommended eating smaller high protein, low fat meals more frequently and exercising 30 mins a day 5 times a week with a goal of 10-15lb weight loss in the next 3 months.       Relevant Medications   dexmethylphenidate (FOCALIN XR) 10 MG 24 hr capsule   Elevated LDL cholesterol level   Chronic.  Controlled.  Continue with current medication regimen of Atorvastatin .  Labs ordered today.  Return to clinic in 6 months for reevaluation.  Call sooner if concerns arise.        Relevant Orders   Lipid panel   ADD (attention deficit disorder)   Chronic condition.  Diagnosed as a child.  Was previously on Ritalin- stopped during high school. Will start Focalin 10mg  daily.  Side effects and benefits of medication discussed.  Reviewed medication holidays.  Follow up in 1 month for reevaluation.  Call sooner if concerns arise.      Other Visit Diagnoses       Annual physical exam    -  Primary   Health maintenance reviewed during visit today.  Labs ordered.  Vaccines reviewed.   Relevant Orders   TSH   Lipid panel   CBC with Differential/Platelet   Comprehensive metabolic panel with GFR        Discussed aspirin  prophylaxis for myocardial infarction prevention and decision was it was not indicated  LABORATORY TESTING:  Health maintenance labs ordered today as discussed above.    IMMUNIZATIONS:   - Tdap: Tetanus vaccination status reviewed: last tetanus booster within 10 years. -  Influenza: up to date - Pneumovax: Not applicable - Prevnar: Not applicable - COVID: Refused - HPV: Monogamous relationship - Shingrix vaccine: Not applicable  SCREENING: - Colonoscopy: Not applicable  Discussed with patient purpose of the colonoscopy is to detect colon cancer at curable precancerous or early stages   - AAA Screening: Not applicable  -Hearing Test: Not applicable  -Spirometry: Not applicable   PATIENT COUNSELING:    Sexuality: Discussed sexually transmitted diseases, partner selection, use of condoms, avoidance of unintended pregnancy  and contraceptive alternatives.   Advised to avoid cigarette smoking.  I discussed with the patient that most people either abstain from alcohol or drink within safe limits (<=14/week and <=4 drinks/occasion for males, <=7/weeks and <= 3 drinks/occasion for females) and that the risk for alcohol disorders and other health effects rises proportionally with the number of drinks per week and how often a drinker exceeds daily limits.  Discussed cessation/primary prevention of drug use and availability of treatment for abuse.   Diet: Encouraged to adjust caloric intake to maintain  or achieve ideal body weight, to reduce intake of dietary saturated fat and total fat, to limit sodium intake by avoiding high sodium foods and not adding table salt, and to maintain adequate dietary potassium and calcium  preferably from fresh fruits, vegetables, and low-fat dairy products.    stressed the importance of regular exercise  Injury prevention: Discussed safety belts, safety helmets, smoke detector, smoking near bedding or upholstery.   Dental health:  Discussed importance of regular tooth brushing, flossing, and dental visits.   Follow up plan: NEXT PREVENTATIVE PHYSICAL DUE IN 1 YEAR. Return in about 1 month (around 08/06/2023) for ADHD FU (virtual).

## 2023-07-07 NOTE — Assessment & Plan Note (Signed)
 Recommended eating smaller high protein, low fat meals more frequently and exercising 30 mins a day 5 times a week with a goal of 10-15lb weight loss in the next 3 months.

## 2023-07-07 NOTE — Assessment & Plan Note (Signed)
 Chronic condition.  Diagnosed as a child.  Was previously on Ritalin- stopped during high school. Will start Focalin 10mg  daily.  Side effects and benefits of medication discussed.  Reviewed medication holidays.  Follow up in 1 month for reevaluation.  Call sooner if concerns arise.

## 2023-07-07 NOTE — Assessment & Plan Note (Signed)
 Chronic.  Controlled without medication..  Labs ordered today.  Return to clinic in 6 months for reevaluation.  Call sooner if concerns arise.

## 2023-07-08 ENCOUNTER — Ambulatory Visit: Payer: Self-pay | Admitting: Nurse Practitioner

## 2023-07-08 DIAGNOSIS — F411 Generalized anxiety disorder: Secondary | ICD-10-CM | POA: Diagnosis not present

## 2023-07-08 LAB — COMPREHENSIVE METABOLIC PANEL WITH GFR
ALT: 15 IU/L (ref 0–44)
AST: 20 IU/L (ref 0–40)
Albumin: 4.4 g/dL (ref 4.1–5.1)
Alkaline Phosphatase: 104 IU/L (ref 44–121)
BUN/Creatinine Ratio: 15 (ref 9–20)
BUN: 14 mg/dL (ref 6–20)
Bilirubin Total: 0.3 mg/dL (ref 0.0–1.2)
CO2: 21 mmol/L (ref 20–29)
Calcium: 9.3 mg/dL (ref 8.7–10.2)
Chloride: 104 mmol/L (ref 96–106)
Creatinine, Ser: 0.94 mg/dL (ref 0.76–1.27)
Globulin, Total: 2.2 g/dL (ref 1.5–4.5)
Glucose: 79 mg/dL (ref 70–99)
Potassium: 4.8 mmol/L (ref 3.5–5.2)
Sodium: 139 mmol/L (ref 134–144)
Total Protein: 6.6 g/dL (ref 6.0–8.5)
eGFR: 108 mL/min/{1.73_m2} (ref >59)

## 2023-07-08 LAB — TSH: TSH: 0.711 u[IU]/mL (ref 0.450–4.500)

## 2023-07-08 LAB — CBC WITH DIFFERENTIAL/PLATELET
Basophils Absolute: 0 10*3/uL (ref 0.0–0.2)
Basos: 0 %
EOS (ABSOLUTE): 0.4 10*3/uL (ref 0.0–0.4)
Eos: 4 %
Hematocrit: 50.9 % (ref 37.5–51.0)
Hemoglobin: 16.8 g/dL (ref 13.0–17.7)
Immature Grans (Abs): 0 10*3/uL (ref 0.0–0.1)
Immature Granulocytes: 0 %
Lymphocytes Absolute: 2.6 10*3/uL (ref 0.7–3.1)
Lymphs: 27 %
MCH: 29.9 pg (ref 26.6–33.0)
MCHC: 33 g/dL (ref 31.5–35.7)
MCV: 91 fL (ref 79–97)
Monocytes Absolute: 0.8 10*3/uL (ref 0.1–0.9)
Monocytes: 8 %
Neutrophils Absolute: 5.8 10*3/uL (ref 1.4–7.0)
Neutrophils: 61 %
Platelets: 290 10*3/uL (ref 150–450)
RBC: 5.62 x10E6/uL (ref 4.14–5.80)
RDW: 12.7 % (ref 11.6–15.4)
WBC: 9.6 10*3/uL (ref 3.4–10.8)

## 2023-07-08 LAB — LIPID PANEL
Chol/HDL Ratio: 3.3 ratio (ref 0.0–5.0)
Cholesterol, Total: 164 mg/dL (ref 100–199)
HDL: 50 mg/dL (ref >39)
LDL Chol Calc (NIH): 101 mg/dL — ABNORMAL HIGH (ref 0–99)
Triglycerides: 67 mg/dL (ref 0–149)
VLDL Cholesterol Cal: 13 mg/dL (ref 5–40)

## 2023-07-19 NOTE — Progress Notes (Unsigned)
  [name] [address] [place] [address] Phone: [phone] Subjective:    I'm seeing this patient by the request  of:  [name], NP  CC: back and neck pain follow up   YEP:Curtis [name]  Curtis [name] is a 38 y.o. male coming in with complaint of back and neck pain. OMT 04/30/2023. Patient states he is good. Thoracic is tight   Medications patient has been prescribed: Zanaflex   Taking: yes          Reviewed prior external information including notes and imaging from previsou exam, outside providers and external EMR if available.   As well as notes that were available from care everywhere and other healthcare systems.  Past medical history, social, surgical and family history all reviewed in electronic medical record.  No pertanent information unless stated regarding to the chief complaint.   Past Medical History:  Diagnosis Date   Acute cerebrovascular accident (CVA) of medulla oblongata (HCC) 09/09/2020   a.) small acute infarct of the lateral LEFT medulla in setting of LEFT vertebral artery dissection.   Anxiety    ASD (atrial septal defect) 09/14/2020   a.) TTE 09/14/2020: EF 65-70%; (+) IAS noted on color flow Doppler.   GERD (gastroesophageal reflux disease)    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    No Known Allergies   Review of Systems:  No headache, visual changes, nausea, vomiting, diarrhea, constipation, dizziness, abdominal pain, skin rash, fevers, chills, night sweats, weight loss, swollen lymph nodes, body aches, joint swelling, chest pain, shortness of breath, mood changes. POSITIVE muscle aches  Objective  Blood pressure 120/82, pulse 77, height 6' (1.829 m), weight 253 lb (114.8 kg), SpO2 99%.   General: No apparent distress alert and oriented x3 mood and affect normal, dressed appropriately.  HEENT: Pupils equal, extraocular movements intact  Respiratory: Patient's speak  in full sentences and does not appear short of breath  Cardiovascular: No lower extremity edema, non tender, no erythema  Gait MSK:  Back loss of lordosis hypermobility noted   Osteopathic findings  T3 extended rotated and side bent right inhaled rib T9 extended rotated and side bent left L2 flexed rotated and side bent right Sacrum right on right       Assessment and Plan:  Thoracic back pain Low back exam show tightness discussed with which activities to avoid.  Increase activity slowly.  Tightness with Deri right greater than left. follow-up again in 2 months Likely from exacerbation from picking up newborn who has had a lot of gastroesophageal reflux disease.    Nonallopathic problems  Decision today to treat with OMT was based on Physical Exam  After verbal consent patient was treated with HVLA, ME, FPR techniques in thoracic, lumbar, and sacral  areas  Patient tolerated the procedure well with improvement in symptoms  Patient given exercises, stretches and lifestyle modifications  See medications in patient instructions if given  Patient will follow up in 4-8 weeks    The above documentation has been reviewed and is accurate and complete Curtis [name] M Curtis Mckellips, DO          Note: This dictation was prepared with Dragon dictation along with smaller phrase technology. Any transcriptional errors that result from this process are unintentional.

## 2023-07-20 DIAGNOSIS — F411 Generalized anxiety disorder: Secondary | ICD-10-CM | POA: Diagnosis not present

## 2023-07-22 ENCOUNTER — Ambulatory Visit: Admitting: Family Medicine

## 2023-07-22 ENCOUNTER — Encounter: Payer: Self-pay | Admitting: Family Medicine

## 2023-07-22 VITALS — BP 120/82 | HR 77 | Ht 72.0 in | Wt 253.0 lb

## 2023-07-22 DIAGNOSIS — M9902 Segmental and somatic dysfunction of thoracic region: Secondary | ICD-10-CM | POA: Diagnosis not present

## 2023-07-22 DIAGNOSIS — M9903 Segmental and somatic dysfunction of lumbar region: Secondary | ICD-10-CM | POA: Diagnosis not present

## 2023-07-22 DIAGNOSIS — M9904 Segmental and somatic dysfunction of sacral region: Secondary | ICD-10-CM

## 2023-07-22 DIAGNOSIS — M546 Pain in thoracic spine: Secondary | ICD-10-CM

## 2023-07-22 DIAGNOSIS — G8929 Other chronic pain: Secondary | ICD-10-CM

## 2023-07-22 NOTE — Assessment & Plan Note (Addendum)
 Low back exam show tightness discussed with which activities to avoid.  Increase activity slowly.  Tightness with Deri right greater than left. follow-up again in 2 months Likely from exacerbation from picking up newborn who has had a lot of gastroesophageal reflux disease.

## 2023-07-29 DIAGNOSIS — F411 Generalized anxiety disorder: Secondary | ICD-10-CM | POA: Diagnosis not present

## 2023-08-09 ENCOUNTER — Telehealth: Admitting: Nurse Practitioner

## 2023-08-09 ENCOUNTER — Ambulatory Visit: Admitting: Nurse Practitioner

## 2023-08-09 DIAGNOSIS — F900 Attention-deficit hyperactivity disorder, predominantly inattentive type: Secondary | ICD-10-CM

## 2023-08-09 MED ORDER — DEXMETHYLPHENIDATE HCL ER 20 MG PO CP24: 20.0000 mg | ORAL_CAPSULE | Freq: Every day | ORAL | 0 refills | Status: DC

## 2023-08-09 NOTE — Progress Notes (Signed)
 There were no vitals taken for this visit.   Subjective:    Patient ID: Curtis [name], male    DOB: July 06, 1985, 38 y.o.   MRN: [id number]  HPI: Curtis [name] is a 38 y.o. male  Chief Complaint  Patient presents with   ADHD   ADHD Last visit patient was started on the Focalin .  He feels like it is working for him but could be increased.  He is taking the medication daily.  ADHD status: controlled Satisfied with current therapy: no Medication compliance:  excellent compliance Controlled substance contract: no Previous psychiatry evaluation: yes Previous medications: yes ritalin LA   Taking meds on weekends/vacations: no Work/school performance:  good Difficulty sustaining attention/completing tasks: yes Distracted by extraneous stimuli: yes Does not listen when spoken to: no  Fidgets with hands or feet: yes Unable to stay in seat: no Blurts out/interrupts others: yes  He stopped the Fluoxetine  due to his anxiety being better controlled on the focalin .    Relevant past medical, surgical, family and social history reviewed and updated as indicated. Interim medical history since our last visit reviewed. Allergies and medications reviewed and updated.  Review of Systems  Psychiatric/Behavioral:  Positive for decreased concentration.     Per HPI unless specifically indicated above     Objective:    There were no vitals taken for this visit.  Wt Readings from Last 3 Encounters:  07/22/23 253 lb (114.8 kg)  07/07/23 253 lb (114.8 kg)  05/11/23 254 lb (115.2 kg)    Physical Exam Vitals and nursing note reviewed.  Constitutional:      General: He is not in acute distress.    Appearance: He is not ill-appearing.  HENT:     Head: Normocephalic.     Right Ear: Hearing normal.     Left Ear: Hearing normal.     Nose: Nose normal.  Pulmonary:     Effort: Pulmonary effort is normal. No respiratory distress.  Neurological:     Mental Status: He is alert.   Psychiatric:        Mood and Affect: Mood normal.        Behavior: Behavior normal.        Thought Content: Thought content normal.        Judgment: Judgment normal.     Results for orders placed or performed in visit on 07/07/23  TSH   Collection Time: 07/07/23  9:13 AM  Result Value Ref Range   TSH 0.711 0.450 - 4.500 uIU/mL  Lipid panel   Collection Time: 07/07/23  9:13 AM  Result Value Ref Range   Cholesterol, Total 164 100 - 199 mg/dL   Triglycerides 67 0 - 149 mg/dL   HDL 50 >60 mg/dL   VLDL Cholesterol Cal 13 5 - 40 mg/dL   LDL Chol Calc (NIH) 898 (H) 0 - 99 mg/dL   Chol/HDL Ratio 3.3 0.0 - 5.0 ratio  CBC with Differential/Platelet   Collection Time: 07/07/23  9:13 AM  Result Value Ref Range   WBC 9.6 3.4 - 10.8 x10E3/uL   RBC 5.62 4.14 - 5.80 x10E6/uL   Hemoglobin 16.8 13.0 - 17.7 g/dL   Hematocrit 49.0 62.4 - 51.0 %   MCV 91 79 - 97 fL   MCH 29.9 26.6 - 33.0 pg   MCHC 33.0 31.5 - 35.7 g/dL   RDW 87.2 88.3 - 84.5 %   Platelets 290 150 - 450 x10E3/uL   Neutrophils 61 Not Estab. %  Lymphs 27 Not Estab. %   Monocytes 8 Not Estab. %   Eos 4 Not Estab. %   Basos 0 Not Estab. %   Neutrophils Absolute 5.8 1.4 - 7.0 x10E3/uL   Lymphocytes Absolute 2.6 0.7 - 3.1 x10E3/uL   Monocytes Absolute 0.8 0.1 - 0.9 x10E3/uL   EOS (ABSOLUTE) 0.4 0.0 - 0.4 x10E3/uL   Basophils Absolute 0.0 0.0 - 0.2 x10E3/uL   Immature Granulocytes 0 Not Estab. %   Immature Grans (Abs) 0.0 0.0 - 0.1 x10E3/uL  Comprehensive metabolic panel with GFR   Collection Time: 07/07/23  9:13 AM  Result Value Ref Range   Glucose 79 70 - 99 mg/dL   BUN 14 6 - 20 mg/dL   Creatinine, Ser 9.05 0.76 - 1.27 mg/dL   eGFR 891 >40 fO/fpw/8.26   BUN/Creatinine Ratio 15 9 - 20   Sodium 139 134 - 144 mmol/L   Potassium 4.8 3.5 - 5.2 mmol/L   Chloride 104 96 - 106 mmol/L   CO2 21 20 - 29 mmol/L   Calcium  9.3 8.7 - 10.2 mg/dL   Total Protein 6.6 6.0 - 8.5 g/dL   Albumin 4.4 4.1 - 5.1 g/dL   Globulin, Total  2.2 1.5 - 4.5 g/dL   Bilirubin Total 0.3 0.0 - 1.2 mg/dL   Alkaline Phosphatase 104 44 - 121 IU/L   AST 20 0 - 40 IU/L   ALT 15 0 - 44 IU/L      Assessment & Plan:   Problem List Items Addressed This Visit       Other   ADD (attention deficit disorder) - Primary   Chronic.  Improved with Focalin .  Will increase dose to 20mg  daily.  Prescription sent in for patient today.  Patient will reach out about dose in a couple of weeks to determine if this is a good dose for him or not.  Will follow up with patient in 3 months.          Follow up plan: Return in about 3 months (around 11/09/2023) for ADHD FU (virtual).   This visit was completed via MyChart due to the restrictions of the COVID-19 pandemic. All issues as above were discussed and addressed. Physical exam was done as above through visual confirmation on MyChart. If it was felt that the patient should be evaluated in the office, they were directed there. The patient verbally consented to this visit. Location of the patient: Home Location of the provider: Office Those involved with this call:  Provider: Darice Petty, NP CMA: Cena Maffucci, CMA Front Desk/Registration: Claretta Maiden This encounter was conducted via video.  I spent 30 minutes dedicated to the care of this patient on the date of this encounter to include previsit review of plan of care, medication, and follow up, face to face time with the patient, and post visit ordering of testing.

## 2023-08-09 NOTE — Assessment & Plan Note (Signed)
 Chronic.  Improved with Focalin .  Will increase dose to 20mg  daily.  Prescription sent in for patient today.  Patient will reach out about dose in a couple of weeks to determine if this is a good dose for him or not.  Will follow up with patient in 3 months.

## 2023-08-10 NOTE — Progress Notes (Signed)
 Appt scheduled

## 2023-08-16 ENCOUNTER — Encounter: Payer: Self-pay | Admitting: Nurse Practitioner

## 2023-08-19 DIAGNOSIS — F411 Generalized anxiety disorder: Secondary | ICD-10-CM | POA: Diagnosis not present

## 2023-08-31 DIAGNOSIS — F411 Generalized anxiety disorder: Secondary | ICD-10-CM | POA: Diagnosis not present

## 2023-09-08 NOTE — Progress Notes (Unsigned)
  [name] [address] [place] [address] Phone: [phone] Subjective:   ISusannah Gully, am serving as a scribe for Dr. Arthea Claudene.  I'm seeing this patient by the request  of:  [name], NP  CC: Mid back and low back pain follow-up  [name]  AVIAN KONIGSBERG is a 38 y.o. male coming in with complaint of back and neck pain. OMT on 07/22/2023. Patient states doing well. No new symptoms.  Medications patient has been prescribed:   Taking:         Reviewed prior external information including notes and imaging from previsou exam, outside providers and external EMR if available.   As well as notes that were available from care everywhere and other healthcare systems.  Past medical history, social, surgical and family history all reviewed in electronic medical record.  No pertanent information unless stated regarding to the chief complaint.   Past Medical History:  Diagnosis Date   Acute cerebrovascular accident (CVA) of medulla oblongata (HCC) 09/09/2020   a.) small acute infarct of the lateral LEFT medulla in setting of LEFT vertebral artery dissection.   Anxiety    ASD (atrial septal defect) 09/14/2020   a.) TTE 09/14/2020: EF 65-70%; (+) IAS noted on color flow Doppler.   GERD (gastroesophageal reflux disease)    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    No Known Allergies   Review of Systems:  No headache, visual changes, nausea, vomiting, diarrhea, constipation, dizziness, abdominal pain, skin rash, fevers, chills, night sweats, weight loss, swollen lymph nodes, body aches, joint swelling, chest pain, shortness of breath, mood changes. POSITIVE muscle aches  Objective  There were no vitals taken for this visit.   General: No apparent distress alert and oriented x3 mood and affect normal, dressed appropriately.  HEENT: Pupils equal, extraocular movements intact  Respiratory:  Patient's speak in full sentences and does not appear short of breath  Cardiovascular: No lower extremity edema, non tender, no erythema  MSK:  Back low back does have some mild loss lordosis.  Still significant tightness noted in the parascapular area bilaterally.  Seems to be right greater than left.  Osteopathic findings  T3 extended rotated and side bent right inhaled rib T9 extended rotated and side bent left L2 flexed rotated and side bent right Sacrum right on right       Assessment and Plan:  No problem-specific Assessment & Plan notes found for this encounter.    Nonallopathic problems  Decision today to treat with OMT was based on Physical Exam  After verbal consent patient was treated with  ME, FPR techniques in  rib, thoracic, lumbar, and sacral  areas  Patient tolerated the procedure well with improvement in symptoms  Patient given exercises, stretches and lifestyle modifications  See medications in patient instructions if given  Patient will follow up in 4-8 weeks     The above documentation has been reviewed and is accurate and complete Jearl Soto M Meeghan Skipper, DO         Note: This dictation was prepared with Dragon dictation along with smaller phrase technology. Any transcriptional errors that result from this process are unintentional.

## 2023-09-16 ENCOUNTER — Ambulatory Visit: Admitting: Family Medicine

## 2023-09-16 ENCOUNTER — Encounter: Payer: Self-pay | Admitting: Family Medicine

## 2023-09-16 VITALS — BP 118/92 | HR 78 | Ht 72.0 in | Wt 258.0 lb

## 2023-09-16 DIAGNOSIS — G8929 Other chronic pain: Secondary | ICD-10-CM

## 2023-09-16 DIAGNOSIS — M9904 Segmental and somatic dysfunction of sacral region: Secondary | ICD-10-CM

## 2023-09-16 DIAGNOSIS — M9902 Segmental and somatic dysfunction of thoracic region: Secondary | ICD-10-CM | POA: Diagnosis not present

## 2023-09-16 DIAGNOSIS — M546 Pain in thoracic spine: Secondary | ICD-10-CM

## 2023-09-16 DIAGNOSIS — M9903 Segmental and somatic dysfunction of lumbar region: Secondary | ICD-10-CM | POA: Diagnosis not present

## 2023-09-16 NOTE — Assessment & Plan Note (Signed)
 Continue with the hypermobility.  Responds extremely well though to osteopathic manipulation.  Will continue to monitor.  Has been working still strengthening and loss.  Has gained a little bit since last visit and will try to work on it.  Would like to get BMI under 30 ideally.

## 2023-09-16 NOTE — Patient Instructions (Signed)
 Good to see you!  See you again in 8 weeks

## 2023-09-30 DIAGNOSIS — F411 Generalized anxiety disorder: Secondary | ICD-10-CM | POA: Diagnosis not present

## 2023-10-01 ENCOUNTER — Other Ambulatory Visit: Payer: Self-pay | Admitting: Nurse Practitioner

## 2023-10-01 NOTE — Telephone Encounter (Signed)
 Requested Prescriptions  Pending Prescriptions Disp Refills   atorvastatin  (LIPITOR) 20 MG tablet [Pharmacy Med Name: ATORVASTATIN  20 MG TABLET] 90 tablet 1    Sig: TAKE 1 TABLET BY MOUTH EVERY DAY     Cardiovascular:  Antilipid - Statins Failed - 10/01/2023 10:19 AM      Failed - Lipid Panel in normal range within the last 12 months    Cholesterol, Total  Date Value Ref Range Status  07/07/2023 164 100 - 199 mg/dL Final   LDL Chol Calc (NIH)  Date Value Ref Range Status  07/07/2023 101 (H) 0 - 99 mg/dL Final   HDL  Date Value Ref Range Status  07/07/2023 50 >39 mg/dL Final   Triglycerides  Date Value Ref Range Status  07/07/2023 67 0 - 149 mg/dL Final         Passed - Patient is not pregnant      Passed - Valid encounter within last 12 months    Recent Outpatient Visits           1 month ago Attention deficit hyperactivity disorder (ADHD), predominantly inattentive type   [place] [facility] [name], NP   2 months ago Annual physical exam   [place] [facility] [name], NP       Future Appointments             In 1 month Claudene Arthea HERO, DO [facility]            Refused Prescriptions Disp Refills   omeprazole  (PRILOSEC) 20 MG capsule [Pharmacy Med Name: OMEPRAZOLE  DR 20 MG CAPSULE] 90 capsule 1    Sig: TAKE 1 CAPSULE BY MOUTH EVERY DAY     Gastroenterology: Proton Pump Inhibitors Passed - 10/01/2023 10:19 AM      Passed - Valid encounter within last 12 months    Recent Outpatient Visits           1 month ago Attention deficit hyperactivity disorder (ADHD), predominantly inattentive type   [place] [facility] [name], NP   2 months ago Annual physical exam   [facility] [name], NP       Future Appointments             In 1 month Claudene Arthea HERO, DO [facility]             FLUoxetine  (PROZAC ) 40 MG capsule [Pharmacy Med Name: FLUOXETINE  HCL 40 MG CAPSULE] 90 capsule 1    Sig: TAKE 1 CAPSULE (40 MG TOTAL) BY MOUTH DAILY.     Psychiatry:  Antidepressants - SSRI Passed - 10/01/2023 10:19 AM      Passed - Valid encounter within last 6 months    Recent Outpatient Visits           1 month ago Attention deficit hyperactivity disorder (ADHD), predominantly inattentive type   [place] [facility] [name], NP   2 months ago Annual physical exam   [place] [facility] [name], NP       Future Appointments             In 1 month Claudene Arthea HERO, DO [facility]

## 2023-10-19 ENCOUNTER — Encounter: Payer: Self-pay | Admitting: Nurse Practitioner

## 2023-10-26 DIAGNOSIS — F411 Generalized anxiety disorder: Secondary | ICD-10-CM | POA: Diagnosis not present

## 2023-11-09 ENCOUNTER — Ambulatory Visit: Admitting: Nurse Practitioner

## 2023-11-09 NOTE — Progress Notes (Unsigned)
  [name] [address] [place] [address] Phone: [phone] Subjective:   [name], am serving as a scribe for Dr. Arthea Claudene.  I'm seeing this patient by the request  of:  [name], NP  CC:   Curtis [name]  [name] is a 38 y.o. male coming in with complaint of back and neck pain. OMT on 09/16/2023. Patient states that he is tight thoughout his back.   Medications patient has been prescribed:   Taking:         Reviewed prior external information including notes and imaging from previsou exam, outside providers and external EMR if available.   As well as notes that were available from care everywhere and other healthcare systems.  Past medical history, social, surgical and family history all reviewed in electronic medical record.  No pertanent information unless stated regarding to the chief complaint.   Past Medical History:  Diagnosis Date   Acute cerebrovascular accident (CVA) of medulla oblongata (HCC) 09/09/2020   a.) small acute infarct of the lateral LEFT medulla in setting of LEFT vertebral artery dissection.   Anxiety    ASD (atrial septal defect) 09/14/2020   a.) TTE 09/14/2020: EF 65-70%; (+) IAS noted on color flow Doppler.   GERD (gastroesophageal reflux disease)    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    No Known Allergies   Review of Systems:  No headache, visual changes, nausea, vomiting, diarrhea, constipation, dizziness, abdominal pain, skin rash, fevers, chills, night sweats, weight loss, swollen lymph nodes, body aches, joint swelling, chest pain, shortness of breath, mood changes. POSITIVE muscle aches  Objective  Blood pressure (!) 126/90, pulse 76, height 6' (1.829 m), weight 263 lb (119.3 kg), SpO2 98%.   General: No apparent distress alert and oriented x3 mood and affect normal, dressed appropriately.  HEENT: Pupils equal, extraocular movements  intact  Respiratory: Patient's speak in full sentences and does not appear short of breath  Cardiovascular: No lower extremity edema, non tender, no erythema  Gait normal MSK:  Back hypermobility noted.  Tightness noted parascapular area as well.  Tightness noted.  Osteopathic findings  T9 extended rotated and side bent left T11 extended rotated and side bent left L2 flexed rotated and side bent right Sacrum right on right    Assessment and Plan:  Thoracic back pain Continue to work on strengthening.  Patient has done well with weight loss but has some more willing to go.  Discussed with her activities to do and which ones to avoid.  Discussed proper lifting techniques and follow-up again in 6 to 8 weeks for further evaluation    Nonallopathic problems  Decision today to treat with OMT was based on Physical Exam  After verbal consent patient was treated with  ME, FPR techniques in , thoracic, lumbar, and sacral  areas  Patient tolerated the procedure well with improvement in symptoms  Patient given exercises, stretches and lifestyle modifications  See medications in patient instructions if given  Patient will follow up in 4-8 weeks     The above documentation has been reviewed and is accurate and complete [name] [name], DO         Note: This dictation was prepared with Dragon dictation along with smaller phrase technology. Any transcriptional errors that result from this process are unintentional.

## 2023-11-10 ENCOUNTER — Telehealth: Admitting: Nurse Practitioner

## 2023-11-10 ENCOUNTER — Encounter: Payer: Self-pay | Admitting: Nurse Practitioner

## 2023-11-10 DIAGNOSIS — F900 Attention-deficit hyperactivity disorder, predominantly inattentive type: Secondary | ICD-10-CM | POA: Diagnosis not present

## 2023-11-10 MED ORDER — DEXMETHYLPHENIDATE HCL ER 20 MG PO CP24: 20.0000 mg | ORAL_CAPSULE | Freq: Every day | ORAL | 0 refills | Status: AC

## 2023-11-10 NOTE — Progress Notes (Signed)
 There were no vitals taken for this visit.   Subjective:    Patient ID: Curtis [name], male    DOB: Aug 03, 1985, 38 y.o.   MRN: [id number]  HPI: Curtis [name] is a 38 y.o. male  Chief Complaint  Patient presents with   ADHD   ADHD Last visit patient was started on the Focalin .  He feels like it is working for him but could be increased.  He isn't taking it everyday.  But has been more consistent lately.  He feels like this is a good dose for him.   ADHD status: controlled Satisfied with current therapy: no Medication compliance:  excellent compliance Controlled substance contract: no Previous psychiatry evaluation: yes Previous medications: yes ritalin [place]   Taking meds on weekends/vacations: no Work/school performance:  good Difficulty sustaining attention/completing tasks: yes Distracted by extraneous stimuli: yes Does not listen when spoken to: no  Fidgets with hands or feet: yes Unable to stay in seat: no Blurts out/interrupts others: yes    Relevant past medical, surgical, family and social history reviewed and updated as indicated. Interim medical history since our last visit reviewed. Allergies and medications reviewed and updated.  Review of Systems  Psychiatric/Behavioral:  Positive for decreased concentration.     Per HPI unless specifically indicated above     Objective:    There were no vitals taken for this visit.  Wt Readings from Last 3 Encounters:  09/16/23 258 lb (117 kg)  07/22/23 253 lb (114.8 kg)  07/07/23 253 lb (114.8 kg)    Physical Exam Vitals and nursing note reviewed.  Constitutional:      General: He is not in acute distress.    Appearance: He is not ill-appearing.  HENT:     Head: Normocephalic.     Right Ear: Hearing normal.     Left Ear: Hearing normal.     Nose: Nose normal.  Pulmonary:     Effort: Pulmonary effort is normal. No respiratory distress.  Neurological:     Mental Status: He is alert.  Psychiatric:         Mood and Affect: Mood normal.        Behavior: Behavior normal.        Thought Content: Thought content normal.        Judgment: Judgment normal.     Results for orders placed or performed in visit on 07/07/23  TSH   Collection Time: 07/07/23  9:13 AM  Result Value Ref Range   TSH 0.711 0.450 - 4.500 uIU/mL  Lipid panel   Collection Time: 07/07/23  9:13 AM  Result Value Ref Range   Cholesterol, Total 164 100 - 199 mg/dL   Triglycerides 67 0 - 149 mg/dL   HDL 50 >60 mg/dL   VLDL Cholesterol Cal 13 5 - 40 mg/dL   LDL Chol Calc (NIH) 898 (H) 0 - 99 mg/dL   Chol/HDL Ratio 3.3 0.0 - 5.0 ratio  CBC with Differential/Platelet   Collection Time: 07/07/23  9:13 AM  Result Value Ref Range   WBC 9.6 3.4 - 10.8 x10E3/uL   RBC 5.62 4.14 - 5.80 x10E6/uL   Hemoglobin 16.8 13.0 - 17.7 g/dL   Hematocrit 49.0 62.4 - 51.0 %   MCV 91 79 - 97 fL   MCH 29.9 26.6 - 33.0 pg   MCHC 33.0 31.5 - 35.7 g/dL   RDW 87.2 88.3 - 84.5 %   Platelets 290 150 - 450 x10E3/uL   Neutrophils 61  Not Estab. %   Lymphs 27 Not Estab. %   Monocytes 8 Not Estab. %   Eos 4 Not Estab. %   Basos 0 Not Estab. %   Neutrophils Absolute 5.8 1.4 - 7.0 x10E3/uL   Lymphocytes Absolute 2.6 0.7 - 3.1 x10E3/uL   Monocytes Absolute 0.8 0.1 - 0.9 x10E3/uL   EOS (ABSOLUTE) 0.4 0.0 - 0.4 x10E3/uL   Basophils Absolute 0.0 0.0 - 0.2 x10E3/uL   Immature Granulocytes 0 Not Estab. %   Immature Grans (Abs) 0.0 0.0 - 0.1 x10E3/uL  Comprehensive metabolic panel with GFR   Collection Time: 07/07/23  9:13 AM  Result Value Ref Range   Glucose 79 70 - 99 mg/dL   BUN 14 6 - 20 mg/dL   Creatinine, Ser 9.05 0.76 - 1.27 mg/dL   eGFR 891 >40 fO/fpw/8.26   BUN/Creatinine Ratio 15 9 - 20   Sodium 139 134 - 144 mmol/L   Potassium 4.8 3.5 - 5.2 mmol/L   Chloride 104 96 - 106 mmol/L   CO2 21 20 - 29 mmol/L   Calcium  9.3 8.7 - 10.2 mg/dL   Total Protein 6.6 6.0 - 8.5 g/dL   Albumin 4.4 4.1 - 5.1 g/dL   Globulin, Total 2.2 1.5 - 4.5 g/dL    Bilirubin Total 0.3 0.0 - 1.2 mg/dL   Alkaline Phosphatase 104 44 - 121 IU/L   AST 20 0 - 40 IU/L   ALT 15 0 - 44 IU/L      Assessment & Plan:   Problem List Items Addressed This Visit   None     Follow up plan: No follow-ups on file.   This visit was completed via MyChart due to the restrictions of the COVID-19 pandemic. All issues as above were discussed and addressed. Physical exam was done as above through visual confirmation on MyChart. If it was felt that the patient should be evaluated in the office, they were directed there. The patient verbally consented to this visit. Location of the patient: Home Location of the provider: Office Those involved with this call:  Provider: Darice Petty, NP CMA: Laymon Metro, CMA Front Desk/Registration: Claretta Maiden This encounter was conducted via video.  I spent 30 minutes dedicated to the care of this patient on the date of this encounter to include previsit review of plan of care, medication, and follow up, face to face time with the patient, and post visit ordering of testing.

## 2023-11-10 NOTE — Assessment & Plan Note (Signed)
 Chronic.  Improved with Focalin .  Continue with Focalin  20mg  daily.  Prescription sent in for patient today.  Encouraged patient to take medication holidays.  Will follow up with patient in 3 months.  Needs controlled substance agreement and UDS at next visit.

## 2023-11-10 NOTE — Progress Notes (Signed)
 Scheduled

## 2023-11-11 ENCOUNTER — Encounter: Payer: Self-pay | Admitting: Family Medicine

## 2023-11-11 ENCOUNTER — Ambulatory Visit: Admitting: Family Medicine

## 2023-11-11 VITALS — BP 126/90 | HR 76 | Ht 72.0 in | Wt 263.0 lb

## 2023-11-11 DIAGNOSIS — M9904 Segmental and somatic dysfunction of sacral region: Secondary | ICD-10-CM

## 2023-11-11 DIAGNOSIS — M546 Pain in thoracic spine: Secondary | ICD-10-CM | POA: Diagnosis not present

## 2023-11-11 DIAGNOSIS — M9902 Segmental and somatic dysfunction of thoracic region: Secondary | ICD-10-CM

## 2023-11-11 DIAGNOSIS — G8929 Other chronic pain: Secondary | ICD-10-CM

## 2023-11-11 DIAGNOSIS — M9903 Segmental and somatic dysfunction of lumbar region: Secondary | ICD-10-CM | POA: Diagnosis not present

## 2023-11-11 NOTE — Assessment & Plan Note (Signed)
 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  Patient has done well with weight loss but has some more willing to go.  Discussed with her activities to do and which ones to avoid.  Discussed proper lifting techniques and follow-up again in 6 to 8 weeks for further evaluation

## 2023-11-12 ENCOUNTER — Ambulatory Visit: Admitting: Nurse Practitioner

## 2023-11-12 NOTE — Progress Notes (Signed)
 DONTA FUSTER                                          MRN: [id number]   11/12/2023   The [facility] Quality Team Specialist reviewed this patient medical record for the purposes of chart review for care gap closure. The following were reviewed: chart review for care gap closure-controlling blood pressure.    [facility] Quality Team

## 2023-12-06 ENCOUNTER — Encounter: Payer: Self-pay | Admitting: Nurse Practitioner

## 2023-12-08 ENCOUNTER — Encounter: Payer: Self-pay | Admitting: Nurse Practitioner

## 2023-12-08 ENCOUNTER — Ambulatory Visit: Admitting: Nurse Practitioner

## 2023-12-08 VITALS — BP 138/86 | HR 81 | Temp 98.0°F | Ht 72.0 in | Wt 267.4 lb

## 2023-12-08 DIAGNOSIS — Z23 Encounter for immunization: Secondary | ICD-10-CM | POA: Diagnosis not present

## 2023-12-08 DIAGNOSIS — R42 Dizziness and giddiness: Secondary | ICD-10-CM | POA: Diagnosis not present

## 2023-12-08 MED ORDER — METHYLPREDNISOLONE 4 MG PO TBPK: ORAL_TABLET | ORAL | 0 refills | Status: AC

## 2023-12-08 NOTE — Progress Notes (Signed)
 BP 138/86   Pulse 81   Temp 98 F (36.7 C) (Oral)   Ht 6' (1.829 m)   Wt 267 lb 6.4 oz (121.3 kg)   SpO2 98%   BMI 36.27 kg/m    Subjective:    Patient ID: Curtis [name], male    DOB: Nov 05, 1985, 38 y.o.   MRN: [id number]  HPI: Curtis [name] is a 38 y.o. male  Chief Complaint  Patient presents with   Dizziness    Patient states he has been experiencing dizziness since Sunday. States he mainly experiences the dizziness in the mornings.    DIZZINESS Patient states on Sunday morning he experienced dizziness.   Duration: days Description of symptoms: Sunday the room was spinning Duration of episode: about 1 hour Dizziness frequency: no history of the same Provoking factors: none Aggravating factors:  none Triggered by rolling over in bed: no Triggered by bending over: no Aggravated by head movement: yes Aggravated by exertion, coughing, loud noises: no Recent head injury: no Recent or current viral symptoms: yes History of vasovagal episodes: no Nausea: yes Vomiting: no Tinnitus: no Hearing loss: no Aural fullness: no Headache: yes Photophobia/phonophobia: yes Unsteady gait: no Postural instability: no Diplopia, dysarthria, dysphagia or weakness: yes Related to exertion: no Pallor: no Diaphoresis: yes Dyspnea: no Chest pain: no  Relevant past medical, surgical, family and social history reviewed and updated as indicated. Interim medical history since our last visit reviewed. Allergies and medications reviewed and updated.  Review of Systems  Constitutional:  Negative for diaphoresis.  HENT:  Positive for congestion. Negative for tinnitus.   Eyes:  Negative for photophobia and visual disturbance.  Respiratory:  Negative for shortness of breath.   Cardiovascular:  Negative for chest pain.  Gastrointestinal:  Positive for nausea. Negative for vomiting.  Neurological:  Positive for dizziness and headaches.    Per HPI unless specifically indicated  above     Objective:    BP 138/86   Pulse 81   Temp 98 F (36.7 C) (Oral)   Ht 6' (1.829 m)   Wt 267 lb 6.4 oz (121.3 kg)   SpO2 98%   BMI 36.27 kg/m   Wt Readings from Last 3 Encounters:  12/08/23 267 lb 6.4 oz (121.3 kg)  11/11/23 263 lb (119.3 kg)  09/16/23 258 lb (117 kg)    Physical Exam Vitals and nursing note reviewed.  Constitutional:      General: He is not in acute distress.    Appearance: Normal appearance. He is not ill-appearing, toxic-appearing or diaphoretic.  HENT:     Head: Normocephalic.     Right Ear: External ear normal. A middle ear effusion is present.     Left Ear: External ear normal. A middle ear effusion is present.     Nose: Nose normal. No congestion or rhinorrhea.     Mouth/Throat:     Mouth: Mucous membranes are moist.  Eyes:     General:        Right eye: No discharge.        Left eye: No discharge.     Extraocular Movements: Extraocular movements intact.     Conjunctiva/sclera: Conjunctivae normal.     Pupils: Pupils are equal, round, and reactive to light.  Cardiovascular:     Rate and Rhythm: Normal rate and regular rhythm.     Heart sounds: No murmur heard. Pulmonary:     Effort: Pulmonary effort is normal. No respiratory distress.  Breath sounds: Normal breath sounds. No wheezing, rhonchi or rales.  Abdominal:     General: Abdomen is flat. Bowel sounds are normal.  Musculoskeletal:     Cervical back: Normal range of motion and neck supple.  Skin:    General: Skin is warm and dry.     Capillary Refill: Capillary refill takes less than 2 seconds.  Neurological:     General: No focal deficit present.     Mental Status: He is alert and oriented to person, place, and time.     Cranial Nerves: Cranial nerves 2-12 are intact.     Sensory: Sensation is intact.     Motor: Motor function is intact.     Coordination: Coordination is intact.     Gait: Gait is intact.  Psychiatric:        Mood and Affect: Mood normal.         Behavior: Behavior normal.        Thought Content: Thought content normal.        Judgment: Judgment normal.     Results for orders placed or performed in visit on 07/07/23  TSH   Collection Time: 07/07/23  9:13 AM  Result Value Ref Range   TSH 0.711 0.450 - 4.500 uIU/mL  Lipid panel   Collection Time: 07/07/23  9:13 AM  Result Value Ref Range   Cholesterol, Total 164 100 - 199 mg/dL   Triglycerides 67 0 - 149 mg/dL   HDL 50 >60 mg/dL   VLDL Cholesterol Cal 13 5 - 40 mg/dL   LDL Chol Calc (NIH) 898 (H) 0 - 99 mg/dL   Chol/HDL Ratio 3.3 0.0 - 5.0 ratio  CBC with Differential/Platelet   Collection Time: 07/07/23  9:13 AM  Result Value Ref Range   WBC 9.6 3.4 - 10.8 x10E3/uL   RBC 5.62 4.14 - 5.80 x10E6/uL   Hemoglobin 16.8 13.0 - 17.7 g/dL   Hematocrit 49.0 62.4 - 51.0 %   MCV 91 79 - 97 fL   MCH 29.9 26.6 - 33.0 pg   MCHC 33.0 31.5 - 35.7 g/dL   RDW 87.2 88.3 - 84.5 %   Platelets 290 150 - 450 x10E3/uL   Neutrophils 61 Not Estab. %   Lymphs 27 Not Estab. %   Monocytes 8 Not Estab. %   Eos 4 Not Estab. %   Basos 0 Not Estab. %   Neutrophils Absolute 5.8 1.4 - 7.0 x10E3/uL   Lymphocytes Absolute 2.6 0.7 - 3.1 x10E3/uL   Monocytes Absolute 0.8 0.1 - 0.9 x10E3/uL   EOS (ABSOLUTE) 0.4 0.0 - 0.4 x10E3/uL   Basophils Absolute 0.0 0.0 - 0.2 x10E3/uL   Immature Granulocytes 0 Not Estab. %   Immature Grans (Abs) 0.0 0.0 - 0.1 x10E3/uL  Comprehensive metabolic panel with GFR   Collection Time: 07/07/23  9:13 AM  Result Value Ref Range   Glucose 79 70 - 99 mg/dL   BUN 14 6 - 20 mg/dL   Creatinine, Ser 9.05 0.76 - 1.27 mg/dL   eGFR 891 >40 fO/fpw/8.26   BUN/Creatinine Ratio 15 9 - 20   Sodium 139 134 - 144 mmol/L   Potassium 4.8 3.5 - 5.2 mmol/L   Chloride 104 96 - 106 mmol/L   CO2 21 20 - 29 mmol/L   Calcium  9.3 8.7 - 10.2 mg/dL   Total Protein 6.6 6.0 - 8.5 g/dL   Albumin 4.4 4.1 - 5.1 g/dL   Globulin, Total 2.2 1.5 - 4.5 g/dL  Bilirubin Total 0.3 0.0 - 1.2 mg/dL    Alkaline Phosphatase 104 44 - 121 IU/L   AST 20 0 - 40 IU/L   ALT 15 0 - 44 IU/L      Assessment & Plan:   Problem List Items Addressed This Visit   None Visit Diagnoses       Need for influenza vaccination    -  Primary   Relevant Orders   Flu vaccine trivalent PF, 6mos and older(Flulaval,Afluria,Fluarix,Fluzone)     Dizziness       Suspect it is related to middle ear effusion. Discussed vasovagal symptoms that likely occured. Will treat with medrol dose pak. Labs ordered to rule out causes   Relevant Orders   Anemia Profile B   Comp Met (CMET)   HgB A1c        Follow up plan: Return if symptoms worsen or fail to improve.

## 2023-12-09 ENCOUNTER — Ambulatory Visit: Payer: Self-pay | Admitting: Nurse Practitioner

## 2023-12-09 DIAGNOSIS — D72829 Elevated white blood cell count, unspecified: Secondary | ICD-10-CM

## 2023-12-09 LAB — ANEMIA PROFILE B
Basophils Absolute: 0.1 x10E3/uL (ref 0.0–0.2)
Basos: 1 %
EOS (ABSOLUTE): 0.3 x10E3/uL (ref 0.0–0.4)
Eos: 2 %
Ferritin: 71 ng/mL (ref 30–400)
Folate: 3.3 ng/mL (ref >3.0)
Hematocrit: 50.6 % (ref 37.5–51.0)
Hemoglobin: 17.2 g/dL (ref 13.0–17.7)
Immature Grans (Abs): 0 x10E3/uL (ref 0.0–0.1)
Immature Granulocytes: 0 %
Iron Saturation: 24 % (ref 15–55)
Iron: 65 ug/dL (ref 38–169)
Lymphocytes Absolute: 3.1 x10E3/uL (ref 0.7–3.1)
Lymphs: 22 %
MCH: 29.9 pg (ref 26.6–33.0)
MCHC: 34 g/dL (ref 31.5–35.7)
MCV: 88 fL (ref 79–97)
Monocytes Absolute: 1.1 x10E3/uL — ABNORMAL HIGH (ref 0.1–0.9)
Monocytes: 8 %
Neutrophils Absolute: 9.3 x10E3/uL — ABNORMAL HIGH (ref 1.4–7.0)
Neutrophils: 67 %
Platelets: 311 x10E3/uL (ref 150–450)
RBC: 5.76 x10E6/uL (ref 4.14–5.80)
RDW: 12.5 % (ref 11.6–15.4)
Retic Ct Pct: 1.1 % (ref 0.6–2.6)
Total Iron Binding Capacity: 270 ug/dL (ref 250–450)
UIBC: 205 ug/dL (ref 111–343)
Vitamin B-12: 373 pg/mL (ref 232–1245)
WBC: 13.9 x10E3/uL — ABNORMAL HIGH (ref 3.4–10.8)

## 2023-12-09 LAB — COMPREHENSIVE METABOLIC PANEL WITH GFR
ALT: 10 IU/L (ref 0–44)
AST: 13 IU/L (ref 0–40)
Albumin: 4.5 g/dL (ref 4.1–5.1)
Alkaline Phosphatase: 88 IU/L (ref 47–123)
BUN/Creatinine Ratio: 12 (ref 9–20)
BUN: 12 mg/dL (ref 6–20)
Bilirubin Total: 0.4 mg/dL (ref 0.0–1.2)
CO2: 22 mmol/L (ref 20–29)
Calcium: 9.6 mg/dL (ref 8.7–10.2)
Chloride: 102 mmol/L (ref 96–106)
Creatinine, Ser: 0.98 mg/dL (ref 0.76–1.27)
Globulin, Total: 2.3 g/dL (ref 1.5–4.5)
Glucose: 84 mg/dL (ref 70–99)
Potassium: 4.5 mmol/L (ref 3.5–5.2)
Sodium: 138 mmol/L (ref 134–144)
Total Protein: 6.8 g/dL (ref 6.0–8.5)
eGFR: 102 mL/min/1.73 (ref >59)

## 2023-12-09 LAB — HEMOGLOBIN A1C
Est. average glucose Bld gHb Est-mCnc: 105 mg/dL
Hgb A1c MFr Bld: 5.3 % (ref 4.8–5.6)

## 2023-12-10 ENCOUNTER — Encounter: Payer: Self-pay | Admitting: Nurse Practitioner

## 2023-12-10 MED ORDER — FLUOXETINE HCL 40 MG PO CAPS: 40.0000 mg | ORAL_CAPSULE | Freq: Every day | ORAL | 1 refills | Status: AC

## 2023-12-16 DIAGNOSIS — F411 Generalized anxiety disorder: Secondary | ICD-10-CM | POA: Diagnosis not present

## 2023-12-27 NOTE — Progress Notes (Unsigned)
 [name] [address] [place] [address] Phone: [phone] Subjective:   ISusannah [name], am serving as a scribe for Dr. Arthea Claudene.  I'm seeing this patient by the request  of:  [name], NP  CC: Back follow-up  [name]  [name] is a 38 y.o. male coming in with complaint of back pain. OMT on 11/11/2023. Patient states has been doing relatively well, still caring for her young children that seems to cause some discomfort from time to time.  Medications patient has been prescribed:   Taking:         Reviewed prior external information including notes and imaging from previsou exam, outside providers and external EMR if available.   As well as notes that were available from care everywhere and other healthcare systems.  Past medical history, social, surgical and family history all reviewed in electronic medical record.  No pertanent information unless stated regarding to the chief complaint.   Past Medical History:  Diagnosis Date   Acute cerebrovascular accident (CVA) of medulla oblongata (HCC) 09/09/2020   a.) small acute infarct of the lateral LEFT medulla in setting of LEFT vertebral artery dissection.   Anxiety    ASD (atrial septal defect) 09/14/2020   a.) TTE 09/14/2020: EF 65-70%; (+) IAS noted on color flow Doppler.   GERD (gastroesophageal reflux disease)    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    No Known Allergies   Review of Systems:  No headache, visual changes, nausea, vomiting, diarrhea, constipation, dizziness, abdominal pain, skin rash, fevers, chills, night sweats, weight loss, swollen lymph nodes, body aches, joint swelling, chest pain, shortness of breath, mood changes. POSITIVE muscle aches  Objective  Blood pressure 116/76, pulse 76, height 6' (1.829 m), weight 269 lb (122 kg), SpO2 98%.   General: No apparent distress alert and oriented x3 mood and  affect normal, dressed appropriately.  HEENT: Pupils equal, extraocular movements intact  Respiratory: Patient's speak in full sentences and does not appear short of breath  Cardiovascular: No lower extremity edema, non tender, no erythema  Gait MSK:  Back does not have loss lordosis noted.  Patient does have some tenderness to palpation noted mostly in the thoracic spine.  Seems to be between the shoulder blades  Osteopathic findings  T3 extended rotated and side bent right inhaled rib T6 extended rotated sidebent left with inhaled rib T9 extended rotated and side bent left T11 extended rotated sidebent right L2 flexed rotated and side bent right Sacrum right on right       Assessment and Plan:  Thoracic back pain Chronic secondary to some kyphosis and scoliosis.  Responds well to manipulation.  Continue to work on weight loss which patient has done relatively well overall.  He does have hopefully after the holidays we will see some improvement again.  Continue strengthening exercises.    Nonallopathic problems  Decision today to treat with OMT was based on Physical Exam  After verbal consent patient was treated with  ME, FPR techniques in  rib, thoracic, lumbar, and sacral  areas  Patient tolerated the procedure well with improvement in symptoms  Patient given exercises, stretches and lifestyle modifications  See medications in patient instructions if given  Patient will follow up in 4-8 weeks     The above documentation has been reviewed and is accurate and complete Lesha Jager M Jerimyah Vandunk, DO         Note:  This dictation was prepared with Dragon dictation along with smaller phrase technology. Any transcriptional errors that result from this process are unintentional.

## 2023-12-29 ENCOUNTER — Encounter: Payer: Self-pay | Admitting: Family Medicine

## 2023-12-29 ENCOUNTER — Ambulatory Visit: Admitting: Family Medicine

## 2023-12-29 VITALS — BP 116/76 | HR 76 | Ht 72.0 in | Wt 269.0 lb

## 2023-12-29 DIAGNOSIS — M9902 Segmental and somatic dysfunction of thoracic region: Secondary | ICD-10-CM

## 2023-12-29 DIAGNOSIS — M9904 Segmental and somatic dysfunction of sacral region: Secondary | ICD-10-CM

## 2023-12-29 DIAGNOSIS — G8929 Other chronic pain: Secondary | ICD-10-CM

## 2023-12-29 DIAGNOSIS — M546 Pain in thoracic spine: Secondary | ICD-10-CM | POA: Diagnosis not present

## 2023-12-29 DIAGNOSIS — M9903 Segmental and somatic dysfunction of lumbar region: Secondary | ICD-10-CM | POA: Diagnosis not present

## 2023-12-29 NOTE — Assessment & Plan Note (Signed)
 Chronic secondary to some kyphosis and scoliosis.  Responds well to manipulation.  Continue to work on weight loss which patient has done relatively well overall.  He does have hopefully after the holidays we will see some improvement again.  Continue strengthening exercises.

## 2023-12-30 DIAGNOSIS — F411 Generalized anxiety disorder: Secondary | ICD-10-CM | POA: Diagnosis not present

## 2024-01-13 DIAGNOSIS — F411 Generalized anxiety disorder: Secondary | ICD-10-CM | POA: Diagnosis not present

## 2024-02-10 ENCOUNTER — Encounter: Payer: Self-pay | Admitting: Nurse Practitioner

## 2024-02-10 ENCOUNTER — Ambulatory Visit: Admitting: Nurse Practitioner

## 2024-02-10 VITALS — BP 106/79 | HR 57 | Temp 98.1°F | Ht 72.01 in | Wt 296.3 lb

## 2024-02-10 DIAGNOSIS — F900 Attention-deficit hyperactivity disorder, predominantly inattentive type: Secondary | ICD-10-CM | POA: Diagnosis not present

## 2024-02-10 NOTE — Assessment & Plan Note (Signed)
 Chronic.  Controlled with Focalin .  Continue with Focalin  20mg  daily.  No prescriptions sent for patient.  Last prescription was picked up in July.  Prescriptions from last appointment are still at the pharmacy for patient.  Will follow up with patient in 3 months.

## 2024-02-10 NOTE — Progress Notes (Signed)
 "  BP 106/79 (BP Location: Left Arm, Patient Position: Sitting, Cuff Size: Large)   Pulse (!) 57   Temp 98.1 F (36.7 C) (Oral)   Ht 6' 0.01 (1.829 m)   Wt 296 lb 4.8 oz (134.4 kg)   SpO2 98%   BMI 40.18 kg/m    Subjective:    Patient ID: Curtis [name], male    DOB: 04-16-1985, 39 y.o.   MRN: [id number]  HPI: Curtis [name] is a 39 y.o. male  Chief Complaint  Patient presents with   ADHD    3 month F/u   ADHD Last visit patient was started on the Focalin .  He feels like this is a good dose for him.  Denies any side effects of the medication. He is taking the medicaiton 5x weekly. ADHD status: controlled Satisfied with current therapy: no Medication compliance:  excellent compliance Controlled substance contract: no Previous psychiatry evaluation: yes Previous medications: yes ritalin LA   Taking meds on weekends/vacations: no Work/school performance:  good Difficulty sustaining attention/completing tasks: yes Distracted by extraneous stimuli: yes Does not listen when spoken to: no  Fidgets with hands or feet: yes Unable to stay in seat: no Blurts out/interrupts others: yes    Relevant past medical, surgical, family and social history reviewed and updated as indicated. Interim medical history since our last visit reviewed. Allergies and medications reviewed and updated.  Review of Systems  Psychiatric/Behavioral:  Positive for decreased concentration.     Per HPI unless specifically indicated above     Objective:    BP 106/79 (BP Location: Left Arm, Patient Position: Sitting, Cuff Size: Large)   Pulse (!) 57   Temp 98.1 F (36.7 C) (Oral)   Ht 6' 0.01 (1.829 m)   Wt 296 lb 4.8 oz (134.4 kg)   SpO2 98%   BMI 40.18 kg/m   Wt Readings from Last 3 Encounters:  02/10/24 296 lb 4.8 oz (134.4 kg)  12/29/23 269 lb (122 kg)  12/08/23 267 lb 6.4 oz (121.3 kg)    Physical Exam Vitals and nursing note reviewed.  Constitutional:      General: He is not in  acute distress.    Appearance: Normal appearance. He is not ill-appearing, toxic-appearing or diaphoretic.  HENT:     Head: Normocephalic.     Right Ear: External ear normal.     Left Ear: External ear normal.     Nose: Nose normal. No congestion or rhinorrhea.     Mouth/Throat:     Mouth: Mucous membranes are moist.  Eyes:     General:        Right eye: No discharge.        Left eye: No discharge.     Extraocular Movements: Extraocular movements intact.     Conjunctiva/sclera: Conjunctivae normal.     Pupils: Pupils are equal, round, and reactive to light.  Cardiovascular:     Rate and Rhythm: Normal rate and regular rhythm.     Heart sounds: No murmur heard. Pulmonary:     Effort: Pulmonary effort is normal. No respiratory distress.     Breath sounds: Normal breath sounds. No wheezing, rhonchi or rales.  Abdominal:     General: Abdomen is flat. Bowel sounds are normal.  Musculoskeletal:     Cervical back: Normal range of motion and neck supple.  Skin:    General: Skin is warm and dry.     Capillary Refill: Capillary refill takes less than 2 seconds.  Neurological:     General: No focal deficit present.     Mental Status: He is alert and oriented to person, place, and time.  Psychiatric:        Mood and Affect: Mood normal.        Behavior: Behavior normal.        Thought Content: Thought content normal.        Judgment: Judgment normal.     Results for orders placed or performed in visit on 12/08/23  Anemia Profile B   Collection Time: 12/08/23  2:18 PM  Result Value Ref Range   Total Iron Binding Capacity 270 250 - 450 ug/dL   UIBC 794 888 - 656 ug/dL   Iron 65 38 - 830 ug/dL   Iron Saturation 24 15 - 55 %   Ferritin 71 30 - 400 ng/mL   Vitamin B-12 373 232 - 1,245 pg/mL   Folate 3.3 >3.0 ng/mL   WBC 13.9 (H) 3.4 - 10.8 x10E3/uL   RBC 5.76 4.14 - 5.80 x10E6/uL   Hemoglobin 17.2 13.0 - 17.7 g/dL   Hematocrit 49.3 62.4 - 51.0 %   MCV 88 79 - 97 fL   MCH 29.9  26.6 - 33.0 pg   MCHC 34.0 31.5 - 35.7 g/dL   RDW 87.4 88.3 - 84.5 %   Platelets 311 150 - 450 x10E3/uL   Neutrophils 67 Not Estab. %   Lymphs 22 Not Estab. %   Monocytes 8 Not Estab. %   Eos 2 Not Estab. %   Basos 1 Not Estab. %   Neutrophils Absolute 9.3 (H) 1.4 - 7.0 x10E3/uL   Lymphocytes Absolute 3.1 0.7 - 3.1 x10E3/uL   Monocytes Absolute 1.1 (H) 0.1 - 0.9 x10E3/uL   EOS (ABSOLUTE) 0.3 0.0 - 0.4 x10E3/uL   Basophils Absolute 0.1 0.0 - 0.2 x10E3/uL   Immature Granulocytes 0 Not Estab. %   Immature Grans (Abs) 0.0 0.0 - 0.1 x10E3/uL   Retic Ct Pct 1.1 0.6 - 2.6 %  Comp Met (CMET)   Collection Time: 12/08/23  2:18 PM  Result Value Ref Range   Glucose 84 70 - 99 mg/dL   BUN 12 6 - 20 mg/dL   Creatinine, Ser 9.01 0.76 - 1.27 mg/dL   eGFR 897 >40 fO/fpw/8.26   BUN/Creatinine Ratio 12 9 - 20   Sodium 138 134 - 144 mmol/L   Potassium 4.5 3.5 - 5.2 mmol/L   Chloride 102 96 - 106 mmol/L   CO2 22 20 - 29 mmol/L   Calcium  9.6 8.7 - 10.2 mg/dL   Total Protein 6.8 6.0 - 8.5 g/dL   Albumin 4.5 4.1 - 5.1 g/dL   Globulin, Total 2.3 1.5 - 4.5 g/dL   Bilirubin Total 0.4 0.0 - 1.2 mg/dL   Alkaline Phosphatase 88 47 - 123 IU/L   AST 13 0 - 40 IU/L   ALT 10 0 - 44 IU/L  HgB A1c   Collection Time: 12/08/23  2:18 PM  Result Value Ref Range   Hgb A1c MFr Bld 5.3 4.8 - 5.6 %   Est. average glucose Bld gHb Est-mCnc 105 mg/dL      Assessment & Plan:   Problem List Items Addressed This Visit       Other   ADD (attention deficit disorder) - Primary   Chronic.  Controlled with Focalin .  Continue with Focalin  20mg  daily.  No prescriptions sent for patient.  Last prescription was picked up in July.  Prescriptions from  last appointment are still at the pharmacy for patient.  Will follow up with patient in 3 months.           Follow up plan: Return in about 3 months (around 05/10/2024) for ADHD FU (virtual).   This visit was completed via MyChart due to the restrictions of the COVID-19  pandemic. All issues as above were discussed and addressed. Physical exam was done as above through visual confirmation on MyChart. If it was felt that the patient should be evaluated in the office, they were directed there. The patient verbally consented to this visit. Location of the patient: Home Location of the provider: Office Those involved with this call:  Provider: Darice Petty, NP CMA: Laymon Metro, CMA Front Desk/Registration: Claretta Maiden This encounter was conducted via video.  I spent 30 minutes dedicated to the care of this patient on the date of this encounter to include previsit review of plan of care, medication, and follow up, face to face time with the patient, and post visit ordering of testing.      "

## 2024-02-16 NOTE — Progress Notes (Unsigned)
 " [name] [address] [place] [address] Phone: [phone] Subjective:   Curtis [name] am a scribe for Dr. Claudene.   I'[name] seeing this patient by the request  of:  Curtis Pao, NP  CC: Back pain  [name]  Curtis [name] is a 39 y.o. male coming in with complaint of back pain. OMT on 12/29/2023. Patient states that they are tight today. The normal issues.  Nothing severe overall.  Has been trying to be more active recently.  Medications patient has been prescribed:   Taking:         Reviewed prior external information including notes and imaging from previsou exam, outside providers and external EMR if available.   As well as notes that were available from care everywhere and other healthcare systems.  Past medical history, social, surgical and family history all reviewed in electronic medical record.  No pertanent information unless stated regarding to the chief complaint.   Past Medical History:  Diagnosis Date   Acute cerebrovascular accident (CVA) of medulla oblongata (HCC) 09/09/2020   a.) small acute infarct of the lateral LEFT medulla in setting of LEFT vertebral artery dissection.   Anxiety    ASD (atrial septal defect) 09/14/2020   a.) TTE 09/14/2020: EF 65-70%; (+) IAS noted on color flow Doppler.   GERD (gastroesophageal reflux disease)    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    Allergies[1]   Review of Systems:  No headache, visual changes, nausea, vomiting, diarrhea, constipation, dizziness, abdominal pain, skin rash, fevers, chills, night sweats, weight loss, swollen lymph nodes, body aches, joint swelling, chest pain, shortness of breath, mood changes. POSITIVE muscle aches  Objective  Blood pressure 122/80, pulse 65, height 6' (1.829 [name]), weight 295 lb (133.8 kg), SpO2 95%.   General: No apparent distress alert and oriented x3 mood and affect normal, dressed  appropriately.  HEENT: Pupils equal, extraocular movements intact  Respiratory: Patient's speak in full sentences and does not appear short of breath  Cardiovascular: No lower extremity edema, non tender, no erythema  Gait MSK:  Back does have some loss lordosis noted.  Some tenderness to palpation to the more in the thoracic spine.  Osteopathic findings T4 extended rotated and side bent right T6 extended rotated and side bent left T9 extended rotated and side bent left L2 flexed rotated and side bent right Sacrum right on right       Assessment and Plan:  Thoracic back pain Multifactorial, continuing to try to lose weight but has had some difficulty over the holiday season.  Patient has actually gained some of the weight back.  Discussed which activities to Curtis [name] and which ones to avoid.  Increase activity slowly.  Discussed icing regimen.  Follow-up again in 6 to 12 weeks.    Nonallopathic problems  Decision today to treat with OMT was based on Physical Exam  After verbal consent patient was treated with HVLA, ME, FPR techniques in , thoracic, lumbar, and sacral  areas  Patient tolerated the procedure well with improvement in symptoms  Patient given exercises, stretches and lifestyle modifications  See medications in patient instructions if given  Patient will follow up in 4-8 weeks    The above documentation has been reviewed and is accurate and complete Curtis [name] Curtis Hnat, Curtis [name]          Note: This dictation was prepared with Dragon dictation along with smaller phrase technology. Any transcriptional  errors that result from this process are unintentional.            [1] No Known Allergies  "

## 2024-02-18 ENCOUNTER — Encounter: Payer: Self-pay | Admitting: Family Medicine

## 2024-02-18 ENCOUNTER — Ambulatory Visit: Admitting: Family Medicine

## 2024-02-18 VITALS — BP 122/80 | HR 65 | Ht 72.0 in | Wt 295.0 lb

## 2024-02-18 DIAGNOSIS — M9902 Segmental and somatic dysfunction of thoracic region: Secondary | ICD-10-CM

## 2024-02-18 DIAGNOSIS — M9903 Segmental and somatic dysfunction of lumbar region: Secondary | ICD-10-CM

## 2024-02-18 DIAGNOSIS — M546 Pain in thoracic spine: Secondary | ICD-10-CM | POA: Diagnosis not present

## 2024-02-18 DIAGNOSIS — M9904 Segmental and somatic dysfunction of sacral region: Secondary | ICD-10-CM

## 2024-02-18 DIAGNOSIS — G8929 Other chronic pain: Secondary | ICD-10-CM | POA: Diagnosis not present

## 2024-02-18 NOTE — Patient Instructions (Signed)
 See me in 6-8 weeks

## 2024-02-18 NOTE — Assessment & Plan Note (Signed)
 Multifactorial, continuing to try to lose weight but has had some difficulty over the holiday season.  Patient has actually gained some of the weight back.  Discussed which activities to do and which ones to avoid.  Increase activity slowly.  Discussed icing regimen.  Follow-up again in 6 to 12 weeks.

## 2024-04-12 ENCOUNTER — Ambulatory Visit: Admitting: Family Medicine

## 2024-05-10 ENCOUNTER — Ambulatory Visit: Admitting: Nurse Practitioner
